# Patient Record
Sex: Female | Born: 1955 | Race: Black or African American | Hispanic: No | Marital: Single | Smoking: Current every day smoker
Health system: Southern US, Community
[De-identification: ages and names within clinical notes are randomized; demographics above are authoritative.]

## PROBLEM LIST (undated history)

## (undated) ENCOUNTER — Emergency Department (HOSPITAL_COMMUNITY): Admission: EM | Payer: Self-pay | Source: Home / Self Care

## (undated) DIAGNOSIS — M1990 Unspecified osteoarthritis, unspecified site: Secondary | ICD-10-CM

## (undated) DIAGNOSIS — J449 Chronic obstructive pulmonary disease, unspecified: Secondary | ICD-10-CM

## (undated) DIAGNOSIS — K219 Gastro-esophageal reflux disease without esophagitis: Secondary | ICD-10-CM

## (undated) DIAGNOSIS — F419 Anxiety disorder, unspecified: Secondary | ICD-10-CM

## (undated) DIAGNOSIS — K759 Inflammatory liver disease, unspecified: Secondary | ICD-10-CM

## (undated) DIAGNOSIS — I10 Essential (primary) hypertension: Secondary | ICD-10-CM

## (undated) DIAGNOSIS — F32A Depression, unspecified: Secondary | ICD-10-CM

## (undated) DIAGNOSIS — R0600 Dyspnea, unspecified: Secondary | ICD-10-CM

---

## 1997-12-06 ENCOUNTER — Emergency Department (HOSPITAL_COMMUNITY): Admission: EM | Admit: 1997-12-06 | Discharge: 1997-12-06 | Payer: Self-pay | Admitting: Emergency Medicine

## 1998-08-25 ENCOUNTER — Emergency Department (HOSPITAL_COMMUNITY): Admission: EM | Admit: 1998-08-25 | Discharge: 1998-08-25 | Payer: Self-pay | Admitting: Emergency Medicine

## 1998-11-17 ENCOUNTER — Encounter: Payer: Self-pay | Admitting: Emergency Medicine

## 1998-11-17 ENCOUNTER — Emergency Department (HOSPITAL_COMMUNITY): Admission: EM | Admit: 1998-11-17 | Discharge: 1998-11-17 | Payer: Self-pay | Admitting: Emergency Medicine

## 1999-02-14 ENCOUNTER — Other Ambulatory Visit: Admission: RE | Admit: 1999-02-14 | Discharge: 1999-02-14 | Payer: Self-pay | Admitting: Family Medicine

## 1999-07-02 ENCOUNTER — Encounter: Payer: Self-pay | Admitting: Emergency Medicine

## 1999-07-02 ENCOUNTER — Emergency Department (HOSPITAL_COMMUNITY): Admission: EM | Admit: 1999-07-02 | Discharge: 1999-07-02 | Payer: Self-pay | Admitting: Emergency Medicine

## 1999-07-29 ENCOUNTER — Emergency Department (HOSPITAL_COMMUNITY): Admission: EM | Admit: 1999-07-29 | Discharge: 1999-07-29 | Payer: Self-pay | Admitting: *Deleted

## 1999-12-12 ENCOUNTER — Encounter: Payer: Self-pay | Admitting: Emergency Medicine

## 1999-12-12 ENCOUNTER — Emergency Department (HOSPITAL_COMMUNITY): Admission: EM | Admit: 1999-12-12 | Discharge: 1999-12-12 | Payer: Self-pay | Admitting: Emergency Medicine

## 2000-06-21 ENCOUNTER — Emergency Department (HOSPITAL_COMMUNITY): Admission: EM | Admit: 2000-06-21 | Discharge: 2000-06-21 | Payer: Self-pay | Admitting: Emergency Medicine

## 2000-07-11 ENCOUNTER — Emergency Department (HOSPITAL_COMMUNITY): Admission: EM | Admit: 2000-07-11 | Discharge: 2000-07-11 | Payer: Self-pay | Admitting: Emergency Medicine

## 2000-12-20 ENCOUNTER — Encounter: Payer: Self-pay | Admitting: Emergency Medicine

## 2000-12-20 ENCOUNTER — Emergency Department (HOSPITAL_COMMUNITY): Admission: EM | Admit: 2000-12-20 | Discharge: 2000-12-20 | Payer: Self-pay | Admitting: Emergency Medicine

## 2002-04-13 ENCOUNTER — Emergency Department (HOSPITAL_COMMUNITY): Admission: EM | Admit: 2002-04-13 | Discharge: 2002-04-13 | Payer: Self-pay | Admitting: Emergency Medicine

## 2002-09-14 ENCOUNTER — Emergency Department (HOSPITAL_COMMUNITY): Admission: EM | Admit: 2002-09-14 | Discharge: 2002-09-14 | Payer: Self-pay | Admitting: Emergency Medicine

## 2002-09-14 ENCOUNTER — Encounter: Payer: Self-pay | Admitting: Emergency Medicine

## 2003-05-20 ENCOUNTER — Emergency Department (HOSPITAL_COMMUNITY): Admission: AD | Admit: 2003-05-20 | Discharge: 2003-05-20 | Payer: Self-pay | Admitting: Family Medicine

## 2003-06-08 ENCOUNTER — Emergency Department (HOSPITAL_COMMUNITY): Admission: AD | Admit: 2003-06-08 | Discharge: 2003-06-08 | Payer: Self-pay | Admitting: Family Medicine

## 2004-05-12 ENCOUNTER — Emergency Department (HOSPITAL_COMMUNITY): Admission: EM | Admit: 2004-05-12 | Discharge: 2004-05-12 | Payer: Self-pay | Admitting: Emergency Medicine

## 2004-05-14 ENCOUNTER — Emergency Department (HOSPITAL_COMMUNITY): Admission: EM | Admit: 2004-05-14 | Discharge: 2004-05-14 | Payer: Self-pay | Admitting: Family Medicine

## 2004-05-14 ENCOUNTER — Ambulatory Visit: Payer: Self-pay | Admitting: Nurse Practitioner

## 2004-08-20 ENCOUNTER — Ambulatory Visit: Payer: Self-pay | Admitting: Nurse Practitioner

## 2004-08-26 ENCOUNTER — Ambulatory Visit: Payer: Self-pay | Admitting: *Deleted

## 2004-08-26 ENCOUNTER — Ambulatory Visit: Payer: Self-pay | Admitting: Nurse Practitioner

## 2004-10-14 ENCOUNTER — Ambulatory Visit: Payer: Self-pay | Admitting: Nurse Practitioner

## 2004-11-21 ENCOUNTER — Ambulatory Visit: Payer: Self-pay | Admitting: Nurse Practitioner

## 2004-11-28 ENCOUNTER — Ambulatory Visit (HOSPITAL_COMMUNITY): Admission: RE | Admit: 2004-11-28 | Discharge: 2004-11-28 | Payer: Self-pay | Admitting: Family Medicine

## 2004-12-06 ENCOUNTER — Ambulatory Visit: Payer: Self-pay | Admitting: Nurse Practitioner

## 2005-05-15 ENCOUNTER — Ambulatory Visit: Payer: Self-pay | Admitting: Nurse Practitioner

## 2006-01-21 ENCOUNTER — Ambulatory Visit: Payer: Self-pay | Admitting: Nurse Practitioner

## 2006-01-30 ENCOUNTER — Ambulatory Visit (HOSPITAL_COMMUNITY): Admission: RE | Admit: 2006-01-30 | Discharge: 2006-01-30 | Payer: Self-pay | Admitting: Family Medicine

## 2006-03-03 ENCOUNTER — Ambulatory Visit: Payer: Self-pay | Admitting: Nurse Practitioner

## 2006-03-04 ENCOUNTER — Ambulatory Visit: Payer: Self-pay | Admitting: Nurse Practitioner

## 2006-10-27 ENCOUNTER — Ambulatory Visit: Payer: Self-pay | Admitting: Nurse Practitioner

## 2006-10-27 ENCOUNTER — Encounter (INDEPENDENT_AMBULATORY_CARE_PROVIDER_SITE_OTHER): Payer: Self-pay | Admitting: Nurse Practitioner

## 2006-12-04 ENCOUNTER — Ambulatory Visit: Payer: Self-pay | Admitting: Nurse Practitioner

## 2006-12-07 ENCOUNTER — Ambulatory Visit: Payer: Self-pay | Admitting: Internal Medicine

## 2006-12-25 ENCOUNTER — Ambulatory Visit: Payer: Self-pay | Admitting: Nurse Practitioner

## 2007-02-01 ENCOUNTER — Ambulatory Visit (HOSPITAL_COMMUNITY): Admission: RE | Admit: 2007-02-01 | Discharge: 2007-02-01 | Payer: Self-pay | Admitting: Family Medicine

## 2007-02-11 ENCOUNTER — Encounter: Admission: RE | Admit: 2007-02-11 | Discharge: 2007-02-11 | Payer: Self-pay | Admitting: Family Medicine

## 2007-03-24 ENCOUNTER — Encounter (INDEPENDENT_AMBULATORY_CARE_PROVIDER_SITE_OTHER): Payer: Self-pay | Admitting: *Deleted

## 2007-07-07 ENCOUNTER — Emergency Department (HOSPITAL_COMMUNITY): Admission: EM | Admit: 2007-07-07 | Discharge: 2007-07-07 | Payer: Self-pay | Admitting: *Deleted

## 2007-07-29 ENCOUNTER — Encounter (INDEPENDENT_AMBULATORY_CARE_PROVIDER_SITE_OTHER): Payer: Self-pay | Admitting: Nurse Practitioner

## 2007-07-29 ENCOUNTER — Ambulatory Visit: Payer: Self-pay | Admitting: Internal Medicine

## 2007-07-29 LAB — CONVERTED CEMR LAB
BUN: 17 mg/dL (ref 6–23)
CO2: 25 meq/L (ref 19–32)
Calcium: 9.5 mg/dL (ref 8.4–10.5)
Chloride: 107 meq/L (ref 96–112)
Creatinine, Ser: 0.88 mg/dL (ref 0.40–1.20)
HDL: 67 mg/dL (ref >39)
LDL Cholesterol: 99 mg/dL (ref 0–99)
Potassium: 4.6 meq/L (ref 3.5–5.3)
Total CHOL/HDL Ratio: 2.8
Triglycerides: 94 mg/dL (ref <150)

## 2007-10-06 ENCOUNTER — Ambulatory Visit: Payer: Self-pay | Admitting: Internal Medicine

## 2007-10-06 ENCOUNTER — Encounter (INDEPENDENT_AMBULATORY_CARE_PROVIDER_SITE_OTHER): Payer: Self-pay | Admitting: Nurse Practitioner

## 2007-10-06 LAB — CONVERTED CEMR LAB
ALT: 15 units/L (ref 0–35)
Albumin: 4.8 g/dL (ref 3.5–5.2)
Alkaline Phosphatase: 70 units/L (ref 39–117)
BUN: 19 mg/dL (ref 6–23)
Calcium: 9.6 mg/dL (ref 8.4–10.5)
Creatinine, Ser: 0.98 mg/dL (ref 0.40–1.20)
HDL: 75 mg/dL (ref >39)
LDL Cholesterol: 101 mg/dL — ABNORMAL HIGH (ref 0–99)
Potassium: 4.3 meq/L (ref 3.5–5.3)
Sodium: 139 meq/L (ref 135–145)
Total Bilirubin: 0.4 mg/dL (ref 0.3–1.2)
Triglycerides: 144 mg/dL (ref <150)
VLDL: 29 mg/dL (ref 0–40)

## 2008-07-06 ENCOUNTER — Ambulatory Visit: Payer: Self-pay | Admitting: Internal Medicine

## 2008-07-06 ENCOUNTER — Encounter (INDEPENDENT_AMBULATORY_CARE_PROVIDER_SITE_OTHER): Payer: Self-pay | Admitting: Internal Medicine

## 2008-07-06 LAB — CONVERTED CEMR LAB
ALT: 13 units/L (ref 0–35)
BUN: 16 mg/dL (ref 6–23)
Chloride: 106 meq/L (ref 96–112)
Cholesterol: 183 mg/dL (ref 0–200)
Potassium: 3.6 meq/L (ref 3.5–5.3)
Sodium: 142 meq/L (ref 135–145)
Total CHOL/HDL Ratio: 3
Total Protein: 7.2 g/dL (ref 6.0–8.3)
VLDL: 37 mg/dL (ref 0–40)

## 2008-07-14 ENCOUNTER — Ambulatory Visit (HOSPITAL_COMMUNITY): Admission: RE | Admit: 2008-07-14 | Discharge: 2008-07-14 | Payer: Self-pay | Admitting: Family Medicine

## 2009-03-08 ENCOUNTER — Telehealth (INDEPENDENT_AMBULATORY_CARE_PROVIDER_SITE_OTHER): Payer: Self-pay | Admitting: *Deleted

## 2009-03-09 ENCOUNTER — Ambulatory Visit: Payer: Self-pay | Admitting: Internal Medicine

## 2009-04-27 ENCOUNTER — Ambulatory Visit: Payer: Self-pay | Admitting: Internal Medicine

## 2009-05-09 ENCOUNTER — Ambulatory Visit: Payer: Self-pay | Admitting: Family Medicine

## 2009-05-23 ENCOUNTER — Ambulatory Visit: Payer: Self-pay | Admitting: Internal Medicine

## 2009-05-24 ENCOUNTER — Ambulatory Visit (HOSPITAL_COMMUNITY): Admission: RE | Admit: 2009-05-24 | Discharge: 2009-05-24 | Payer: Self-pay | Admitting: Family Medicine

## 2009-08-02 ENCOUNTER — Ambulatory Visit: Payer: Self-pay | Admitting: Internal Medicine

## 2009-11-13 ENCOUNTER — Ambulatory Visit: Payer: Self-pay | Admitting: Internal Medicine

## 2009-11-13 LAB — CONVERTED CEMR LAB
BUN: 24 mg/dL — ABNORMAL HIGH (ref 6–23)
Calcium: 9.9 mg/dL (ref 8.4–10.5)
Chloride: 103 meq/L (ref 96–112)
Creatinine, Ser: 1.08 mg/dL (ref 0.40–1.20)
Potassium: 4.2 meq/L (ref 3.5–5.3)
Sodium: 137 meq/L (ref 135–145)

## 2009-11-22 ENCOUNTER — Ambulatory Visit: Payer: Self-pay | Admitting: Internal Medicine

## 2010-01-04 ENCOUNTER — Ambulatory Visit: Payer: Self-pay | Admitting: Family Medicine

## 2010-01-21 ENCOUNTER — Ambulatory Visit: Payer: Self-pay | Admitting: Internal Medicine

## 2010-02-12 ENCOUNTER — Ambulatory Visit: Payer: Self-pay | Admitting: Internal Medicine

## 2010-07-28 ENCOUNTER — Encounter: Payer: Self-pay | Admitting: Internal Medicine
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
## 2012-01-03 ENCOUNTER — Encounter (HOSPITAL_COMMUNITY): Payer: Self-pay | Admitting: Family Medicine

## 2012-01-03 ENCOUNTER — Emergency Department (HOSPITAL_COMMUNITY)
Admission: EM | Admit: 2012-01-03 | Discharge: 2012-01-03 | Disposition: A | Discharge status: Home / Self Care | Payer: Self-pay | Attending: Emergency Medicine | Admitting: Emergency Medicine

## 2012-01-03 DIAGNOSIS — M549 Dorsalgia, unspecified: Secondary | ICD-10-CM

## 2012-01-03 DIAGNOSIS — M545 Low back pain, unspecified: Secondary | ICD-10-CM | POA: Insufficient documentation

## 2012-01-03 DIAGNOSIS — Z79899 Other long term (current) drug therapy: Secondary | ICD-10-CM | POA: Insufficient documentation

## 2012-01-03 DIAGNOSIS — X500XXA Overexertion from strenuous movement or load, initial encounter: Secondary | ICD-10-CM | POA: Insufficient documentation

## 2012-01-03 DIAGNOSIS — R350 Frequency of micturition: Secondary | ICD-10-CM | POA: Insufficient documentation

## 2012-01-03 DIAGNOSIS — F17200 Nicotine dependence, unspecified, uncomplicated: Secondary | ICD-10-CM | POA: Insufficient documentation

## 2012-01-03 DIAGNOSIS — K219 Gastro-esophageal reflux disease without esophagitis: Secondary | ICD-10-CM | POA: Insufficient documentation

## 2012-01-03 DIAGNOSIS — I10 Essential (primary) hypertension: Secondary | ICD-10-CM | POA: Insufficient documentation

## 2012-01-03 HISTORY — DX: Essential (primary) hypertension: I10

## 2012-01-03 HISTORY — DX: Gastro-esophageal reflux disease without esophagitis: K21.9

## 2012-01-03 LAB — URINALYSIS, ROUTINE W REFLEX MICROSCOPIC
Bilirubin Urine: NEGATIVE
Glucose, UA: NEGATIVE mg/dL
Hgb urine dipstick: NEGATIVE
Ketones, ur: NEGATIVE mg/dL
Nitrite: NEGATIVE
Protein, ur: NEGATIVE mg/dL
Specific Gravity, Urine: 1.016 (ref 1.005–1.030)
Urobilinogen, UA: 0.2 mg/dL (ref 0.0–1.0)
pH: 5 (ref 5.0–8.0)

## 2012-01-03 LAB — URINE MICROSCOPIC-ADD ON

## 2012-01-03 MED ORDER — TRAMADOL HCL 50 MG PO TABS: 50.0000 mg | ORAL_TABLET | Freq: Four times a day (QID) | ORAL | Status: DC | PRN

## 2012-01-03 MED ORDER — TRAMADOL HCL 50 MG PO TABS
50.0000 mg | ORAL_TABLET | Freq: Once | ORAL | Status: AC
  Administered 2012-01-03: 50 mg via ORAL
  Filled 2012-01-03: qty 1

## 2012-01-03 NOTE — Discharge Instructions (Signed)
Back Pain, Adult Low back pain is very common. About 1 in 5 people have back pain.The cause of low back pain is rarely dangerous. The pain often gets better over time.About half of people with a sudden onset of back pain feel better in just 2 weeks. About 8 in 10 people feel better by 6 weeks.  CAUSES Some common causes of back pain include:  Strain of the muscles or ligaments supporting the spine.   Wear and tear (degeneration) of the spinal discs.   Arthritis.   Direct injury to the back.  DIAGNOSIS Most of the time, the direct cause of low back pain is not known.However, back pain can be treated effectively even when the exact cause of the pain is unknown.Answering your caregiver's questions about your overall health and symptoms is one of the most accurate ways to make sure the cause of your pain is not dangerous. If your caregiver needs more information, he or she may order lab work or imaging tests (X-rays or MRIs).However, even if imaging tests show changes in your back, this usually does not require surgery. HOME CARE INSTRUCTIONS For many people, back pain returns.Since low back pain is rarely dangerous, it is often a condition that people can learn to manageon their own.   Remain active. It is stressful on the back to sit or stand in one place. Do not sit, drive, or stand in one place for more than 30 minutes at a time. Take short walks on level surfaces as soon as pain allows.Try to increase the length of time you walk each day.   Do not stay in bed.Resting more than 1 or 2 days can delay your recovery.   Do not avoid exercise or work.Your body is made to move.It is not dangerous to be active, even though your back may hurt.Your back will likely heal faster if you return to being active before your pain is gone.   Pay attention to your body when you bend and lift. Many people have less discomfortwhen lifting if they bend their knees, keep the load close to their  bodies,and avoid twisting. Often, the most comfortable positions are those that put less stress on your recovering back.   Find a comfortable position to sleep. Use a firm mattress and lie on your side with your knees slightly bent. If you lie on your back, put a pillow under your knees.   Only take over-the-counter or prescription medicines as directed by your caregiver. Over-the-counter medicines to reduce pain and inflammation are often the most helpful.Your caregiver may prescribe muscle relaxant drugs.These medicines help dull your pain so you can more quickly return to your normal activities and healthy exercise.   Put ice on the injured area.   Put ice in a plastic bag.   Place a towel between your skin and the bag.   Leave the ice on for 15 to 20 minutes, 3 to 4 times a day for the first 2 to 3 days. After that, ice and heat may be alternated to reduce pain and spasms.   Ask your caregiver about trying back exercises and gentle massage. This may be of some benefit.   Avoid feeling anxious or stressed.Stress increases muscle tension and can worsen back pain.It is important to recognize when you are anxious or stressed and learn ways to manage it.Exercise is a great option.  SEEK MEDICAL CARE IF:  You have pain that is not relieved with rest or medicine.   You have   pain that does not improve in 1 week.   You have new symptoms.   You are generally not feeling well.  SEEK IMMEDIATE MEDICAL CARE IF:   You have pain that radiates from your back into your legs.   You develop new bowel or bladder control problems.   You have unusual weakness or numbness in your arms or legs.   You develop nausea or vomiting.   You develop abdominal pain.   You feel faint.  Document Released: 06/23/2005 Document Revised: 06/12/2011 Document Reviewed: 11/11/2010 [employer] Patient Information 2012 [employer]. 

## 2012-01-03 NOTE — ED Notes (Signed)
Per pt left sided back pain and muscle spasm x1 week. sts after pushing grandchild in stroller.

## 2012-01-03 NOTE — ED Provider Notes (Addendum)
History    This chart was scribed for Anna Mcguire. Anna Lamas, MD, MD by Smitty Pluck. The patient was seen in room TR07C and the patient's care was started at 5:44PM.   CSN: [id number]  Arrival date & time 01/03/12  1723   None     Chief Complaint  Patient presents with  . Spasms    (Consider location/radiation/quality/duration/timing/severity/associated sxs/prior treatment) The history is provided by the patient.   Anna Mcguire is a 56 y.o. female who presents to the Emergency Department complaining of moderate left side back pain onset 8 days ago. Pt reports taking tramadol without relief. Pt reports that she takes 1/day for other pain. Pt reports that she has had increased urinary frequency. Pt reports that she was pushing her grandchildren in stroller before symptoms started about 1 week ago. Symptoms have been constant since onset without radiation. She endorses some urinary freq, but no dysuria.  No abd pain.  No weakness or dizziness.  Gradual onset of pain.    Past Medical History  Diagnosis Date  . Hypertension   . Acid reflux     History reviewed. No pertinent past surgical history.  History reviewed. No pertinent family history.  History  Substance Use Topics  . Smoking status: Current Everyday Smoker  . Smokeless tobacco: Not on file  . Alcohol Use: Yes    OB History    Grav Para Term Preterm Abortions TAB SAB Ect Mult Living                  Review of Systems  Constitutional: Negative for fever and chills.  Respiratory: Negative for cough and shortness of breath.   Gastrointestinal: Negative for nausea, vomiting and diarrhea.  Genitourinary: Positive for frequency. Negative for dysuria and urgency.  Musculoskeletal: Positive for back pain. Negative for gait problem.  Skin: Negative for rash.  Neurological: Negative for weakness, numbness and headaches.    Allergies  Review of patient's allergies indicates no known allergies.  Home Medications   Current  Outpatient Rx  Name Route Sig Dispense Refill  . HYDROCHLOROTHIAZIDE 25 MG PO TABS Oral Take 25 mg by mouth daily.    Marland Kitchen LORATADINE 10 MG PO TABS Oral Take 10 mg by mouth daily.    . MELOXICAM 15 MG PO TABS Oral Take 15 mg by mouth daily.    Marland Kitchen PANTOPRAZOLE SODIUM 40 MG PO TBEC Oral Take 40 mg by mouth daily.    . TRAMADOL HCL 50 MG PO TABS Oral Take 50 mg by mouth every 6 (six) hours as needed. For pain    . TRAMADOL HCL 50 MG PO TABS Oral Take 1 tablet (50 mg total) by mouth every 6 (six) hours as needed for pain. 15 tablet 0    BP 143/83  Pulse 89  Temp 98.5 F (36.9 C) (Oral)  Resp 17  SpO2 100%  Physical Exam  Nursing note and vitals reviewed. Constitutional: She is oriented to person, place, and time. She appears well-developed and well-nourished. No distress.  HENT:  Head: Normocephalic and atraumatic.  Eyes: EOM are normal. Pupils are equal, round, and reactive to light.  Neck: Normal range of motion. Neck supple.  Pulmonary/Chest: Effort normal. No respiratory distress.  Abdominal: Soft. She exhibits no distension. There is no tenderness.  Musculoskeletal: Normal range of motion. She exhibits tenderness ( right lower back).       Lumbar back: She exhibits pain. She exhibits normal range of motion, no bony tenderness, no deformity, no  laceration, no spasm and normal pulse.       No spasm +1 reflexes in knees bilaterally  Nl gait    Neurological: She is alert and oriented to person, place, and time. She has normal strength. Gait normal.  Reflex Scores:      Patellar reflexes are 1+ on the right side and 1+ on the left side. Skin: Skin is warm and dry. No rash noted.  Psychiatric: She has a normal mood and affect. Her behavior is normal.    ED Course  Procedures (including critical care time) DIAGNOSTIC STUDIES: Oxygen Saturation is 100% on room air, normal by my interpretation.    COORDINATION OF CARE: 5:49PM EDP discusses pt ED treatment with pt.  5:49PM EDP  orders medication: ultram 50 mg  Labs Reviewed  URINALYSIS, ROUTINE W REFLEX MICROSCOPIC - Abnormal; Notable for the following:    APPearance CLOUDY (*)     Leukocytes, UA TRACE (*)     All other components within normal limits  URINE MICROSCOPIC-ADD ON - Abnormal; Notable for the following:    Squamous Epithelial / LPF FEW (*)     All other components within normal limits   No results found.   1. Back pain     6:41 PM UA shows no evidence of UTI.  MDM  I personally performed the services described in this documentation, which was scribed in my presence. The recorded information has been reviewed and considered.        Anna Mcguire. Anna Lamas, MD 01/03/12 1823  Anna Mcguire. Anna Lamas, MD 01/03/12 1610

## 2012-01-07 ENCOUNTER — Emergency Department (HOSPITAL_COMMUNITY)
Admission: EM | Admit: 2012-01-07 | Discharge: 2012-01-07 | Disposition: A | Discharge status: Home / Self Care | Payer: Self-pay | Attending: Emergency Medicine | Admitting: Emergency Medicine

## 2012-01-07 ENCOUNTER — Encounter (HOSPITAL_COMMUNITY): Payer: Self-pay

## 2012-01-07 ENCOUNTER — Emergency Department (HOSPITAL_COMMUNITY): Payer: Self-pay

## 2012-01-07 DIAGNOSIS — F17200 Nicotine dependence, unspecified, uncomplicated: Secondary | ICD-10-CM | POA: Insufficient documentation

## 2012-01-07 DIAGNOSIS — K219 Gastro-esophageal reflux disease without esophagitis: Secondary | ICD-10-CM | POA: Insufficient documentation

## 2012-01-07 DIAGNOSIS — I10 Essential (primary) hypertension: Secondary | ICD-10-CM | POA: Insufficient documentation

## 2012-01-07 DIAGNOSIS — M62838 Other muscle spasm: Secondary | ICD-10-CM | POA: Insufficient documentation

## 2012-01-07 DIAGNOSIS — R109 Unspecified abdominal pain: Secondary | ICD-10-CM | POA: Insufficient documentation

## 2012-01-07 LAB — CBC WITH DIFFERENTIAL/PLATELET
Basophils Absolute: 0 10*3/uL (ref 0.0–0.1)
Basophils Relative: 1 % (ref 0–1)
Eosinophils Absolute: 0.2 10*3/uL (ref 0.0–0.7)
Eosinophils Relative: 3 % (ref 0–5)
HCT: 36.6 % (ref 36.0–46.0)
Hemoglobin: 12.8 g/dL (ref 12.0–15.0)
Lymphs Abs: 2.7 10*3/uL (ref 0.7–4.0)
MCH: 32.4 pg (ref 26.0–34.0)
MCV: 92.7 fL (ref 78.0–100.0)
Monocytes Relative: 7 % (ref 3–12)
Neutro Abs: 3 10*3/uL (ref 1.7–7.7)
RBC: 3.95 MIL/uL (ref 3.87–5.11)
WBC: 6.3 10*3/uL (ref 4.0–10.5)

## 2012-01-07 LAB — POCT I-STAT, CHEM 8
BUN: 22 mg/dL (ref 6–23)
Calcium, Ion: 1.39 mmol/L — ABNORMAL HIGH (ref 1.12–1.32)
Glucose, Bld: 83 mg/dL (ref 70–99)
HCT: 40 % (ref 36.0–46.0)
TCO2: 28 mmol/L (ref 0–100)

## 2012-01-07 LAB — URINALYSIS, ROUTINE W REFLEX MICROSCOPIC
Glucose, UA: NEGATIVE mg/dL
Leukocytes, UA: NEGATIVE
Nitrite: NEGATIVE
Specific Gravity, Urine: 1.011 (ref 1.005–1.030)
pH: 5 (ref 5.0–8.0)

## 2012-01-07 MED ORDER — HYDROCODONE-ACETAMINOPHEN 5-325 MG PO TABS: 1.0000 | ORAL_TABLET | Freq: Four times a day (QID) | ORAL | Status: AC | PRN

## 2012-01-07 MED ORDER — OXYCODONE-ACETAMINOPHEN 5-325 MG PO TABS: 1.0000 | ORAL_TABLET | Freq: Once | ORAL | Status: DC

## 2012-01-07 MED ORDER — CYCLOBENZAPRINE HCL 10 MG PO TABS: 10.0000 mg | ORAL_TABLET | Freq: Two times a day (BID) | ORAL | Status: AC | PRN

## 2012-01-07 MED ORDER — HYDROCODONE-ACETAMINOPHEN 5-325 MG PO TABS
1.0000 | ORAL_TABLET | Freq: Once | ORAL | Status: AC
  Administered 2012-01-07: 1 via ORAL
  Filled 2012-01-07: qty 1

## 2012-01-07 NOTE — ED Notes (Signed)
Pt continues to complain of pain to the right side. Pt A/A/Ox4, skin is warm and dry, respiration is even and unlabored.

## 2012-01-07 NOTE — ED Notes (Signed)
Pt presents with 2 week h/o R flank pain.  Pt reports pain has been constant and does not radiate.  Pt reports urinary frequency, denies any dysuria.  Pt seen here for same last week.

## 2012-01-07 NOTE — ED Provider Notes (Signed)
History     CSN: [id number]  Arrival date & time 01/07/12  1259   First MD Initiated Contact with Patient 01/07/12 1646      Chief Complaint  Patient presents with  . Flank Pain    (Consider location/radiation/quality/duration/timing/severity/associated sxs/prior treatment) HPI Comments: Patient is a current everyday smoker with a significant past medical history for hypertension that presents emergency department chief complaint of right-sided flank pain.  Onset of symptoms were 2 weeks ago and patient was evaluated last week for same.  Patient has been taking tramadol, however she states that this has not helped.  In addition she has used cold and heat therapy which hurts as well.  Pain is described as sharp and constant.  Patient is followed by Dr. Andrey Campanile.  Patient denies any cough, fever, night sweats, chills, shortness of breath, chest pain, abdominal pain, and but does report urinary frequency  Patient is a 56 y.o. female presenting with flank pain. The history is provided by the patient.  Flank Pain Pertinent negatives include no abdominal pain, chest pain, chills, congestion, fever, headaches, numbness or weakness.    Past Medical History  Diagnosis Date  . Hypertension   . Acid reflux     History reviewed. No pertinent past surgical history.  No family history on file.  History  Substance Use Topics  . Smoking status: Current Everyday Smoker  . Smokeless tobacco: Not on file  . Alcohol Use: Yes    OB History    Grav Para Term Preterm Abortions TAB SAB Ect Mult Living                  Review of Systems  Constitutional: Negative for fever, chills and appetite change.  HENT: Negative for congestion.   Eyes: Negative for visual disturbance.  Respiratory: Negative for shortness of breath.   Cardiovascular: Negative for chest pain and leg swelling.  Gastrointestinal: Negative for abdominal pain.  Genitourinary: Positive for flank pain. Negative for dysuria,  urgency and frequency.  Neurological: Negative for dizziness, syncope, weakness, light-headedness, numbness and headaches.  Psychiatric/Behavioral: Negative for confusion.  All other systems reviewed and are negative.    Allergies  Review of patient's allergies indicates no known allergies.  Home Medications   Current Outpatient Rx  Name Route Sig Dispense Refill  . HYDROCHLOROTHIAZIDE 25 MG PO TABS Oral Take 25 mg by mouth daily.    Marland Kitchen LORATADINE 10 MG PO TABS Oral Take 10 mg by mouth daily.    . MELOXICAM 15 MG PO TABS Oral Take 15 mg by mouth daily.    Marland Kitchen PANTOPRAZOLE SODIUM 40 MG PO TBEC Oral Take 40 mg by mouth daily.    . TRAMADOL HCL 50 MG PO TABS Oral Take 50 mg by mouth 3 (three) times daily as needed. For pain      BP 160/92  Pulse 78  Temp 98.6 F (37 C) (Oral)  Resp 16  SpO2 100%  Physical Exam  Constitutional: She is oriented to person, place, and time. She appears well-developed and well-nourished. No distress.  HENT:  Head: Normocephalic and atraumatic.  Mouth/Throat: Oropharynx is clear and moist. No oropharyngeal exudate.  Eyes: Conjunctivae and EOM are normal. Pupils are equal, round, and reactive to light. No scleral icterus.  Neck: Normal range of motion. Neck supple. No tracheal deviation present. No thyromegaly present.  Cardiovascular: Normal rate, regular rhythm, normal heart sounds and intact distal pulses.   Pulmonary/Chest: Effort normal and breath sounds normal. No stridor. No  respiratory distress. She has no wheezes.  Abdominal: Soft.  Musculoskeletal: Normal range of motion. She exhibits tenderness. She exhibits no edema.       Arms: Neurological: She is alert and oriented to person, place, and time. Coordination normal.  Skin: Skin is warm and dry. No rash noted. She is not diaphoretic. No erythema. No pallor.  Psychiatric: She has a normal mood and affect. Her behavior is normal.    ED Course  Procedures (including critical care  time)  Labs Reviewed  POCT I-STAT, CHEM 8 - Abnormal; Notable for the following:    Creatinine, Ser 1.30 (*)     Calcium, Ion 1.39 (*)     All other components within normal limits  URINALYSIS, ROUTINE W REFLEX MICROSCOPIC  CBC WITH DIFFERENTIAL   Ct Abdomen Pelvis Wo Contrast  01/07/2012  *RADIOLOGY REPORT*  Clinical Data: Right flank pain for past week becoming worse.  No hematuria.  CT ABDOMEN AND PELVIS WITHOUT CONTRAST  Technique:  Multidetector CT imaging of the abdomen and pelvis was performed following the standard protocol without intravenous contrast.  Comparison: None.  Findings: No renal or ureteral calculi or findings to suggest renal collecting system obstruction.  Unenhanced imaging without renal mass or bladder mass identified.  No extra luminal bowel inflammatory process, free fluid or free air.  The stomach is under distended and evaluation limited.  Evaluation of solid abdominal viscera is limited by lack of IV contrast.  Taking this limitation into account no focal hepatic, on the splenic, pancreatic or adrenal mass.  No calcified gallstones.  Atherosclerotic type changes of the aorta and branch vessels with aortic ectasia.  Uterus tilted to the left.  No adenopathy.  Degenerative changes lumbar spine most notable L4-5 level.  Lung bases clear.  No bony destructive lesion.  IMPRESSION:  No renal or ureteral calculi or findings to suggest renal collecting system obstruction.  Unenhanced imaging without renal mass or bladder mass identified.  No extra luminal bowel inflammatory process, free fluid or free air.  The stomach is under distended and evaluation limited.  Atherosclerotic type changes of the aorta and branch vessels.  Degenerative changes lumbar spine most notable L4-5 level.  Original Report Authenticated By: Fuller Canada, M.D.     No diagnosis found.    MDM  musc pain, spasm   Patient given Flexeril and Norco for pain management.  Patient does not drive and is  aware that these medications will increase her fall risk. Advised patient to followup with primary care physician.  Labs and imaging reviewed.  Mild bump in patient's creatinine discussed.  Patient is agreeable with plan to followup with her PCP.  Patient verbalizes understanding        Jaci Carrel, [place] 01/07/12 1846

## 2012-01-07 NOTE — ED Notes (Signed)
Pt. Placed in gown and resting on bed.

## 2012-01-07 NOTE — ED Notes (Signed)
Complains of back pain after pushing grandson in stroller.  States "has tramadol but it has not provided relief of the pain".  Denies urinary symptoms.

## 2012-01-12 NOTE — ED Provider Notes (Signed)
Medical screening examination/treatment/procedure(s) were performed by non-physician practitioner and as supervising physician I was immediately available for consultation/collaboration.  Yailyn Strack, MD 01/12/12 0726 

## 2012-05-01 ENCOUNTER — Encounter (HOSPITAL_COMMUNITY): Payer: Self-pay | Admitting: Emergency Medicine

## 2012-05-01 DIAGNOSIS — Z8719 Personal history of other diseases of the digestive system: Secondary | ICD-10-CM | POA: Insufficient documentation

## 2012-05-01 DIAGNOSIS — F17200 Nicotine dependence, unspecified, uncomplicated: Secondary | ICD-10-CM | POA: Insufficient documentation

## 2012-05-01 DIAGNOSIS — I10 Essential (primary) hypertension: Secondary | ICD-10-CM | POA: Insufficient documentation

## 2012-05-01 DIAGNOSIS — Z79899 Other long term (current) drug therapy: Secondary | ICD-10-CM | POA: Insufficient documentation

## 2012-05-01 DIAGNOSIS — S0990XA Unspecified injury of head, initial encounter: Secondary | ICD-10-CM | POA: Insufficient documentation

## 2012-05-01 DIAGNOSIS — S0180XA Unspecified open wound of other part of head, initial encounter: Secondary | ICD-10-CM | POA: Insufficient documentation

## 2012-05-01 DIAGNOSIS — Z8739 Personal history of other diseases of the musculoskeletal system and connective tissue: Secondary | ICD-10-CM | POA: Insufficient documentation
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
## 2012-05-02 ENCOUNTER — Emergency Department (HOSPITAL_COMMUNITY)
Admission: EM | Admit: 2012-05-02 | Discharge: 2012-05-02 | Disposition: A | Discharge status: Home / Self Care | Payer: Self-pay | Attending: Emergency Medicine | Admitting: Emergency Medicine

## 2012-05-02 DIAGNOSIS — S0990XA Unspecified injury of head, initial encounter: Secondary | ICD-10-CM

## 2012-05-02 DIAGNOSIS — S0181XA Laceration without foreign body of other part of head, initial encounter: Secondary | ICD-10-CM

## 2012-05-02 HISTORY — DX: Unspecified osteoarthritis, unspecified site: M19.90
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
## 2012-05-10 NOTE — ED Provider Notes (Signed)
Medical screening examination/treatment/procedure(s) were performed by non-physician practitioner and as supervising physician I was immediately available for consultation/collaboration.   Gavin Pound. Oletta Lamas, MD 05/10/12 2022

## 2013-04-18 ENCOUNTER — Ambulatory Visit (HOSPITAL_COMMUNITY)
Admission: RE | Admit: 2013-04-18 | Discharge: 2013-04-18 | Disposition: A | Discharge status: Home / Self Care | Payer: No Typology Code available for payment source | Source: Ambulatory Visit | Attending: Internal Medicine | Admitting: Internal Medicine

## 2013-04-18 ENCOUNTER — Other Ambulatory Visit (HOSPITAL_COMMUNITY): Payer: Self-pay | Admitting: Internal Medicine

## 2013-04-18 DIAGNOSIS — R52 Pain, unspecified: Secondary | ICD-10-CM

## 2013-04-18 DIAGNOSIS — M25569 Pain in unspecified knee: Secondary | ICD-10-CM | POA: Insufficient documentation

## 2014-01-31 ENCOUNTER — Encounter (HOSPITAL_COMMUNITY): Payer: Self-pay | Admitting: Emergency Medicine

## 2014-01-31 ENCOUNTER — Emergency Department (HOSPITAL_COMMUNITY)
Admission: EM | Admit: 2014-01-31 | Discharge: 2014-01-31 | Disposition: A | Discharge status: Home / Self Care | Payer: No Typology Code available for payment source | Attending: Emergency Medicine | Admitting: Emergency Medicine

## 2014-01-31 DIAGNOSIS — IMO0002 Reserved for concepts with insufficient information to code with codable children: Secondary | ICD-10-CM | POA: Insufficient documentation

## 2014-01-31 DIAGNOSIS — Z792 Long term (current) use of antibiotics: Secondary | ICD-10-CM | POA: Insufficient documentation

## 2014-01-31 DIAGNOSIS — K219 Gastro-esophageal reflux disease without esophagitis: Secondary | ICD-10-CM | POA: Insufficient documentation

## 2014-01-31 DIAGNOSIS — Z791 Long term (current) use of non-steroidal anti-inflammatories (NSAID): Secondary | ICD-10-CM | POA: Insufficient documentation

## 2014-01-31 DIAGNOSIS — F17200 Nicotine dependence, unspecified, uncomplicated: Secondary | ICD-10-CM | POA: Insufficient documentation

## 2014-01-31 DIAGNOSIS — Z79899 Other long term (current) drug therapy: Secondary | ICD-10-CM | POA: Insufficient documentation

## 2014-01-31 DIAGNOSIS — N898 Other specified noninflammatory disorders of vagina: Secondary | ICD-10-CM | POA: Insufficient documentation

## 2014-01-31 DIAGNOSIS — M129 Arthropathy, unspecified: Secondary | ICD-10-CM | POA: Insufficient documentation

## 2014-01-31 DIAGNOSIS — I10 Essential (primary) hypertension: Secondary | ICD-10-CM | POA: Insufficient documentation

## 2014-01-31 LAB — URINALYSIS, ROUTINE W REFLEX MICROSCOPIC
Bilirubin Urine: NEGATIVE
Glucose, UA: NEGATIVE mg/dL
Hgb urine dipstick: NEGATIVE
KETONES UR: NEGATIVE mg/dL
LEUKOCYTES UA: NEGATIVE
NITRITE: NEGATIVE
PROTEIN: NEGATIVE mg/dL
Specific Gravity, Urine: 1.023 (ref 1.005–1.030)
Urobilinogen, UA: 0.2 mg/dL (ref 0.0–1.0)
pH: 5 (ref 5.0–8.0)

## 2014-01-31 LAB — BASIC METABOLIC PANEL
ANION GAP: 12 (ref 5–15)
BUN: 21 mg/dL (ref 6–23)
CALCIUM: 9.9 mg/dL (ref 8.4–10.5)
CO2: 24 mEq/L (ref 19–32)
Chloride: 105 mEq/L (ref 96–112)
Creatinine, Ser: 1.06 mg/dL (ref 0.50–1.10)
GFR calc Af Amer: 65 mL/min — ABNORMAL LOW (ref >90)
GFR, EST NON AFRICAN AMERICAN: 56 mL/min — AB (ref >90)
Glucose, Bld: 158 mg/dL — ABNORMAL HIGH (ref 70–99)
Potassium: 4.1 mEq/L (ref 3.7–5.3)
SODIUM: 141 meq/L (ref 137–147)

## 2014-01-31 LAB — CBC
HCT: 34.1 % — ABNORMAL LOW (ref 36.0–46.0)
Hemoglobin: 11.2 g/dL — ABNORMAL LOW (ref 12.0–15.0)
MCH: 31.3 pg (ref 26.0–34.0)
MCHC: 32.8 g/dL (ref 30.0–36.0)
MCV: 95.3 fL (ref 78.0–100.0)
PLATELETS: 292 10*3/uL (ref 150–400)
RBC: 3.58 MIL/uL — AB (ref 3.87–5.11)
RDW: 12.3 % (ref 11.5–15.5)
WBC: 3.6 10*3/uL — ABNORMAL LOW (ref 4.0–10.5)

## 2014-01-31 LAB — WET PREP, GENITAL
Trich, Wet Prep: NONE SEEN
Yeast Wet Prep HPF POC: NONE SEEN

## 2014-01-31 MED ORDER — CEFTRIAXONE SODIUM 250 MG IJ SOLR
250.0000 mg | Freq: Once | INTRAMUSCULAR | Status: AC
  Administered 2014-01-31: 250 mg via INTRAMUSCULAR
  Filled 2014-01-31: qty 250

## 2014-01-31 MED ORDER — DOXYCYCLINE HYCLATE 100 MG PO TABS
100.0000 mg | ORAL_TABLET | Freq: Once | ORAL | Status: AC
  Administered 2014-01-31: 100 mg via ORAL
  Filled 2014-01-31: qty 1

## 2014-01-31 MED ORDER — DOXYCYCLINE HYCLATE 100 MG PO CAPS: 100.0000 mg | ORAL_CAPSULE | Freq: Two times a day (BID) | ORAL | Status: DC

## 2014-01-31 NOTE — ED Provider Notes (Signed)
CSN: [id number]     Arrival date & time 01/31/14  1255 History   First MD Initiated Contact with Patient 01/31/14 1700     Chief Complaint  Patient presents with  . Vaginal Bleeding     (Consider location/radiation/quality/duration/timing/severity/associated sxs/prior Treatment) Patient is a 58 y.o. female presenting with vaginal discharge.  Vaginal Discharge Quality:  Bloody Severity:  Mild Onset quality:  Sudden Duration:  1 week Timing:  Constant Associated symptoms: abdominal pain   Associated symptoms: no dysuria, no nausea and no vomiting     Past Medical History  Diagnosis Date  . Hypertension   . Acid reflux   . Arthritis    History reviewed. No pertinent past surgical history. History reviewed. No pertinent family history. History  Substance Use Topics  . Smoking status: Current Every Day Smoker  . Smokeless tobacco: Not on file  . Alcohol Use: Yes   OB History   Grav Para Term Preterm Abortions TAB SAB Ect Mult Living                 Review of Systems  Constitutional: Negative for activity change.  HENT: Negative for congestion.   Respiratory: Negative for cough and shortness of breath.   Cardiovascular: Negative for chest pain and leg swelling.  Gastrointestinal: Positive for abdominal pain. Negative for nausea, vomiting, diarrhea, constipation, blood in stool and abdominal distention.  Genitourinary: Positive for vaginal bleeding and vaginal discharge. Negative for dysuria and flank pain.  Musculoskeletal: Negative for back pain.  Skin: Negative for color change.  Neurological: Negative for syncope and headaches.  Psychiatric/Behavioral: Negative for agitation.      Allergies  Review of patient's allergies indicates no known allergies.  Home Medications   Prior to Admission medications   Medication Sig Start Date End Date Taking? Authorizing Provider  fluticasone (FLONASE) 50 MCG/ACT nasal spray Place 1 spray into both nostrils daily.   Yes  Historical Provider, MD  lisinopril-hydrochlorothiazide (PRINZIDE,ZESTORETIC) 20-12.5 MG per tablet Take 1 tablet by mouth daily.   Yes Historical Provider, MD  lovastatin (MEVACOR) 20 MG tablet Take 20 mg by mouth daily at 6 PM.   Yes Historical Provider, MD  meloxicam (MOBIC) 15 MG tablet Take 15 mg by mouth daily.   Yes Historical Provider, MD  pantoprazole (PROTONIX) 40 MG tablet Take 40 mg by mouth daily.   Yes Historical Provider, MD  azithromycin (ZITHROMAX Z-PAK) 250 MG tablet Take 250 mg by mouth daily. 5 day course (7/20-7/24) 01/23/14   Historical Provider, MD   BP 148/88  Pulse 69  Temp(Src) 98.6 F (37 C) (Oral)  Resp 16  SpO2 100% Physical Exam  Constitutional: She is oriented to person, place, and time. She appears well-developed.  HENT:  Head: Normocephalic.  Eyes: Pupils are equal, round, and reactive to light.  Neck: Neck supple.  Cardiovascular: Normal rate.  Exam reveals no gallop and no friction rub.   No murmur heard. Pulmonary/Chest: Effort normal and breath sounds normal. No respiratory distress.  Abdominal: Soft. She exhibits no distension. There is tenderness. There is no rebound.  Diffuse abdominal tenderness  Genitourinary:  Note and the vault, white vaginal discharge noted, no CVA tenderness, no adnexal tenderness  Musculoskeletal: She exhibits no edema.  Neurological: She is alert and oriented to person, place, and time.  Skin: Skin is warm.  Psychiatric: She has a normal mood and affect.   Cranial nerves III-XII grossly intact Strength 5+/5+ to upper and lower extremities bilaterally with resistance applied, equal  distribution noted Strength intact to MCP, PIP, DIP joints of  hand Negative arm drift Fine motor skills intact Heel to knee down shin normal bilaterally Gait proper, proper balance - negative sway, negative drift, negative step-offs     ED Course  Procedures (including critical care time) Labs Review Labs Reviewed  CBC - Abnormal;  Notable for the following:    WBC 3.6 (*)    RBC 3.58 (*)    Hemoglobin 11.2 (*)    HCT 34.1 (*)    All other components within normal limits  BASIC METABOLIC PANEL - Abnormal; Notable for the following:    Glucose, Bld 158 (*)    GFR calc non Af Amer 56 (*)    GFR calc Af Amer 65 (*)    All other components within normal limits  URINALYSIS, ROUTINE W REFLEX MICROSCOPIC    Imaging Review No results found.   EKG Interpretation None      MDM   Final diagnoses:  Vaginal discharge   58 year old female who presents with vaginal discharge. Patient states that she only has one sexual partner however she is unsure if he is faithful. She started having light vaginal bleeding one week prior although she has undergone menopause. Patient states she's also been having cramping abdominal pain that is diffuse in nature. No focality to the pain unlikely intra-abdominal process. Vaginal exam patient does not have blood in the vault is to have vaginal discharge. Wet prep is unremarkable. Patient prophylactically treated for PID with ceftriaxone and doxycycline. GC swabs obtained. Patient discharged home after the importance of family followup was emphasized.    Clement Sayres, MD 02/02/14 [phone]

## 2014-01-31 NOTE — ED Notes (Signed)
She states shes had light vaginal bleeding this week. She reports she is 10 years post menopause. She states her head and stomach have been hurting this week also

## 2014-01-31 NOTE — Discharge Instructions (Signed)
Follow-up with PCP for concern of vaginal bleeding after menopause

## 2014-02-01 LAB — HIV ANTIBODY (ROUTINE TESTING W REFLEX): HIV: NONREACTIVE

## 2014-02-01 LAB — GC/CHLAMYDIA PROBE AMP
CT Probe RNA: NEGATIVE
GC PROBE AMP APTIMA: NEGATIVE

## 2014-02-01 LAB — RPR

## 2014-02-02 NOTE — ED Provider Notes (Signed)
Medical screening examination/treatment/procedure(s) were conducted as a shared visit with resident-physician practitioner(s) and myself.  I personally evaluated the patient during the encounter.  Pt is a 58 y.o. female with pmhx as above presenting with vaginal d/c, unprotected sex.  She is non-toxic. Will treat presumptively for PID.    Shanna CiscoMegan E Docherty, MD 02/02/14 [phone]

## 2014-03-02 ENCOUNTER — Ambulatory Visit: Payer: No Typology Code available for payment source | Attending: Podiatry

## 2014-05-29 NOTE — Progress Notes (Signed)
Called patient 05-24-14, 05-25-14, 05-26-14 and 05-29-14 numbers will not accept incoming calls

## 2014-05-30 ENCOUNTER — Encounter (HOSPITAL_COMMUNITY): Payer: Self-pay | Admitting: *Deleted

## 2014-05-30 MED ORDER — PROPOFOL 10 MG/ML IV BOLUS
INTRAVENOUS | Status: AC
  Filled 2014-05-30: qty 20

## 2014-06-06 ENCOUNTER — Other Ambulatory Visit: Payer: Self-pay | Admitting: Gastroenterology

## 2014-06-06 NOTE — Progress Notes (Signed)
Left message 05-31-14 and 06-05-14 with cousin for pt to call

## 2014-06-06 NOTE — Addendum Note (Signed)
Addended by: Willis ModenaUTLAW, Thurma Priego on: 06/06/2014 12:49 PM   Modules accepted: Orders

## 2014-06-07 ENCOUNTER — Ambulatory Visit (HOSPITAL_COMMUNITY): Payer: Self-pay | Admitting: Anesthesiology

## 2014-06-07 ENCOUNTER — Encounter (HOSPITAL_COMMUNITY)
Admission: RE | Disposition: A | Discharge status: Home / Self Care | Payer: Self-pay | Source: Ambulatory Visit | Attending: Gastroenterology

## 2014-06-07 ENCOUNTER — Ambulatory Visit (HOSPITAL_COMMUNITY)
Admission: RE | Admit: 2014-06-07 | Discharge: 2014-06-07 | Disposition: A | Discharge status: Home / Self Care | Payer: No Typology Code available for payment source | Source: Ambulatory Visit | Attending: Gastroenterology | Admitting: Gastroenterology

## 2014-06-07 ENCOUNTER — Ambulatory Visit (HOSPITAL_COMMUNITY): Admit: 2014-06-07 | Payer: Self-pay | Admitting: Gastroenterology

## 2014-06-07 ENCOUNTER — Ambulatory Visit (HOSPITAL_COMMUNITY): Payer: No Typology Code available for payment source | Admitting: Anesthesiology

## 2014-06-07 ENCOUNTER — Encounter (HOSPITAL_COMMUNITY): Payer: Self-pay | Admitting: *Deleted

## 2014-06-07 DIAGNOSIS — R634 Abnormal weight loss: Secondary | ICD-10-CM | POA: Insufficient documentation

## 2014-06-07 DIAGNOSIS — K921 Melena: Secondary | ICD-10-CM | POA: Insufficient documentation

## 2014-06-07 DIAGNOSIS — K648 Other hemorrhoids: Secondary | ICD-10-CM | POA: Insufficient documentation

## 2014-06-07 DIAGNOSIS — K219 Gastro-esophageal reflux disease without esophagitis: Secondary | ICD-10-CM | POA: Insufficient documentation

## 2014-06-07 DIAGNOSIS — K5730 Diverticulosis of large intestine without perforation or abscess without bleeding: Secondary | ICD-10-CM | POA: Insufficient documentation

## 2014-06-07 HISTORY — PX: ESOPHAGOGASTRODUODENOSCOPY (EGD) WITH PROPOFOL: SHX5813

## 2014-06-07 HISTORY — PX: COLONOSCOPY WITH PROPOFOL: SHX5780

## 2014-06-07 SURGERY — Surgical Case
Anesthesia: *Unknown

## 2014-06-07 SURGERY — ESOPHAGOGASTRODUODENOSCOPY (EGD) WITH PROPOFOL
Anesthesia: Monitor Anesthesia Care

## 2014-06-07 MED ORDER — PROPOFOL 10 MG/ML IV BOLUS
INTRAVENOUS | Status: AC
  Filled 2014-06-07: qty 20

## 2014-06-07 MED ORDER — BUTAMBEN-TETRACAINE-BENZOCAINE 2-2-14 % EX AERO
INHALATION_SPRAY | CUTANEOUS | Status: DC | PRN
Start: 2014-06-07 — End: 2014-06-07
  Administered 2014-06-07: 2 via TOPICAL

## 2014-06-07 MED ORDER — SODIUM CHLORIDE 0.9 % IV SOLN: INTRAVENOUS | Status: DC

## 2014-06-07 MED ORDER — LACTATED RINGERS IV SOLN
INTRAVENOUS | Status: DC
  Administered 2014-06-07: 1000 mL via INTRAVENOUS

## 2014-06-07 MED ORDER — PROMETHAZINE HCL 25 MG/ML IJ SOLN: 6.2500 mg | INTRAMUSCULAR | Status: DC | PRN

## 2014-06-07 MED ORDER — PROPOFOL INFUSION 10 MG/ML OPTIME
INTRAVENOUS | Status: DC | PRN
  Administered 2014-06-07: 200 ug/kg/min via INTRAVENOUS

## 2014-06-07 MED ORDER — LIDOCAINE HCL (CARDIAC) 20 MG/ML IV SOLN
INTRAVENOUS | Status: DC | PRN
  Administered 2014-06-07: 50 mg via INTRAVENOUS

## 2014-06-07 MED ORDER — LIDOCAINE HCL (CARDIAC) 20 MG/ML IV SOLN
INTRAVENOUS | Status: AC
  Filled 2014-06-07: qty 5

## 2014-06-07 SURGICAL SUPPLY — 24 items

## 2014-06-07 NOTE — Anesthesia Preprocedure Evaluation (Addendum)
Anesthesia Evaluation  Patient identified by MRN, date of birth, ID band Patient awake    Reviewed: Allergy & Precautions, H&P , NPO status , Patient's Chart, lab work & pertinent test results  Airway Mallampati: II  TM Distance: >3 FB Neck ROM: Full    Dental no notable dental hx.    Pulmonary Current Smoker,  breath sounds clear to auscultation  Pulmonary exam normal       Cardiovascular hypertension, negative cardio ROS  Rhythm:Regular Rate:Normal     Neuro/Psych negative neurological ROS  negative psych ROS   GI/Hepatic GERD-  Medicated,(+) Hepatitis -, C  Endo/Other  negative endocrine ROS  Renal/GU negative Renal ROS  negative genitourinary   Musculoskeletal negative musculoskeletal ROS (+)   Abdominal   Peds negative pediatric ROS (+)  Hematology negative hematology ROS (+)   Anesthesia Other Findings   Reproductive/Obstetrics negative OB ROS                            Anesthesia Physical Anesthesia Plan  ASA: II  Anesthesia Plan: MAC   Post-op Pain Management:    Induction: Intravenous  Airway Management Planned: Nasal Cannula  Additional Equipment:   Intra-op Plan:   Post-operative Plan:   Informed Consent: I have reviewed the patients History and Physical, chart, labs and discussed the procedure including the risks, benefits and alternatives for the proposed anesthesia with the patient or authorized representative who has indicated his/her understanding and acceptance.   Dental advisory given  Plan Discussed with: CRNA and Surgeon  Anesthesia Plan Comments:         Anesthesia Quick Evaluation

## 2014-06-07 NOTE — Discharge Instructions (Signed)
Endoscopy Care After Please read the instructions outlined below and refer to this sheet in the next few weeks. These discharge instructions provide you with general information on caring for yourself after you leave the hospital. Your doctor may also give you specific instructions. While your treatment has been planned according to the most current medical practices available, unavoidable complications occasionally occur. If you have any problems or questions after discharge, please call Dr. Rosine Solecki ([facility]) at [phone].  HOME CARE INSTRUCTIONS Activity You may resume your regular activity but move at a slower pace for the next 24 hours.  Take frequent rest periods for the next 24 hours.  Walking will help expel (get rid of) the air and reduce the bloated feeling in your abdomen.  No driving for 24 hours (because of the anesthesia (medicine) used during the test).  You may shower.  Do not sign any important legal documents or operate any machinery for 24 hours (because of the anesthesia used during the test).  Nutrition Drink plenty of fluids.  You may resume your normal diet.  Begin with a light meal and progress to your normal diet.  Avoid alcoholic beverages for 24 hours or as instructed by your caregiver.  Medications You may resume your normal medications unless your caregiver tells you otherwise. What you can expect today You may experience abdominal discomfort such as a feeling of fullness or "gas" pains.  You may experience a sore throat for 2 to 3 days. This is normal. Gargling with salt water may help this.   SEEK IMMEDIATE MEDICAL CARE IF: You have excessive nausea (feeling sick to your stomach) and/or vomiting.  You have severe abdominal pain and distention (swelling).  You have trouble swallowing.  You have a temperature over 100 F (37.8 C).  You have rectal bleeding or vomiting of blood.  Document Released: 02/05/2004 Document Revised: 03/05/2011  Document Reviewed: 08/18/2007 ExitCare Patient Information 2012 [employer].   Colonoscopy  Post procedure instructions:  Read the instructions outlined below and refer to this sheet in the next few weeks. These discharge instructions provide you with general information on caring for yourself after you leave the hospital. Your doctor may also give you specific instructions. While your treatment has been planned according to the most current medical practices available, unavoidable complications occasionally occur. If you have any problems or questions after discharge, call Dr. Marda Breidenbach at [facility] ([phone]).  HOME CARE INSTRUCTIONS  ACTIVITY: You may resume your regular activity, but move at a slower pace for the next 24 hours.  Take frequent rest periods for the next 24 hours.  Walking will help get rid of the air and reduce the bloated feeling in your belly (abdomen).  No driving for 24 hours (because of the medicine (anesthesia) used during the test).  You may shower.  Do not sign any important legal documents or operate any machinery for 24 hours (because of the anesthesia used during the test).  NUTRITION: Drink plenty of fluids.  You may resume your normal diet as instructed by your doctor.  Begin with a light meal and progress to your normal diet. Heavy or fried foods are harder to digest and may make you feel sick to your stomach (nauseated).  Avoid alcoholic beverages for 24 hours or as instructed.  MEDICATIONS: You may resume your normal medications unless your doctor tells you otherwise.  WHAT TO EXPECT TODAY: Some feelings of bloating in the abdomen.  Passage of more gas than usual.  Spotting   of blood in your stool or on the toilet paper.  IF YOU HAD POLYPS REMOVED DURING THE COLONOSCOPY: No aspirin products for 7 days or as instructed.  No alcohol for 7 days or as instructed.  Eat a soft diet for the next 24 hours.   FINDING OUT THE RESULTS OF YOUR  TEST  Not all test results are available during your visit. If your test results are not back during the visit, make an appointment with your caregiver to find out the results. Do not assume everything is normal if you have not heard from your caregiver or the medical facility. It is important for you to follow up on all of your test results.     SEEK IMMEDIATE MEDICAL CARE IF:  You have more than a spotting of blood in your stool.  Your belly is swollen (abdominal distention).  You are nauseated or vomiting.  You have a fever.  You have abdominal pain or discomfort that is severe or gets worse throughout the day.    Document Released: 02/05/2004 Document Revised: 03/05/2011 Document Reviewed: 02/03/2008 ExitCare Patient Information 2012 [employer].  

## 2014-06-07 NOTE — Op Note (Signed)
[facility] [address] [place], [id number]   COLONOSCOPY PROCEDURE REPORT  PATIENT: Anna Mcguire, Anna Mcguire  MR#: [id number] BIRTHDATE: 1956/05/17 , 58  yrs. old GENDER: female ENDOSCOPIST: Willis ModenaWilliam Yeila Morro, MD REFERRED BY: Augustine RadarKristina Roberson, FNP-C PROCEDURE DATE:  06/07/2014 PROCEDURE:   Colonoscopy, diagnostic ASA CLASS:   Class II INDICATIONS:blood in stool, weight loss and first colonoscopy. MEDICATIONS: Monitored anesthesia care  DESCRIPTION OF PROCEDURE:   After the risks benefits and alternatives of the procedure were thoroughly explained, informed consent was obtained.  revealed external hemorrhoids.   The pediatric colonoscope was introduced through the anus and advanced to the cecum, which was identified by both the appendix and ileocecal valve. No adverse events experienced.   The quality of the prep was  good.       The instrument was then slowly withdrawn as the colon was fully examined.    Findings:  Moderate external hemorrhoids, otherwise normal digital rectal exam.  Prep quality was good.  Scattered small- to medium-sized diverticula seen throughout the colon.  No polyps, masses, vascular ectasias, or inflammatory changes were identified. Mild internal hemorrhoids, with some erythema, were seen, otherwise normal retroflexed view of rectum.         Withdrawal time was 11 minutes     .  The scope was withdrawn and the procedure completed.  COMPLICATIONS:  ENDOSCOPIC IMPRESSION:     Hemorrhoids, likely source of blood in stool.  Scattered diverticulosis.  No explanation for patient's weight loss identified.  RECOMMENDATIONS:     1.  Watch for potential complications or procedure. 2.  High fiber diet. 3.  Topical therapies (e.g., Preparation-H) for intermittent rectal bleeding. 4.  Repeat colonoscopy for screening purposes in 10 years. 5.  OV in 3 months; if weight loss persists, could consider CT scan chest/abdomen/pelvis with contrast for  further evaluation.   eSigned:  Willis ModenaWilliam Akera Snowberger, MD 06/07/2014 12:38 PM   cc:  CPT CODES: ICD CODES:  The ICD and CPT codes recommended by this software are interpretations from the data that the clinical staff has captured with the software.  The verification of the translation of this report to the ICD and CPT codes and modifiers is the sole responsibility of the health care institution and practicing physician where this report was generated.  [employer] will not be held responsible for the validity of the ICD and CPT codes included on this report.  AMA assumes no liability for data contained or not contained herein. CPT is a Publishing rights managerregistered trademark of the [employer].

## 2014-06-07 NOTE — Anesthesia Preprocedure Evaluation (Deleted)
Anesthesia Evaluation  Patient identified by MRN, date of birth, ID band Patient awake    Reviewed: Allergy & Precautions, H&P , NPO status , Patient's Chart, lab work & pertinent test results  History of Anesthesia Complications (+) history of anesthetic complications  Airway Mallampati: II  TM Distance: >3 FB Neck ROM: Full    Dental no notable dental hx.    Pulmonary Current Smoker,  breath sounds clear to auscultation  Pulmonary exam normal       Cardiovascular hypertension, Pt. on medications Rhythm:Regular Rate:Normal     Neuro/Psych negative neurological ROS  negative psych ROS   GI/Hepatic Neg liver ROS, GERD-  Medicated,  Endo/Other  negative endocrine ROS  Renal/GU negative Renal ROS  negative genitourinary   Musculoskeletal negative musculoskeletal ROS (+)   Abdominal   Peds negative pediatric ROS (+)  Hematology negative hematology ROS (+)   Anesthesia Other Findings   Reproductive/Obstetrics negative OB ROS                             Anesthesia Physical Anesthesia Plan  ASA: II  Anesthesia Plan: MAC   Post-op Pain Management:    Induction: Intravenous  Airway Management Planned: Nasal Cannula  Additional Equipment:   Intra-op Plan:   Post-operative Plan:   Informed Consent: I have reviewed the patients History and Physical, chart, labs and discussed the procedure including the risks, benefits and alternatives for the proposed anesthesia with the patient or authorized representative who has indicated his/her understanding and acceptance.   Dental advisory given  Plan Discussed with: CRNA and Surgeon  Anesthesia Plan Comments:        Anesthesia Quick Evaluation

## 2014-06-07 NOTE — Op Note (Signed)
[facility] [address] [place], [id number]   ENDOSCOPY PROCEDURE REPORT  PATIENT: Anna Mcguire, Anna Mcguire  MR#: [id number] BIRTHDATE: 09-19-1955 , 58  yrs. old GENDER: female ENDOSCOPIST: Willis ModenaWilliam Liliahna Cudd, MD REFERRED BY:  Augustine RadarKristina Roberson, FNP-C PROCEDURE DATE:  06/07/2014 PROCEDURE:  EGD, diagnostic ASA CLASS:     Class II INDICATIONS:  blood in stool, weight loss, GERD. MEDICATIONS: Monitored anesthesia care TOPICAL ANESTHETIC: Cetacaine Spray  DESCRIPTION OF PROCEDURE: After the risks benefits and alternatives of the procedure were thoroughly explained, informed consent was obtained.  The [employer] Gastroscope [id number] endoscope was introduced through the mouth and advanced to the second portion of the duodenum. The instrument was slowly withdrawn as the mucosa was fully examined.    Findings:  Normal esophagus; specifically, no evidence of inflammation, stricture, mass, varices, or Barrett's mucosa. Normal stomach, including retroflexion into the cardia.  Normal pylorus.  Normal duodenum to the second portion.              The scope was then withdrawn from the patient and the procedure completed.  COMPLICATIONS: There were no immediate complications.  ENDOSCOPIC IMPRESSION:     Normal endoscopy; no explanation for patient's blood in stool or weight loss identified.  RECOMMENDATIONS:     1.  Watch for potential complications of procedure. 2.  Proceed with colonoscopy today.  eSigned:  Willis ModenaWilliam Tziporah Knoke, MD 06/07/2014 12:31 PM   CC:  CPT CODES: ICD CODES:  The ICD and CPT codes recommended by this software are interpretations from the data that the clinical staff has captured with the software.  The verification of the translation of this report to the ICD and CPT codes and modifiers is the sole responsibility of the health care institution and practicing physician where this report was generated.  [employer] will not be held responsible  for the validity of the ICD and CPT codes included on this report.  AMA assumes no liability for data contained or not contained herein. CPT is a Publishing rights managerregistered trademark of the [employer].

## 2014-06-07 NOTE — H&P (Signed)
Patient interval history reviewed.  Patient examined again.  There has been no change from documented H/P dated 05/22/14 (scanned into chart from our office) except as documented above.  Assessment:  1.  Blood in stool. 2.  Unintentional weight loss.  Plan:  1.  Endoscopy. 2.  Risks (bleeding, infection, bowel perforation that could require surgery, sedation-related changes in cardiopulmonary systems), benefits (identification and possible treatment of source of symptoms, exclusion of certain causes of symptoms), and alternatives (watchful waiting, radiographic imaging studies, empiric medical treatment) of upper endoscopy (EGD) were explained to patient/family in detail and patient wishes to proceed. 3.  Colonoscopy. 4.  Risks (bleeding, infection, bowel perforation that could require surgery, sedation-related changes in cardiopulmonary systems), benefits (identification and possible treatment of source of symptoms, exclusion of certain causes of symptoms), and alternatives (watchful waiting, radiographic imaging studies, empiric medical treatment) of colonoscopy were explained to patient/family in detail and patient wishes to proceed.

## 2014-06-07 NOTE — Transfer of Care (Signed)
Immediate Anesthesia Transfer of Care Note  Patient: Anna Mcguire  Procedure(s) Performed: Procedure(s) (LRB): ESOPHAGOGASTRODUODENOSCOPY (EGD) WITH PROPOFOL (N/A) COLONOSCOPY WITH PROPOFOL (N/A)  Patient Location: endoscopy  Anesthesia Type: MAC  Level of Consciousness: sedated, patient cooperative and responds to stimulation  Airway & Oxygen Therapy: Patient Spontanous Breathing and Patient connected to face mask oxgen  Post-op Assessment: Report given to endoscopy RN and Post -op Vital signs reviewed and stable  Post vital signs: Reviewed and stable  Complications: No apparent anesthesia complications

## 2014-06-08 ENCOUNTER — Encounter (HOSPITAL_COMMUNITY): Payer: Self-pay | Admitting: Gastroenterology

## 2014-06-08 NOTE — Anesthesia Postprocedure Evaluation (Signed)
  Anesthesia Post-op Note  Patient: Anna Mcguire  Procedure(s) Performed: Procedure(s) (LRB): ESOPHAGOGASTRODUODENOSCOPY (EGD) WITH PROPOFOL (N/A) COLONOSCOPY WITH PROPOFOL (N/A)  Patient Location: PACU  Anesthesia Type: MAC  Level of Consciousness: awake and alert   Airway and Oxygen Therapy: Patient Spontanous Breathing  Post-op Pain: mild  Post-op Assessment: Post-op Vital signs reviewed, Patient's Cardiovascular Status Stable, Respiratory Function Stable, Patent Airway and No signs of Nausea or vomiting  Last Vitals:  Filed Vitals:   06/07/14 1255  BP: 170/94  Pulse: 75  Temp:   Resp: 16    Post-op Vital Signs: stable   Complications: No apparent anesthesia complications

## 2015-03-06 ENCOUNTER — Other Ambulatory Visit (HOSPITAL_COMMUNITY): Payer: Self-pay | Admitting: *Deleted

## 2015-03-06 DIAGNOSIS — Z1231 Encounter for screening mammogram for malignant neoplasm of breast: Secondary | ICD-10-CM

## 2015-03-20 ENCOUNTER — Ambulatory Visit (HOSPITAL_COMMUNITY): Payer: Self-pay

## 2015-04-03 ENCOUNTER — Ambulatory Visit (HOSPITAL_COMMUNITY)
Admission: RE | Admit: 2015-04-03 | Discharge: 2015-04-03 | Disposition: A | Discharge status: Home / Self Care | Payer: Self-pay | Source: Ambulatory Visit | Attending: *Deleted | Admitting: *Deleted

## 2015-04-03 DIAGNOSIS — Z1231 Encounter for screening mammogram for malignant neoplasm of breast: Secondary | ICD-10-CM

## 2016-01-07 ENCOUNTER — Encounter (HOSPITAL_COMMUNITY): Payer: Self-pay | Admitting: *Deleted

## 2016-01-07 ENCOUNTER — Ambulatory Visit (HOSPITAL_COMMUNITY)
Admission: EM | Admit: 2016-01-07 | Discharge: 2016-01-07 | Disposition: A | Discharge status: Home / Self Care | Payer: Self-pay | Attending: Physician Assistant | Admitting: Physician Assistant

## 2016-01-07 ENCOUNTER — Ambulatory Visit (HOSPITAL_COMMUNITY): Payer: Self-pay

## 2016-01-07 DIAGNOSIS — Z79899 Other long term (current) drug therapy: Secondary | ICD-10-CM | POA: Insufficient documentation

## 2016-01-07 DIAGNOSIS — Z833 Family history of diabetes mellitus: Secondary | ICD-10-CM | POA: Insufficient documentation

## 2016-01-07 DIAGNOSIS — M25562 Pain in left knee: Secondary | ICD-10-CM | POA: Insufficient documentation

## 2016-01-07 DIAGNOSIS — I10 Essential (primary) hypertension: Secondary | ICD-10-CM | POA: Insufficient documentation

## 2016-01-07 DIAGNOSIS — K219 Gastro-esophageal reflux disease without esophagitis: Secondary | ICD-10-CM | POA: Insufficient documentation

## 2016-01-07 DIAGNOSIS — W010XXA Fall on same level from slipping, tripping and stumbling without subsequent striking against object, initial encounter: Secondary | ICD-10-CM | POA: Insufficient documentation

## 2016-01-07 DIAGNOSIS — Z8249 Family history of ischemic heart disease and other diseases of the circulatory system: Secondary | ICD-10-CM | POA: Insufficient documentation

## 2016-01-07 DIAGNOSIS — F17200 Nicotine dependence, unspecified, uncomplicated: Secondary | ICD-10-CM | POA: Insufficient documentation

## 2016-01-07 DIAGNOSIS — M1712 Unilateral primary osteoarthritis, left knee: Secondary | ICD-10-CM | POA: Insufficient documentation

## 2016-01-07 MED ORDER — TRAMADOL HCL 50 MG PO TABS: 50.0000 mg | ORAL_TABLET | Freq: Four times a day (QID) | ORAL | Status: DC | PRN

## 2016-01-07 NOTE — ED Provider Notes (Signed)
CSN: [id number]     Arrival date & time 01/07/16  [id number] History   None    Chief Complaint  Patient presents with  . Fall  . Knee Pain   (Consider location/radiation/quality/duration/timing/severity/associated sxs/prior Treatment) HPI History obtained from patient: Location:  Left knee Context/Duration:  Tripped and fell Saturday night Severity: 4  Quality: Aching Timing:           Constant Home Treatment: Cold compresses, Tylenol Associated symptoms:  Hurts to walk Family History: Hypertension and diabetes    Past Medical History  Diagnosis Date  . Hypertension   . Acid reflux   . Arthritis    Past Surgical History  Procedure Laterality Date  . Esophagogastroduodenoscopy (egd) with propofol N/A 06/07/2014    Procedure: ESOPHAGOGASTRODUODENOSCOPY (EGD) WITH PROPOFOL;  Surgeon: Willis ModenaWilliam Outlaw, MD;  Location: [facility];  Service: Endoscopy;  Laterality: N/A;  . Colonoscopy with propofol N/A 06/07/2014    Procedure: COLONOSCOPY WITH PROPOFOL;  Surgeon: Willis ModenaWilliam Outlaw, MD;  Location: [facility];  Service: Endoscopy;  Laterality: N/A;   History reviewed. No pertinent family history. Social History  Substance Use Topics  . Smoking status: Current Every Day Smoker  . Smokeless tobacco: None  . Alcohol Use: Yes   OB History    No data available     Review of Systems  Denies: HEADACHE, NAUSEA, ABDOMINAL PAIN, CHEST PAIN, CONGESTION, DYSURIA, SHORTNESS OF BREATH  Allergies  Review of patient's allergies indicates no known allergies.  Home Medications   Prior to Admission medications   Medication Sig Start Date End Date Taking? Authorizing Provider  fluticasone (FLONASE) 50 MCG/ACT nasal spray Place 1 spray into both nostrils daily.   Yes Historical Provider, MD  lisinopril-hydrochlorothiazide (PRINZIDE,ZESTORETIC) 20-12.5 MG per tablet Take 1 tablet by mouth daily.   Yes Historical Provider, MD  lovastatin (MEVACOR) 20 MG tablet Take 20 mg by mouth daily at 6 PM.   Yes  Historical Provider, MD  meloxicam (MOBIC) 15 MG tablet Take 15 mg by mouth daily.   Yes Historical Provider, MD  pantoprazole (PROTONIX) 40 MG tablet Take 40 mg by mouth daily.   Yes Historical Provider, MD  thiamine (VITAMIN B-1) 100 MG tablet Take 100 mg by mouth daily.   Yes Historical Provider, MD  azithromycin (ZITHROMAX Z-PAK) 250 MG tablet Take 250 mg by mouth daily. 5 day course (7/20-7/24) 01/23/14   Historical Provider, MD  doxycycline (VIBRAMYCIN) 100 MG capsule Take 1 capsule (100 mg total) by mouth 2 (two) times daily. 01/31/14   Clement SayresStaci Shepard, MD   Meds Ordered and Administered this Visit  Medications - No data to display  BP 150/100 mmHg  Pulse 87  Temp(Src) 98.8 F (37.1 C) (Oral)  Resp 17  SpO2 98% No data found.   Physical Exam NURSES NOTES AND VITAL SIGNS REVIEWED. CONSTITUTIONAL: Well developed, well nourished, no acute distress HEENT: normocephalic, atraumatic EYES: Conjunctiva normal NECK:normal ROM, supple, no adenopathy PULMONARY:No respiratory distress, normal effort ABDOMINAL: Soft, ND, NT BS+, No CVAT MUSCULOSKELETAL: Normal ROM of all extremities, left knee mildly swollen tender to palpation over the proximal tibia crepitus is noted anterior drawer sign is absent. SKIN: warm and dry without rash PSYCHIATRIC: Mood and affect, behavior are normal  ED Course  Procedures (including critical care time)  Labs Review Labs Reviewed - No data to display  Imaging Review No results found.  Review of x-ray discussed with patient and uses part of the MDM Visual Acuity Review  Right Eye Distance:   Left  Eye Distance:   Bilateral Distance:    Right Eye Near:   Left Eye Near:    Bilateral Near:         MDM   1. Knee pain, acute, left   2. Primary osteoarthritis of left knee     Patient is reassured that there are no issues that require transfer to higher level of care at this time or additional tests. Patient is advised to continue home  symptomatic treatment. Patient is advised that if there are new or worsening symptoms to attend the emergency department, contact primary care provider, or return to UC. Instructions of care provided discharged home in stable condition.    THIS NOTE WAS GENERATED USING A VOICE RECOGNITION SOFTWARE PROGRAM. ALL REASONABLE EFFORTS  WERE MADE TO PROOFREAD THIS DOCUMENT FOR ACCURACY.  I have verbally reviewed the discharge instructions with the patient. A printed AVS was given to the patient.  All questions were answered prior to discharge.      Tharon AquasFrank C Chealsea Paske, PA 01/07/16 1306

## 2016-01-07 NOTE — Discharge Instructions (Signed)
Heat Therapy °Heat therapy can help ease sore, stiff, injured, and tight muscles and joints. Heat relaxes your muscles, which may help ease your pain. Heat therapy should only be used on old, pre-existing, or long-lasting (chronic) injuries. Do not use heat therapy unless told by your doctor. °HOW TO USE HEAT THERAPY °There are several different kinds of heat therapy, including: °· Moist heat pack. °· Warm water bath. °· Hot water bottle. °· Electric heating pad. °· Heated gel pack. °· Heated wrap. °· Electric heating pad. °GENERAL HEAT THERAPY RECOMMENDATIONS  °· Do not sleep while using heat therapy. Only use heat therapy while you are awake. °· Your skin may turn pink while using heat therapy. Do not use heat therapy if your skin turns red. °· Do not use heat therapy if you have new pain. °· High heat or long exposure to heat can cause burns. Be careful when using heat therapy to avoid burning your skin. °· Do not use heat therapy on areas of your skin that are already irritated, such as with a rash or sunburn. °GET HELP IF:  °· You have blisters, redness, swelling (puffiness), or numbness. °· You have new pain. °· Your pain is worse. °MAKE SURE YOU: °· Understand these instructions. °· Will watch your condition. °· Will get help right away if you are not doing well or get worse. °  °This information is not intended to replace advice given to you by your health care provider. Make sure you discuss any questions you have with your health care provider. °  °Document Released: 09/15/2011 Document Revised: 07/14/2014 Document Reviewed: 08/16/2013 °Elsevier Interactive Patient Education ©2016 [employer] ° °

## 2016-01-07 NOTE — ED Notes (Addendum)
Patient states that she tripped down the steps on Sunday night and is now having left knee pain. Left knee swelling noted on exam. Patient has been soaking and elevating at home. Reports numbness and tingling distally to swelling. Denies hitting head during fall, states hit left side of body.

## 2016-10-16 ENCOUNTER — Encounter (HOSPITAL_COMMUNITY): Payer: Self-pay | Admitting: *Deleted

## 2016-10-16 ENCOUNTER — Emergency Department (HOSPITAL_COMMUNITY)
Admission: EM | Admit: 2016-10-16 | Discharge: 2016-10-16 | Disposition: A | Discharge status: Home / Self Care | Payer: Self-pay | Attending: Emergency Medicine | Admitting: Emergency Medicine

## 2016-10-16 DIAGNOSIS — Z79899 Other long term (current) drug therapy: Secondary | ICD-10-CM | POA: Insufficient documentation

## 2016-10-16 DIAGNOSIS — F17200 Nicotine dependence, unspecified, uncomplicated: Secondary | ICD-10-CM | POA: Insufficient documentation

## 2016-10-16 DIAGNOSIS — I10 Essential (primary) hypertension: Secondary | ICD-10-CM | POA: Insufficient documentation

## 2016-10-16 DIAGNOSIS — M25561 Pain in right knee: Secondary | ICD-10-CM | POA: Insufficient documentation

## 2016-10-16 DIAGNOSIS — M25562 Pain in left knee: Secondary | ICD-10-CM | POA: Insufficient documentation

## 2016-10-16 MED ORDER — MELOXICAM 15 MG PO TABS: 15.0000 mg | ORAL_TABLET | Freq: Every day | ORAL | 0 refills | Status: DC

## 2016-10-16 MED ORDER — ACETAMINOPHEN 500 MG PO TABS: 500.0000 mg | ORAL_TABLET | Freq: Four times a day (QID) | ORAL | 0 refills | Status: DC | PRN

## 2016-10-16 NOTE — ED Provider Notes (Signed)
[facility] Provider Note   CSN: [id number] Arrival date & time: 10/16/16  1302   By signing my name below, I, Bobbie Stack, attest that this documentation has been prepared under the direction and in the presence of Fayrene Helper, PA-C. Electronically Signed: Bobbie Stack, Scribe. 10/16/16. 1:44 PM. History   Chief Complaint Chief Complaint  Patient presents with  . Knee Pain    The history is provided by the patient. No language interpreter was used.  HPI Comments: Anna Mcguire is a 61 y.o. female who presents to the Emergency Department complaining of worsening bilateral leg pain for the past couple of weeks. She states that the pain is worse in her left knee. She states that she was diagnosed with rheumatoid arthritis 2 weeks ago by her PCP. She was prescribed tylenol and meloxicam during her visit. She states that she recently lost her medication and has been unable to take it.  She has been having some difficulty walking recently due to the pain. She reports subjective fever and sore throat for the past couple of days as well, but that has since resolved. The leg and knee pain came before her sore throat. She denies any recent injuries or extraneous activities. She has no known medical problems. She is a current tobacco user of half a pack a day. She also drinks EtOH quite frequently. She denies numbness, fevers, or weakness.  Past Medical History:  Diagnosis Date  . Acid reflux   . Arthritis   . Hypertension     There are no active problems to display for this patient.   Past Surgical History:  Procedure Laterality Date  . COLONOSCOPY WITH PROPOFOL N/A 06/07/2014   Procedure: COLONOSCOPY WITH PROPOFOL;  Surgeon: Willis Modena, MD;  Location: [facility];  Service: Endoscopy;  Laterality: N/A;  . ESOPHAGOGASTRODUODENOSCOPY (EGD) WITH PROPOFOL N/A 06/07/2014   Procedure: ESOPHAGOGASTRODUODENOSCOPY (EGD) WITH PROPOFOL;  Surgeon: Willis Modena, MD;  Location: [facility];  Service: Endoscopy;  Laterality: N/A;    OB History    No data available       Home Medications    Prior to Admission medications   Medication Sig Start Date End Date Taking? Authorizing Provider  azithromycin (ZITHROMAX Z-PAK) 250 MG tablet Take 250 mg by mouth daily. 5 day course (7/20-7/24) 01/23/14   Historical Provider, MD  doxycycline (VIBRAMYCIN) 100 MG capsule Take 1 capsule (100 mg total) by mouth 2 (two) times daily. 01/31/14   Clement Sayres, MD  fluticasone (FLONASE) 50 MCG/ACT nasal spray Place 1 spray into both nostrils daily.    Historical Provider, MD  lisinopril-hydrochlorothiazide (PRINZIDE,ZESTORETIC) 20-12.5 MG per tablet Take 1 tablet by mouth daily.    Historical Provider, MD  lovastatin (MEVACOR) 20 MG tablet Take 20 mg by mouth daily at 6 PM.    Historical Provider, MD  meloxicam (MOBIC) 15 MG tablet Take 15 mg by mouth daily.    Historical Provider, MD  pantoprazole (PROTONIX) 40 MG tablet Take 40 mg by mouth daily.    Historical Provider, MD  thiamine (VITAMIN B-1) 100 MG tablet Take 100 mg by mouth daily.    Historical Provider, MD  traMADol (ULTRAM) 50 MG tablet Take 1 tablet (50 mg total) by mouth every 6 (six) hours as needed. 01/07/16   Tharon Aquas, PA    Family History No family history on file.  Social History Social History  Substance Use Topics  . Smoking status: Current Every Day Smoker  . Smokeless tobacco: Never Used  .  Alcohol use Yes     Comment: occ     Allergies   Penicillins   Review of Systems Review of Systems  Constitutional: Negative for fever.  Musculoskeletal: Positive for arthralgias and myalgias.       Left knee and bilateral leg pain.  Neurological: Negative for weakness and numbness.     Physical Exam Updated Vital Signs BP (!) 160/95   Pulse 85   Temp 98.3 F (36.8 C) (Oral)   Resp 18   SpO2 100%   Physical Exam  Constitutional: She is oriented to person, place, and time. She appears  well-developed and well-nourished.  HENT:  Head: Normocephalic.  Eyes: EOM are normal.  Neck: Normal range of motion.  Pulmonary/Chest: Effort normal.  Abdominal: She exhibits no distension.  Musculoskeletal: Normal range of motion.  Mild diffuse tenderness to BLE. Significant tenderness to left knee. Medial and later joint tenderness of left knee. Normal knee flexion and extension. No erythema, palpable cords, or edema.  Neurological: She is alert and oriented to person, place, and time.  Psychiatric: She has a normal mood and affect.  Nursing note and vitals reviewed.  ED Treatments / Results  DIAGNOSTIC STUDIES: Oxygen Saturation is 100% on RA, normal by my interpretation.    COORDINATION OF CARE: 1:34 PM Discussed treatment plan with pt at bedside and pt agreed to plan. I will check the patient's left knee x-ray.  Labs (all labs ordered are listed, but only abnormal results are displayed) Labs Reviewed - No data to display  EKG  EKG Interpretation None       Radiology No results found.  Procedures Procedures (including critical care time)  Medications Ordered in ED Medications - No data to display   Initial Impression / Assessment and Plan / ED Course  I have reviewed the triage vital signs and the nursing notes.  Pertinent labs & imaging results that were available during my care of the patient were reviewed by me and considered in my medical decision making (see chart for details).     BP (!) 160/95   Pulse 85   Temp 98.3 F (36.8 C) (Oral)   Resp 18   SpO2 100%  The patient was noted to be hypertensive today in the emergency department. I have spoken with the patient regarding hypertension and the need for improved management. I instructed the patient to followup with the Primary care doctor within 4 days to improve the management of the patient's hypertension. I also counseled the patient regarding the signs and symptoms which would require an emergent  visit to an emergency department for hypertensive urgency and/or hypertensive emergency. The patient understood the need for improved hypertensive management.   Final Clinical Impressions(s) / ED Diagnoses   Final diagnoses:  Arthralgia of both knees    New Prescriptions New Prescriptions   ACETAMINOPHEN (TYLENOL) 500 MG TABLET    Take 1 tablet (500 mg total) by mouth every 6 (six) hours as needed.   I personally performed the services described in this documentation, which was scribed in my presence. The recorded information has been reviewed and is accurate.   Pt here with persistent knee pain, chronic in nature.  Has had for 10 years, recently worse.  No evidence to suggest septic joint.  She is NVI.  Able to ambulate.  No recent injury.  Will perform sxs care and out pt f/u.  Return precaution given.  Throat exam unremarkable.    Fayrene Helper, PA-C 10/16/16 1419  Laurence Spates, MD 10/16/16 [phone]

## 2016-10-16 NOTE — ED Triage Notes (Signed)
Pt is here with arthritis in left knee and has been hurting all over her body and states she lost her medications when she was cleaning up.  Pt states hard to walk and get up and reports laryngitis.  Problems talking for a couple of days

## 2016-10-16 NOTE — Discharge Instructions (Signed)
Please take meloxicam and tylenol as needed for pain.  Follow up with your doctor for further management of your arthritis.

## 2016-10-16 NOTE — ED Notes (Signed)
Pt in requesting refills on her medication after loosing them. She has requested melixcam and tylenol 500 mg that she was previously taking.

## 2017-05-26 ENCOUNTER — Other Ambulatory Visit: Payer: Self-pay | Admitting: *Deleted

## 2017-05-26 DIAGNOSIS — Z1231 Encounter for screening mammogram for malignant neoplasm of breast: Secondary | ICD-10-CM

## 2018-10-19 ENCOUNTER — Telehealth (HOSPITAL_COMMUNITY): Payer: Self-pay

## 2019-01-27 ENCOUNTER — Other Ambulatory Visit: Payer: Self-pay | Admitting: *Deleted

## 2019-01-27 DIAGNOSIS — Z1231 Encounter for screening mammogram for malignant neoplasm of breast: Secondary | ICD-10-CM

## 2019-03-22 ENCOUNTER — Other Ambulatory Visit: Payer: Self-pay

## 2019-03-22 ENCOUNTER — Ambulatory Visit
Admission: RE | Admit: 2019-03-22 | Discharge: 2019-03-22 | Disposition: A | Discharge status: Home / Self Care | Payer: No Typology Code available for payment source | Source: Ambulatory Visit | Attending: *Deleted | Admitting: *Deleted

## 2019-03-22 DIAGNOSIS — Z1231 Encounter for screening mammogram for malignant neoplasm of breast: Secondary | ICD-10-CM

## 2019-05-12 ENCOUNTER — Other Ambulatory Visit: Payer: Self-pay

## 2019-05-12 ENCOUNTER — Ambulatory Visit (HOSPITAL_COMMUNITY)
Admission: EM | Admit: 2019-05-12 | Discharge: 2019-05-12 | Disposition: A | Discharge status: Home / Self Care | Payer: Medicare Other | Attending: Family Medicine | Admitting: Family Medicine

## 2019-05-12 ENCOUNTER — Encounter (HOSPITAL_COMMUNITY): Payer: Self-pay

## 2019-05-12 DIAGNOSIS — Z760 Encounter for issue of repeat prescription: Secondary | ICD-10-CM | POA: Diagnosis not present

## 2019-05-12 DIAGNOSIS — E785 Hyperlipidemia, unspecified: Secondary | ICD-10-CM | POA: Diagnosis not present

## 2019-05-12 DIAGNOSIS — I10 Essential (primary) hypertension: Secondary | ICD-10-CM | POA: Diagnosis not present

## 2019-05-12 DIAGNOSIS — Z720 Tobacco use: Secondary | ICD-10-CM

## 2019-05-12 DIAGNOSIS — K219 Gastro-esophageal reflux disease without esophagitis: Secondary | ICD-10-CM | POA: Diagnosis not present

## 2019-05-12 MED ORDER — MELOXICAM 15 MG PO TABS: 15.0000 mg | ORAL_TABLET | Freq: Every day | ORAL | 0 refills | Status: DC

## 2019-05-12 MED ORDER — LORATADINE 10 MG PO TABS: 10.0000 mg | ORAL_TABLET | Freq: Every day | ORAL | 0 refills | Status: DC

## 2019-05-12 MED ORDER — AMLODIPINE BESYLATE 10 MG PO TABS: 10.0000 mg | ORAL_TABLET | Freq: Every day | ORAL | 0 refills | Status: DC

## 2019-05-12 MED ORDER — MONTELUKAST SODIUM 10 MG PO TABS: 10.0000 mg | ORAL_TABLET | Freq: Every day | ORAL | 0 refills | Status: DC

## 2019-05-12 MED ORDER — CYCLOBENZAPRINE HCL 10 MG PO TABS: 10.0000 mg | ORAL_TABLET | Freq: Two times a day (BID) | ORAL | 0 refills | Status: DC | PRN

## 2019-05-12 MED ORDER — HYDROCHLOROTHIAZIDE 25 MG PO TABS: 25.0000 mg | ORAL_TABLET | Freq: Every day | ORAL | 0 refills | Status: DC

## 2019-05-12 MED ORDER — ATORVASTATIN CALCIUM 80 MG PO TABS: 80.0000 mg | ORAL_TABLET | Freq: Every day | ORAL | 0 refills | Status: DC

## 2019-05-12 MED ORDER — ASPIRIN EC 81 MG PO TBEC: 81.0000 mg | DELAYED_RELEASE_TABLET | Freq: Every day | ORAL | 0 refills | Status: DC

## 2019-05-12 MED ORDER — PANTOPRAZOLE SODIUM 40 MG PO TBEC: 40.0000 mg | DELAYED_RELEASE_TABLET | Freq: Every day | ORAL | 0 refills | Status: DC

## 2019-05-12 NOTE — Discharge Instructions (Signed)
I have refilled your medicines Follow up with primary care as planned for further refills Follow up here in the meantime if having any issues

## 2019-05-12 NOTE — ED Provider Notes (Addendum)
[facility]    CSN: [id number] Arrival date & time: 05/12/19  4098      History   Chief Complaint Chief Complaint  Patient presents with   Medication Refill    HPI Anna Mcguire is a 63 y.o. female history of hypertension, hyperlipidemia, arthritis, GERD, tobacco use, presenting today for evaluation of medication refill.  Patient would like a refill on all of her medicines.  She has a appointment on 10/10 with [facility] to establish care with primary care.  She has been out of her medicines for approximately 2 weeks.  She denies any problems with any of her medicines.  Denies any chest pain or shortness of breath.  Patient continues to smoke tobacco, she smokes approximately 1/3 pack/day.  She denies any changes in vision or headaches.  HPI  Past Medical History:  Diagnosis Date   Acid reflux    Arthritis    Hypertension     There are no active problems to display for this patient.   Past Surgical History:  Procedure Laterality Date   COLONOSCOPY WITH PROPOFOL N/A 06/07/2014   Procedure: COLONOSCOPY WITH PROPOFOL;  Surgeon: Willis Modena, MD;  Location: [facility];  Service: Endoscopy;  Laterality: N/A;   ESOPHAGOGASTRODUODENOSCOPY (EGD) WITH PROPOFOL N/A 06/07/2014   Procedure: ESOPHAGOGASTRODUODENOSCOPY (EGD) WITH PROPOFOL;  Surgeon: Willis Modena, MD;  Location: [facility];  Service: Endoscopy;  Laterality: N/A;    OB History   No obstetric history on file.      Home Medications    Prior to Admission medications   Medication Sig Start Date End Date Taking? Authorizing Provider  acetaminophen (TYLENOL) 500 MG tablet Take 1 tablet (500 mg total) by mouth every 6 (six) hours as needed. 10/16/16   Fayrene Helper, PA-C  amLODipine (NORVASC) 10 MG tablet Take 1 tablet (10 mg total) by mouth daily. 05/12/19   Charlea Nardo C, PA-C  aspirin EC 81 MG tablet Take 1 tablet (81 mg total) by mouth daily. 05/12/19   Staci Dack  C, PA-C  atorvastatin (LIPITOR) 80 MG tablet Take 1 tablet (80 mg total) by mouth daily. 05/12/19   Daniyal Tabor C, PA-C  cyclobenzaprine (FLEXERIL) 10 MG tablet Take 1 tablet (10 mg total) by mouth 2 (two) times daily as needed for muscle spasms. 05/12/19   Marico Buckle C, PA-C  hydrochlorothiazide (HYDRODIURIL) 25 MG tablet Take 1 tablet (25 mg total) by mouth daily. 05/12/19   Daymien Goth C, PA-C  loratadine (CLARITIN) 10 MG tablet Take 1 tablet (10 mg total) by mouth daily. 05/12/19   Dyquan Minks C, PA-C  meloxicam (MOBIC) 15 MG tablet Take 1 tablet (15 mg total) by mouth daily. 05/12/19   Kalani Sthilaire C, PA-C  montelukast (SINGULAIR) 10 MG tablet Take 1 tablet (10 mg total) by mouth at bedtime. 05/12/19   Daevon Holdren C, PA-C  pantoprazole (PROTONIX) 40 MG tablet Take 1 tablet (40 mg total) by mouth daily. 05/12/19   Montravious Weigelt C, PA-C  thiamine (VITAMIN B-1) 100 MG tablet Take 100 mg by mouth daily.    [provider]  traMADol (ULTRAM) 50 MG tablet Take 1 tablet (50 mg total) by mouth every 6 (six) hours as needed. 01/07/16   Tharon Aquas, PA  fluticasone (FLONASE) 50 MCG/ACT nasal spray Place 1 spray into both nostrils daily.  05/12/19  [provider]  lisinopril-hydrochlorothiazide (PRINZIDE,ZESTORETIC) 20-12.5 MG per tablet Take 1 tablet by mouth daily.  05/12/19  [provider]  lovastatin (MEVACOR) 20 MG tablet Take 20 mg by mouth daily at 6 PM.  05/12/19  [provider]    Family History Family History  Problem Relation Age of Onset   Kidney failure Mother    Aneurysm Father     Social History Social History   Tobacco Use   Smoking status: Current Every Day Smoker   Smokeless tobacco: Never Used  Substance Use Topics   Alcohol use: Yes    Comment: occ   Drug use: Yes    Types: Marijuana     Allergies   Penicillins   Review of Systems Review of Systems  Constitutional: Negative for fatigue and fever.    HENT: Negative for congestion, sinus pressure and sore throat.   Eyes: Negative for photophobia, pain and visual disturbance.  Respiratory: Negative for cough and shortness of breath.   Cardiovascular: Negative for chest pain.  Gastrointestinal: Negative for abdominal pain, nausea and vomiting.  Genitourinary: Negative for decreased urine volume and hematuria.  Musculoskeletal: Negative for myalgias, neck pain and neck stiffness.  Neurological: Negative for dizziness, syncope, facial asymmetry, speech difficulty, weakness, light-headedness, numbness and headaches.     Physical Exam Triage Vital Signs ED Triage Vitals  Enc Vitals Group     BP 05/12/19 1031 (!) 164/83     Pulse Rate 05/12/19 1031 74     Resp 05/12/19 1031 16     Temp 05/12/19 1031 98.5 F (36.9 C)     Temp src --      SpO2 05/12/19 1031 100 %     Weight 05/12/19 1032 150 lb (68 kg)     Height --      Head Circumference --      Peak Flow --      Pain Score 05/12/19 1032 0     Pain Loc --      Pain Edu? --      Excl. in GC? --    No data found.  Updated Vital Signs BP (!) 164/83 (BP Location: Right Arm)    Pulse 74    Temp 98.5 F (36.9 C)    Resp 16    Wt 150 lb (68 kg)    SpO2 100%    BMI 24.21 kg/m   Visual Acuity Right Eye Distance:   Left Eye Distance:   Bilateral Distance:    Right Eye Near:   Left Eye Near:    Bilateral Near:     Physical Exam Vitals signs and nursing note reviewed.  Constitutional:      General: She is not in acute distress.    Appearance: She is well-developed.  HENT:     Head: Normocephalic and atraumatic.     Mouth/Throat:     Comments: Oral mucosa pink and moist, no tonsillar enlargement or exudate. Posterior pharynx patent and nonerythematous, no uvula deviation or swelling. Normal phonation. Palate elevates symmetrically Eyes:     Extraocular Movements: Extraocular movements intact.     Conjunctiva/sclera: Conjunctivae normal.     Pupils: Pupils are equal,  round, and reactive to light.     Comments: wearing glasses  Neck:     Musculoskeletal: Neck supple.  Cardiovascular:     Rate and Rhythm: Normal rate and regular rhythm.     Heart sounds: No murmur.  Pulmonary:     Effort: Pulmonary effort is normal. No respiratory distress.     Comments: Breathing comfortably at rest, expiratory breath sounds coarse, no wheezing or rales auscultated Abdominal:  Palpations: Abdomen is soft.     Tenderness: There is no abdominal tenderness.  Musculoskeletal:     Comments: Ambulates easily from chair to exam table without assistance  Skin:    General: Skin is warm and dry.  Neurological:     General: No focal deficit present.     Mental Status: She is alert and oriented to person, place, and time. Mental status is at baseline.     Cranial Nerves: No cranial nerve deficit.      UC Treatments / Results  Labs (all labs ordered are listed, but only abnormal results are displayed) Labs Reviewed - No data to display  EKG   Radiology No results found.  Procedures Procedures (including critical care time)  Medications Ordered in UC Medications - No data to display  Initial Impression / Assessment and Plan / UC Course  I have reviewed the triage vital signs and the nursing notes.  Pertinent labs & imaging results that were available during my care of the patient were reviewed by me and considered in my medical decision making (see chart for details).    Patient had receipts from recent medicines from 03/22/2019 that she received from the health department.  Use this as reference for current medicines and dosing. Refilled patient's medicines for blood pressure, cholesterol, arthritis, allergies, GERD, provided 2 months worth in order to hold patient over until her visit to establish care with primary on 10/10.  Currently asymptomatic.  Blood pressure mildly elevated today.  Continue to monitor.Discussed strict return precautions. Patient  verbalized understanding and is agreeable with plan.  Final Clinical Impressions(s) / UC Diagnoses   Final diagnoses:  Medication refill  Essential hypertension  Gastroesophageal reflux disease, unspecified whether esophagitis present  Hyperlipidemia, unspecified hyperlipidemia type     Discharge Instructions     I have refilled your medicines Follow up with primary care as planned for further refills Follow up here in the meantime if having any issues   ED Prescriptions    Medication Sig Dispense Auth. Provider   pantoprazole (PROTONIX) 40 MG tablet Take 1 tablet (40 mg total) by mouth daily. 60 tablet Naudia Crosley C, PA-C   amLODipine (NORVASC) 10 MG tablet Take 1 tablet (10 mg total) by mouth daily. 60 tablet Britani Beattie C, PA-C   loratadine (CLARITIN) 10 MG tablet Take 1 tablet (10 mg total) by mouth daily. 60 tablet Maurie Olesen C, PA-C   aspirin EC 81 MG tablet Take 1 tablet (81 mg total) by mouth daily. 60 tablet Rekha Hobbins C, PA-C   meloxicam (MOBIC) 15 MG tablet  (Status: Discontinued) Take 1 tablet (15 mg total) by mouth daily. 60 tablet Delle Andrzejewski C, PA-C   atorvastatin (LIPITOR) 80 MG tablet Take 1 tablet (80 mg total) by mouth daily. 60 tablet Tiann Saha C, PA-C   cyclobenzaprine (FLEXERIL) 10 MG tablet Take 1 tablet (10 mg total) by mouth 2 (two) times daily as needed for muscle spasms. 30 tablet Aymee Fomby C, PA-C   montelukast (SINGULAIR) 10 MG tablet Take 1 tablet (10 mg total) by mouth at bedtime. 60 tablet Beola Vasallo C, PA-C   hydrochlorothiazide (HYDRODIURIL) 25 MG tablet Take 1 tablet (25 mg total) by mouth daily. 60 tablet Kindall Swaby C, PA-C   meloxicam (MOBIC) 15 MG tablet Take 1 tablet (15 mg total) by mouth daily. 60 tablet Zeplin Aleshire, [place] C, PA-C     PDMP not reviewed this encounter.   Janith Lima, PA-C 05/12/19 1139  Catlyn Shipton, [place] C, PA-C 05/12/19 1140

## 2019-05-12 NOTE — ED Triage Notes (Signed)
Pt states she needs her meds refilled B/P med and acid reflux meds and a few others. Pt states she dose not have an appt with her PCP til Dec. Pt would like all of her meds refilled.

## 2019-06-16 ENCOUNTER — Encounter: Payer: Self-pay | Admitting: Family Medicine

## 2019-06-16 ENCOUNTER — Ambulatory Visit: Payer: Medicare Other | Attending: Family Medicine | Admitting: Family Medicine

## 2019-06-16 ENCOUNTER — Other Ambulatory Visit: Payer: Self-pay

## 2019-06-16 DIAGNOSIS — J209 Acute bronchitis, unspecified: Secondary | ICD-10-CM

## 2019-06-16 DIAGNOSIS — J440 Chronic obstructive pulmonary disease with acute lower respiratory infection: Secondary | ICD-10-CM

## 2019-06-16 MED ORDER — ALBUTEROL SULFATE HFA 108 (90 BASE) MCG/ACT IN AERS: 2.0000 | INHALATION_SPRAY | Freq: Four times a day (QID) | RESPIRATORY_TRACT | 11 refills | Status: DC | PRN

## 2019-06-16 MED ORDER — DOXYCYCLINE HYCLATE 100 MG PO TABS: 100.0000 mg | ORAL_TABLET | Freq: Two times a day (BID) | ORAL | 0 refills | Status: DC

## 2019-06-16 MED ORDER — PREDNISONE 20 MG PO TABS: ORAL_TABLET | ORAL | 0 refills | Status: DC

## 2019-06-16 NOTE — Progress Notes (Signed)
Patient verified DOB Patient has not taken medication today. Patient has not eaten today. Patient complains of cold symptoms for the past 3 weeks. Patient denies fevers, N/V. Patient has had diarrhea. Patient states mucous is green intermittently.

## 2019-06-16 NOTE — Progress Notes (Signed)
Virtual Visit via Telephone Note  I connected with Anna Mcguire on 06/16/19 at  8:50 AM EST by telephone and verified that I am speaking with the correct person using two identifiers.   I discussed the limitations, risks, security and privacy concerns of performing an evaluation and management service by telephone and the availability of in person appointments. I also discussed with the patient that there may be a patient responsible charge related to this service. The patient expressed understanding and agreed to proceed.  Patient Location: Private vehicle (she was scheduled for office visit however due to her respiratory symptoms and current COVID-19 pandemic, visit was changed to telemedicine) Provider Location: [facility] Others participating in call: Call initiated by [place], [place] who inform patient that today's visit would be by telemedicine due to patient's respiratory complaints   History of Present Illness:      63 yo female with complaint of 3 weeks of recurrent cough which was initially nonproductive but now patient with white to beige sputum along with sensation of chest congestion and mild shortness of breath.  She denies any fever or chills.  No chest pain.  She reports that she has been diagnosed with COPD in the past.  She does have long standing history of smoking/cigarette use.  She also reports history of hypertension, recurrent lower back and knee pain/osteoarthritis and acid reflux.  She denies need for any current refills of medications.  She does have some current fatigue, she denies abdominal pain-no nausea or vomiting.  No headaches or dizziness related to blood pressure.  No sore throat but does have mild nasal congestion with sensation of postnasal drainage.  No ear pain.  No current acid reflux symptoms or difficulty swallowing.   Past Medical History:  Diagnosis Date  . Acid reflux   . Arthritis   . Hypertension     Past Surgical History:  Procedure  Laterality Date  . COLONOSCOPY WITH PROPOFOL N/A 06/07/2014   Procedure: COLONOSCOPY WITH PROPOFOL;  Surgeon: Arta Silence, MD;  Location: [facility];  Service: Endoscopy;  Laterality: N/A;  . ESOPHAGOGASTRODUODENOSCOPY (EGD) WITH PROPOFOL N/A 06/07/2014   Procedure: ESOPHAGOGASTRODUODENOSCOPY (EGD) WITH PROPOFOL;  Surgeon: Arta Silence, MD;  Location: [facility];  Service: Endoscopy;  Laterality: N/A;    Family History  Problem Relation Age of Onset  . Kidney failure Mother   . Aneurysm Father     Social History   Tobacco Use  . Smoking status: Current Every Day Smoker  . Smokeless tobacco: Never Used  Substance Use Topics  . Alcohol use: Yes    Comment: occ  . Drug use: Yes    Types: Marijuana     Allergies  Allergen Reactions  . Penicillins        Observations/Objective: No vital signs or physical exam conducted as visit was done via telephone  Assessment and Plan: 1. COPD with acute bronchitis ([place]) Patient with COPD exacerbation with acute bronchitis for which she will be prescribed prednisone 40 mg daily x5 days.  She is aware that she should eat prior to taking the medication to help avoid stomach upset.  She will also be prescribed doxycycline 100 mg twice daily x10 days.  She is encouraged to obtain an over-the-counter medication such as Robitussin-DM to help with cough and chest congestion.  If she has any acute worsening of symptoms she should go to urgent care or ED for further evaluation.  She is encouraged to rest and remain well-hydrated.  Importance of  complete smoking cessation discussed with patient. - predniSONE (DELTASONE) 20 MG tablet; 2 pills once per day x 5 days; take after eating  Dispense: 10 tablet; Refill: 0 - doxycycline (VIBRA-TABS) 100 MG tablet; Take 1 tablet (100 mg total) by mouth 2 (two) times daily.  Dispense: 20 tablet; Refill: 0  Follow Up Instructions:Return for copd exac- 1-2 weeks if not better.    I discussed the assessment and  treatment plan with the patient. The patient was provided an opportunity to ask questions and all were answered. The patient agreed with the plan and demonstrated an understanding of the instructions.   The patient was advised to call back or seek an in-person evaluation if the symptoms worsen or if the condition fails to improve as anticipated.  I provided 13 minutes of non-face-to-face time during this encounter.   [name], MD

## 2019-06-20 ENCOUNTER — Other Ambulatory Visit: Payer: Self-pay | Admitting: Family Medicine

## 2019-06-20 DIAGNOSIS — J440 Chronic obstructive pulmonary disease with acute lower respiratory infection: Secondary | ICD-10-CM

## 2019-06-21 ENCOUNTER — Telehealth: Payer: Self-pay | Admitting: Family Medicine

## 2019-06-21 NOTE — Telephone Encounter (Signed)
1) Medication(s) Requested (by name): refill on all current meds   2) [facility], [address] AT [address]   3) Special Requests:   Approved medications will be sent to the pharmacy, we will reach out if there is an issue.  Requests made after 3pm may not be addressed until the following business day!  If a patient is unsure of the name of the medication(s) please note and ask patient to call back when they are able to provide all info, do not send to responsible party until all information is available!

## 2019-06-22 ENCOUNTER — Other Ambulatory Visit: Payer: Self-pay | Admitting: Pharmacist

## 2019-06-22 DIAGNOSIS — J209 Acute bronchitis, unspecified: Secondary | ICD-10-CM

## 2019-06-22 DIAGNOSIS — J440 Chronic obstructive pulmonary disease with acute lower respiratory infection: Secondary | ICD-10-CM

## 2019-06-22 MED ORDER — HYDROCHLOROTHIAZIDE 25 MG PO TABS: 25.0000 mg | ORAL_TABLET | Freq: Every day | ORAL | 0 refills | Status: DC

## 2019-06-22 MED ORDER — ALBUTEROL SULFATE HFA 108 (90 BASE) MCG/ACT IN AERS: 2.0000 | INHALATION_SPRAY | Freq: Four times a day (QID) | RESPIRATORY_TRACT | 2 refills | Status: DC | PRN

## 2019-06-22 MED ORDER — MONTELUKAST SODIUM 10 MG PO TABS: 10.0000 mg | ORAL_TABLET | Freq: Every day | ORAL | 0 refills | Status: DC

## 2019-06-22 MED ORDER — LORATADINE 10 MG PO TABS: 10.0000 mg | ORAL_TABLET | Freq: Every day | ORAL | 0 refills | Status: DC

## 2019-06-22 MED ORDER — PANTOPRAZOLE SODIUM 40 MG PO TBEC: 40.0000 mg | DELAYED_RELEASE_TABLET | Freq: Every day | ORAL | 0 refills | Status: DC

## 2019-06-22 MED ORDER — ATORVASTATIN CALCIUM 80 MG PO TABS: 80.0000 mg | ORAL_TABLET | Freq: Every day | ORAL | 0 refills | Status: DC

## 2019-06-22 MED ORDER — AMLODIPINE BESYLATE 10 MG PO TABS: 10.0000 mg | ORAL_TABLET | Freq: Every day | ORAL | 0 refills | Status: DC

## 2019-06-24 ENCOUNTER — Other Ambulatory Visit: Payer: Self-pay | Admitting: Family Medicine

## 2019-06-24 DIAGNOSIS — J449 Chronic obstructive pulmonary disease, unspecified: Secondary | ICD-10-CM

## 2019-06-24 DIAGNOSIS — J209 Acute bronchitis, unspecified: Secondary | ICD-10-CM

## 2019-06-24 DIAGNOSIS — M159 Polyosteoarthritis, unspecified: Secondary | ICD-10-CM

## 2019-06-24 DIAGNOSIS — I10 Essential (primary) hypertension: Secondary | ICD-10-CM

## 2019-06-24 DIAGNOSIS — E785 Hyperlipidemia, unspecified: Secondary | ICD-10-CM

## 2019-06-24 DIAGNOSIS — J309 Allergic rhinitis, unspecified: Secondary | ICD-10-CM

## 2019-06-24 DIAGNOSIS — J440 Chronic obstructive pulmonary disease with acute lower respiratory infection: Secondary | ICD-10-CM

## 2019-06-24 DIAGNOSIS — K219 Gastro-esophageal reflux disease without esophagitis: Secondary | ICD-10-CM

## 2019-06-24 MED ORDER — MELOXICAM 15 MG PO TABS: 15.0000 mg | ORAL_TABLET | Freq: Every day | ORAL | 0 refills | Status: DC

## 2019-06-24 MED ORDER — ALBUTEROL SULFATE HFA 108 (90 BASE) MCG/ACT IN AERS: 2.0000 | INHALATION_SPRAY | Freq: Four times a day (QID) | RESPIRATORY_TRACT | 2 refills | Status: DC | PRN

## 2019-06-24 MED ORDER — MONTELUKAST SODIUM 10 MG PO TABS: 10.0000 mg | ORAL_TABLET | Freq: Every day | ORAL | 0 refills | Status: DC

## 2019-06-24 MED ORDER — AMLODIPINE BESYLATE 10 MG PO TABS: 10.0000 mg | ORAL_TABLET | Freq: Every day | ORAL | 1 refills | Status: DC

## 2019-06-24 MED ORDER — HYDROCHLOROTHIAZIDE 25 MG PO TABS: 25.0000 mg | ORAL_TABLET | Freq: Every day | ORAL | 1 refills | Status: DC

## 2019-06-24 MED ORDER — LORATADINE 10 MG PO TABS: 10.0000 mg | ORAL_TABLET | Freq: Every day | ORAL | 1 refills | Status: DC

## 2019-06-24 MED ORDER — PANTOPRAZOLE SODIUM 40 MG PO TBEC: 40.0000 mg | DELAYED_RELEASE_TABLET | Freq: Every day | ORAL | 1 refills | Status: DC

## 2019-06-24 MED ORDER — CYCLOBENZAPRINE HCL 10 MG PO TABS: 10.0000 mg | ORAL_TABLET | Freq: Every day | ORAL | 3 refills | Status: DC

## 2019-06-24 MED ORDER — ATORVASTATIN CALCIUM 80 MG PO TABS: 80.0000 mg | ORAL_TABLET | Freq: Every day | ORAL | 1 refills | Status: DC

## 2019-06-24 NOTE — Telephone Encounter (Signed)
Medication refills have been sent to [facility] on [address]

## 2019-06-24 NOTE — Progress Notes (Signed)
Patient ID: Anna Mcguire, female   DOB: Apr 05, 1956, 63 y.o.   MRN: [id number]   Phone message received from patient requesting refills of all current medications to [facility] on [address].

## 2019-06-24 NOTE — Telephone Encounter (Signed)
Called patient and daughter Anna Mcguire picked up stated she is not with patient right now. Informed daughter to please have patient call office and she agreed and verbalized understanding.

## 2019-07-12 ENCOUNTER — Telehealth: Payer: Self-pay

## 2019-07-12 NOTE — Telephone Encounter (Signed)
At request of Dr Jillyn Hidden, attempted to contact the patient # [phone] to schedule a follow up appointment, message left with call back requested to this CM.

## 2019-07-13 ENCOUNTER — Telehealth: Payer: Self-pay

## 2019-07-13 NOTE — Telephone Encounter (Signed)
Attempted again to contact patient # [phone] to discuss scheduling a follow up appt with Dr Jillyn Hidden.  Message left with call back requested to this CM

## 2019-07-20 ENCOUNTER — Telehealth: Payer: Self-pay

## 2019-07-20 NOTE — Telephone Encounter (Signed)
Reviewed

## 2019-07-20 NOTE — Telephone Encounter (Signed)
Call placed to patient, her daughter, Aggie Cosier answered and said to call her mother at # [phone].  Call placed to patient and scheduled her for an appt with Dr Jillyn Hidden   - 1/22/20221 @ 1510. She said that she uses the bus for transportation.  Also provided her with the number to call medicaid for an assessment to provide transportation to medical appointments.

## 2019-07-29 ENCOUNTER — Encounter: Payer: Self-pay | Admitting: Family Medicine

## 2019-07-29 ENCOUNTER — Ambulatory Visit: Payer: Medicare Other | Attending: Family Medicine | Admitting: Family Medicine

## 2019-07-29 ENCOUNTER — Other Ambulatory Visit: Payer: Self-pay

## 2019-07-29 VITALS — BP 136/80 | HR 112 | Ht 67.0 in | Wt 127.0 lb

## 2019-07-29 DIAGNOSIS — J441 Chronic obstructive pulmonary disease with (acute) exacerbation: Secondary | ICD-10-CM | POA: Diagnosis not present

## 2019-07-29 DIAGNOSIS — Z7982 Long term (current) use of aspirin: Secondary | ICD-10-CM | POA: Insufficient documentation

## 2019-07-29 DIAGNOSIS — E785 Hyperlipidemia, unspecified: Secondary | ICD-10-CM | POA: Diagnosis not present

## 2019-07-29 DIAGNOSIS — M2041 Other hammer toe(s) (acquired), right foot: Secondary | ICD-10-CM | POA: Insufficient documentation

## 2019-07-29 DIAGNOSIS — I10 Essential (primary) hypertension: Secondary | ICD-10-CM | POA: Diagnosis not present

## 2019-07-29 DIAGNOSIS — M25511 Pain in right shoulder: Secondary | ICD-10-CM

## 2019-07-29 DIAGNOSIS — Z7901 Long term (current) use of anticoagulants: Secondary | ICD-10-CM | POA: Diagnosis not present

## 2019-07-29 DIAGNOSIS — M79674 Pain in right toe(s): Secondary | ICD-10-CM

## 2019-07-29 DIAGNOSIS — F17210 Nicotine dependence, cigarettes, uncomplicated: Secondary | ICD-10-CM | POA: Insufficient documentation

## 2019-07-29 DIAGNOSIS — Z79899 Other long term (current) drug therapy: Secondary | ICD-10-CM

## 2019-07-29 DIAGNOSIS — M1990 Unspecified osteoarthritis, unspecified site: Secondary | ICD-10-CM | POA: Diagnosis not present

## 2019-07-29 DIAGNOSIS — Z791 Long term (current) use of non-steroidal anti-inflammatories (NSAID): Secondary | ICD-10-CM | POA: Diagnosis not present

## 2019-07-29 DIAGNOSIS — R05 Cough: Secondary | ICD-10-CM | POA: Insufficient documentation

## 2019-07-29 DIAGNOSIS — F17200 Nicotine dependence, unspecified, uncomplicated: Secondary | ICD-10-CM

## 2019-07-29 DIAGNOSIS — R7989 Other specified abnormal findings of blood chemistry: Secondary | ICD-10-CM

## 2019-07-29 DIAGNOSIS — R739 Hyperglycemia, unspecified: Secondary | ICD-10-CM

## 2019-07-29 DIAGNOSIS — K219 Gastro-esophageal reflux disease without esophagitis: Secondary | ICD-10-CM | POA: Diagnosis not present

## 2019-07-29 DIAGNOSIS — G8929 Other chronic pain: Secondary | ICD-10-CM

## 2019-07-29 MED ORDER — DOXYCYCLINE HYCLATE 100 MG PO TABS: 100.0000 mg | ORAL_TABLET | Freq: Two times a day (BID) | ORAL | 0 refills | Status: DC

## 2019-07-29 MED ORDER — ALBUTEROL SULFATE HFA 108 (90 BASE) MCG/ACT IN AERS: 2.0000 | INHALATION_SPRAY | Freq: Four times a day (QID) | RESPIRATORY_TRACT | 2 refills | Status: DC | PRN

## 2019-07-29 MED ORDER — PREDNISONE 20 MG PO TABS: ORAL_TABLET | ORAL | 0 refills | Status: DC

## 2019-07-29 NOTE — Progress Notes (Signed)
Established Patient Office Visit  Subjective:  Patient ID: Anna Mcguire, female    DOB: 1955/11/25  Age: 64 y.o. MRN: [id number]  CC:  Chief Complaint  Patient presents with  . Hypertension    HPI Anna Mcguire, 64 year old female who is seen in follow-up of hypertension and  COPD and is status post acute exacerbation at her 06/16/2019 visit telemedicine visit to establish care. She reports that her breathing improved after treatment of her COPD at her last visit but she has had recent onset of cough and chest congestion. She denies no fever or chills. She also had occasional sputum that is beige to yellow. She continues to smoke and is not interested in quitting at this time.  She has chronic issues with pain in her right second toe do to a hammertoe that presses against her shoe and hurts whenever she walks. She has also had increased pain in her right shoulder which has hurt in the past but then recently started hurting again about 4 days ago and she has difficulty reaching overhead.         She continues to take her blood pressure and cholesterol medications daily. She feels that BP has been controlled and she is having no issues with headaches or dizziness. No increased muscle aches with use of cholesterol medication. She denies any stomach upset, nausea and no unusual bruising or bleeding with the use of daily baby aspirin.            Past Medical History:  Diagnosis Date  . Acid reflux   . Arthritis   . Hypertension     Past Surgical History:  Procedure Laterality Date  . COLONOSCOPY WITH PROPOFOL N/A 06/07/2014   Procedure: COLONOSCOPY WITH PROPOFOL;  Surgeon: Willis Modena, MD;  Location: [facility];  Service: Endoscopy;  Laterality: N/A;  . ESOPHAGOGASTRODUODENOSCOPY (EGD) WITH PROPOFOL N/A 06/07/2014   Procedure: ESOPHAGOGASTRODUODENOSCOPY (EGD) WITH PROPOFOL;  Surgeon: Willis Modena, MD;  Location: [facility];  Service: Endoscopy;  Laterality: N/A;     Family History  Problem Relation Age of Onset  . Kidney failure Mother   . Aneurysm Father     Social History   Socioeconomic History  . Marital status: Single    Spouse name: Not on file  . Number of children: Not on file  . Years of education: Not on file  . Highest education level: Not on file  Occupational History  . Not on file  Tobacco Use  . Smoking status: Current Every Day Smoker  . Smokeless tobacco: Never Used  Substance and Sexual Activity  . Alcohol use: Yes    Comment: occ  . Drug use: Yes    Types: Marijuana  . Sexual activity: Not Currently  Other Topics Concern  . Not on file  Social History Narrative  . Not on file   Social Determinants of Health   Financial Resource Strain:   . Difficulty of Paying Living Expenses: Not on file  Food Insecurity:   . Worried About Programme researcher, broadcasting/film/video in the Last Year: Not on file  . Ran Out of Food in the Last Year: Not on file  Transportation Needs:   . Lack of Transportation (Medical): Not on file  . Lack of Transportation (Non-Medical): Not on file  Physical Activity:   . Days of Exercise per Week: Not on file  . Minutes of Exercise per Session: Not on file  Stress:   . Feeling of Stress : Not on  file  Social Connections:   . Frequency of Communication with Friends and Family: Not on file  . Frequency of Social Gatherings with Friends and Family: Not on file  . Attends Religious Services: Not on file  . Active Member of Clubs or Organizations: Not on file  . Attends Banker Meetings: Not on file  . Marital Status: Not on file  Intimate Partner Violence:   . Fear of Current or Ex-Partner: Not on file  . Emotionally Abused: Not on file  . Physically Abused: Not on file  . Sexually Abused: Not on file    Outpatient Medications Prior to Visit  Medication Sig Dispense Refill  . acetaminophen (TYLENOL) 500 MG tablet Take 1 tablet (500 mg total) by mouth every 6 (six) hours as needed. 30  tablet 0  . albuterol (VENTOLIN HFA) 108 (90 Base) MCG/ACT inhaler Inhale 2 puffs into the lungs every 6 (six) hours as needed for wheezing or shortness of breath. 18 g 2  . amLODipine (NORVASC) 10 MG tablet Take 1 tablet (10 mg total) by mouth daily. To lower blood pressure 90 tablet 1  . aspirin EC 81 MG tablet Take 1 tablet (81 mg total) by mouth daily. 60 tablet 0  . atorvastatin (LIPITOR) 80 MG tablet Take 1 tablet (80 mg total) by mouth daily. 90 tablet 1  . cyclobenzaprine (FLEXERIL) 10 MG tablet Take 1 tablet (10 mg total) by mouth at bedtime. As needed for muscle spasm 30 tablet 3  . doxycycline (VIBRA-TABS) 100 MG tablet Take 1 tablet (100 mg total) by mouth 2 (two) times daily. 20 tablet 0  . hydrochlorothiazide (HYDRODIURIL) 25 MG tablet Take 1 tablet (25 mg total) by mouth daily. 90 tablet 1  . loratadine (CLARITIN) 10 MG tablet Take 1 tablet (10 mg total) by mouth daily. 90 tablet 1  . meloxicam (MOBIC) 15 MG tablet Take 1 tablet (15 mg total) by mouth daily. As needed for joint pain.  Take after eating 90 tablet 0  . montelukast (SINGULAIR) 10 MG tablet Take 1 tablet (10 mg total) by mouth at bedtime. 90 tablet 0  . pantoprazole (PROTONIX) 40 MG tablet Take 1 tablet (40 mg total) by mouth daily. 90 tablet 1  . thiamine (VITAMIN B-1) 100 MG tablet Take 100 mg by mouth daily.    . predniSONE (DELTASONE) 20 MG tablet 2 pills once per day x 5 days; take after eating (Patient not taking: Reported on 07/29/2019) 10 tablet 0  . traMADol (ULTRAM) 50 MG tablet Take 1 tablet (50 mg total) by mouth every 6 (six) hours as needed. (Patient not taking: Reported on 07/29/2019) 15 tablet 0   No facility-administered medications prior to visit.    Allergies  Allergen Reactions  . Penicillins     ROS Review of Systems  Constitutional: Positive for fatigue. Negative for chills and fever.  HENT: Positive for congestion. Negative for sore throat and trouble swallowing.   Respiratory: Positive for  cough and shortness of breath.   Cardiovascular: Negative for chest pain and palpitations.  Gastrointestinal: Negative for abdominal pain, blood in stool, constipation, diarrhea and nausea.  Endocrine: Negative for polydipsia, polyphagia and polyuria.  Genitourinary: Negative for dysuria and frequency.  Musculoskeletal: Positive for arthralgias and gait problem.  Neurological: Negative for dizziness and headaches.  Hematological: Negative for adenopathy. Does not bruise/bleed easily.      Objective:    Physical Exam  Constitutional: She is oriented to person, place, and time.  Neck:  No JVD present.  Cardiovascular: Normal rate and regular rhythm.  No carotid bruit appreciated on exam  Pulmonary/Chest: Effort normal.  Scattered rhonci in all lung fields, no increased work of breathing or accessory muscle use  Abdominal: Soft. There is no abdominal tenderness. There is no rebound and no guarding.  Musculoskeletal:        General: Tenderness present. No edema.     Cervical back: Normal range of motion and neck supple.     Comments: Right shoulder pain with overhead ROM of the right arm; discomfort with palp of right anterior/posterior lateral shoulder  Lymphadenopathy:    She has no cervical adenopathy.  Neurological: She is alert and oriented to person, place, and time.  Skin: Skin is warm and dry.  Psychiatric: She has a normal mood and affect. Her behavior is normal.  Nursing note and vitals reviewed.   BP 136/80   Pulse (!) 112   Ht 5\' 7"  (1.702 m)   Wt 127 lb (57.6 kg)   SpO2 100%   BMI 19.89 kg/m  Wt Readings from Last 3 Encounters:  07/29/19 127 lb (57.6 kg)     Health Maintenance Due  Topic Date Due  . Hepatitis C Screening  12-Aug-1955  . PAP SMEAR-Modifier  01/05/1975  . DEXA SCAN  01/05/2019    No results found for: TSH Lab Results  Component Value Date   WBC 3.6 (L) 01/31/2014   HGB 11.2 (L) 01/31/2014   HCT 34.1 (L) 01/31/2014   MCV 95.3 01/31/2014    PLT 292 01/31/2014   Lab Results  Component Value Date   NA 141 01/31/2014   K 4.1 01/31/2014   CO2 24 01/31/2014   GLUCOSE 158 (H) 01/31/2014   BUN 21 01/31/2014   CREATININE 1.06 01/31/2014   BILITOT 0.4 07/06/2008   ALKPHOS 49 07/06/2008   AST 19 07/06/2008   ALT 13 07/06/2008   PROT 7.2 07/06/2008   ALBUMIN 4.5 07/06/2008   CALCIUM 9.9 01/31/2014   ANIONGAP 12 01/31/2014   Lab Results  Component Value Date   CHOL 183 07/06/2008   Lab Results  Component Value Date   HDL 62 07/06/2008   Lab Results  Component Value Date   LDLCALC 84 07/06/2008   Lab Results  Component Value Date   TRIG 186 (H) 07/06/2008   Lab Results  Component Value Date   CHOLHDL 3.0 Ratio 07/06/2008   No results found for: HGBA1C    Assessment & Plan:  1. COPD with acute exacerbation [facility]) Patient with complaint of recent increase in cough and nasal congestion and abnormal lung noises on exam. She will be treated with doxycycline and prednisone taper and albuterol HFA refilled. She continues to smoke and has not had a recent CXR therefore order placed, CBC to look for elevated wbc.  - DG Chest 2 View; Future - CBC  2. Essential hypertension Blood pressure is stable and controlled on her current medications. She does not need refills at today's visit,  3. Acute pain of right shoulder She is encouraged to take otc Tylenol as needed for pain and avoid NSAID's due to her long term aspirin use. She will be referred to Orthopedics for further evaluation and treatment.  - AMB referral to orthopedics  4. Toe pain, chronic, right Patient with a right second painful hammertoe.  She was  encouraged to try using unmedicated toe doughnut/callus cushion  to avoid pressure from shoes against her toe and referral placed for her to  follow-up with podiatry.  - Ambulatory referral to Podiatry  5. Elevated blood sugar level She has had elevated blood sugar on past labs and she will have glucose  rechecked as part of CMET and will check Hgb A1c to see if she may be pre-diabetic or diabetic.  - Comprehensive metabolic panel - Hemoglobin A1C  6. Hyperlipidemia, unspecified hyperlipidemia type Continue atorvastatin and a low fat diet. - Comprehensive metabolic panel  7. Encounter for long-term current use of medication Will check electrolytes and liver enzymes in follow-up of her use of statin medications as well as blood pressure medications. Hgb A1c as some of her medications can also increase the risk of diabetes.  - Comprehensive metabolic panel - Hemoglobin A1C  8. Chronic obstructive pulmonary disease, unspecified COPD type (HCC); 9. Tobacco dependence Complete smoking cessation encouraged and handout provided on "Quitting Smoking" as part of her After Visit Summary. She is also to have upcoming chest x-ray. - albuterol (VENTOLIN HFA) 108 (90 Base) MCG/ACT inhaler; Inhale 2 puffs into the lungs every 6 (six) hours as needed for wheezing or shortness of breath.  Dispense: 18 g; Refill: 2  An After Visit Summary was printed and given to the patient.  Follow-up:  Return in about 4 months (around 11/26/2019) for chronic issues; 1-2 weeks if breathing is not better.   [name], MD

## 2019-07-29 NOTE — Patient Instructions (Signed)
Steps to Quit Smoking Smoking tobacco is the leading cause of preventable death. It can affect almost every organ in the body. Smoking puts you and people around you at risk for many serious, long-lasting (chronic) diseases. Quitting smoking can be hard, but it is one of the best things that you can do for your health. It is never too late to quit. How do I get ready to quit? When you decide to quit smoking, make a plan to help you succeed. Before you quit:  Pick a date to quit. Set a date within the next 2 weeks to give you time to prepare.  Write down the reasons why you are quitting. Keep this list in places where you will see it often.  Tell your family, friends, and co-workers that you are quitting. Their support is important.  Talk with your doctor about the choices that may help you quit.  Find out if your health insurance will pay for these treatments.  Know the people, places, things, and activities that make you want to smoke (triggers). Avoid them. What first steps can I take to quit smoking?  Throw away all cigarettes at home, at work, and in your car.  Throw away the things that you use when you smoke, such as ashtrays and lighters.  Clean your car. Make sure to empty the ashtray.  Clean your home, including curtains and carpets. What can I do to help me quit smoking? Talk with your doctor about taking medicines and seeing a counselor at the same time. You are more likely to succeed when you do both.  If you are pregnant or breastfeeding, talk with your doctor about counseling or other ways to quit smoking. Do not take medicine to help you quit smoking unless your doctor tells you to do so. To quit smoking: Quit right away  Quit smoking totally, instead of slowly cutting back on how much you smoke over a period of time.  Go to counseling. You are more likely to quit if you go to counseling sessions regularly. Take medicine You may take medicines to help you quit. Some  medicines need a prescription, and some you can buy over-the-counter. Some medicines may contain a drug called nicotine to replace the nicotine in cigarettes. Medicines may:  Help you to stop having the desire to smoke (cravings).  Help to stop the problems that come when you stop smoking (withdrawal symptoms). Your doctor may ask you to use:  Nicotine patches, gum, or lozenges.  Nicotine inhalers or sprays.  Non-nicotine medicine that is taken by mouth. Find resources Find resources and other ways to help you quit smoking and remain smoke-free after you quit. These resources are most helpful when you use them often. They include:  Online chats with a counselor.  Phone quitlines.  Printed self-help materials.  Support groups or group counseling.  Text messaging programs.  Mobile phone apps. Use apps on your mobile phone or tablet that can help you stick to your quit plan. There are many free apps for mobile phones and tablets as well as websites. Examples include Quit Guide from the CDC and smokefree.gov  What things can I do to make it easier to quit?   Talk to your family and friends. Ask them to support and encourage you.  Call a phone quitline (1-800-QUIT-NOW), reach out to support groups, or work with a counselor.  Ask people who smoke to not smoke around you.  Avoid places that make you want to smoke,   such as: ? Bars. ? Parties. ? Smoke-break areas at work.  Spend time with people who do not smoke.  Lower the stress in your life. Stress can make you want to smoke. Try these things to help your stress: ? Getting regular exercise. ? Doing deep-breathing exercises. ? Doing yoga. ? Meditating. ? Doing a body scan. To do this, close your eyes, focus on one area of your body at a time from head to toe. Notice which parts of your body are tense. Try to relax the muscles in those areas. How will I feel when I quit smoking? Day 1 to 3 weeks Within the first 24 hours,  you may start to have some problems that come from quitting tobacco. These problems are very bad 2-3 days after you quit, but they do not often last for more than 2-3 weeks. You may get these symptoms:  Mood swings.  Feeling restless, nervous, angry, or annoyed.  Trouble concentrating.  Dizziness.  Strong desire for high-sugar foods and nicotine.  Weight gain.  Trouble pooping (constipation).  Feeling like you may vomit (nausea).  Coughing or a sore throat.  Changes in how the medicines that you take for other issues work in your body.  Depression.  Trouble sleeping (insomnia). Week 3 and afterward After the first 2-3 weeks of quitting, you may start to notice more positive results, such as:  Better sense of smell and taste.  Less coughing and sore throat.  Slower heart rate.  Lower blood pressure.  Clearer skin.  Better breathing.  Fewer sick days. Quitting smoking can be hard. Do not give up if you fail the first time. Some people need to try a few times before they succeed. Do your best to stick to your quit plan, and talk with your doctor if you have any questions or concerns. Summary  Smoking tobacco is the leading cause of preventable death. Quitting smoking can be hard, but it is one of the best things that you can do for your health.  When you decide to quit smoking, make a plan to help you succeed.  Quit smoking right away, not slowly over a period of time.  When you start quitting, seek help from your doctor, family, or friends. This information is not intended to replace advice given to you by your health care provider. Make sure you discuss any questions you have with your health care provider. Document Revised: 03/18/2019 Document Reviewed: 09/11/2018 Elsevier Patient Education  2020 [employer] Inc.  

## 2019-07-31 LAB — COMPREHENSIVE METABOLIC PANEL WITH GFR
ALT: 19 IU/L (ref 0–32)
AST: 25 IU/L (ref 0–40)
Albumin/Globulin Ratio: 2.2 (ref 1.2–2.2)
Albumin: 4.9 g/dL — ABNORMAL HIGH (ref 3.8–4.8)
Alkaline Phosphatase: 129 IU/L — ABNORMAL HIGH (ref 39–117)
BUN/Creatinine Ratio: 9 — ABNORMAL LOW (ref 12–28)
BUN: 18 mg/dL (ref 8–27)
Bilirubin Total: 0.3 mg/dL (ref 0.0–1.2)
CO2: 20 mmol/L (ref 20–29)
Calcium: 11 mg/dL — ABNORMAL HIGH (ref 8.7–10.3)
Chloride: 91 mmol/L — ABNORMAL LOW (ref 96–106)
Creatinine, Ser: 1.94 mg/dL — ABNORMAL HIGH (ref 0.57–1.00)
GFR calc Af Amer: 31 mL/min/1.73 — ABNORMAL LOW
GFR calc non Af Amer: 27 mL/min/1.73 — ABNORMAL LOW
Globulin, Total: 2.2 g/dL (ref 1.5–4.5)
Glucose: 125 mg/dL — ABNORMAL HIGH (ref 65–99)
Potassium: 3.5 mmol/L (ref 3.5–5.2)
Sodium: 132 mmol/L — ABNORMAL LOW (ref 134–144)
Total Protein: 7.1 g/dL (ref 6.0–8.5)

## 2019-07-31 LAB — CBC
Hematocrit: 33.8 % — ABNORMAL LOW (ref 34.0–46.6)
Hemoglobin: 11.6 g/dL (ref 11.1–15.9)
MCH: 32.3 pg (ref 26.6–33.0)
MCHC: 34.3 g/dL (ref 31.5–35.7)
MCV: 94 fL (ref 79–97)
Platelets: 305 x10E3/uL (ref 150–450)
RBC: 3.59 x10E6/uL — ABNORMAL LOW (ref 3.77–5.28)
RDW: 12.2 % (ref 11.7–15.4)
WBC: 7.7 x10E3/uL (ref 3.4–10.8)

## 2019-07-31 LAB — HEMOGLOBIN A1C
Est. average glucose Bld gHb Est-mCnc: 103 mg/dL
Hgb A1c MFr Bld: 5.2 % (ref 4.8–5.6)

## 2019-07-31 NOTE — Addendum Note (Signed)
Addended by: Candi Leash on: 07/31/2019 03:06 PM   Modules accepted: Orders

## 2019-08-03 ENCOUNTER — Telehealth: Payer: Self-pay | Admitting: *Deleted

## 2019-08-03 NOTE — Telephone Encounter (Signed)
Patient verified DOB Patient is aware of creatine level/glucose/calcium being elevated. Patient advised to increase water intake and patient was scheduled for Friday 1/29 at 10:00 am for repeat BMP.

## 2019-08-03 NOTE — Telephone Encounter (Signed)
-----   Message from [name], MD sent at 07/31/2019  3:06 PM EST ----- Creatinine elevated at 1.94- she needs to increase daily water intake and repeat BMP on Wed or Thursday of this week if possible. Glucose of 125.  Calcium increased at 11.  CBC within normal.  Hgb A1c indicates that blood sugars have been normal over the last 90 days.

## 2019-08-04 ENCOUNTER — Ambulatory Visit: Payer: Medicare Other | Admitting: Podiatry

## 2019-08-05 ENCOUNTER — Other Ambulatory Visit: Payer: Medicare Other

## 2019-08-09 ENCOUNTER — Other Ambulatory Visit: Payer: Medicare Other

## 2019-08-10 ENCOUNTER — Ambulatory Visit (INDEPENDENT_AMBULATORY_CARE_PROVIDER_SITE_OTHER): Payer: Medicare Other | Admitting: Podiatry

## 2019-08-10 ENCOUNTER — Ambulatory Visit (INDEPENDENT_AMBULATORY_CARE_PROVIDER_SITE_OTHER): Payer: Medicare Other

## 2019-08-10 ENCOUNTER — Encounter: Payer: Self-pay | Admitting: Podiatry

## 2019-08-10 ENCOUNTER — Other Ambulatory Visit: Payer: Self-pay

## 2019-08-10 VITALS — BP 145/82 | HR 92 | Temp 97.5°F | Resp 16

## 2019-08-10 DIAGNOSIS — M79675 Pain in left toe(s): Secondary | ICD-10-CM | POA: Diagnosis not present

## 2019-08-10 DIAGNOSIS — M2041 Other hammer toe(s) (acquired), right foot: Secondary | ICD-10-CM

## 2019-08-10 DIAGNOSIS — M79674 Pain in right toe(s): Secondary | ICD-10-CM | POA: Diagnosis not present

## 2019-08-10 DIAGNOSIS — B351 Tinea unguium: Secondary | ICD-10-CM | POA: Diagnosis not present

## 2019-08-10 DIAGNOSIS — M2042 Other hammer toe(s) (acquired), left foot: Secondary | ICD-10-CM

## 2019-08-10 NOTE — Patient Instructions (Signed)
Hammer Toe  Hammer toe is a change in the shape (a deformity) of your toe. The deformity causes the middle joint of your toe to stay bent. This causes pain, especially when you are wearing shoes. Hammer toe starts gradually. At first, the toe can be straightened. Gradually over time, the deformity becomes stiff and permanent. Early treatments to keep the toe straight may relieve pain. As the deformity becomes stiff and permanent, surgery may be needed to straighten the toe. What are the causes? Hammer toe is caused by abnormal bending of the toe joint that is closest to your foot. It happens gradually over time. This pulls on the muscles and connections (tendons) of the toe joint, making them weak and stiff. It is often related to wearing shoes that are too short or narrow and do not let your toes straighten. What increases the risk? You may be at greater risk for hammer toe if you:  Are female.  Are older.  Wear shoes that are too small.  Wear high-heeled shoes that pinch your toes.  Are a ballet dancer.  Have a second toe that is longer than your big toe (first toe).  Injure your foot or toe.  Have arthritis.  Have a family history of hammer toe.  Have a nerve or muscle disorder. What are the signs or symptoms? The main symptoms of this condition are pain and deformity of the toe. The pain is worse when wearing shoes, walking, or running. Other symptoms may include:  Corns or calluses over the bent part of the toe or between the toes.  Redness and a burning feeling on the toe.  An open sore that forms on the top of the toe.  Not being able to straighten the toe. How is this diagnosed? This condition is diagnosed based on your symptoms and a physical exam. During the exam, your health care provider will try to straighten your toe to see how stiff the deformity is. You may also have tests, such as:  A blood test to check for rheumatoid arthritis.  An X-ray to show how  severe the deformity is. How is this treated? Treatment for this condition will depend on how stiff the deformity is. Surgery is often needed. However, sometimes a hammer toe can be straightened without surgery. Treatments that do not involve surgery include:  Taping the toe into a straightened position.  Using pads and cushions to protect the toe (orthotics).  Wearing shoes that provide enough room for the toes.  Doing toe-stretching exercises at home.  Taking an NSAID to reduce pain and swelling. If these treatments do not help or the toe cannot be straightened, surgery is the next option. The most common surgeries used to straighten a hammer toe include:  Arthroplasty. In this procedure, part of the joint is removed, and that allows the toe to straighten.  Fusion. In this procedure, cartilage between the two bones of the joint is taken out and the bones are fused together into one longer bone.  Implantation. In this procedure, part of the bone is removed and replaced with an implant to let the toe move again.  Flexor tendon transfer. In this procedure, the tendons that curl the toes down (flexor tendons) are repositioned. Follow these instructions at home:  Take over-the-counter and prescription medicines only as told by your health care provider.  Do toe straightening and stretching exercises as told by your health care provider.  Keep all follow-up visits as told by your health care   provider. This is important. How is this prevented?  Wear shoes that give your toes enough room and do not cause pain.  Do not wear high-heeled shoes. Contact a health care provider if:  Your pain gets worse.  Your toe becomes red or swollen.  You develop an open sore on your toe. This information is not intended to replace advice given to you by your health care provider. Make sure you discuss any questions you have with your health care provider. Document Revised: 06/05/2017 Document  Reviewed: 10/17/2015 Elsevier Patient Education  2020 [employer]  

## 2019-08-10 NOTE — Progress Notes (Signed)
   Subjective:    Patient ID: Anna Mcguire, female    DOB: 1955-07-25, 64 y.o.   MRN: [id number]  HPI    Review of Systems  All other systems reviewed and are negative.      Objective:   Physical Exam        Assessment & Plan:

## 2019-08-10 NOTE — Progress Notes (Signed)
Subjective:   Patient ID: Anna Mcguire, female   DOB: 64 y.o.   MRN: [id number]   HPI Patient presents stating she is got significant digital deformities right over left and has pain in her toes and has nails that she cannot cut herself they are very thick and they are painful when palpated.  Patient states this is been ongoing and becoming more of an issue for her and she smokes half pack per day and is not active   Review of Systems  All other systems reviewed and are negative.       Objective:  Physical Exam Vitals and nursing note reviewed.  Constitutional:      Appearance: She is well-developed.  Pulmonary:     Effort: Pulmonary effort is normal.  Musculoskeletal:        General: Normal range of motion.  Skin:    General: Skin is warm.  Neurological:     Mental Status: She is alert.     Neurovascular status was found to be intact muscle strength was found to be adequate range of motion within normal limits.  Patient is found to have thick dystrophic nailbeds 1 through 5 of both feet that are painful when pressed and has digital deformities right over left plantar flexion of the third digit with distal keratotic lesion which is painful when pressed.  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  Patient is found to have severely thickened brittle painful mycotic nail infections 1-5 both the     Plan:  H&P reviewed conditions and I went ahead and I educated on hammertoes and discussed possible digital fusions and today I debrided nailbeds debrided lesions and applied buttress pad right.  Reappoint as symptoms indicate  X-rays indicate that there is significant digital deformities of the lesser digits right over left

## 2019-08-11 ENCOUNTER — Encounter: Payer: Self-pay | Admitting: Orthopaedic Surgery

## 2019-08-11 ENCOUNTER — Ambulatory Visit (INDEPENDENT_AMBULATORY_CARE_PROVIDER_SITE_OTHER): Payer: Medicare Other | Admitting: Orthopaedic Surgery

## 2019-08-11 ENCOUNTER — Ambulatory Visit (INDEPENDENT_AMBULATORY_CARE_PROVIDER_SITE_OTHER): Payer: Medicare Other

## 2019-08-11 ENCOUNTER — Other Ambulatory Visit: Payer: Self-pay

## 2019-08-11 VITALS — Ht 67.0 in | Wt 145.0 lb

## 2019-08-11 DIAGNOSIS — G8929 Other chronic pain: Secondary | ICD-10-CM | POA: Diagnosis not present

## 2019-08-11 DIAGNOSIS — M25511 Pain in right shoulder: Secondary | ICD-10-CM | POA: Insufficient documentation

## 2019-08-11 MED ORDER — METHYLPREDNISOLONE ACETATE 40 MG/ML IJ SUSP
80.0000 mg | INTRAMUSCULAR | Status: AC | PRN
  Administered 2019-08-11: 12:00:00 80 mg via INTRA_ARTICULAR

## 2019-08-11 MED ORDER — LIDOCAINE HCL 2 % IJ SOLN
2.0000 mL | INTRAMUSCULAR | Status: AC | PRN
  Administered 2019-08-11: 12:00:00 2 mL

## 2019-08-11 MED ORDER — BUPIVACAINE HCL 0.5 % IJ SOLN
2.0000 mL | INTRAMUSCULAR | Status: AC | PRN
  Administered 2019-08-11: 12:00:00 2 mL via INTRA_ARTICULAR

## 2019-08-11 NOTE — Progress Notes (Signed)
Office Visit Note   Patient: Anna Mcguire           Date of Birth: 1955/12/20           MRN: [id number] Visit Date: 08/11/2019              Requested by: Antony Blackbird, MD [place],  [place] [address] PCP: Antony Blackbird, MD   Assessment & Plan: Visit Diagnoses:  1. Chronic right shoulder pain     Plan: Impingement syndrome right shoulder with the possibility of a chronic rotator cuff tear.  Will inject the subacromial space with cortisone and monitor response over the next 4 weeks or so.  Consider MRI scan if no improvement  Follow-Up Instructions: Return if symptoms worsen or fail to improve.   Orders:  Orders Placed This Encounter  Procedures  . Large Joint Inj: R subacromial bursa  . XR Shoulder Right   No orders of the defined types were placed in this encounter.     Procedures: Large Joint Inj: R subacromial bursa on 08/11/2019 12:02 PM Indications: pain and diagnostic evaluation Details: 25 G 1.5 in needle, anterolateral approach  Arthrogram: No  Medications: 2 mL lidocaine 2 %; 2 mL bupivacaine 0.5 %; 80 mg methylPREDNISolone acetate 40 MG/ML Consent was given by the patient. Immediately prior to procedure a time out was called to verify the correct patient, procedure, equipment, support staff and site/side marked as required. Patient was prepped and draped in the usual sterile fashion.       Clinical Data: No additional findings.   Subjective: Chief Complaint  Patient presents with  . Right Shoulder - Pain  Patient presents today for right shoulder pain. She said that it has been hurting for at least 5 years.. The pain has progressively worsened. She did remember falling a month ago and landing on her right side, but states that it was hurting before that as well. She does have numbness and tingling in her arm occasionally. She does state that her right arm feels weaker. She is right hand dominant. She takes Meloxicam for pain. She  has limited range of motion. No previous shoulder surgery on the right side.   HPI  Review of Systems   Objective: Vital Signs: Ht 5\' 7"  (1.702 m)   Wt 145 lb (65.8 kg)   BMI 22.71 kg/m   Physical Exam Constitutional:      Appearance: She is well-developed.  Eyes:     Pupils: Pupils are equal, round, and reactive to light.  Pulmonary:     Effort: Pulmonary effort is normal.  Skin:    General: Skin is warm and dry.  Neurological:     Mental Status: She is alert and oriented to person, place, and time.  Psychiatric:        Behavior: Behavior normal.     Ortho Exam awake alert and oriented x3.  Comfortable sitting.  Is able to place her right arm fully overhead but with a slightly circuitous motion.  Positive empty can and impingement testing.  Biceps intact.  Good strength.  Good grip and release.  No pain with range of motion of the cervical spine.  No loss of internal/external rotation  Specialty Comments:  No specialty comments available.  Imaging: DG Foot 2 Views Left  Result Date: 08/10/2019 Please see detailed radiograph report in office note.  DG Foot 2 Views Right  Result Date: 08/10/2019 Please see detailed radiograph report in office note.  XR  Shoulder Right  Result Date: 08/11/2019 Films of the right shoulder obtained in several projections.  There is some elevation of the humeral head in relationship to the glenoid but a normal space between the humeral head and the acromion that I measured about 8 mm.  No ectopic calcification.  No acute change.  Degenerative changes at the acromioclavicular joint without much hypertrophy    PMFS History: Patient Active Problem List   Diagnosis Date Noted  . Pain in right shoulder 08/11/2019   Past Medical History:  Diagnosis Date  . Acid reflux   . Arthritis   . Hypertension     Family History  Problem Relation Age of Onset  . Kidney failure Mother   . Aneurysm Father     Past Surgical History:  Procedure  Laterality Date  . COLONOSCOPY WITH PROPOFOL N/A 06/07/2014   Procedure: COLONOSCOPY WITH PROPOFOL;  Surgeon: Willis Modena, MD;  Location: [facility];  Service: Endoscopy;  Laterality: N/A;  . ESOPHAGOGASTRODUODENOSCOPY (EGD) WITH PROPOFOL N/A 06/07/2014   Procedure: ESOPHAGOGASTRODUODENOSCOPY (EGD) WITH PROPOFOL;  Surgeon: Willis Modena, MD;  Location: [facility];  Service: Endoscopy;  Laterality: N/A;   Social History   Occupational History  . Not on file  Tobacco Use  . Smoking status: Current Every Day Smoker  . Smokeless tobacco: Never Used  Substance and Sexual Activity  . Alcohol use: Yes    Comment: occ  . Drug use: Yes    Types: Marijuana  . Sexual activity: Not Currently

## 2019-08-12 ENCOUNTER — Ambulatory Visit: Payer: Medicare Other | Attending: Family Medicine

## 2019-08-12 DIAGNOSIS — R7989 Other specified abnormal findings of blood chemistry: Secondary | ICD-10-CM

## 2019-08-13 LAB — BASIC METABOLIC PANEL WITH GFR
BUN/Creatinine Ratio: 13 (ref 12–28)
BUN: 15 mg/dL (ref 8–27)
CO2: 19 mmol/L — ABNORMAL LOW (ref 20–29)
Calcium: 10.8 mg/dL — ABNORMAL HIGH (ref 8.7–10.3)
Chloride: 94 mmol/L — ABNORMAL LOW (ref 96–106)
Creatinine, Ser: 1.2 mg/dL — ABNORMAL HIGH (ref 0.57–1.00)
GFR calc Af Amer: 55 mL/min/1.73 — ABNORMAL LOW
GFR calc non Af Amer: 48 mL/min/1.73 — ABNORMAL LOW
Glucose: 169 mg/dL — ABNORMAL HIGH (ref 65–99)
Potassium: 3.7 mmol/L (ref 3.5–5.2)
Sodium: 135 mmol/L (ref 134–144)

## 2019-08-24 ENCOUNTER — Ambulatory Visit: Payer: Medicare Other | Admitting: Orthopaedic Surgery

## 2019-09-08 ENCOUNTER — Other Ambulatory Visit: Payer: Self-pay

## 2019-09-08 ENCOUNTER — Encounter: Payer: Self-pay | Admitting: Orthopaedic Surgery

## 2019-09-08 ENCOUNTER — Ambulatory Visit (INDEPENDENT_AMBULATORY_CARE_PROVIDER_SITE_OTHER): Payer: Medicare Other | Admitting: Orthopaedic Surgery

## 2019-09-08 ENCOUNTER — Ambulatory Visit (INDEPENDENT_AMBULATORY_CARE_PROVIDER_SITE_OTHER): Payer: Medicare Other

## 2019-09-08 VITALS — Ht 66.5 in | Wt 130.0 lb

## 2019-09-08 DIAGNOSIS — M1712 Unilateral primary osteoarthritis, left knee: Secondary | ICD-10-CM | POA: Diagnosis not present

## 2019-09-08 DIAGNOSIS — M25562 Pain in left knee: Secondary | ICD-10-CM

## 2019-09-08 DIAGNOSIS — G8929 Other chronic pain: Secondary | ICD-10-CM

## 2019-09-08 MED ORDER — LIDOCAINE HCL 1 % IJ SOLN
2.0000 mL | INTRAMUSCULAR | Status: AC | PRN
  Administered 2019-09-08: 2 mL

## 2019-09-08 MED ORDER — BUPIVACAINE HCL 0.25 % IJ SOLN
2.0000 mL | INTRAMUSCULAR | Status: AC | PRN
  Administered 2019-09-08: 2 mL via INTRA_ARTICULAR

## 2019-09-08 MED ORDER — METHYLPREDNISOLONE ACETATE 40 MG/ML IJ SUSP
80.0000 mg | INTRAMUSCULAR | Status: AC | PRN
  Administered 2019-09-08: 80 mg via INTRA_ARTICULAR

## 2019-09-08 NOTE — Progress Notes (Signed)
Office Visit Note   Patient: Anna Mcguire           Date of Birth: 1956-01-20           MRN: [id number] Visit Date: 09/08/2019              Requested by: [name], MD [address] [place],  [place] [address] PCP: [name], MD   Assessment & Plan: Visit Diagnoses:  1. Chronic pain of left knee   2. Unilateral primary osteoarthritis, left knee     Plan:  #1: At this time we will going do a corticosteroid injection to the left knee. #2: At this time she is not interested in any further treatment in regards to her left knee. However we will see how this injection helps and if not then she may return and we can consider that of a total knee replacement. We attempted to try to brace her to decrease the amount of valgus deformity but the brace was not acceptable and was putting pressure and adequately. #3: In regards to her foot our plan would be to her have her return to her podiatrist who has evaluated her in the past 1-2 months for surgical intervention.  Follow-Up Instructions: Return if symptoms worsen or fail to improve.   Orders:  Orders Placed This Encounter  Procedures  . Large Joint Inj  . XR KNEE 3 VIEW LEFT   No orders of the defined types were placed in this encounter.     Procedures: Large Joint Inj: L knee on 09/08/2019 2:23 PM Indications: pain and diagnostic evaluation Details: 25 G 1.5 in needle, anteromedial approach  Arthrogram: No  Medications: 2 mL lidocaine 1 %; 80 mg methylPREDNISolone acetate 40 MG/ML; 2 mL bupivacaine 0.25 % Outcome: tolerated well, no immediate complications Procedure, treatment alternatives, risks and benefits explained, specific risks discussed. Consent was given by the patient. Patient was prepped and draped in the usual sterile fashion.       Clinical Data: No additional findings.   Subjective: Chief Complaint  Patient presents with  . Left Knee - Pain  . Right Foot - Pain  Patient presents today  for left knee pain. She said that it has been hurting for 10years. The pain is located anteriorly and hurts constantly. She said that it swells, pops, gives way, and grinds. She said that she falls due to her knee giving way. She takes Meloxicam daily and states that it helps. She also takes a muscle relaxer and gets some relief.  She also has complaints of pain in her right foot. She said that her third toe crosses over the second toe on her right foot and causes her pain. No previous surgery for her foot.   HPI  Review of Systems  Constitutional: Negative for fatigue.  HENT: Negative for ear pain.   Eyes: Negative for pain.  Respiratory: Positive for shortness of breath.   Cardiovascular: Negative for leg swelling.  Gastrointestinal: Positive for constipation. Negative for diarrhea.  Endocrine: Negative for cold intolerance and heat intolerance.  Genitourinary: Positive for difficulty urinating.  Musculoskeletal: Positive for joint swelling.  Skin: Negative for rash.  Allergic/Immunologic: Negative for food allergies.  Neurological: Positive for weakness.  Hematological: Does not bruise/bleed easily.  Psychiatric/Behavioral: Negative for sleep disturbance.     Objective: Vital Signs: Ht 5' 6.5" (1.689 m)   Wt 130 lb (59 kg)   BMI 20.67 kg/m   Physical Exam Constitutional:  Appearance: She is well-developed.  Eyes:     Pupils: Pupils are equal, round, and reactive to light.  Pulmonary:     Effort: Pulmonary effort is normal.  Skin:    General: Skin is warm and dry.  Neurological:     Mental Status: She is alert and oriented to person, place, and time.  Psychiatric:        Behavior: Behavior normal.     Ortho Exam  Specialty Comments:  No specialty comments available.  Imaging: XR KNEE 3 VIEW LEFT  Result Date: 09/08/2019 X-ray of the left knee reveals a valgus deformity of about 20 to 24 degrees. She has obliteration of the lateral compartment on standing  film. She does have degenerative changes in all 3 compartments.    PMFS History: Patient Active Problem List   Diagnosis Date Noted  . Primary osteoarthritis of left knee 09/08/2019  . Pain in right shoulder 08/11/2019   Past Medical History:  Diagnosis Date  . Acid reflux   . Arthritis   . Hypertension     Family History  Problem Relation Age of Onset  . Kidney failure Mother   . Aneurysm Father     Past Surgical History:  Procedure Laterality Date  . COLONOSCOPY WITH PROPOFOL N/A 06/07/2014   Procedure: COLONOSCOPY WITH PROPOFOL;  Surgeon: Arta Silence, MD;  Location: [facility];  Service: Endoscopy;  Laterality: N/A;  . ESOPHAGOGASTRODUODENOSCOPY (EGD) WITH PROPOFOL N/A 06/07/2014   Procedure: ESOPHAGOGASTRODUODENOSCOPY (EGD) WITH PROPOFOL;  Surgeon: Arta Silence, MD;  Location: [facility];  Service: Endoscopy;  Laterality: N/A;   Social History   Occupational History  . Not on file  Tobacco Use  . Smoking status: Current Every Day Smoker  . Smokeless tobacco: Never Used  Substance and Sexual Activity  . Alcohol use: Yes    Comment: occ  . Drug use: Yes    Types: Marijuana  . Sexual activity: Not Currently

## 2019-10-06 ENCOUNTER — Ambulatory Visit (INDEPENDENT_AMBULATORY_CARE_PROVIDER_SITE_OTHER): Payer: Medicare Other | Admitting: Orthopaedic Surgery

## 2019-10-06 ENCOUNTER — Encounter: Payer: Self-pay | Admitting: Orthopaedic Surgery

## 2019-10-06 ENCOUNTER — Other Ambulatory Visit: Payer: Self-pay

## 2019-10-06 VITALS — Ht 66.5 in | Wt 130.0 lb

## 2019-10-06 DIAGNOSIS — F17200 Nicotine dependence, unspecified, uncomplicated: Secondary | ICD-10-CM | POA: Diagnosis not present

## 2019-10-06 DIAGNOSIS — Z720 Tobacco use: Secondary | ICD-10-CM | POA: Insufficient documentation

## 2019-10-06 DIAGNOSIS — M1712 Unilateral primary osteoarthritis, left knee: Secondary | ICD-10-CM | POA: Diagnosis not present

## 2019-10-06 NOTE — Progress Notes (Signed)
Office Visit Note   Patient: Anna Mcguire           Date of Birth: October 15, 1955           MRN: [id number] Visit Date: 10/06/2019              Requested by: [name], MD [address] [place],  [place] [address] PCP: [name], MD   Assessment & Plan: Visit Diagnoses:  1. Primary osteoarthritis of left knee   2. Smoking addiction     Plan: Anna Mcguire seen last month for evaluation of end-stage osteoarthritis of the left knee.  She has about a 30 degree valgus deformity with collapse of the lateral compartment.  We inject her knee with cortisone which helped for about a week or 2.  We tried bracing in the office but her knee was too thin.  I like to try a brace and will give her a prescription for biotech.  My concern with her knee replacement is that she is a chronic smoker with potential for complication.  She is being followed up with her Medicaid through community health services.  They have given her a number to call for smoking cessation.  I would like her to follow-up with that and then return in a month.  Too early for another injection  Follow-Up Instructions: Return in about 1 month (around 11/05/2019).   Orders:  No orders of the defined types were placed in this encounter.  No orders of the defined types were placed in this encounter.     Procedures: No procedures performed   Clinical Data: No additional findings.   Subjective: Chief Complaint  Patient presents with  . Left Knee - Pain  Patient presents today for a one month follow up on her left knee. She was here one month ago and received a cortisone injection in her left knee. Patient states that the injection made her knee feel a lot better. Unfortunately the injection has worn off. She is taking meloxicam for pain.  History of chronic smoking since age 26.  Presently smokes more than 10 cigarettes a day.  Has being followed by the [facility] with her Medicaid status and has been  given a number to call for smoking cessation clinic  HPI  Review of Systems  Constitutional: Negative for fatigue.  HENT: Positive for ear pain.   Eyes: Negative for pain.  Respiratory: Negative for shortness of breath.   Cardiovascular: Negative for leg swelling.  Gastrointestinal: Positive for constipation. Negative for diarrhea.  Endocrine: Negative for cold intolerance and heat intolerance.  Genitourinary: Negative for difficulty urinating.  Musculoskeletal: Positive for joint swelling.  Skin: Negative for rash.  Allergic/Immunologic: Negative for food allergies.  Neurological: Positive for weakness.  Hematological: Does not bruise/bleed easily.  Psychiatric/Behavioral: Negative for sleep disturbance.     Objective: Vital Signs: Ht 5' 6.5" (1.689 m)   Wt 130 lb (59 kg)   BMI 20.67 kg/m   Physical Exam Constitutional:      Appearance: She is well-developed.  Eyes:     Pupils: Pupils are equal, round, and reactive to light.  Pulmonary:     Effort: Pulmonary effort is normal.  Skin:    General: Skin is warm and dry.  Neurological:     Mental Status: She is alert and oriented to person, place, and time.  Psychiatric:        Behavior: Behavior normal.     Ortho Exam awake alert and  oriented x3.  With weightbearing has significant valgus of her left knee.  Very thin legs.  No effusion.  Predominately lateral joint pain left knee.  Some patellar crepitation and mild pain with patella compression.  No distal edema.  Motor exam intact.  No pain with range of motion of either hip  Specialty Comments:  No specialty comments available.  Imaging: No results found.   PMFS History: Patient Active Problem List   Diagnosis Date Noted  . Smoking addiction 10/06/2019  . Primary osteoarthritis of left knee 09/08/2019  . Pain in right shoulder 08/11/2019   Past Medical History:  Diagnosis Date  . Acid reflux   . Arthritis   . Hypertension     Family History  Problem  Relation Age of Onset  . Kidney failure Mother   . Aneurysm Father     Past Surgical History:  Procedure Laterality Date  . COLONOSCOPY WITH PROPOFOL N/A 06/07/2014   Procedure: COLONOSCOPY WITH PROPOFOL;  Surgeon: Arta Silence, MD;  Location: [facility];  Service: Endoscopy;  Laterality: N/A;  . ESOPHAGOGASTRODUODENOSCOPY (EGD) WITH PROPOFOL N/A 06/07/2014   Procedure: ESOPHAGOGASTRODUODENOSCOPY (EGD) WITH PROPOFOL;  Surgeon: Arta Silence, MD;  Location: [facility];  Service: Endoscopy;  Laterality: N/A;   Social History   Occupational History  . Not on file  Tobacco Use  . Smoking status: Current Every Day Smoker  . Smokeless tobacco: Never Used  Substance and Sexual Activity  . Alcohol use: Yes    Comment: occ  . Drug use: Yes    Types: Marijuana  . Sexual activity: Not Currently

## 2019-10-19 ENCOUNTER — Other Ambulatory Visit: Payer: Self-pay

## 2019-10-19 ENCOUNTER — Ambulatory Visit: Payer: Medicare Other | Attending: Family Medicine | Admitting: Physician Assistant

## 2019-10-19 VITALS — BP 147/79 | HR 98 | Temp 98.7°F | Ht 66.5 in | Wt 125.0 lb

## 2019-10-19 DIAGNOSIS — Z79899 Other long term (current) drug therapy: Secondary | ICD-10-CM | POA: Diagnosis not present

## 2019-10-19 DIAGNOSIS — M5442 Lumbago with sciatica, left side: Secondary | ICD-10-CM

## 2019-10-19 DIAGNOSIS — K219 Gastro-esophageal reflux disease without esophagitis: Secondary | ICD-10-CM | POA: Diagnosis not present

## 2019-10-19 DIAGNOSIS — Z7982 Long term (current) use of aspirin: Secondary | ICD-10-CM | POA: Diagnosis not present

## 2019-10-19 DIAGNOSIS — I10 Essential (primary) hypertension: Secondary | ICD-10-CM | POA: Insufficient documentation

## 2019-10-19 DIAGNOSIS — M5441 Lumbago with sciatica, right side: Secondary | ICD-10-CM | POA: Insufficient documentation

## 2019-10-19 DIAGNOSIS — M159 Polyosteoarthritis, unspecified: Secondary | ICD-10-CM | POA: Diagnosis not present

## 2019-10-19 DIAGNOSIS — Z880 Allergy status to penicillin: Secondary | ICD-10-CM | POA: Insufficient documentation

## 2019-10-19 DIAGNOSIS — M1990 Unspecified osteoarthritis, unspecified site: Secondary | ICD-10-CM | POA: Diagnosis not present

## 2019-10-19 DIAGNOSIS — M545 Low back pain: Secondary | ICD-10-CM | POA: Diagnosis present

## 2019-10-19 MED ORDER — METHOCARBAMOL 500 MG PO TABS: 500.0000 mg | ORAL_TABLET | Freq: Three times a day (TID) | ORAL | 0 refills | Status: DC | PRN

## 2019-10-19 MED ORDER — MELOXICAM 15 MG PO TABS: 15.0000 mg | ORAL_TABLET | Freq: Every day | ORAL | 0 refills | Status: DC

## 2019-10-19 NOTE — Progress Notes (Signed)
Patient ID: Anna Mcguire, female   DOB: October 01, 1955, 64 y.o.   MRN: [id number]   Anna Mcguire, is a 64 y.o. female  JJK:[id number]  [id number]  DOB - [birth year]  Subjective:  Chief Complaint and HPI: Anna Mcguire is a 64 y.o. female here today for lower back pain that is radiating into both posterior thighs.  Her legs got tangled up in a chair about 1 month ago.  Since then she has been feeling stiff and achey esp when she goes to stand up or walk from sitting.  No weakness.  Moving bowels and bladder normally.  usu takes meloxicam but is out of it.     ROS:   Constitutional:  No f/c, No night sweats, No unexplained weight loss. EENT:  No vision changes, No blurry vision, No hearing changes. No mouth, throat, or ear problems.  Respiratory: No cough, No SOB Cardiac: No CP, no palpitations GI:  No abd pain, No N/V/D. GU: No Urinary s/sx Musculoskeletal: see above Neuro: No headache, no dizziness, no motor weakness.  Skin: No rash Endocrine:  No polydipsia. No polyuria.  Psych: Denies SI/HI  No problems updated.  ALLERGIES: Allergies  Allergen Reactions  . Penicillins     PAST MEDICAL HISTORY: Past Medical History:  Diagnosis Date  . Acid reflux   . Arthritis   . Hypertension     MEDICATIONS AT HOME: Prior to Admission medications   Medication Sig Start Date End Date Taking? Authorizing Provider  albuterol (VENTOLIN HFA) 108 (90 Base) MCG/ACT inhaler Inhale 2 puffs into the lungs every 6 (six) hours as needed for wheezing or shortness of breath. 07/29/19  Yes Fulp, Cammie, MD  amLODipine (NORVASC) 10 MG tablet Take 1 tablet (10 mg total) by mouth daily. To lower blood pressure 06/24/19  Yes Fulp, Cammie, MD  aspirin EC 81 MG tablet Take 1 tablet (81 mg total) by mouth daily. 05/12/19  Yes Wieters, Hallie C, Anna Mcguire  atorvastatin (LIPITOR) 80 MG tablet Take 1 tablet (80 mg total) by mouth daily. 06/24/19  Yes Fulp, Cammie, MD  hydrochlorothiazide (HYDRODIURIL) 25 MG  tablet Take 1 tablet (25 mg total) by mouth daily. 06/24/19  Yes Fulp, Cammie, MD  loratadine (CLARITIN) 10 MG tablet Take 1 tablet (10 mg total) by mouth daily. 06/24/19  Yes Fulp, Cammie, MD  meloxicam (MOBIC) 15 MG tablet Take 1 tablet (15 mg total) by mouth daily. As needed for joint pain.  Take after eating 10/19/19  Yes Argentina Donovan, Anna Mcguire  pantoprazole (PROTONIX) 40 MG tablet Take 1 tablet (40 mg total) by mouth daily. 06/24/19  Yes Fulp, Cammie, MD  thiamine (VITAMIN B-1) 100 MG tablet Take 100 mg by mouth daily.   Yes [provider]  doxycycline (VIBRA-TABS) 100 MG tablet Take 1 tablet (100 mg total) by mouth 2 (two) times daily. Patient not taking: Reported on 10/19/2019 07/29/19   Fulp, Ander Gaster, MD  methocarbamol (ROBAXIN) 500 MG tablet Take 1 tablet (500 mg total) by mouth every 8 (eight) hours as needed for muscle spasms. 10/19/19   Argentina Donovan, Anna Mcguire  fluticasone (FLONASE) 50 MCG/ACT nasal spray Place 1 spray into both nostrils daily.  05/12/19  [provider]  lisinopril-hydrochlorothiazide (PRINZIDE,ZESTORETIC) 20-12.5 MG per tablet Take 1 tablet by mouth daily.  05/12/19  [provider]  lovastatin (MEVACOR) 20 MG tablet Take 20 mg by mouth daily at 6 PM.  05/12/19  [provider]     Objective:  EXAM:   Vitals:  10/19/19 1525 10/19/19 1526  BP:  (!) 147/79  Pulse:  98  Temp:  98.7 F (37.1 C)  TempSrc:  Oral  SpO2:  100%  Weight: 125 lb (56.7 kg) 125 lb (56.7 kg)  Height:  5' 6.5" (1.689 m)    General appearance : A&OX3. NAD. Non-toxic-appearing HEENT: Atraumatic and Normocephalic.  PERRLA. EOM intact.   Chest/Lungs:  Breathing-non-labored, fair air entry bilaterally, breath sounds normal without rales, +rhonchi, no wheezing  CVS: S1 S2 regular, no murmurs, gallops, rubs  Back full S&ROM for age.  +paraspinus spasm B lower back.  No spiny TTP.  DTR=intact B.  Neg SLR B.   Extremities: Bilateral Lower Ext shows no edema,  both legs are warm to touch with = pulse throughout Neurology:  CN II-XII grossly intact, Non focal.   Psych:  TP linear. J/I WNL. Normal speech. Appropriate eye contact and affect.  Skin:  No Rash  Data Review Lab Results  Component Value Date   HGBA1C 5.2 07/29/2019     Assessment & Plan   1. Osteoarthritis of multiple joints, unspecified osteoarthritis type - meloxicam (MOBIC) 15 MG tablet; Take 1 tablet (15 mg total) by mouth daily. As needed for joint pain.  Take after eating  Dispense: 90 tablet; Refill: 0  2. Acute bilateral low back pain with bilateral sciatica No red flags - DG Lumbar Spine Complete; Future - methocarbamol (ROBAXIN) 500 MG tablet; Take 1 tablet (500 mg total) by mouth every 8 (eight) hours as needed for muscle spasms.  Dispense: 60 tablet; Refill: 0     Patient have been counseled extensively about nutrition and exercise  Return in about 3 months (around 01/18/2020) for PCP;  chronic conditions.  The patient was given clear instructions to go to ER or return to medical center if symptoms don't improve, worsen or new problems develop. The patient verbalized understanding. The patient was told to call to get lab results if they haven't heard anything in the next week.     Georgian Mcguire, Anna Mcguire [facility] [place], [place] [phone]   10/19/2019, 3:54 PM

## 2019-10-19 NOTE — Progress Notes (Signed)
Both leg pain when she gets up in the morning and at night

## 2019-10-24 ENCOUNTER — Emergency Department (HOSPITAL_COMMUNITY)
Admission: EM | Admit: 2019-10-24 | Discharge: 2019-10-24 | Disposition: A | Discharge status: Home / Self Care | Payer: Medicare Other | Attending: Emergency Medicine | Admitting: Emergency Medicine

## 2019-10-24 ENCOUNTER — Other Ambulatory Visit: Payer: Self-pay

## 2019-10-24 ENCOUNTER — Encounter (HOSPITAL_COMMUNITY): Payer: Self-pay | Admitting: Emergency Medicine

## 2019-10-24 DIAGNOSIS — Z5321 Procedure and treatment not carried out due to patient leaving prior to being seen by health care provider: Secondary | ICD-10-CM | POA: Diagnosis not present

## 2019-10-24 DIAGNOSIS — M545 Low back pain: Secondary | ICD-10-CM | POA: Diagnosis not present

## 2019-10-24 NOTE — ED Triage Notes (Signed)
Pt states one month she had a fall at home and since has been having lower back pain worse with ambulation. Pt denies any numbness or tingling in legs. Pt was seen at PCP today and sent here to ER for xrays.

## 2019-10-24 NOTE — ED Notes (Signed)
Pt left waiting room

## 2019-10-26 ENCOUNTER — Emergency Department (HOSPITAL_COMMUNITY): Payer: Medicare Other

## 2019-10-26 ENCOUNTER — Other Ambulatory Visit: Payer: Self-pay

## 2019-10-26 ENCOUNTER — Encounter (HOSPITAL_COMMUNITY): Payer: Self-pay | Admitting: Emergency Medicine

## 2019-10-26 ENCOUNTER — Emergency Department (HOSPITAL_COMMUNITY)
Admission: EM | Admit: 2019-10-26 | Discharge: 2019-10-26 | Disposition: A | Discharge status: Home / Self Care | Payer: Medicare Other | Attending: Emergency Medicine | Admitting: Emergency Medicine

## 2019-10-26 DIAGNOSIS — I10 Essential (primary) hypertension: Secondary | ICD-10-CM | POA: Insufficient documentation

## 2019-10-26 DIAGNOSIS — M545 Low back pain, unspecified: Secondary | ICD-10-CM

## 2019-10-26 DIAGNOSIS — Z7982 Long term (current) use of aspirin: Secondary | ICD-10-CM | POA: Diagnosis not present

## 2019-10-26 DIAGNOSIS — F17200 Nicotine dependence, unspecified, uncomplicated: Secondary | ICD-10-CM | POA: Diagnosis not present

## 2019-10-26 DIAGNOSIS — F1210 Cannabis abuse, uncomplicated: Secondary | ICD-10-CM | POA: Diagnosis not present

## 2019-10-26 DIAGNOSIS — Z79899 Other long term (current) drug therapy: Secondary | ICD-10-CM | POA: Insufficient documentation

## 2019-10-26 MED ORDER — DICLOFENAC SODIUM 1 % EX GEL: 2.0000 g | Freq: Four times a day (QID) | CUTANEOUS | 0 refills | Status: AC

## 2019-10-26 NOTE — ED Provider Notes (Signed)
[facility] EMERGENCY DEPARTMENT Provider Note   CSN: [id number] Arrival date & time: 10/26/19  4166     History Chief Complaint  Patient presents with  . Back Pain    Anna Mcguire is a 64 y.o. female with past medical history sniffing for acid reflux, arthritis, hypertension presents to emergency department today with chief complaint of progressively worsening back pain x1 month.  Patient states the pain is in her low back and radiates down posterior thighs.  She states this pain started after she fell out of a chair.  She states she got her feet tangled up in the legs of the chair causing her to fall on her back.  She denies hitting her head or passing out.  She describes the pain as a dull aching sensation.  She rates the pain 6 out of 10 in severity.  She saw PCP on 10/19/2019 and it was recommended she have x-rays of her lumbar spine and she was prescribed Robaxin.  She states she has been taking the Robaxin and it is helping the pain.  She also takes meloxicam for her osteoarthritis.  No additional medications for symptoms prior to arrival. Denies fevers, weight loss, numbness/weakness of upper and lower extremities, bowel/bladder incontinence, urinary retention, history of cancer, saddle anesthesia, history of back surgery, history of IVDA.  History provided by patient with additional history obtained from chart review.      Past Medical History:  Diagnosis Date  . Acid reflux   . Arthritis   . Hypertension     Patient Active Problem List   Diagnosis Date Noted  . Smoking addiction 10/06/2019  . Primary osteoarthritis of left knee 09/08/2019  . Pain in right shoulder 08/11/2019    Past Surgical History:  Procedure Laterality Date  . COLONOSCOPY WITH PROPOFOL N/A 06/07/2014   Procedure: COLONOSCOPY WITH PROPOFOL;  Surgeon: Willis Modena, MD;  Location: [facility] ENDOSCOPY;  Service: Endoscopy;  Laterality: N/A;  . ESOPHAGOGASTRODUODENOSCOPY (EGD) WITH  PROPOFOL N/A 06/07/2014   Procedure: ESOPHAGOGASTRODUODENOSCOPY (EGD) WITH PROPOFOL;  Surgeon: Willis Modena, MD;  Location: [facility];  Service: Endoscopy;  Laterality: N/A;     OB History   No obstetric history on file.     Family History  Problem Relation Age of Onset  . Kidney failure Mother   . Aneurysm Father     Social History   Tobacco Use  . Smoking status: Current Every Day Smoker  . Smokeless tobacco: Never Used  Substance Use Topics  . Alcohol use: Yes    Comment: occ  . Drug use: Yes    Types: Marijuana    Home Medications Prior to Admission medications   Medication Sig Start Date End Date Taking? Authorizing Provider  albuterol (VENTOLIN HFA) 108 (90 Base) MCG/ACT inhaler Inhale 2 puffs into the lungs every 6 (six) hours as needed for wheezing or shortness of breath. 07/29/19   Fulp, Cammie, MD  amLODipine (NORVASC) 10 MG tablet Take 1 tablet (10 mg total) by mouth daily. To lower blood pressure 06/24/19   Fulp, Cammie, MD  aspirin EC 81 MG tablet Take 1 tablet (81 mg total) by mouth daily. 05/12/19   Wieters, Hallie C, PA-C  atorvastatin (LIPITOR) 80 MG tablet Take 1 tablet (80 mg total) by mouth daily. 06/24/19   Fulp, Cammie, MD  diclofenac Sodium (VOLTAREN) 1 % GEL Apply 2 g topically 4 (four) times daily for 6 days. 10/26/19 11/01/19  Tamila Gaulin E, PA-C  doxycycline (VIBRA-TABS) 100  MG tablet Take 1 tablet (100 mg total) by mouth 2 (two) times daily. Patient not taking: Reported on 10/19/2019 07/29/19   Fulp, Ander Gaster, MD  hydrochlorothiazide (HYDRODIURIL) 25 MG tablet Take 1 tablet (25 mg total) by mouth daily. 06/24/19   Fulp, Cammie, MD  loratadine (CLARITIN) 10 MG tablet Take 1 tablet (10 mg total) by mouth daily. 06/24/19   Fulp, Cammie, MD  meloxicam (MOBIC) 15 MG tablet Take 1 tablet (15 mg total) by mouth daily. As needed for joint pain.  Take after eating 10/19/19   Argentina Donovan, PA-C  methocarbamol (ROBAXIN) 500 MG tablet Take 1 tablet (500  mg total) by mouth every 8 (eight) hours as needed for muscle spasms. 10/19/19   Argentina Donovan, PA-C  pantoprazole (PROTONIX) 40 MG tablet Take 1 tablet (40 mg total) by mouth daily. 06/24/19   Fulp, Cammie, MD  thiamine (VITAMIN B-1) 100 MG tablet Take 100 mg by mouth daily.    [provider]  fluticasone (FLONASE) 50 MCG/ACT nasal spray Place 1 spray into both nostrils daily.  05/12/19  [provider]  lisinopril-hydrochlorothiazide (PRINZIDE,ZESTORETIC) 20-12.5 MG per tablet Take 1 tablet by mouth daily.  05/12/19  [provider]  lovastatin (MEVACOR) 20 MG tablet Take 20 mg by mouth daily at 6 PM.  05/12/19  [provider]    Allergies    Penicillins  Review of Systems   Review of Systems  All other systems are reviewed and are negative for acute change except as noted in the HPI.   Physical Exam Updated Vital Signs BP 126/81 (BP Location: Left Arm)   Pulse 96   Temp 98.9 F (37.2 C) (Oral)   Resp 17   Ht 5\' 6"  (1.676 m)   Wt 56.7 kg   SpO2 100%   BMI 20.18 kg/m   Physical Exam Vitals and nursing note reviewed.  Constitutional:      Appearance: She is well-developed. She is not ill-appearing or toxic-appearing.  HENT:     Head: Normocephalic and atraumatic.     Nose: Nose normal.  Eyes:     General: No scleral icterus.       Right eye: No discharge.        Left eye: No discharge.     Conjunctiva/sclera: Conjunctivae normal.  Neck:     Vascular: No JVD.  Cardiovascular:     Rate and Rhythm: Normal rate and regular rhythm.     Pulses: Normal pulses.          Dorsalis pedis pulses are 2+ on the right side and 2+ on the left side.     Heart sounds: Normal heart sounds.  Pulmonary:     Effort: Pulmonary effort is normal.     Breath sounds: Normal breath sounds.  Abdominal:     General: There is no distension.     Palpations: There is no mass.     Hernia: No hernia is present.  Musculoskeletal:        General: Normal range  of motion.     Cervical back: Normal range of motion.       Back:     Comments: Tenderness to palpation as depicted in image above.  Full range of motion of the T-spine and L-spine No tenderness to palpation of the spinous processes of the T-spine or L-spine No crepitus, deformity or step-offs Tenderness to palpation of bilateral paraspinous muscles of the L-spine. Negative straight leg raise test bilaterally.  Skin:  General: Skin is warm and dry.  Neurological:     Mental Status: She is oriented to person, place, and time.     GCS: GCS eye subscore is 4. GCS verbal subscore is 5. GCS motor subscore is 6.     Comments: Fluent speech, no facial droop.  Sensation grossly intact to light touch in the lower extremities bilaterally. No saddle anesthesias. Strength 5/5 with flexion and extension at the bilateral hips, knees, and ankles. No noted gait deficit. Ambulates with cane. Coordination intact with heel to shin testing.   Psychiatric:        Behavior: Behavior normal.       ED Results / Procedures / Treatments   Labs (all labs ordered are listed, but only abnormal results are displayed) Labs Reviewed - No data to display  EKG None  Radiology DG Lumbar Spine Complete  Result Date: 10/26/2019 CLINICAL DATA:  Low back pain after fall 2 months ago. EXAM: LUMBAR SPINE - COMPLETE 4+ VIEW COMPARISON:  None. FINDINGS: No fracture is noted. Minimal grade 1 retrolisthesis is noted at L2-3 secondary to severe degenerative disc disease at this level. Moderate degenerative disc disease is noted at L3-4 and L4-5 with anterior osteophyte formation. IMPRESSION: Multilevel degenerative disc disease. No acute abnormality seen in the lumbar spine. Electronically Signed   By: Lupita Raider M.D.   On: 10/26/2019 08:56    Procedures Procedures (including critical care time)  Medications Ordered in ED Medications - No data to display  ED Course  I have reviewed the triage vital signs  and the nursing notes.  Pertinent labs & imaging results that were available during my care of the patient were reviewed by me and considered in my medical decision making (see chart for details).    MDM Rules/Calculators/A&P                      Patient with back pain.  No neurological deficits and normal neuro exam.  Patient can walk but states is painful.  No loss of bowel or bladder control.  No concern for cauda equina.  No fever, night sweats, weight loss, h/o cancer, IVDU. Xray of lumbar spine viewed by me without fracture or dislocation. Degenerative changes seen. Pain has improved with Robaxin prescribed by PCP.  Will send prescription to pharmacy for Voltaren gel. The patient appears reasonably screened and/or stabilized for discharge and I doubt any other medical condition or other [facility] requiring further screening, evaluation, or treatment in the ED at this time prior to discharge. The patient is safe for discharge with strict return precautions discussed. Recommend pcp follow up. Findings and plan of care discussed with supervising physician Dr. Jacqulyn Bath.    Portions of this note were generated with Scientist, clinical (histocompatibility and immunogenetics). Dictation errors may occur despite best attempts at proofreading.  Final Clinical Impression(s) / ED Diagnoses Final diagnoses:  Acute bilateral low back pain, unspecified whether sciatica present    Rx / DC Orders ED Discharge Orders         Ordered    diclofenac Sodium (VOLTAREN) 1 % GEL  4 times daily     10/26/19 0919           Sherene Sires, PA-C 10/26/19 1006    Long, Arlyss Repress, MD 10/26/19 1958

## 2019-10-26 NOTE — ED Notes (Signed)
Patient went to restroom immediately after checking in and prior to being called back for triage.

## 2019-10-26 NOTE — Discharge Instructions (Addendum)
You have been seen today for back pain. Please read and follow all provided instructions. Return to the emergency room for worsening condition or new concerning symptoms.    Your xray does not show any broken bones or fractures. It shows some degenerative changes which is common changes as we get older.  1. Medications:  Prescription sent to your pharmacy for voltaren gel. This is used to help with musculoskeletal pain. You can use it for the next 6 days.  Continue usual home medications Take medications as prescribed. Please review all of the medicines and only take them if you do not have an allergy to them.   2. Treatment: rest, drink plenty of fluids  3. Follow Up:  Please follow up with primary care provider by scheduling an appointment as soon as possible for a visit     It is also a possibility that you have an allergic reaction to any of the medicines that you have been prescribed - Everybody reacts differently to medications and while MOST people have no trouble with most medicines, you may have a reaction such as nausea, vomiting, rash, swelling, shortness of breath. If this is the case, please stop taking the medicine immediately and contact your physician.  ?

## 2019-10-26 NOTE — ED Triage Notes (Signed)
Pt reports she had a fall a few days ago and is now  Having what she describes as "muscle spasms" in her R back that radiate down her leg. Was here a few days ago for the same but LWBS due to wait time. She states her pcp wanted her to come in for xrays. Ambulatory with cane.

## 2019-11-08 ENCOUNTER — Other Ambulatory Visit: Payer: Self-pay

## 2019-11-08 ENCOUNTER — Ambulatory Visit (INDEPENDENT_AMBULATORY_CARE_PROVIDER_SITE_OTHER): Payer: Medicare Other | Admitting: Podiatry

## 2019-11-08 ENCOUNTER — Ambulatory Visit (INDEPENDENT_AMBULATORY_CARE_PROVIDER_SITE_OTHER): Payer: Medicare Other | Admitting: Orthopaedic Surgery

## 2019-11-08 ENCOUNTER — Encounter: Payer: Self-pay | Admitting: Orthopaedic Surgery

## 2019-11-08 ENCOUNTER — Encounter: Payer: Self-pay | Admitting: Podiatry

## 2019-11-08 VITALS — Temp 97.0°F

## 2019-11-08 DIAGNOSIS — B351 Tinea unguium: Secondary | ICD-10-CM

## 2019-11-08 DIAGNOSIS — M79674 Pain in right toe(s): Secondary | ICD-10-CM

## 2019-11-08 DIAGNOSIS — M1712 Unilateral primary osteoarthritis, left knee: Secondary | ICD-10-CM | POA: Diagnosis not present

## 2019-11-08 DIAGNOSIS — M79675 Pain in left toe(s): Secondary | ICD-10-CM

## 2019-11-08 DIAGNOSIS — F17200 Nicotine dependence, unspecified, uncomplicated: Secondary | ICD-10-CM

## 2019-11-08 MED ORDER — METHYLPREDNISOLONE ACETATE 40 MG/ML IJ SUSP
80.0000 mg | INTRAMUSCULAR | Status: AC | PRN
  Administered 2019-11-08: 13:00:00 80 mg via INTRA_ARTICULAR

## 2019-11-08 MED ORDER — BUPIVACAINE HCL 0.5 % IJ SOLN
2.0000 mL | INTRAMUSCULAR | Status: AC | PRN
  Administered 2019-11-08: 2 mL via INTRA_ARTICULAR

## 2019-11-08 MED ORDER — LIDOCAINE HCL 1 % IJ SOLN
2.0000 mL | INTRAMUSCULAR | Status: AC | PRN
Start: 2019-11-08 — End: 2019-11-08
  Administered 2019-11-08: 2 mL

## 2019-11-08 NOTE — Progress Notes (Signed)
Office Visit Note   Patient: Anna Mcguire           Date of Birth: September 28, 1955           MRN: [id number] Visit Date: 11/08/2019              Requested by: [name], MD [address] [place],  [place] [address] PCP: [name], MD   Assessment & Plan: Visit Diagnoses:  1. Primary osteoarthritis of left knee     Plan: Mrs. Glassburn has end-stage osteoarthritis of her left knee.  She has significant valgus with weightbearing.  I have ordered a brace from [employer] but it is not available as yet.  We have had a long discussion regarding definitive treatment of her knee i.e. total knee replacement.  I would not consider that until she stop smoking.  She already has started a program through her primary care physician's office.  Today will reinject her left knee with cortisone and monitor her response.  I would like her to wait at least 20months from the time she stop smoking before we consider surgery.  All questions were answered  Follow-Up Instructions: Return if symptoms worsen or fail to improve.   Orders:  No orders of the defined types were placed in this encounter.  No orders of the defined types were placed in this encounter.     Procedures: Large Joint Inj: L knee on 11/08/2019 1:15 PM Indications: pain and diagnostic evaluation Details: 25 G 1.5 in needle, anteromedial approach  Arthrogram: No  Medications: 2 mL lidocaine 1 %; 2 mL bupivacaine 0.5 %; 80 mg methylPREDNISolone acetate 40 MG/ML Procedure, treatment alternatives, risks and benefits explained, specific risks discussed. Consent was given by the patient. Patient was prepped and draped in the usual sterile fashion.       Clinical Data: No additional findings.   Subjective: Chief Complaint  Patient presents with  . Left Knee - Follow-up  Patient presents today for follow up on her left knee. She had a cortisone injection on 09/08/2019. She states that the injection helped for one month. She  takes Meloxicam for pain. She states that her knee is always painful, and swells.  Presently involved in a smoking cessation program.  Has a brace ordered from [employer] for her left knee valgus  HPI  Review of Systems   Objective: Vital Signs: There were no vitals taken for this visit.  Physical Exam Constitutional:      Appearance: She is well-developed.  Eyes:     Pupils: Pupils are equal, round, and reactive to light.  Pulmonary:     Effort: Pulmonary effort is normal.  Skin:    General: Skin is warm and dry.  Neurological:     Mental Status: She is alert and oriented to person, place, and time.  Psychiatric:        Behavior: Behavior normal.     Ortho Exam awake alert and oriented x3.  Comfortable sitting.  Significant valgus with weightbearing left knee of at least 20 degrees.  Does have some opening medially with a valgus stress.  Very small effusion.  Mostly lateral joint pain.  Full extension and flexion over 105 degrees.  No popliteal pain.  No distal edema.  No calf pain.  Specialty Comments:  No specialty comments available.  Imaging: No results found.   PMFS History: Patient Active Problem List   Diagnosis Date Noted  . Smoking addiction 10/06/2019  . Primary osteoarthritis of left knee  09/08/2019  . Pain in right shoulder 08/11/2019   Past Medical History:  Diagnosis Date  . Acid reflux   . Arthritis   . Hypertension     Family History  Problem Relation Age of Onset  . Kidney failure Mother   . Aneurysm Father     Past Surgical History:  Procedure Laterality Date  . COLONOSCOPY WITH PROPOFOL N/A 06/07/2014   Procedure: COLONOSCOPY WITH PROPOFOL;  Surgeon: Arta Silence, MD;  Location: [facility];  Service: Endoscopy;  Laterality: N/A;  . ESOPHAGOGASTRODUODENOSCOPY (EGD) WITH PROPOFOL N/A 06/07/2014   Procedure: ESOPHAGOGASTRODUODENOSCOPY (EGD) WITH PROPOFOL;  Surgeon: Arta Silence, MD;  Location: [facility];  Service: Endoscopy;  Laterality:  N/A;   Social History   Occupational History  . Not on file  Tobacco Use  . Smoking status: Current Every Day Smoker  . Smokeless tobacco: Never Used  Substance and Sexual Activity  . Alcohol use: Yes    Comment: occ  . Drug use: Yes    Types: Marijuana  . Sexual activity: Not Currently

## 2019-11-08 NOTE — Patient Instructions (Signed)
Onychomycosis/Fungal Toenails  WHAT IS IT? An infection that lies within the keratin of your nail plate that is caused by a fungus.  WHY ME? Fungal infections affect all ages, sexes, races, and creeds.  There may be many factors that predispose you to a fungal infection such as age, coexisting medical conditions such as diabetes, or an autoimmune disease; stress, medications, fatigue, genetics, etc.  Bottom line: fungus thrives in a warm, moist environment and your shoes offer such a location.  IS IT CONTAGIOUS? Theoretically, yes.  You do not want to share shoes, nail clippers or files with someone who has fungal toenails.  Walking around barefoot in the same room or sleeping in the same bed is unlikely to transfer the organism.  It is important to realize, however, that fungus can spread easily from one nail to the next on the same foot.  HOW DO WE TREAT THIS?  There are several ways to treat this condition.  Treatment may depend on many factors such as age, medications, pregnancy, liver and kidney conditions, etc.  It is best to ask your doctor which options are available to you.  1. No treatment.   Unlike many other medical concerns, you can live with this condition.  However for many people this can be a painful condition and may lead to ingrown toenails or a bacterial infection.  It is recommended that you keep the nails cut short to help reduce the amount of fungal nail. 2. Topical treatment.  These range from herbal remedies to prescription strength nail lacquers.  About 40-50% effective, topicals require twice daily application for approximately 9 to 12 months or until an entirely new nail has grown out.  The most effective topicals are medical grade medications available through physicians offices. 3. Oral antifungal medications.  With an 80-90% cure rate, the most common oral medication requires 3 to 4 months of therapy and stays in your system for a year as the new nail grows out.  Oral  antifungal medications do require blood work to make sure it is a safe drug for you.  A liver function panel will be performed prior to starting the medication and after the first month of treatment.  It is important to have the blood work performed to avoid any harmful side effects.  In general, this medication safe but blood work is required. 4. Laser Therapy.  This treatment is performed by applying a specialized laser to the affected nail plate.  This therapy is noninvasive, fast, and non-painful.  It is not covered by insurance and is therefore, out of pocket.  The results have been very good with a 80-95% cure rate.  [facility] is the only practice in the area to offer this therapy. 5. Permanent Nail Avulsion.  Removing the entire nail so that a new nail will not grow back. Hammer Toe  Hammer toe is a change in the shape (a deformity) of your toe. The deformity causes the middle joint of your toe to stay bent. This causes pain, especially when you are wearing shoes. Hammer toe starts gradually. At first, the toe can be straightened. Gradually over time, the deformity becomes stiff and permanent. Early treatments to keep the toe straight may relieve pain. As the deformity becomes stiff and permanent, surgery may be needed to straighten the toe. What are the causes? Hammer toe is caused by abnormal bending of the toe joint that is closest to your foot. It happens gradually over time. This pulls  on the muscles and connections (tendons) of the toe joint, making them weak and stiff. It is often related to wearing shoes that are too short or narrow and do not let your toes straighten. What increases the risk? You may be at greater risk for hammer toe if you:  Are female.  Are older.  Wear shoes that are too small.  Wear high-heeled shoes that pinch your toes.  Are a Advertising account planner.  Have a second toe that is longer than your big toe (first toe).  Injure your foot or toe.  Have  arthritis.  Have a family history of hammer toe.  Have a nerve or muscle disorder. What are the signs or symptoms? The main symptoms of this condition are pain and deformity of the toe. The pain is worse when wearing shoes, walking, or running. Other symptoms may include:  Corns or calluses over the bent part of the toe or between the toes.  Redness and a burning feeling on the toe.  An open sore that forms on the top of the toe.  Not being able to straighten the toe. How is this diagnosed? This condition is diagnosed based on your symptoms and a physical exam. During the exam, your health care provider will try to straighten your toe to see how stiff the deformity is. You may also have tests, such as:  A blood test to check for rheumatoid arthritis.  An X-ray to show how severe the deformity is. How is this treated? Treatment for this condition will depend on how stiff the deformity is. Surgery is often needed. However, sometimes a hammer toe can be straightened without surgery. Treatments that do not involve surgery include:  Taping the toe into a straightened position.  Using pads and cushions to protect the toe (orthotics).  Wearing shoes that provide enough room for the toes.  Doing toe-stretching exercises at home.  Taking an NSAID to reduce pain and swelling. If these treatments do not help or the toe cannot be straightened, surgery is the next option. The most common surgeries used to straighten a hammer toe include:  Arthroplasty. In this procedure, part of the joint is removed, and that allows the toe to straighten.  Fusion. In this procedure, cartilage between the two bones of the joint is taken out and the bones are fused together into one longer bone.  Implantation. In this procedure, part of the bone is removed and replaced with an implant to let the toe move again.  Flexor tendon transfer. In this procedure, the tendons that curl the toes down (flexor tendons)  are repositioned. Follow these instructions at home:  Take over-the-counter and prescription medicines only as told by your health care provider.  Do toe straightening and stretching exercises as told by your health care provider.  Keep all follow-up visits as told by your health care provider. This is important. How is this prevented?  Wear shoes that give your toes enough room and do not cause pain.  Do not wear high-heeled shoes. Contact a health care provider if:  Your pain gets worse.  Your toe becomes red or swollen.  You develop an open sore on your toe. This information is not intended to replace advice given to you by your health care provider. Make sure you discuss any questions you have with your health care provider. Document Revised: 06/05/2017 Document Reviewed: 10/17/2015 Elsevier Patient Education  2020 [employer].

## 2019-11-10 NOTE — Progress Notes (Signed)
Subjective: Anna Mcguire presents today for follow up of painful mycotic nails b/l that are difficult to trim. Pain interferes with ambulation. Aggravating factors include wearing enclosed shoe gear. Pain is relieved with periodic professional debridement.   She voices no new pedal concerns on today's visit.  Allergies  Allergen Reactions  . Penicillins      Objective: Vitals:   11/08/19 0848  Temp: (!) 97 F (36.1 C)    Pt 64 y.o. year old AA female  in NAD. AAO x 3.   Vascular Examination:  Capillary refill time to digits immediate b/l. Palpable DP pulses b/l. Palpable PT pulses b/l. Pedal hair sparse b/l. Skin temperature gradient within normal limits b/l. No edema noted b/l.  Dermatological Examination: Pedal skin with normal turgor, texture and tone bilaterally. No open wounds bilaterally. No interdigital macerations bilaterally. Toenails 1-5 b/l elongated, dystrophic, thickened, crumbly with subungual debris and tenderness to dorsal palpation.  Musculoskeletal: Normal muscle strength 5/5 to all lower extremity muscle groups bilaterally. No pain crepitus or joint limitation noted with ROM b/l. Hallux valgus with bunion deformity noted b/l. Hammertoes noted to the 2-5 bilaterally.  Neurological: Protective sensation intact 5/5 intact bilaterally with 10g monofilament b/l. Vibratory sensation intact b/l. Proprioception intact bilaterally.  Assessment: 1. Pain due to onychomycosis of toenails of both feet    Plan: -No new findings. No new orders. -Toenails 1-5 b/l were debrided in length and girth with sterile nail nippers and dremel without iatrogenic bleeding.  -Patient to continue soft, supportive shoe gear daily. -Patient to report any pedal injuries to medical professional immediately. -Patient/POA to call should there be question/concern in the interim.  Return in about 3 months (around 02/08/2020) for nail and callus trim.  Freddie Breech, DPM

## 2019-11-16 ENCOUNTER — Encounter: Payer: Self-pay | Admitting: Family Medicine

## 2019-11-16 ENCOUNTER — Other Ambulatory Visit: Payer: Self-pay

## 2019-11-16 ENCOUNTER — Ambulatory Visit: Payer: Medicare Other | Attending: Family Medicine | Admitting: Family Medicine

## 2019-11-16 VITALS — BP 153/81 | HR 108 | Temp 98.1°F | Ht 66.0 in | Wt 122.8 lb

## 2019-11-16 DIAGNOSIS — Z791 Long term (current) use of non-steroidal anti-inflammatories (NSAID): Secondary | ICD-10-CM | POA: Insufficient documentation

## 2019-11-16 DIAGNOSIS — F17210 Nicotine dependence, cigarettes, uncomplicated: Secondary | ICD-10-CM | POA: Insufficient documentation

## 2019-11-16 DIAGNOSIS — F17200 Nicotine dependence, unspecified, uncomplicated: Secondary | ICD-10-CM | POA: Diagnosis not present

## 2019-11-16 DIAGNOSIS — Z7901 Long term (current) use of anticoagulants: Secondary | ICD-10-CM | POA: Insufficient documentation

## 2019-11-16 DIAGNOSIS — R0602 Shortness of breath: Secondary | ICD-10-CM | POA: Insufficient documentation

## 2019-11-16 DIAGNOSIS — J029 Acute pharyngitis, unspecified: Secondary | ICD-10-CM

## 2019-11-16 DIAGNOSIS — Z79899 Other long term (current) drug therapy: Secondary | ICD-10-CM | POA: Insufficient documentation

## 2019-11-16 DIAGNOSIS — I10 Essential (primary) hypertension: Secondary | ICD-10-CM | POA: Diagnosis not present

## 2019-11-16 DIAGNOSIS — R05 Cough: Secondary | ICD-10-CM | POA: Insufficient documentation

## 2019-11-16 DIAGNOSIS — K219 Gastro-esophageal reflux disease without esophagitis: Secondary | ICD-10-CM | POA: Insufficient documentation

## 2019-11-16 DIAGNOSIS — Z7982 Long term (current) use of aspirin: Secondary | ICD-10-CM | POA: Diagnosis not present

## 2019-11-16 DIAGNOSIS — J441 Chronic obstructive pulmonary disease with (acute) exacerbation: Secondary | ICD-10-CM | POA: Diagnosis not present

## 2019-11-16 MED ORDER — PREDNISONE 20 MG PO TABS: ORAL_TABLET | ORAL | 0 refills | Status: DC

## 2019-11-16 MED ORDER — AZITHROMYCIN 250 MG PO TABS: ORAL_TABLET | ORAL | 0 refills | Status: DC

## 2019-11-16 MED ORDER — ALBUTEROL SULFATE HFA 108 (90 BASE) MCG/ACT IN AERS: 2.0000 | INHALATION_SPRAY | Freq: Four times a day (QID) | RESPIRATORY_TRACT | 2 refills | Status: DC | PRN

## 2019-11-16 NOTE — Progress Notes (Signed)
Established Patient Office Visit  Subjective:  Patient ID: Anna Mcguire, female    DOB: 1955-07-26  Age: 64 y.o. MRN: [id number]  CC: SOB, cough and sore throat-Naiah Donahoe, MD  HPI [facility] presents due to worsening shortness of breath with cough that is productive of phlegm that was initially clear but has changed to white, biege or yellow. She also has had some increased nasal congestion, drainage down her throat and sore throat which is worse with cough. She has some sensation of chest tightness/wheezing. She continues to smoke. She denies any fever or chills. She does feel fatigued. Symptoms have worsened over the past week and 1/2.  Past Medical History:  Diagnosis Date  . Acid reflux   . Arthritis   . Hypertension     Past Surgical History:  Procedure Laterality Date  . COLONOSCOPY WITH PROPOFOL N/A 06/07/2014   Procedure: COLONOSCOPY WITH PROPOFOL;  Surgeon: Willis Modena, MD;  Location: [facility] ENDOSCOPY;  Service: Endoscopy;  Laterality: N/A;  . ESOPHAGOGASTRODUODENOSCOPY (EGD) WITH PROPOFOL N/A 06/07/2014   Procedure: ESOPHAGOGASTRODUODENOSCOPY (EGD) WITH PROPOFOL;  Surgeon: Willis Modena, MD;  Location: [facility] ENDOSCOPY;  Service: Endoscopy;  Laterality: N/A;    Family History  Problem Relation Age of Onset  . Kidney failure Mother   . Aneurysm Father     Social History   Socioeconomic History  . Marital status: Single    Spouse name: Not on file  . Number of children: Not on file  . Years of education: Not on file  . Highest education level: Not on file  Occupational History  . Not on file  Tobacco Use  . Smoking status: Current Every Day Smoker  . Smokeless tobacco: Never Used  Substance and Sexual Activity  . Alcohol use: Yes    Comment: occ  . Drug use: Yes    Types: Marijuana  . Sexual activity: Not Currently  Other Topics Concern  . Not on file  Social History Narrative  . Not on file   Social Determinants of Health    Financial Resource Strain:   . Difficulty of Paying Living Expenses:   Food Insecurity:   . Worried About Programme researcher, broadcasting/film/video in the Last Year:   . Barista in the Last Year:   Transportation Needs:   . Freight forwarder (Medical):   Marland Kitchen Lack of Transportation (Non-Medical):   Physical Activity:   . Days of Exercise per Week:   . Minutes of Exercise per Session:   Stress:   . Feeling of Stress :   Social Connections:   . Frequency of Communication with Friends and Family:   . Frequency of Social Gatherings with Friends and Family:   . Attends Religious Services:   . Active Member of Clubs or Organizations:   . Attends Banker Meetings:   Marland Kitchen Marital Status:   Intimate Partner Violence:   . Fear of Current or Ex-Partner:   . Emotionally Abused:   Marland Kitchen Physically Abused:   . Sexually Abused:     Outpatient Medications Prior to Visit  Medication Sig Dispense Refill  . amLODipine (NORVASC) 10 MG tablet Take 1 tablet (10 mg total) by mouth daily. To lower blood pressure 90 tablet 1  . aspirin EC 81 MG tablet Take 1 tablet (81 mg total) by mouth daily. 60 tablet 0  . atorvastatin (LIPITOR) 80 MG tablet Take 1 tablet (80 mg total) by mouth daily. 90 tablet 1  . cyclobenzaprine (  FLEXERIL) 10 MG tablet Take 10 mg by mouth at bedtime.    . hydrochlorothiazide (HYDRODIURIL) 25 MG tablet Take 1 tablet (25 mg total) by mouth daily. 90 tablet 1  . loratadine (CLARITIN) 10 MG tablet Take 1 tablet (10 mg total) by mouth daily. 90 tablet 1  . pantoprazole (PROTONIX) 40 MG tablet Take 1 tablet (40 mg total) by mouth daily. 90 tablet 1  . thiamine (VITAMIN B-1) 100 MG tablet Take 100 mg by mouth daily.    Marland Kitchen albuterol (VENTOLIN HFA) 108 (90 Base) MCG/ACT inhaler Inhale 2 puffs into the lungs every 6 (six) hours as needed for wheezing or shortness of breath. 18 g 2  . meloxicam (MOBIC) 15 MG tablet Take 1 tablet (15 mg total) by mouth daily. As needed for joint pain.  Take  after eating 90 tablet 0  . methocarbamol (ROBAXIN) 500 MG tablet Take 1 tablet (500 mg total) by mouth every 8 (eight) hours as needed for muscle spasms. 60 tablet 0  . doxycycline (VIBRA-TABS) 100 MG tablet Take 1 tablet (100 mg total) by mouth 2 (two) times daily. (Patient not taking: Reported on 11/16/2019) 20 tablet 0   No facility-administered medications prior to visit.    Allergies  Allergen Reactions  . Penicillins     ROS Review of Systems  Constitutional: Positive for fatigue. Negative for chills and fever.  HENT: Positive for congestion, postnasal drip and sore throat. Negative for ear pain, rhinorrhea and trouble swallowing.   Respiratory: Positive for cough, chest tightness, shortness of breath and wheezing.   Gastrointestinal: Negative for abdominal pain, constipation, diarrhea and nausea.  Endocrine: Negative for polydipsia, polyphagia and polyuria.  Genitourinary: Negative for dysuria and frequency.  Skin: Negative for rash and wound.  Neurological: Positive for light-headedness (with recurrent cough). Negative for dizziness.  Hematological: Negative for adenopathy. Does not bruise/bleed easily.      Objective:    Physical Exam  Constitutional: She appears well-developed and well-nourished.  HENT:  Nose: Mucosal edema and rhinorrhea present. No sinus tenderness. Right sinus exhibits no maxillary sinus tenderness and no frontal sinus tenderness. Left sinus exhibits no maxillary sinus tenderness and no frontal sinus tenderness.  Mouth/Throat: Posterior oropharyngeal edema and posterior oropharyngeal erythema present. No oropharyngeal exudate.  Cardiovascular: Normal rate and regular rhythm.  Pulmonary/Chest: Effort normal. She has wheezes.  Mild decrease in breath sounds and mild expiratory wheeze; no increase in work of breathing  Musculoskeletal:     Cervical back: Normal range of motion and neck supple.  Lymphadenopathy:    She has no cervical adenopathy.   Nursing note and vitals reviewed.   BP (!) 153/81   Pulse (!) 108   Temp 98.1 F (36.7 C) (Temporal)   Ht 5\' 6"  (1.676 m)   Wt 122 lb 12.8 oz (55.7 kg)   SpO2 100%   BMI 19.82 kg/m  Wt Readings from Last 3 Encounters:  11/16/19 122 lb 12.8 oz (55.7 kg)  10/26/19 125 lb (56.7 kg)  10/24/19 126 lb (57.2 kg)     Health Maintenance Due  Topic Date Due  . Hepatitis C Screening  Never done  . COVID-19 Vaccine (1) Never done  . PAP SMEAR-Modifier  Never done  . DEXA SCAN  Never done     No results found for: TSH Lab Results  Component Value Date   WBC 7.7 07/29/2019   HGB 11.6 07/29/2019   HCT 33.8 (L) 07/29/2019   MCV 94 07/29/2019   PLT 305  07/29/2019   Lab Results  Component Value Date   NA 135 08/12/2019   K 3.7 08/12/2019   CO2 19 (L) 08/12/2019   GLUCOSE 169 (H) 08/12/2019   BUN 15 08/12/2019   CREATININE 1.20 (H) 08/12/2019   BILITOT 0.3 07/29/2019   ALKPHOS 129 (H) 07/29/2019   AST 25 07/29/2019   ALT 19 07/29/2019   PROT 7.1 07/29/2019   ALBUMIN 4.9 (H) 07/29/2019   CALCIUM 10.8 (H) 08/12/2019   ANIONGAP 12 01/31/2014   Lab Results  Component Value Date   CHOL 183 07/06/2008   Lab Results  Component Value Date   HDL 62 07/06/2008   Lab Results  Component Value Date   LDLCALC 84 07/06/2008   Lab Results  Component Value Date   TRIG 186 (H) 07/06/2008   Lab Results  Component Value Date   CHOLHDL 3.0 Ratio 07/06/2008   Lab Results  Component Value Date   HGBA1C 5.2 07/29/2019      Assessment & Plan:  1. COPD with acute exacerbation ([place]) Patient with COPD exacerbation for which she will be treated with prednisone, azithromycin and refill of albuterol inhaler. Smoking cessation discussed and encouraged. Educational information provided regarding COPD. Urgent care or ED follow-up if symptoms worsen. Patient should avoid decongestants which can increase her blood pressure and monitor her blood pressure. - Ambulatory referral to  Pulmonology - albuterol (VENTOLIN HFA) 108 (90 Base) MCG/ACT inhaler; Inhale 2 puffs into the lungs every 6 (six) hours as needed for wheezing or shortness of breath.  Dispense: 18 g; Refill: 2  2. Sore throat May be related to postnasal drainage, viral illness or recurrent cough. No signs of exudate. OTC pain medication, antihistamine use to help with postnasal drainage and she can gargle with warm salt water. Azithromycin given for COPD exacerbation which would cover bacterial infection if present.   3. Tobacco dependence She is not yet ready to stop smoking but  the importance of smoking cessation discussed with patient for more than 5 minutes and she is aware that when she is ready to stop smoking that help is available and she can call to make appointment with the clinical pharmacist to help with smoking cessation. Educational information provided on Steps to Quit Smoking.   An After Visit Summary was printed and given to the patient.   Follow-up: Return for next week if not better; ED if symptoms worsen; scheduled appt or 6 weeks .    Antony Blackbird, MD

## 2019-11-16 NOTE — Patient Instructions (Signed)
Chronic Obstructive Pulmonary Disease Chronic obstructive pulmonary disease (COPD) is a long-term (chronic) lung problem. When you have COPD, it is hard for air to get in and out of your lungs. Usually the condition gets worse over time, and your lungs will never return to normal. There are things you can do to keep yourself as healthy as possible.  Your doctor may treat your condition with: ? Medicines. ? Oxygen. ? Lung surgery.  Your doctor may also recommend: ? Rehabilitation. This includes steps to make your body work better. It may involve a team of specialists. ? Quitting smoking, if you smoke. ? Exercise and changes to your diet. ? Comfort measures (palliative care). Follow these instructions at home: Medicines  Take over-the-counter and prescription medicines only as told by your doctor.  Talk to your doctor before taking any cough or allergy medicines. You may need to avoid medicines that cause your lungs to be dry. Lifestyle  If you smoke, stop. Smoking makes the problem worse. If you need help quitting, ask your doctor.  Avoid being around things that make your breathing worse. This may include smoke, chemicals, and fumes.  Stay active, but remember to rest as well.  Learn and use tips on how to relax.  Make sure you get enough sleep. Most adults need at least 7 hours of sleep every night.  Eat healthy foods. Eat smaller meals more often. Rest before meals. Controlled breathing Learn and use tips on how to control your breathing as told by your doctor. Try:  Breathing in (inhaling) through your nose for 1 second. Then, pucker your lips and breath out (exhale) through your lips for 2 seconds.  Putting one hand on your belly (abdomen). Breathe in slowly through your nose for 1 second. Your hand on your belly should move out. Pucker your lips and breathe out slowly through your lips. Your hand on your belly should move in as you breathe out.  Controlled coughing Learn  and use controlled coughing to clear mucus from your lungs. Follow these steps: 1. Lean your head a little forward. 2. Breathe in deeply. 3. Try to hold your breath for 3 seconds. 4. Keep your mouth slightly open while coughing 2 times. 5. Spit any mucus out into a tissue. 6. Rest and do the steps again 1 or 2 times as needed. General instructions  Make sure you get all the shots (vaccines) that your doctor recommends. Ask your doctor about a flu shot and a pneumonia shot.  Use oxygen therapy and pulmonary rehabilitation if told by your doctor. If you need home oxygen therapy, ask your doctor if you should buy a tool to measure your oxygen level (oximeter).  Make a COPD action plan with your doctor. This helps you to know what to do if you feel worse than usual.  Manage any other conditions you have as told by your doctor.  Avoid going outside when it is very hot, cold, or humid.  Avoid people who have a sickness you can catch (contagious).  Keep all follow-up visits as told by your doctor. This is important. Contact a doctor if:  You cough up more mucus than usual.  There is a change in the color or thickness of the mucus.  It is harder to breathe than usual.  Your breathing is faster than usual.  You have trouble sleeping.  You need to use your medicines more often than usual.  You have trouble doing your normal activities such as getting dressed   or walking around the house. Get help right away if:  You have shortness of breath while resting.  You have shortness of breath that stops you from: ? Being able to talk. ? Doing normal activities.  Your chest hurts for longer than 5 minutes.  Your skin color is more blue than usual.  Your pulse oximeter shows that you have low oxygen for longer than 5 minutes.  You have a fever.  You feel too tired to breathe normally. Summary  Chronic obstructive pulmonary disease (COPD) is a long-term lung problem.  The way your  lungs work will never return to normal. Usually the condition gets worse over time. There are things you can do to keep yourself as healthy as possible.  Take over-the-counter and prescription medicines only as told by your doctor.  If you smoke, stop. Smoking makes the problem worse. This information is not intended to replace advice given to you by your health care provider. Make sure you discuss any questions you have with your health care provider. Document Revised: 06/05/2017 Document Reviewed: 07/28/2016 Elsevier Patient Education  2020 [employer]  Steps to Quit Smoking Smoking tobacco is the leading cause of preventable death. It can affect almost every organ in the body. Smoking puts you and people around you at risk for many serious, long-lasting (chronic) diseases. Quitting smoking can be hard, but it is one of the best things that you can do for your health. It is never too late to quit. How do I get ready to quit? When you decide to quit smoking, make a plan to help you succeed. Before you quit:  Pick a date to quit. Set a date within the next 2 weeks to give you time to prepare.  Write down the reasons why you are quitting. Keep this list in places where you will see it often.  Tell your family, friends, and co-workers that you are quitting. Their support is important.  Talk with your doctor about the choices that may help you quit.  Find out if your health insurance will pay for these treatments.  Know the people, places, things, and activities that make you want to smoke (triggers). Avoid them. What first steps can I take to quit smoking?  Throw away all cigarettes at home, at work, and in your car.  Throw away the things that you use when you smoke, such as ashtrays and lighters.  Clean your car. Make sure to empty the ashtray.  Clean your home, including curtains and carpets. What can I do to help me quit smoking? Talk with your doctor about taking medicines  and seeing a counselor at the same time. You are more likely to succeed when you do both.  If you are pregnant or breastfeeding, talk with your doctor about counseling or other ways to quit smoking. Do not take medicine to help you quit smoking unless your doctor tells you to do so. To quit smoking: Quit right away  Quit smoking totally, instead of slowly cutting back on how much you smoke over a period of time.  Go to counseling. You are more likely to quit if you go to counseling sessions regularly. Take medicine You may take medicines to help you quit. Some medicines need a prescription, and some you can buy over-the-counter. Some medicines may contain a drug called nicotine to replace the nicotine in cigarettes. Medicines may:  Help you to stop having the desire to smoke (cravings).  Help to stop the problems that   come when you stop smoking (withdrawal symptoms). Your doctor may ask you to use:  Nicotine patches, gum, or lozenges.  Nicotine inhalers or sprays.  Non-nicotine medicine that is taken by mouth. Find resources Find resources and other ways to help you quit smoking and remain smoke-free after you quit. These resources are most helpful when you use them often. They include:  Online chats with a counselor.  Phone quitlines.  Printed self-help materials.  Support groups or group counseling.  Text messaging programs.  Mobile phone apps. Use apps on your mobile phone or tablet that can help you stick to your quit plan. There are many free apps for mobile phones and tablets as well as websites. Examples include Quit Guide from the CDC and smokefree.gov  What things can I do to make it easier to quit?   Talk to your family and friends. Ask them to support and encourage you.  Call a phone quitline (1-800-QUIT-NOW), reach out to support groups, or work with a counselor.  Ask people who smoke to not smoke around you.  Avoid places that make you want to smoke, such  as: ? Bars. ? Parties. ? Smoke-break areas at work.  Spend time with people who do not smoke.  Lower the stress in your life. Stress can make you want to smoke. Try these things to help your stress: ? Getting regular exercise. ? Doing deep-breathing exercises. ? Doing yoga. ? Meditating. ? Doing a body scan. To do this, close your eyes, focus on one area of your body at a time from head to toe. Notice which parts of your body are tense. Try to relax the muscles in those areas. How will I feel when I quit smoking? Day 1 to 3 weeks Within the first 24 hours, you may start to have some problems that come from quitting tobacco. These problems are very bad 2-3 days after you quit, but they do not often last for more than 2-3 weeks. You may get these symptoms:  Mood swings.  Feeling restless, nervous, angry, or annoyed.  Trouble concentrating.  Dizziness.  Strong desire for high-sugar foods and nicotine.  Weight gain.  Trouble pooping (constipation).  Feeling like you may vomit (nausea).  Coughing or a sore throat.  Changes in how the medicines that you take for other issues work in your body.  Depression.  Trouble sleeping (insomnia). Week 3 and afterward After the first 2-3 weeks of quitting, you may start to notice more positive results, such as:  Better sense of smell and taste.  Less coughing and sore throat.  Slower heart rate.  Lower blood pressure.  Clearer skin.  Better breathing.  Fewer sick days. Quitting smoking can be hard. Do not give up if you fail the first time. Some people need to try a few times before they succeed. Do your best to stick to your quit plan, and talk with your doctor if you have any questions or concerns. Summary  Smoking tobacco is the leading cause of preventable death. Quitting smoking can be hard, but it is one of the best things that you can do for your health.  When you decide to quit smoking, make a plan to help you  succeed.  Quit smoking right away, not slowly over a period of time.  When you start quitting, seek help from your doctor, family, or friends. This information is not intended to replace advice given to you by your health care provider. Make sure you discuss any   questions you have with your health care provider. Document Revised: 03/18/2019 Document Reviewed: 09/11/2018 Elsevier Patient Education  2020 Elsevier Inc.  

## 2019-11-16 NOTE — Progress Notes (Signed)
122.8LB  Trouble breathing and she use an inhaler  Need refill for her inhalers and protonix

## 2019-11-23 ENCOUNTER — Ambulatory Visit (HOSPITAL_COMMUNITY)
Admission: RE | Admit: 2019-11-23 | Discharge: 2019-11-23 | Disposition: A | Discharge status: Home / Self Care | Payer: Medicare Other | Source: Ambulatory Visit | Attending: Family Medicine | Admitting: Family Medicine

## 2019-11-23 ENCOUNTER — Other Ambulatory Visit: Payer: Self-pay

## 2019-11-23 DIAGNOSIS — J441 Chronic obstructive pulmonary disease with (acute) exacerbation: Secondary | ICD-10-CM | POA: Insufficient documentation

## 2019-11-25 ENCOUNTER — Telehealth: Payer: Self-pay | Admitting: Family Medicine

## 2019-11-25 NOTE — Telephone Encounter (Signed)
Patient returned call regarding x-ray results. Patient also states that the following medications that were prescribed to her are not helping   azithromycin (ZITHROMAX) 250 MG tablet predniSONE (DELTASONE) 20 MG tablet  Please f/u

## 2019-11-27 NOTE — Telephone Encounter (Signed)
Patient may need to go to urgent care/emergency department for further evaluation or schedule office visit

## 2019-11-28 ENCOUNTER — Telehealth: Payer: Self-pay | Admitting: Family Medicine

## 2019-11-28 NOTE — Telephone Encounter (Signed)
Pt called to inform nurse she has successfully completed her imaging orders. Please follow up with results when available

## 2019-11-29 ENCOUNTER — Ambulatory Visit (HOSPITAL_COMMUNITY)
Admission: EM | Admit: 2019-11-29 | Discharge: 2019-11-29 | Disposition: A | Discharge status: Home / Self Care | Payer: Medicare Other | Attending: Family Medicine | Admitting: Family Medicine

## 2019-11-29 ENCOUNTER — Encounter (HOSPITAL_COMMUNITY): Payer: Self-pay

## 2019-11-29 ENCOUNTER — Other Ambulatory Visit: Payer: Self-pay

## 2019-11-29 DIAGNOSIS — K029 Dental caries, unspecified: Secondary | ICD-10-CM | POA: Diagnosis not present

## 2019-11-29 DIAGNOSIS — K047 Periapical abscess without sinus: Secondary | ICD-10-CM

## 2019-11-29 MED ORDER — CLINDAMYCIN HCL 150 MG PO CAPS
150.0000 mg | ORAL_CAPSULE | Freq: Three times a day (TID) | ORAL | 0 refills | Status: DC
Start: 2019-11-29 — End: 2020-01-13

## 2019-11-29 MED ORDER — METRONIDAZOLE 500 MG PO TABS
500.0000 mg | ORAL_TABLET | Freq: Two times a day (BID) | ORAL | 0 refills | Status: DC
Start: 2019-11-29 — End: 2020-01-13

## 2019-11-29 MED ORDER — TRAMADOL-ACETAMINOPHEN 37.5-325 MG PO TABS: 1.0000 | ORAL_TABLET | Freq: Four times a day (QID) | ORAL | 0 refills | Status: DC | PRN

## 2019-11-29 NOTE — ED Provider Notes (Signed)
[facility]    CSN: [id number] Arrival date & time: 11/29/19  1135      History   Chief Complaint Chief Complaint  Patient presents with  . Dental Pain  . Facial Swelling    HPI Anna Mcguire is a 63 y.o. female.   HPI \ Patient has known poor dentition.  No caries.  She is here for dental infection for 2 days.  She states that the left side of her face is swollen.  She states is very painful.  She does not have a dentist.  I will give her a Writer handout. Patient states she is allergic to penicillins.  She states they make her itch all over.  She has never had an anaphylactic reaction or difficulty breathing with any antibiotics  Past Medical History:  Diagnosis Date  . Acid reflux   . Arthritis   . Hypertension     Patient Active Problem List   Diagnosis Date Noted  . Smoking addiction 10/06/2019  . Primary osteoarthritis of left knee 09/08/2019  . Pain in right shoulder 08/11/2019    Past Surgical History:  Procedure Laterality Date  . COLONOSCOPY WITH PROPOFOL N/A 06/07/2014   Procedure: COLONOSCOPY WITH PROPOFOL;  Surgeon: Willis Modena, MD;  Location: [facility];  Service: Endoscopy;  Laterality: N/A;  . ESOPHAGOGASTRODUODENOSCOPY (EGD) WITH PROPOFOL N/A 06/07/2014   Procedure: ESOPHAGOGASTRODUODENOSCOPY (EGD) WITH PROPOFOL;  Surgeon: Willis Modena, MD;  Location: [facility];  Service: Endoscopy;  Laterality: N/A;    OB History   No obstetric history on file.      Home Medications    Prior to Admission medications   Medication Sig Start Date End Date Taking? Authorizing Provider  albuterol (VENTOLIN HFA) 108 (90 Base) MCG/ACT inhaler Inhale 2 puffs into the lungs every 6 (six) hours as needed for wheezing or shortness of breath. 11/16/19   Fulp, Cammie, MD  amLODipine (NORVASC) 10 MG tablet Take 1 tablet (10 mg total) by mouth daily. To lower blood pressure 06/24/19   Fulp, Cammie, MD  aspirin EC 81 MG tablet Take 1  tablet (81 mg total) by mouth daily. 05/12/19   Wieters, Hallie C, PA-C  atorvastatin (LIPITOR) 80 MG tablet Take 1 tablet (80 mg total) by mouth daily. 06/24/19   Fulp, Cammie, MD  clindamycin (CLEOCIN) 150 MG capsule Take 1 capsule (150 mg total) by mouth 3 (three) times daily. 11/29/19   Eustace Moore, MD  cyclobenzaprine (FLEXERIL) 10 MG tablet Take 10 mg by mouth at bedtime. 10/19/19   [provider]  hydrochlorothiazide (HYDRODIURIL) 25 MG tablet Take 1 tablet (25 mg total) by mouth daily. 06/24/19   Fulp, Cammie, MD  loratadine (CLARITIN) 10 MG tablet Take 1 tablet (10 mg total) by mouth daily. 06/24/19   Fulp, Cammie, MD  metroNIDAZOLE (FLAGYL) 500 MG tablet Take 1 tablet (500 mg total) by mouth 2 (two) times daily. 11/29/19   Eustace Moore, MD  pantoprazole (PROTONIX) 40 MG tablet Take 1 tablet (40 mg total) by mouth daily. 06/24/19   Fulp, Cammie, MD  thiamine (VITAMIN B-1) 100 MG tablet Take 100 mg by mouth daily.    [provider]  traMADol-acetaminophen (ULTRACET) 37.5-325 MG tablet Take 1-2 tablets by mouth every 6 (six) hours as needed. 11/29/19   Eustace Moore, MD  fluticasone [facility]) 50 MCG/ACT nasal spray Place 1 spray into both nostrils daily.  05/12/19  [provider]  lisinopril-hydrochlorothiazide (PRINZIDE,ZESTORETIC) 20-12.5 MG per tablet Take 1  tablet by mouth daily.  05/12/19  [provider]  lovastatin (MEVACOR) 20 MG tablet Take 20 mg by mouth daily at 6 PM.  05/12/19  [provider]    Family History Family History  Problem Relation Age of Onset  . Kidney failure Mother   . Aneurysm Father     Social History Social History   Tobacco Use  . Smoking status: Current Every Day Smoker  . Smokeless tobacco: Never Used  Substance Use Topics  . Alcohol use: Yes    Comment: occ  . Drug use: Yes    Types: Marijuana     Allergies   Penicillins   Review of Systems Review of Systems  HENT: Positive  for dental problem.      Physical Exam Triage Vital Signs ED Triage Vitals  Enc Vitals Group     BP 11/29/19 1227 (!) 157/88     Pulse Rate 11/29/19 1227 97     Resp 11/29/19 1227 17     Temp 11/29/19 1227 99.2 F (37.3 C)     Temp Source 11/29/19 1227 Oral     SpO2 11/29/19 1227 98 %     Weight --      Height --      Head Circumference --      Peak Flow --      Pain Score 11/29/19 1225 10     Pain Loc --      Pain Edu? --      Excl. in GC? --    No data found.  Updated Vital Signs BP (!) 157/88 (BP Location: Right Arm)   Pulse 97   Temp 99.2 F (37.3 C) (Oral)   Resp 17   SpO2 98%      Physical Exam Constitutional:      General: She is not in acute distress.    Appearance: She is well-developed.  HENT:     Head: Normocephalic and atraumatic.      Mouth/Throat:      Comments: Left cheek is swollen just below the zygomatic arch.  Mild erythema the skin.  The molar upper left is fractured as indicated Eyes:     Conjunctiva/sclera: Conjunctivae normal.     Pupils: Pupils are equal, round, and reactive to light.  Cardiovascular:     Rate and Rhythm: Normal rate.  Pulmonary:     Effort: Pulmonary effort is normal. No respiratory distress.  Abdominal:     General: There is no distension.     Palpations: Abdomen is soft.  Musculoskeletal:        General: Normal range of motion.     Cervical back: Normal range of motion.  Skin:    General: Skin is warm and dry.  Neurological:     Mental Status: She is alert.      UC Treatments / Results  Labs (all labs ordered are listed, but only abnormal results are displayed) Labs Reviewed - No data to display  EKG   Radiology No results found.  Procedures Procedures (including critical care time)  Medications Ordered in UC Medications - No data to display  Initial Impression / Assessment and Plan / UC Course  I have reviewed the triage vital signs and the nursing notes.  Pertinent labs & imaging  results that were available during my care of the patient were reviewed by me and considered in my medical decision making (see chart for details).      Final Clinical Impressions(s) / UC  Diagnoses   Final diagnoses:  Dental caries  Dental infection     Discharge Instructions     Each take two antibiotics for the infection in your face Take clindamycin 1 pill three times a day with food Take metronidazole 1 pill two times a day with food Avoid alcohol while on the antibiotics Drink plenty of water and liquids Take pain medication as needed Follow-up with a dentist   ED Prescriptions    Medication Sig Dispense Auth. Provider   metroNIDAZOLE (FLAGYL) 500 MG tablet Take 1 tablet (500 mg total) by mouth 2 (two) times daily. 14 tablet Raylene Everts, MD   clindamycin (CLEOCIN) 150 MG capsule Take 1 capsule (150 mg total) by mouth 3 (three) times daily. 21 capsule Raylene Everts, MD   traMADol-acetaminophen (ULTRACET) 37.5-325 MG tablet Take 1-2 tablets by mouth every 6 (six) hours as needed. 20 tablet Raylene Everts, MD     I have reviewed the PDMP during this encounter.   Raylene Everts, MD 11/29/19 [phone]

## 2019-11-29 NOTE — Discharge Instructions (Addendum)
Each take two antibiotics for the infection in your face Take clindamycin 1 pill three times a day with food Take metronidazole 1 pill two times a day with food Avoid alcohol while on the antibiotics Drink plenty of water and liquids Take pain medication as needed Follow-up with a dentist

## 2019-11-29 NOTE — ED Triage Notes (Signed)
Pt presents with left side dental pain and facial swelling X 3 days.

## 2019-11-30 NOTE — Telephone Encounter (Signed)
Orders still show test is  pending

## 2019-12-06 NOTE — Telephone Encounter (Signed)
Late entry- patient went to UC on 11/29/2019

## 2019-12-27 ENCOUNTER — Other Ambulatory Visit: Payer: Self-pay

## 2019-12-27 ENCOUNTER — Ambulatory Visit: Payer: Medicare Other | Attending: Nurse Practitioner | Admitting: Nurse Practitioner

## 2019-12-28 ENCOUNTER — Ambulatory Visit: Payer: Medicare Other | Admitting: Family Medicine

## 2019-12-29 ENCOUNTER — Other Ambulatory Visit: Payer: Self-pay | Admitting: Physician Assistant

## 2019-12-29 DIAGNOSIS — M159 Polyosteoarthritis, unspecified: Secondary | ICD-10-CM

## 2020-01-12 ENCOUNTER — Other Ambulatory Visit: Payer: Self-pay

## 2020-01-12 ENCOUNTER — Emergency Department (HOSPITAL_COMMUNITY): Payer: Medicare Other

## 2020-01-12 ENCOUNTER — Encounter (HOSPITAL_COMMUNITY): Payer: Self-pay | Admitting: Emergency Medicine

## 2020-01-12 ENCOUNTER — Inpatient Hospital Stay (HOSPITAL_COMMUNITY)
Admission: EM | Admit: 2020-01-12 | Discharge: 2020-01-17 | DRG: 641 | Disposition: A | Discharge status: Home / Self Care | Payer: Medicare Other | Attending: Internal Medicine | Admitting: Internal Medicine

## 2020-01-12 DIAGNOSIS — J449 Chronic obstructive pulmonary disease, unspecified: Secondary | ICD-10-CM | POA: Diagnosis present

## 2020-01-12 DIAGNOSIS — E871 Hypo-osmolality and hyponatremia: Secondary | ICD-10-CM | POA: Diagnosis not present

## 2020-01-12 DIAGNOSIS — K529 Noninfective gastroenteritis and colitis, unspecified: Secondary | ICD-10-CM | POA: Diagnosis present

## 2020-01-12 DIAGNOSIS — E785 Hyperlipidemia, unspecified: Secondary | ICD-10-CM | POA: Diagnosis present

## 2020-01-12 DIAGNOSIS — F1090 Alcohol use, unspecified, uncomplicated: Secondary | ICD-10-CM

## 2020-01-12 DIAGNOSIS — Z841 Family history of disorders of kidney and ureter: Secondary | ICD-10-CM

## 2020-01-12 DIAGNOSIS — J441 Chronic obstructive pulmonary disease with (acute) exacerbation: Secondary | ICD-10-CM | POA: Diagnosis present

## 2020-01-12 DIAGNOSIS — Z720 Tobacco use: Secondary | ICD-10-CM | POA: Diagnosis present

## 2020-01-12 DIAGNOSIS — Z20822 Contact with and (suspected) exposure to covid-19: Secondary | ICD-10-CM | POA: Diagnosis present

## 2020-01-12 DIAGNOSIS — B9620 Unspecified Escherichia coli [E. coli] as the cause of diseases classified elsewhere: Secondary | ICD-10-CM | POA: Diagnosis present

## 2020-01-12 DIAGNOSIS — J029 Acute pharyngitis, unspecified: Secondary | ICD-10-CM

## 2020-01-12 DIAGNOSIS — E876 Hypokalemia: Secondary | ICD-10-CM | POA: Diagnosis present

## 2020-01-12 DIAGNOSIS — F17210 Nicotine dependence, cigarettes, uncomplicated: Secondary | ICD-10-CM | POA: Diagnosis present

## 2020-01-12 DIAGNOSIS — I10 Essential (primary) hypertension: Secondary | ICD-10-CM | POA: Diagnosis present

## 2020-01-12 DIAGNOSIS — Z789 Other specified health status: Secondary | ICD-10-CM

## 2020-01-12 DIAGNOSIS — Z79899 Other long term (current) drug therapy: Secondary | ICD-10-CM

## 2020-01-12 DIAGNOSIS — F1010 Alcohol abuse, uncomplicated: Secondary | ICD-10-CM | POA: Diagnosis present

## 2020-01-12 DIAGNOSIS — Z7982 Long term (current) use of aspirin: Secondary | ICD-10-CM

## 2020-01-12 DIAGNOSIS — K219 Gastro-esophageal reflux disease without esophagitis: Secondary | ICD-10-CM | POA: Diagnosis present

## 2020-01-12 LAB — CBC WITH DIFFERENTIAL/PLATELET
Abs Immature Granulocytes: 0.01 10*3/uL (ref 0.00–0.07)
Basophils Absolute: 0.1 10*3/uL (ref 0.0–0.1)
Basophils Relative: 2 %
Eosinophils Absolute: 0.1 10*3/uL (ref 0.0–0.5)
Eosinophils Relative: 3 %
HCT: 28 % — ABNORMAL LOW (ref 36.0–46.0)
Hemoglobin: 10.3 g/dL — ABNORMAL LOW (ref 12.0–15.0)
Immature Granulocytes: 0 %
Lymphocytes Relative: 37 %
Lymphs Abs: 1 10*3/uL (ref 0.7–4.0)
MCH: 32.8 pg (ref 26.0–34.0)
MCHC: 36.8 g/dL — ABNORMAL HIGH (ref 30.0–36.0)
MCV: 89.2 fL (ref 80.0–100.0)
Monocytes Absolute: 0.4 10*3/uL (ref 0.1–1.0)
Monocytes Relative: 14 %
Neutro Abs: 1.2 10*3/uL — ABNORMAL LOW (ref 1.7–7.7)
Neutrophils Relative %: 44 %
Platelets: 274 10*3/uL (ref 150–400)
RBC: 3.14 MIL/uL — ABNORMAL LOW (ref 3.87–5.11)
RDW: 11.9 % (ref 11.5–15.5)
WBC: 2.7 10*3/uL — ABNORMAL LOW (ref 4.0–10.5)
nRBC: 0 % (ref 0.0–0.2)

## 2020-01-12 LAB — GROUP A STREP BY PCR: Group A Strep by PCR: NOT DETECTED

## 2020-01-12 LAB — URINALYSIS, ROUTINE W REFLEX MICROSCOPIC
Bilirubin Urine: NEGATIVE
Glucose, UA: NEGATIVE mg/dL
Hgb urine dipstick: NEGATIVE
Ketones, ur: NEGATIVE mg/dL
Leukocytes,Ua: NEGATIVE
Nitrite: NEGATIVE
Protein, ur: NEGATIVE mg/dL
Specific Gravity, Urine: 1.005 (ref 1.005–1.030)
pH: 6 (ref 5.0–8.0)

## 2020-01-12 LAB — BASIC METABOLIC PANEL
Anion gap: 14 (ref 5–15)
BUN: 9 mg/dL (ref 8–23)
CO2: 24 mmol/L (ref 22–32)
Calcium: 9.6 mg/dL (ref 8.9–10.3)
Chloride: 86 mmol/L — ABNORMAL LOW (ref 98–111)
Creatinine, Ser: 0.96 mg/dL (ref 0.44–1.00)
GFR calc Af Amer: >60 mL/min (ref >60)
GFR calc non Af Amer: >60 mL/min (ref >60)
Glucose, Bld: 79 mg/dL (ref 70–99)
Potassium: 2.7 mmol/L — CL (ref 3.5–5.1)
Sodium: 124 mmol/L — ABNORMAL LOW (ref 135–145)

## 2020-01-12 LAB — POC SARS CORONAVIRUS 2 AG -  ED: SARS Coronavirus 2 Ag: NEGATIVE

## 2020-01-12 LAB — MAGNESIUM: Magnesium: 1.8 mg/dL (ref 1.7–2.4)

## 2020-01-12 LAB — TROPONIN I (HIGH SENSITIVITY): Troponin I (High Sensitivity): 7 ng/L (ref <18)

## 2020-01-12 MED ORDER — POTASSIUM CHLORIDE 10 MEQ/100ML IV SOLN
10.0000 meq | INTRAVENOUS | Status: AC
  Administered 2020-01-12 (×2): 10 meq via INTRAVENOUS
  Filled 2020-01-12 (×2): qty 100

## 2020-01-12 MED ORDER — IPRATROPIUM-ALBUTEROL 0.5-2.5 (3) MG/3ML IN SOLN
3.0000 mL | Freq: Once | RESPIRATORY_TRACT | Status: AC
  Administered 2020-01-12: 3 mL via RESPIRATORY_TRACT
  Filled 2020-01-12: qty 3

## 2020-01-12 MED ORDER — SODIUM CHLORIDE 0.9 % IV BOLUS
1000.0000 mL | Freq: Once | INTRAVENOUS | Status: AC
  Administered 2020-01-12: 1000 mL via INTRAVENOUS

## 2020-01-12 MED ORDER — ALBUTEROL SULFATE HFA 108 (90 BASE) MCG/ACT IN AERS: 2.0000 | INHALATION_SPRAY | Freq: Once | RESPIRATORY_TRACT | Status: DC

## 2020-01-12 MED ORDER — PREDNISONE 50 MG PO TABS
50.0000 mg | ORAL_TABLET | Freq: Every day | ORAL | 0 refills | Status: DC
Start: 2020-01-12 — End: 2020-01-17

## 2020-01-12 MED ORDER — METHYLPREDNISOLONE SODIUM SUCC 125 MG IJ SOLR
125.0000 mg | Freq: Once | INTRAMUSCULAR | Status: AC
  Administered 2020-01-12: 125 mg via INTRAVENOUS
  Filled 2020-01-12: qty 2

## 2020-01-12 MED ORDER — IPRATROPIUM BROMIDE HFA 17 MCG/ACT IN AERS: 2.0000 | INHALATION_SPRAY | RESPIRATORY_TRACT | 12 refills | Status: DC | PRN

## 2020-01-12 MED ORDER — MAGNESIUM SULFATE 2 GM/50ML IV SOLN
2.0000 g | Freq: Once | INTRAVENOUS | Status: AC
  Administered 2020-01-12: 2 g via INTRAVENOUS
  Filled 2020-01-12: qty 50

## 2020-01-12 MED ORDER — POTASSIUM CHLORIDE CRYS ER 20 MEQ PO TBCR
40.0000 meq | EXTENDED_RELEASE_TABLET | Freq: Once | ORAL | Status: AC
  Administered 2020-01-12: 40 meq via ORAL
  Filled 2020-01-12: qty 2

## 2020-01-12 NOTE — ED Notes (Signed)
When resting PT pulse was at 101 but when ambulating pt pulse elevated to 141 then stabilized at about 125

## 2020-01-12 NOTE — ED Triage Notes (Signed)
Pt c/o generalized body aches from a fall 2 weeks ago and sore throat.

## 2020-01-12 NOTE — ED Provider Notes (Signed)
  Face-to-face evaluation   History: She presents for evaluation of sore throat with rhinorrhea and postnasal drip.  She continues to smoke.  She is coughing and producing only white sputum.  She denies chest pain, weakness or dizziness.  Physical exam: Elderly appearing patient, who is uncomfortable.  No respiratory distress.  Lungs with decreased airflow bilaterally with few scattered rhonchi.  No increased work of breathing.  Clinical Course as of Jan 11 2305  Thu Jan 12, 2020  2250 Ambulatory trial, heart rate increased to 130-140, while walking.  After lying back down to return to the high 90s where she had been earlier.   [EW]  2253 Normal  Magnesium [EW]  2253 Normal except white count low, hemoglobin low  CBC with Differential(!) [EW]  2254 Normal  POC SARS Coronavirus 2 Ag-ED - Nasal Swab (BD Veritor Kit) [EW]  2254 Normal except sodium low, potassium low, chloride low  Basic metabolic panel(!!) [EW]  0349 Normal  Urinalysis, Routine w reflex microscopic [EW]  2254 No infiltrate or edema, interpreted by me  DG Chest Portable 1 View [EW]    Clinical Course User Index [EW] Daleen Bo, MD    10:51 PM Reevaluation with update and discussion. After initial assessment and treatment, an updated evaluation reveals patient states that she also feels better with improved breathing, and heart rate in the high 90s.  Findings discussed with her.  She is agreeable to hospitalization.Daleen Bo   MDM-patient presenting with multiple complaints including respiratory postnasal drip.  Chest x-ray does not show heart failure or pneumonia.  Clinical examination is consistent with COPD exacerbation, and she was treated with nebulizer and Solu-Medrol with improvement.  She developed tachycardia with ambulation to 140.  This improved with rest.  Incidental hypokalemia with borderline low magnesium.  These were supplemented.  Patient requires hospitalization for stabilization.  .Critical  Care Performed by: Daleen Bo, MD Authorized by: Daleen Bo, MD   Critical care provider statement:    Critical care time (minutes):  35   Critical care start time:  01/12/2020 6:42 PM   Critical care end time:  01/12/2020 11:12 PM   Critical care time was exclusive of:  Separately billable procedures and treating other patients   Critical care was necessary to treat or prevent imminent or life-threatening deterioration of the following conditions:  Respiratory failure   Critical care was time spent personally by me on the following activities:  Blood draw for specimens, development of treatment plan with patient or surrogate, discussions with consultants, evaluation of patient's response to treatment, examination of patient, obtaining history from patient or surrogate, ordering and performing treatments and interventions, ordering and review of laboratory studies, pulse oximetry, re-evaluation of patient's condition, review of old charts and ordering and review of radiographic studies  11:36 PM-case discussed with hospitalist to admit patient overnight for monitoring and treatment of COPD and hypokalemia.  Medical screening examination/treatment/procedure(s) were conducted as a shared visit with non-physician practitioner(s) and myself.  I personally evaluated the patient during the encounter    Daleen Bo, MD 01/12/20 2336

## 2020-01-12 NOTE — H&P (Signed)
History and Physical    Anna Mcguire MGN:[id number] DOB: May 08, 1956 DOA: 01/12/2020  PCP: [name], MD  Patient coming from: Home  I have personally briefly reviewed patient's old medical records in [facility]  Chief Complaint: Shortness of breath  HPI: Anna Mcguire is a 64 y.o. female with medical history significant for hypertension, hyperlipidemia, suspected COPD, alcohol use, and tobacco use who presents to the ED for evaluation of shortness of breath.  Patient states she has been having 1 to 2 weeks of sore throat, dry cough, and occasional shortness of breath.  She has been treated by her primary care for suspected COPD with antibiotics and bronchodilators.  She also reports 1 month of frequent loose stools with last bowel movement morning of 01/12/2020.  She has not seen any obvious bleeding.  She denies any subjective fevers or diaphoresis but has had some chills.  She denies any chest pain, abdominal pain, dysuria, or peripheral edema.  She is a current smoker, reports smoking 1 pack/day for 20 years.  She reports chronic daily alcohol use consisting of two-three 40 ounce beers per day.  She denies any cocaine or IV drug use.  ED Course:  Initial vitals showed BP 120/72, pulse 84, RR 17, temp 98.3 Fahrenheit, SPO2 98% on room air.  Labs are notable for potassium 2.7, magnesium 1.8, sodium 124, bicarb 24, BUN 9, creatinine 0.96, serum glucose 79, WBC 2.7, hemoglobin 10.3, platelets 274,000, high-sensitivity troponin I 7.  Urinalysis is negative for UTI.  POC SARS-CoV-2 antigen test is negative.  Portable chest x-ray is negative for focal consolidation, edema, or effusion.  Patient was given 2 L normal saline, DuoNeb treatment, IV magnesium 2 g, IV Solu-Medrol 125 mg, IV K 10 mEq x 2 and oral K 40 mEq x 1.  The hospitalist service was consulted to admit for further evaluation and management.  Review of Systems: All systems reviewed and are negative except  as documented in history of present illness above.   Past Medical History:  Diagnosis Date  . Acid reflux   . Arthritis   . Hypertension     Past Surgical History:  Procedure Laterality Date  . COLONOSCOPY WITH PROPOFOL N/A 06/07/2014   Procedure: COLONOSCOPY WITH PROPOFOL;  Surgeon: Willis Modena, MD;  Location: [facility];  Service: Endoscopy;  Laterality: N/A;  . ESOPHAGOGASTRODUODENOSCOPY (EGD) WITH PROPOFOL N/A 06/07/2014   Procedure: ESOPHAGOGASTRODUODENOSCOPY (EGD) WITH PROPOFOL;  Surgeon: Willis Modena, MD;  Location: [facility];  Service: Endoscopy;  Laterality: N/A;    Social History:  reports that she has been smoking. She has never used smokeless tobacco. She reports current alcohol use. She reports current drug use. Drug: Marijuana.  Allergies  Allergen Reactions  . Penicillins Diarrhea    Family History  Problem Relation Age of Onset  . Kidney failure Mother   . Aneurysm Father      Prior to Admission medications   Medication Sig Start Date End Date Taking? Authorizing Provider  albuterol (VENTOLIN HFA) 108 (90 Base) MCG/ACT inhaler Inhale 2 puffs into the lungs every 6 (six) hours as needed for wheezing or shortness of breath. 11/16/19  Yes Fulp, Cammie, MD  amLODipine (NORVASC) 10 MG tablet Take 1 tablet (10 mg total) by mouth daily. To lower blood pressure 06/24/19  Yes Fulp, Cammie, MD  aspirin EC 81 MG tablet Take 1 tablet (81 mg total) by mouth daily. 05/12/19  Yes Wieters, Hallie C, PA-C  atorvastatin (LIPITOR) 80 MG tablet Take 1  tablet (80 mg total) by mouth daily. 06/24/19  Yes Fulp, Cammie, MD  hydrochlorothiazide (HYDRODIURIL) 25 MG tablet Take 1 tablet (25 mg total) by mouth daily. 06/24/19  Yes Fulp, Cammie, MD  loratadine (CLARITIN) 10 MG tablet Take 1 tablet (10 mg total) by mouth daily. 06/24/19  Yes Fulp, Cammie, MD  meloxicam (MOBIC) 15 MG tablet TAKE 1 TABLET(15 MG) BY MOUTH DAILY AFTER A MEAL AS NEEDED FOR JOINT PAIN Patient taking  differently: Take 15 mg by mouth daily.  12/30/19  Yes Fulp, Cammie, MD  pantoprazole (PROTONIX) 40 MG tablet Take 1 tablet (40 mg total) by mouth daily. 06/24/19  Yes Fulp, Cammie, MD  thiamine (VITAMIN B-1) 100 MG tablet Take 100 mg by mouth daily.   Yes [provider]  clindamycin (CLEOCIN) 150 MG capsule Take 1 capsule (150 mg total) by mouth 3 (three) times daily. Patient not taking: Reported on 01/12/2020 11/29/19   Eustace MooreNelson, Yvonne Sue, MD  ipratropium (ATROVENT HFA) 17 MCG/ACT inhaler Inhale 2 puffs into the lungs every 4 (four) hours as needed for wheezing. 01/12/20   Henderly, Britni A, PA-C  metroNIDAZOLE (FLAGYL) 500 MG tablet Take 1 tablet (500 mg total) by mouth 2 (two) times daily. Patient not taking: Reported on 01/12/2020 11/29/19   Eustace MooreNelson, Yvonne Sue, MD  predniSONE (DELTASONE) 50 MG tablet Take 1 tablet (50 mg total) by mouth daily for 4 days. 01/12/20 01/16/20  Henderly, Britni A, PA-C  traMADol-acetaminophen (ULTRACET) 37.5-325 MG tablet Take 1-2 tablets by mouth every 6 (six) hours as needed. Patient not taking: Reported on 01/12/2020 11/29/19   Eustace MooreNelson, Yvonne Sue, MD  fluticasone [facility](FLONASE) 50 MCG/ACT nasal spray Place 1 spray into both nostrils daily.  05/12/19  [provider]  lisinopril-hydrochlorothiazide (PRINZIDE,ZESTORETIC) 20-12.5 MG per tablet Take 1 tablet by mouth daily.  05/12/19  [provider]  lovastatin (MEVACOR) 20 MG tablet Take 20 mg by mouth daily at 6 PM.  05/12/19  [provider]    Physical Exam: Vitals:   01/12/20 1444 01/12/20 1930 01/12/20 2100 01/12/20 2251  BP: 127/71 (!) 148/78 (!) 150/85 133/86  Pulse: 76 98 96 (!) 106  Resp: 16 12 15  (!) 26  Temp:      SpO2: 100% 99% 100% 100%  Weight:      Height:       Constitutional: Resting supine in bed, NAD, calm, comfortable Eyes: PERRL, lids and conjunctivae normal ENMT: Mucous membranes are moist. Posterior pharynx clear of any exudate or lesions.poor dentition.  Neck:  normal, supple, no masses. Respiratory: clear to auscultation bilaterally, no wheezing, no crackles. Normal respiratory effort. No accessory muscle use.  Cardiovascular: Regular rate and rhythm, no murmurs / rubs / gallops. No extremity edema. 2+ pedal pulses. Abdomen: no tenderness, no masses palpated. No hepatosplenomegaly. Bowel sounds positive.  Musculoskeletal: no clubbing / cyanosis. No joint deformity upper and lower extremities. Good ROM, no contractures. Normal muscle tone.  Skin: no rashes, lesions, ulcers. No induration Neurologic: CN 2-12 grossly intact. Sensation intact, Strength 5/5 in all 4.  Psychiatric: Normal judgment and insight. Alert and oriented x 3. Normal mood.     Labs on Admission: I have personally reviewed following labs and imaging studies  CBC: Recent Labs  Lab 01/12/20 1700  WBC 2.7*  NEUTROABS 1.2*  HGB 10.3*  HCT 28.0*  MCV 89.2  PLT 274   Basic Metabolic Panel: Recent Labs  Lab 01/12/20 1700 01/12/20 1844  NA 124*  --   K 2.7*  --  CL 86*  --   CO2 24  --   GLUCOSE 79  --   BUN 9  --   CREATININE 0.96  --   CALCIUM 9.6  --   MG  --  1.8   GFR: Estimated Creatinine Clearance: 53.7 mL/min (by C-G formula based on SCr of 0.96 mg/dL). Liver Function Tests: No results for input(s): AST, ALT, ALKPHOS, BILITOT, PROT, ALBUMIN in the last 168 hours. No results for input(s): LIPASE, AMYLASE in the last 168 hours. No results for input(s): AMMONIA in the last 168 hours. Coagulation Profile: No results for input(s): INR, PROTIME in the last 168 hours. Cardiac Enzymes: No results for input(s): CKTOTAL, CKMB, CKMBINDEX, TROPONINI in the last 168 hours. BNP (last 3 results) No results for input(s): PROBNP in the last 8760 hours. HbA1C: No results for input(s): HGBA1C in the last 72 hours. CBG: No results for input(s): GLUCAP in the last 168 hours. Lipid Profile: No results for input(s): CHOL, HDL, LDLCALC, TRIG, CHOLHDL, LDLDIRECT in the last  72 hours. Thyroid Function Tests: No results for input(s): TSH, T4TOTAL, FREET4, T3FREE, THYROIDAB in the last 72 hours. Anemia Panel: No results for input(s): VITAMINB12, FOLATE, FERRITIN, TIBC, IRON, RETICCTPCT in the last 72 hours. Urine analysis:    Component Value Date/Time   COLORURINE YELLOW 01/12/2020 1650   APPEARANCEUR CLEAR 01/12/2020 1650   LABSPEC 1.005 01/12/2020 1650   PHURINE 6.0 01/12/2020 1650   GLUCOSEU NEGATIVE 01/12/2020 1650   HGBUR NEGATIVE 01/12/2020 1650   BILIRUBINUR NEGATIVE 01/12/2020 1650   KETONESUR NEGATIVE 01/12/2020 1650   PROTEINUR NEGATIVE 01/12/2020 1650   UROBILINOGEN 0.2 01/31/2014 1628   NITRITE NEGATIVE 01/12/2020 1650   LEUKOCYTESUR NEGATIVE 01/12/2020 1650    Radiological Exams on Admission: DG Chest Portable 1 View  Result Date: 01/12/2020 CLINICAL DATA:  64 year old female with shortness of breath. EXAM: PORTABLE CHEST 1 VIEW COMPARISON:  Chest radiograph dated 11/23/2019. FINDINGS: No focal consolidation, pleural effusion, pneumothorax. The cardiac silhouette is within limits. No acute osseous pathology. IMPRESSION: No active disease. Electronically Signed   By: Elgie Collard M.D.   On: 01/12/2020 17:28    EKG: Independently reviewed. Sinus rhythm, T wave inversions in inferior leads.  No prior for comparison.  Assessment/Plan Principal Problem:   Hypokalemia Active Problems:   Tobacco use   Hyponatremia   Hypertension   COPD with acute exacerbation (HCC)   Alcohol use  Faithann Keaundra Stehle is a 64 y.o. female with medical history significant for hypertension, hyperlipidemia, suspected COPD, alcohol use, and tobacco use who is admitted with hypokalemia/hyponatremia.  Hypokalemia: K 2.7 on admission with magnesium 1.8.  Likely multifactorial from GI losses and HCTZ use.  IV repletion initiated in the ED, will give additional IV K supplement and repeat labs in the morning.  Hold HCTZ.  Hyponatremia: Sodium 124 on admission.   Also likely multifactorial from GI losses, HCTZ, and alcohol use. -S/p 2 L normal saline bolus in the ED -Check serum osmolality, urine sodium and osmolality -Repeat labs in the morning -Hold HCTZ  Presumed COPD with acute exacerbation: Treated for presumed COPD exacerbation on ED arrival with significant improvement after receiving IV Solu-Medrol and DuoNeb treatment.  No active wheezing at time of admission and saturating well on room air. -Continue as needed duo nebs and albuterol  Hypertension: Continue home amlodipine.  Hold HCTZ as above.  Hyperlipidemia: Continue atorvastatin.  Tobacco use: Smoking 1 pack/day for last 20 years.  She is advised on smoking cessation.  Nicotine  patch ordered.  Alcohol use: Reports drinking 2-3 forty ounce beers daily.  She is advised on alcohol cessation.  Continue to monitor with CIWA checks.  DVT prophylaxis: Lovenox Code Status: Full code, confirmed with patient Family Communication: Discussed with patient, she has discussed with family Disposition Plan: From home and likely discharge to home pending adequate electrolyte correction Consults called: None Admission status:  Status is: Observation  The patient remains OBS appropriate and will d/c before 2 midnights.  Dispo: The patient is from: Home              Anticipated d/c is to: Home              Anticipated d/c date is: 1 day pending adequate electrolyte correction.              Patient currently is not medically stable to d/c.  Darreld Mclean MD [facility]  If 7PM-7AM, please contact night-coverage [email or web address]  01/13/2020, 12:33 AM

## 2020-01-12 NOTE — Discharge Instructions (Signed)
For you for a new inhaler as well as some steroids for your COPD.  You had a low potassium and sodium level here in the emergency department.  We did give you some IV replacement as well as some fluids.  You need to have close follow-up outpatient with your primary care provider as you need close recheck of your labs.

## 2020-01-12 NOTE — ED Provider Notes (Signed)
Physical Exam  BP 127/71 (BP Location: Right Arm)   Pulse 76   Temp 98.2 F (36.8 C)   Resp 16   Ht 5\' 6"  (1.676 m) Comment: Simultaneous filing. User may not have seen previous data.  Wt 59 kg Comment: Simultaneous filing. User may not have seen previous data.  SpO2 100%   BMI 20.98 kg/m   Physical Exam Vitals and nursing note reviewed.  Constitutional:      General: She is not in acute distress.    Appearance: She is well-developed. She is not diaphoretic.  HENT:     Head: Normocephalic and atraumatic.  Eyes:     General: No scleral icterus.    Conjunctiva/sclera: Conjunctivae normal.  Pulmonary:     Effort: Pulmonary effort is normal. No respiratory distress.  Musculoskeletal:     Cervical back: Normal range of motion.  Skin:    Findings: No rash.  Neurological:     Mental Status: She is alert.     ED Course/Procedures     Procedures  MDM   Care of patient assumed from PA Henderly at [facility].  Agree with history, physical exam and plan.  See their note for further details.  Briefly, 63 y.o. female with PMH/PSH as below who presents with multiple complaints.  Complains of sore throat for several months, nonproductive cough and concern for COPD exacerbation, dysuria, postnasal drainage.  Work-up here so far significant for hypokalemia of 2.7, hyponatremia of 124.  Normal magnesium level.  Urinalysis without signs of infection.  Strep test is negative.  Given IV potassium, magnesium, Solu-Medrol, duo nebs.    Past Medical History:  Diagnosis Date  . Acid reflux   . Arthritis   . Hypertension    Past Surgical History:  Procedure Laterality Date  . COLONOSCOPY WITH PROPOFOL N/A 06/07/2014   Procedure: COLONOSCOPY WITH PROPOFOL;  Surgeon: 14/08/2013, MD;  Location: [facility];  Service: Endoscopy;  Laterality: N/A;  . ESOPHAGOGASTRODUODENOSCOPY (EGD) WITH PROPOFOL N/A 06/07/2014   Procedure: ESOPHAGOGASTRODUODENOSCOPY (EGD) WITH PROPOFOL;  Surgeon: 14/08/2013,  MD;  Location: [facility];  Service: Endoscopy;  Laterality: N/A;      Current Plan: Plan is to recheck after her potassium has finished infusing.   MDM/ED Course: Patient with significant improvement in her symptoms with the above-mentioned medications.  Per prior provider, patient will be discharged home with symptomatic treatment for her COPD including steroids.  I have encouraged her to follow-up for a recheck of her potassium level with her PCP in 1 week.  Of note, while patient was waiting she reports significant improvement in her symptoms but her heart rate did go up to the 160s. Will ambulate her and recheck this with tele and pulse oximetry.  Care handed off to Dr. Willis Modena who will dispo patient.  Consults: None   Significant labs/images: Labs Reviewed  CBC WITH DIFFERENTIAL/PLATELET - Abnormal; Notable for the following components:      Result Value   WBC 2.7 (*)    RBC 3.14 (*)    Hemoglobin 10.3 (*)    HCT 28.0 (*)    MCHC 36.8 (*)    Neutro Abs 1.2 (*)    All other components within normal limits  BASIC METABOLIC PANEL - Abnormal; Notable for the following components:   Sodium 124 (*)    Potassium 2.7 (*)    Chloride 86 (*)    All other components within normal limits  GROUP A STREP BY PCR  URINALYSIS, ROUTINE W REFLEX  MICROSCOPIC  MAGNESIUM  POC SARS CORONAVIRUS 2 AG -  ED  TROPONIN I (HIGH SENSITIVITY)     I personally reviewed and interpreted all labs.  The plan for this patient was discussed with Dr. Effie Shy, who voiced agreement and who oversaw evaluation and treatment of this patient.      Dietrich Pates, PA-C 01/12/20 2211    Mancel Bale, MD 01/16/20 1319

## 2020-01-12 NOTE — ED Provider Notes (Addendum)
[facility] EMERGENCY DEPARTMENT Provider Note   CSN: [id number] Arrival date & time: 01/12/20  1242    History Chief Complaint  Patient presents with  . Fall  . Sore Throat   Anna Mcguire is a 64 y.o. female with past medical history significant for hypertension, acid reflux, COPD who presents for evaluation of multiple complaints.  Patient states she has had sore throat times months.  States this gets worse when the seasons get worse.  She was started on antibiotics months ago which states helped the symptoms however they have returned.  Occasionally she will have clear rhinorrhea.  She denies any lateral neck pain.  No neck stiffness or neck rigidity.  Denies fever chills.  No headache or lightheadedness.  Patient states she has had generalized myalgias.  She states these of been present x1 week after she had a mechanical fall.  She has had a cough which is nonproductive.  She feels short of breath.  States she does have history of COPD has been using her albuterol inhaler at home which has not been helping.  States her symptoms feel similar.  She has not tried any additional medications.  She denies any Covid exposures.  Has not been on any recent steroids however per chart review patient was seen middle of May with COPD exacerbation given azithromycin and prednisone.  Also been seen multiple times for sore throat which her PCP likely related to postnasal drainage.  Does continue to use tobacco.  PCP did recently refer outpatient to pulmonology which patient has not seen yet.  Denies fever, chills, nausea, vomiting, chest pain, hemoptysis, abdominal pain, weakness.  States she did have 1 episode of dysuria earlier today.  No flank pain or hematuria.  Denies additional aggravating or relieving factors.  History obtained from patient and past medical records.  No interpreter is used.  HPI    Past Medical History:  Diagnosis Date  . Acid reflux   . Arthritis   .  Hypertension     Patient Active Problem List   Diagnosis Date Noted  . Smoking addiction 10/06/2019  . Primary osteoarthritis of left knee 09/08/2019  . Pain in right shoulder 08/11/2019    Past Surgical History:  Procedure Laterality Date  . COLONOSCOPY WITH PROPOFOL N/A 06/07/2014   Procedure: COLONOSCOPY WITH PROPOFOL;  Surgeon: Arta Silence, MD;  Location: [facility];  Service: Endoscopy;  Laterality: N/A;  . ESOPHAGOGASTRODUODENOSCOPY (EGD) WITH PROPOFOL N/A 06/07/2014   Procedure: ESOPHAGOGASTRODUODENOSCOPY (EGD) WITH PROPOFOL;  Surgeon: Arta Silence, MD;  Location: [facility];  Service: Endoscopy;  Laterality: N/A;     OB History   No obstetric history on file.     Family History  Problem Relation Age of Onset  . Kidney failure Mother   . Aneurysm Father     Social History   Tobacco Use  . Smoking status: Current Every Day Smoker  . Smokeless tobacco: Never Used  Substance Use Topics  . Alcohol use: Yes    Comment: occ  . Drug use: Yes    Types: Marijuana    Home Medications Prior to Admission medications   Medication Sig Start Date End Date Taking? Authorizing Provider  albuterol (VENTOLIN HFA) 108 (90 Base) MCG/ACT inhaler Inhale 2 puffs into the lungs every 6 (six) hours as needed for wheezing or shortness of breath. 11/16/19  Yes Fulp, Cammie, MD  amLODipine (NORVASC) 10 MG tablet Take 1 tablet (10 mg total) by mouth daily. To lower blood  pressure 06/24/19  Yes Fulp, Cammie, MD  aspirin EC 81 MG tablet Take 1 tablet (81 mg total) by mouth daily. 05/12/19  Yes Wieters, Hallie C, PA-C  atorvastatin (LIPITOR) 80 MG tablet Take 1 tablet (80 mg total) by mouth daily. 06/24/19  Yes Fulp, Cammie, MD  hydrochlorothiazide (HYDRODIURIL) 25 MG tablet Take 1 tablet (25 mg total) by mouth daily. 06/24/19  Yes Fulp, Cammie, MD  loratadine (CLARITIN) 10 MG tablet Take 1 tablet (10 mg total) by mouth daily. 06/24/19  Yes Fulp, Cammie, MD  meloxicam (MOBIC) 15 MG  tablet TAKE 1 TABLET(15 MG) BY MOUTH DAILY AFTER A MEAL AS NEEDED FOR JOINT PAIN Patient taking differently: Take 15 mg by mouth daily.  12/30/19  Yes Fulp, Cammie, MD  pantoprazole (PROTONIX) 40 MG tablet Take 1 tablet (40 mg total) by mouth daily. 06/24/19  Yes Fulp, Cammie, MD  thiamine (VITAMIN B-1) 100 MG tablet Take 100 mg by mouth daily.   Yes [provider]  clindamycin (CLEOCIN) 150 MG capsule Take 1 capsule (150 mg total) by mouth 3 (three) times daily. Patient not taking: Reported on 01/12/2020 11/29/19   Eustace Moore, MD  ipratropium (ATROVENT HFA) 17 MCG/ACT inhaler Inhale 2 puffs into the lungs every 4 (four) hours as needed for wheezing. 01/12/20   Jorie Zee A, PA-C  metroNIDAZOLE (FLAGYL) 500 MG tablet Take 1 tablet (500 mg total) by mouth 2 (two) times daily. Patient not taking: Reported on 01/12/2020 11/29/19   Eustace Moore, MD  predniSONE (DELTASONE) 50 MG tablet Take 1 tablet (50 mg total) by mouth daily for 4 days. 01/12/20 01/16/20  Cai Anfinson A, PA-C  traMADol-acetaminophen (ULTRACET) 37.5-325 MG tablet Take 1-2 tablets by mouth every 6 (six) hours as needed. Patient not taking: Reported on 01/12/2020 11/29/19   Eustace Moore, MD  fluticasone [facility]) 50 MCG/ACT nasal spray Place 1 spray into both nostrils daily.  05/12/19  [provider]  lisinopril-hydrochlorothiazide (PRINZIDE,ZESTORETIC) 20-12.5 MG per tablet Take 1 tablet by mouth daily.  05/12/19  [provider]  lovastatin (MEVACOR) 20 MG tablet Take 20 mg by mouth daily at 6 PM.  05/12/19  [provider]    Allergies    Penicillins  Review of Systems   Review of Systems  Constitutional: Positive for fatigue.  HENT: Positive for postnasal drip, rhinorrhea and sore throat. Negative for congestion, drooling, sinus pain, sneezing, tinnitus and voice change.   Respiratory: Positive for cough and shortness of breath. Negative for apnea, choking, chest tightness,  wheezing and stridor.   Cardiovascular: Negative.   Gastrointestinal: Negative.   Genitourinary: Positive for dysuria. Negative for decreased urine volume, difficulty urinating, dyspareunia, frequency, hematuria, urgency, vaginal bleeding, vaginal discharge and vaginal pain.  Musculoskeletal: Positive for myalgias. Negative for arthralgias, back pain, gait problem, joint swelling, neck pain and neck stiffness.  Skin: Negative.   Neurological: Negative.   All other systems reviewed and are negative.   Physical Exam Updated Vital Signs BP 127/71 (BP Location: Right Arm)   Pulse 76   Temp 98.2 F (36.8 C)   Resp 16   Ht 5\' 6"  (1.676 m) Comment: Simultaneous filing. User may not have seen previous data.  Wt 59 kg Comment: Simultaneous filing. User may not have seen previous data.  SpO2 100%   BMI 20.98 kg/m   Physical Exam Vitals and nursing note reviewed.  Constitutional:      General: She is not in acute distress.    Appearance: She  is not ill-appearing, toxic-appearing or diaphoretic.  HENT:     Head: Normocephalic and atraumatic.     Jaw: There is normal jaw occlusion.     Right Ear: Tympanic membrane, ear canal and external ear normal. There is no impacted cerumen. No hemotympanum. Tympanic membrane is not injected, scarred, perforated, erythematous, retracted or bulging.     Left Ear: Tympanic membrane, ear canal and external ear normal. There is no impacted cerumen. No hemotympanum. Tympanic membrane is not injected, scarred, perforated, erythematous, retracted or bulging.     Ears:     Comments: No Mastoid tenderness.    Nose:     Comments: Clear rhinorrhea and congestion to bilateral nares.  No sinus tenderness.    Mouth/Throat:     Comments: Posterior oropharynx clear.  Mucous membranes moist.  Tonsils without erythema or exudate.  Uvula midline without deviation.  No evidence of PTA or RPA.  No drooling, dysphasia or trismus.  Phonation normal. Neck:     Trachea:  Trachea and phonation normal.     Meningeal: Brudzinski's sign and Kernig's sign absent.     Comments: No Neck stiffness or neck rigidity.  No meningismus.  No cervical lymphadenopathy. Cardiovascular:     Comments: No murmurs rubs or gallops. Pulmonary:     Effort: Pulmonary effort is normal.     Breath sounds: Wheezing present.     Comments: Mild diffuse expiratory wheeze no accessory muscle usage.  Able speak in full sentences. Abdominal:     Comments: Soft, nontender without rebound or guarding.  No CVA tenderness.  Musculoskeletal:     Comments: Moves all 4 extremities without difficulty.  Lower extremities without edema, erythema or warmth.  Skin:    Comments: Brisk capillary refill.  No rashes or lesions.  Neurological:     Mental Status: She is alert.     Comments: Ambulatory in department without difficulty.  Cranial nerves II through XII grossly intact.  No facial droop.  No aphasia.    ED Results / Procedures / Treatments   Labs (all labs ordered are listed, but only abnormal results are displayed) Labs Reviewed  CBC WITH DIFFERENTIAL/PLATELET - Abnormal; Notable for the following components:      Result Value   WBC 2.7 (*)    RBC 3.14 (*)    Hemoglobin 10.3 (*)    HCT 28.0 (*)    MCHC 36.8 (*)    Neutro Abs 1.2 (*)    All other components within normal limits  BASIC METABOLIC PANEL - Abnormal; Notable for the following components:   Sodium 124 (*)    Potassium 2.7 (*)    Chloride 86 (*)    All other components within normal limits  GROUP A STREP BY PCR  URINALYSIS, ROUTINE W REFLEX MICROSCOPIC  MAGNESIUM  POC SARS CORONAVIRUS 2 AG -  ED  TROPONIN I (HIGH SENSITIVITY)    EKG EKG Interpretation  Date/Time:  Thursday January 12 2020 16:54:24 EDT Ventricular Rate:  84 PR Interval:    QRS Duration: 95 QT Interval:  385 QTC Calculation: 456 R Axis:     Text Interpretation: Normal sinus rhythm Non-specific ST-t changes No old tracing to compare Confirmed by  Daleen Bo [id number]) on 01/12/2020 6:27:34 PM   Radiology DG Chest Portable 1 View  Result Date: 01/12/2020 CLINICAL DATA:  64 year old female with shortness of breath. EXAM: PORTABLE CHEST 1 VIEW COMPARISON:  Chest radiograph dated 11/23/2019. FINDINGS: No focal consolidation, pleural effusion, pneumothorax. The cardiac silhouette is  within limits. No acute osseous pathology. IMPRESSION: No active disease. Electronically Signed   By: Anner Crete M.D.   On: 01/12/2020 17:28    Procedures Procedures (including critical care time)  Medications Ordered in ED Medications  albuterol (VENTOLIN HFA) 108 (90 Base) MCG/ACT inhaler 2 puff (2 puffs Inhalation Not Given 01/12/20 1853)  potassium chloride 10 mEq in 100 mL IVPB (10 mEq Intravenous New Bag/Given 01/12/20 1841)  magnesium sulfate IVPB 2 g 50 mL (2 g Intravenous New Bag/Given 01/12/20 1845)  methylPREDNISolone sodium succinate (SOLU-MEDROL) 125 mg/2 mL injection 125 mg (125 mg Intravenous Given 01/12/20 1740)  ipratropium-albuterol (DUONEB) 0.5-2.5 (3) MG/3ML nebulizer solution 3 mL (3 mLs Nebulization Given 01/12/20 1810)  potassium chloride SA (KLOR-CON) CR tablet 40 mEq (40 mEq Oral Given 01/12/20 1837)  sodium chloride 0.9 % bolus 1,000 mL (1,000 mLs Intravenous New Bag/Given 01/12/20 1844)   ED Course  I have reviewed the triage vital signs and the nursing notes.  Pertinent labs & imaging results that were available during my care of the patient were reviewed by me and considered in my medical decision making (see chart for details).  64 year old female presents for evaluation multiple complaints.  She is afebrile, nonseptic, not ill-appearing.  Patient with sore throat however no evidence of PTA or RPA on exam.  She has no drooling, dysphagia or trismus.  Has been seen by her PCP for this as well.  Thought to be postnasal drip and allergy related.  She has no neck stiffness or neck rigidity.  She has no meningismus.  Low suspicion for acute  bacterial infectious process, airway obstruction.  She has no stridor.  She also has some shortness of breath which has been going on over the last 2 days.  States this feels similar to her COPD exacerbation.  She does continue to use tobacco.  Unknown Covid exposures.  She has mild diffuse expiratory wheeze however does not appear in acute respiratory distress.  She has no tachycardia, tachypnea or hypoxia.  Has also had 1 episode of dysuria earlier today as well as some myalgias.  Plan on labs, imaging and reassess  Labs and imaging personally reviewed and interpreted: Strep negative UA negative for infection CBC with WBC at 2.7, prior 7.7, 3.6 Chest x-ray negative for acute infiltrates, cardiomegaly, pulmonary edema, pneumothorax EKG without STEMI, possible T wave however no prior to compare Trop 7, symptoms approximately 24 hours ago.  If likely ACS would feel troponin to be elevated at this time.  Do not feel she needs delta as ACS unlikely. Symptoms likely related to COPD exacerbation. BMP hypokalemia, hyponatremia. Will replace.  Patient reassessed. Ambulatory without difficulty. No hypoxia, mild tachycardia to 100-105 with ambulation however back down to 90's with short rest..  Given metabolic panel findings will check magnesium level however will replete in the meantime.   Patient seen and evaluated by attending, Dr. Eulis Foster.  Recommends replacing potassium, magnesium, 1 L of normal saline. Patient likely dc home with close outpatient follow up if remains hemodynamically stable after electrolyte supplementation.   Discussed close outpatient follow-up with patient.  She is agreeable to this would prefer outpatient follow-up.  She is completing her potassium, magnesium and IV fluids.  She is tolerating p.o. intake without difficulty.    Shelly Coss, PA-C will follow up on reevaluation and determine ultimate disposition.      Clinical Course as of Jan 12 817  Thu Jan 12, 2020  2250 Ambulatory  trial, heart rate increased to  130-140, while walking.  After lying back down to return to the high 90s where she had been earlier.   [EW]  2253 Normal  Magnesium [EW]  2253 Normal except white count low, hemoglobin low  CBC with Differential(!) [EW]  2254 Normal  POC SARS Coronavirus 2 Ag-ED - Nasal Swab (BD Veritor Kit) [EW]  2254 Normal except sodium low, potassium low, chloride low  Basic metabolic panel(!!) [EW]  2637 Normal  Urinalysis, Routine w reflex microscopic [EW]  2254 No infiltrate or edema, interpreted by me  DG Chest Portable 1 View [EW]    Clinical Course User Index [EW] Daleen Bo, MD   MDM Rules/Calculators/A&P                           Final Clinical Impression(s) / ED Diagnoses Final diagnoses:  Sore throat  COPD exacerbation ([place])  Hypokalemia  Hyponatremia    Rx / DC Orders ED Discharge Orders         Ordered    predniSONE (DELTASONE) 50 MG tablet  Daily     Discontinue  Reprint     01/12/20 1833    ipratropium (ATROVENT HFA) 17 MCG/ACT inhaler  Every 4 hours PRN     Discontinue  Reprint     01/12/20 1833           Danilo Cappiello A, PA-C 01/12/20 1903    Orman Matsumura A, PA-C 01/13/20 8588    Milton Ferguson, MD 01/16/20 1007

## 2020-01-13 DIAGNOSIS — J449 Chronic obstructive pulmonary disease, unspecified: Secondary | ICD-10-CM | POA: Diagnosis present

## 2020-01-13 DIAGNOSIS — F1010 Alcohol abuse, uncomplicated: Secondary | ICD-10-CM

## 2020-01-13 DIAGNOSIS — Z79899 Other long term (current) drug therapy: Secondary | ICD-10-CM | POA: Diagnosis not present

## 2020-01-13 DIAGNOSIS — Z841 Family history of disorders of kidney and ureter: Secondary | ICD-10-CM | POA: Diagnosis not present

## 2020-01-13 DIAGNOSIS — I10 Essential (primary) hypertension: Secondary | ICD-10-CM | POA: Diagnosis present

## 2020-01-13 DIAGNOSIS — E871 Hypo-osmolality and hyponatremia: Secondary | ICD-10-CM | POA: Diagnosis present

## 2020-01-13 DIAGNOSIS — K529 Noninfective gastroenteritis and colitis, unspecified: Secondary | ICD-10-CM | POA: Diagnosis not present

## 2020-01-13 DIAGNOSIS — F17210 Nicotine dependence, cigarettes, uncomplicated: Secondary | ICD-10-CM | POA: Diagnosis present

## 2020-01-13 DIAGNOSIS — Z7289 Other problems related to lifestyle: Secondary | ICD-10-CM | POA: Diagnosis not present

## 2020-01-13 DIAGNOSIS — B9620 Unspecified Escherichia coli [E. coli] as the cause of diseases classified elsewhere: Secondary | ICD-10-CM | POA: Diagnosis present

## 2020-01-13 DIAGNOSIS — J441 Chronic obstructive pulmonary disease with (acute) exacerbation: Secondary | ICD-10-CM

## 2020-01-13 DIAGNOSIS — Z789 Other specified health status: Secondary | ICD-10-CM

## 2020-01-13 DIAGNOSIS — Z20822 Contact with and (suspected) exposure to covid-19: Secondary | ICD-10-CM | POA: Diagnosis present

## 2020-01-13 DIAGNOSIS — Z720 Tobacco use: Secondary | ICD-10-CM | POA: Diagnosis present

## 2020-01-13 DIAGNOSIS — Z7982 Long term (current) use of aspirin: Secondary | ICD-10-CM | POA: Diagnosis not present

## 2020-01-13 DIAGNOSIS — E785 Hyperlipidemia, unspecified: Secondary | ICD-10-CM | POA: Diagnosis present

## 2020-01-13 DIAGNOSIS — K219 Gastro-esophageal reflux disease without esophagitis: Secondary | ICD-10-CM | POA: Diagnosis present

## 2020-01-13 DIAGNOSIS — E876 Hypokalemia: Secondary | ICD-10-CM | POA: Diagnosis not present

## 2020-01-13 DIAGNOSIS — F1090 Alcohol use, unspecified, uncomplicated: Secondary | ICD-10-CM

## 2020-01-13 LAB — BASIC METABOLIC PANEL
Anion gap: 11 (ref 5–15)
BUN: 7 mg/dL — ABNORMAL LOW (ref 8–23)
CO2: 22 mmol/L (ref 22–32)
Calcium: 9.3 mg/dL (ref 8.9–10.3)
Chloride: 96 mmol/L — ABNORMAL LOW (ref 98–111)
Creatinine, Ser: 0.93 mg/dL (ref 0.44–1.00)
GFR calc Af Amer: >60 mL/min (ref >60)
GFR calc non Af Amer: >60 mL/min (ref >60)
Glucose, Bld: 190 mg/dL — ABNORMAL HIGH (ref 70–99)
Potassium: 3.3 mmol/L — ABNORMAL LOW (ref 3.5–5.1)
Sodium: 129 mmol/L — ABNORMAL LOW (ref 135–145)

## 2020-01-13 LAB — CBC
HCT: 28.5 % — ABNORMAL LOW (ref 36.0–46.0)
HCT: 27.9 % — ABNORMAL LOW (ref 36.0–46.0)
Hemoglobin: 10.1 g/dL — ABNORMAL LOW (ref 12.0–15.0)
Hemoglobin: 9.6 g/dL — ABNORMAL LOW (ref 12.0–15.0)
MCH: 32 pg (ref 26.0–34.0)
MCH: 31.5 pg (ref 26.0–34.0)
MCHC: 35.4 g/dL (ref 30.0–36.0)
MCHC: 34.4 g/dL (ref 30.0–36.0)
MCV: 90.2 fL (ref 80.0–100.0)
MCV: 91.5 fL (ref 80.0–100.0)
Platelets: 267 10*3/uL (ref 150–400)
Platelets: 274 10*3/uL (ref 150–400)
RBC: 3.16 MIL/uL — ABNORMAL LOW (ref 3.87–5.11)
RBC: 3.05 MIL/uL — ABNORMAL LOW (ref 3.87–5.11)
RDW: 11.9 % (ref 11.5–15.5)
RDW: 12.5 % (ref 11.5–15.5)
WBC: 2.4 10*3/uL — ABNORMAL LOW (ref 4.0–10.5)
WBC: 7.2 10*3/uL (ref 4.0–10.5)
nRBC: 0 % (ref 0.0–0.2)
nRBC: 0 % (ref 0.0–0.2)

## 2020-01-13 LAB — COMPREHENSIVE METABOLIC PANEL
ALT: 24 U/L (ref 0–44)
AST: 39 U/L (ref 15–41)
Albumin: 3.6 g/dL (ref 3.5–5.0)
Alkaline Phosphatase: 85 U/L (ref 38–126)
Anion gap: 11 (ref 5–15)
BUN: 9 mg/dL (ref 8–23)
CO2: 22 mmol/L (ref 22–32)
Calcium: 9.7 mg/dL (ref 8.9–10.3)
Chloride: 95 mmol/L — ABNORMAL LOW (ref 98–111)
Creatinine, Ser: 1.12 mg/dL — ABNORMAL HIGH (ref 0.44–1.00)
GFR calc Af Amer: 59 mL/min — ABNORMAL LOW (ref >60)
GFR calc non Af Amer: 51 mL/min — ABNORMAL LOW (ref >60)
Glucose, Bld: 115 mg/dL — ABNORMAL HIGH (ref 70–99)
Potassium: 3.8 mmol/L (ref 3.5–5.1)
Sodium: 128 mmol/L — ABNORMAL LOW (ref 135–145)
Total Bilirubin: 0.8 mg/dL (ref 0.3–1.2)
Total Protein: 5.6 g/dL — ABNORMAL LOW (ref 6.5–8.1)

## 2020-01-13 LAB — MAGNESIUM
Magnesium: 2.1 mg/dL (ref 1.7–2.4)
Magnesium: 2.1 mg/dL (ref 1.7–2.4)

## 2020-01-13 LAB — PHOSPHORUS: Phosphorus: <1 mg/dL — CL (ref 2.5–4.6)

## 2020-01-13 LAB — SODIUM, URINE, RANDOM: Sodium, Ur: 20 mmol/L

## 2020-01-13 LAB — OSMOLALITY
Osmolality: 272 mOsm/kg — ABNORMAL LOW (ref 275–295)
Osmolality: 268 mOsm/kg — ABNORMAL LOW (ref 275–295)

## 2020-01-13 LAB — OSMOLALITY, URINE: Osmolality, Ur: 187 mOsm/kg — ABNORMAL LOW (ref 300–900)

## 2020-01-13 LAB — HIV ANTIBODY (ROUTINE TESTING W REFLEX): HIV Screen 4th Generation wRfx: NONREACTIVE

## 2020-01-13 MED ORDER — NICOTINE 21 MG/24HR TD PT24
21.0000 mg | MEDICATED_PATCH | Freq: Every day | TRANSDERMAL | Status: DC
  Administered 2020-01-13 – 2020-01-17 (×5): 21 mg via TRANSDERMAL
  Filled 2020-01-13 (×5): qty 1

## 2020-01-13 MED ORDER — ACETAMINOPHEN 650 MG RE SUPP: 650.0000 mg | Freq: Four times a day (QID) | RECTAL | Status: DC | PRN

## 2020-01-13 MED ORDER — ALBUTEROL SULFATE (2.5 MG/3ML) 0.083% IN NEBU: 2.5000 mg | INHALATION_SOLUTION | RESPIRATORY_TRACT | Status: DC | PRN

## 2020-01-13 MED ORDER — LORAZEPAM 2 MG/ML IJ SOLN: 1.0000 mg | INTRAMUSCULAR | Status: AC | PRN

## 2020-01-13 MED ORDER — ONDANSETRON HCL 4 MG/2ML IJ SOLN: 4.0000 mg | Freq: Four times a day (QID) | INTRAMUSCULAR | Status: DC | PRN

## 2020-01-13 MED ORDER — THIAMINE HCL 100 MG PO TABS
100.0000 mg | ORAL_TABLET | Freq: Every day | ORAL | Status: DC
  Administered 2020-01-13 – 2020-01-17 (×3): 100 mg via ORAL
  Filled 2020-01-13 (×4): qty 1

## 2020-01-13 MED ORDER — ATORVASTATIN CALCIUM 80 MG PO TABS
80.0000 mg | ORAL_TABLET | Freq: Every day | ORAL | Status: DC
  Administered 2020-01-13 – 2020-01-16 (×4): 80 mg via ORAL
  Filled 2020-01-13 (×4): qty 1

## 2020-01-13 MED ORDER — SODIUM CHLORIDE 0.9% FLUSH
3.0000 mL | Freq: Two times a day (BID) | INTRAVENOUS | Status: DC
  Administered 2020-01-13 – 2020-01-17 (×8): 3 mL via INTRAVENOUS

## 2020-01-13 MED ORDER — FOLIC ACID 1 MG PO TABS
1.0000 mg | ORAL_TABLET | Freq: Every day | ORAL | Status: DC
  Administered 2020-01-13 – 2020-01-17 (×5): 1 mg via ORAL
  Filled 2020-01-13 (×5): qty 1

## 2020-01-13 MED ORDER — AMLODIPINE BESYLATE 10 MG PO TABS
10.0000 mg | ORAL_TABLET | Freq: Every day | ORAL | Status: DC
  Administered 2020-01-13 – 2020-01-17 (×5): 10 mg via ORAL
  Filled 2020-01-13 (×5): qty 1

## 2020-01-13 MED ORDER — ONDANSETRON HCL 4 MG PO TABS: 4.0000 mg | ORAL_TABLET | Freq: Four times a day (QID) | ORAL | Status: DC | PRN

## 2020-01-13 MED ORDER — SODIUM PHOSPHATES 45 MMOLE/15ML IV SOLN
30.0000 mmol | Freq: Once | INTRAVENOUS | Status: AC
  Administered 2020-01-13: 30 mmol via INTRAVENOUS
  Filled 2020-01-13: qty 10

## 2020-01-13 MED ORDER — IPRATROPIUM-ALBUTEROL 0.5-2.5 (3) MG/3ML IN SOLN
3.0000 mL | Freq: Four times a day (QID) | RESPIRATORY_TRACT | Status: DC | PRN
  Administered 2020-01-14: 3 mL via RESPIRATORY_TRACT
  Filled 2020-01-13: qty 3

## 2020-01-13 MED ORDER — LORAZEPAM 2 MG/ML IJ SOLN: 0.0000 mg | INTRAMUSCULAR | Status: AC

## 2020-01-13 MED ORDER — ACETAMINOPHEN 325 MG PO TABS
650.0000 mg | ORAL_TABLET | Freq: Four times a day (QID) | ORAL | Status: DC | PRN
  Administered 2020-01-14 – 2020-01-16 (×2): 650 mg via ORAL
  Filled 2020-01-13 (×4): qty 2

## 2020-01-13 MED ORDER — ADULT MULTIVITAMIN W/MINERALS CH
1.0000 | ORAL_TABLET | Freq: Every day | ORAL | Status: DC
  Administered 2020-01-13 – 2020-01-17 (×5): 1 via ORAL
  Filled 2020-01-13 (×5): qty 1

## 2020-01-13 MED ORDER — SODIUM CHLORIDE 0.9 % IV SOLN: INTRAVENOUS | Status: DC

## 2020-01-13 MED ORDER — LORAZEPAM 1 MG PO TABS: 1.0000 mg | ORAL_TABLET | ORAL | Status: AC | PRN

## 2020-01-13 MED ORDER — POTASSIUM CHLORIDE 10 MEQ/100ML IV SOLN
10.0000 meq | INTRAVENOUS | Status: AC
  Administered 2020-01-13 (×4): 10 meq via INTRAVENOUS
  Filled 2020-01-13 (×4): qty 100

## 2020-01-13 MED ORDER — THIAMINE HCL 100 MG PO TABS
100.0000 mg | ORAL_TABLET | Freq: Every day | ORAL | Status: DC
  Administered 2020-01-15 – 2020-01-17 (×3): 100 mg via ORAL
  Filled 2020-01-13 (×4): qty 1

## 2020-01-13 MED ORDER — THIAMINE HCL 100 MG/ML IJ SOLN: 100.0000 mg | Freq: Every day | INTRAMUSCULAR | Status: DC

## 2020-01-13 MED ORDER — ENOXAPARIN SODIUM 40 MG/0.4ML ~~LOC~~ SOLN
40.0000 mg | Freq: Every day | SUBCUTANEOUS | Status: DC
  Administered 2020-01-13 – 2020-01-16 (×4): 40 mg via SUBCUTANEOUS
  Filled 2020-01-13 (×4): qty 0.4

## 2020-01-13 MED ORDER — POTASSIUM CHLORIDE 10 MEQ/100ML IV SOLN
10.0000 meq | INTRAVENOUS | Status: AC
  Administered 2020-01-13 (×3): 10 meq via INTRAVENOUS
  Filled 2020-01-13 (×3): qty 100

## 2020-01-13 MED ORDER — LORAZEPAM 2 MG/ML IJ SOLN: 0.0000 mg | Freq: Three times a day (TID) | INTRAMUSCULAR | Status: DC

## 2020-01-13 NOTE — TOC Initial Note (Signed)
Transition of Care [facility] - Initial/Assessment Note    Patient Details  Name: Anna Mcguire MRN: [id number] Date of Birth: 01-18-1956  Transition of [facility] CM/SW Contact:    Curlene Labrum, RN Phone Number: 01/13/2020, 2:48 PM  Clinical Narrative:                 Case Management met with the patient at the bedside concerning patient's discharge plans and initial presentation for SOB.  Patient lives alone but has a daughter that lives here in [place].  She patient was given the [facility] document and is waiting on medical workup.  Will continue to follow for discharge needs.  Patient does not have home health services at this time and has a cane for ambulation.  Expected Discharge Plan: Home/Self Care Barriers to Discharge: No Barriers Identified   Patient Goals and CMS Choice Patient states their goals for this hospitalization and ongoing recovery are:: Patient states that she wants to feel better.      Expected Discharge Plan and Services Expected Discharge Plan: Home/Self Care   Discharge Planning Services: CM Consult   Living arrangements for the past 2 months: Single Family Home                                      Prior Living Arrangements/Services Living arrangements for the past 2 months: Single Family Home Lives with:: Self Patient language and need for interpreter reviewed:: Yes Do you feel safe going back to the place where you live?: Yes      Need for Family Participation in Patient Care: Yes (Comment) Care giver support system in place?: Yes (comment) Current home services: DME (Patient hasf a cane) Criminal Activity/Legal Involvement Pertinent to Current Situation/Hospitalization: No - Comment as needed  Activities of Daily Living Home Assistive Devices/Equipment: Walker (specify type) (cane) ADL Screening (condition at time of admission) Patient's cognitive ability adequate to safely complete daily activities?: Yes Is the patient  deaf or have difficulty hearing?: No Does the patient have difficulty seeing, even when wearing glasses/contacts?: No Does the patient have difficulty concentrating, remembering, or making decisions?: No Patient able to express need for assistance with ADLs?: No Does the patient have difficulty dressing or bathing?: No Independently performs ADLs?: Yes (appropriate for developmental age) Does the patient have difficulty walking or climbing stairs?: No Weakness of Legs: None Weakness of Arms/Hands: None  Permission Sought/Granted Permission sought to share information with : Case Manager Permission granted to share information with : Yes, Verbal Permission Granted        Permission granted to share info w Relationship: family - daughter lives in [place], [place]     Emotional Assessment Appearance:: Appears stated age Attitude/Demeanor/Rapport: Gracious Affect (typically observed): Accepting Orientation: : Oriented to Self, Oriented to Place, Oriented to  Time, Oriented to Situation Alcohol / Substance Use: Tobacco Use, Alcohol Use Psych Involvement: No (comment)  Admission diagnosis:  Hypokalemia [E87.6] Hyponatremia [E87.1] Sore throat [J02.9] COPD exacerbation (HCC) [J44.1] Patient Active Problem List   Diagnosis Date Noted   Hyponatremia 01/13/2020   Alcohol abuse 01/13/2020   Tobacco abuse 01/13/2020   Hypertension    COPD with acute exacerbation (HCC)    Alcohol use    Hypokalemia 01/12/2020   Tobacco use 10/06/2019   Primary osteoarthritis of left knee 09/08/2019   Pain in right shoulder 08/11/2019   PCP:  Antony Blackbird, MD Pharmacy:  [id number] - [place], [place] - [address] °[address] °[place] [address] °Phone: [phone] Fax: [phone] ° ° ° ° °Social Determinants of Health (SDOH) Interventions °  ° °Readmission Risk Interventions °No flowsheet data found. ° °

## 2020-01-13 NOTE — Plan of Care (Signed)

## 2020-01-13 NOTE — Care Management Obs Status (Signed)
MEDICARE OBSERVATION STATUS NOTIFICATION   Patient Details  Name: Anna Mcguire MRN: [id number] Date of Birth: September 15, 1955   Medicare Observation Status Notification Given:  Yes    Janae Bridgeman, RN 01/13/2020, 2:39 PM

## 2020-01-13 NOTE — Progress Notes (Signed)
Dr Joseph Art notified of critical lab: phosphorus less than one- notified via secure chat

## 2020-01-13 NOTE — Progress Notes (Signed)
PROGRESS NOTE    Anna Mcguire  WVP:[id number] DOB: 08/08/1955 DOA: 01/12/2020 PCP: [name], MD     Brief Narrative:  64 y.o. BF PMHx HTN, HLD, COPD, EtOH abuse, tobacco abuse,   Presents to the ED for evaluation of shortness of breath.  Patient states she has been having 1 to 2 weeks of sore throat, dry cough, and occasional shortness of breath.  She has been treated by her primary care for suspected COPD with antibiotics and bronchodilators.  She also reports 1 month of frequent loose stools with last bowel movement morning of 01/12/2020.  She has not seen any obvious bleeding.  She denies any subjective fevers or diaphoresis but has had some chills.  She denies any chest pain, abdominal pain, dysuria, or peripheral edema.  She is a current smoker, reports smoking 1 pack/day for 20 years.  She reports chronic daily alcohol use consisting of two-three 40 ounce beers per day.  She denies any cocaine or IV drug use.  ED Course:  Initial vitals showed BP 120/72, pulse 84, RR 17, temp 98.3 Fahrenheit, SPO2 98% on room air.  Labs are notable for potassium 2.7, magnesium 1.8, sodium 124, bicarb 24, BUN 9, creatinine 0.96, serum glucose 79, WBC 2.7, hemoglobin 10.3, platelets 274,000, high-sensitivity troponin I 7.  Urinalysis is negative for UTI.  POC SARS-CoV-2 antigen test is negative.  Portable chest x-ray is negative for focal consolidation, edema, or effusion.  Patient was given 2 L normal saline, DuoNeb treatment, IV magnesium 2 g, IV Solu-Medrol 125 mg, IV K 10 mEq x 2 and oral K 40 mEq x 1.  The hospitalist service was consulted to admit for further evaluation and management.   Subjective: A/O x4, negative abdominal pain, negative CP, negative S OB. States has had diarrhea x2 months   Assessment & Plan: Covid vaccination; positive vaccination   Principal Problem:   Hypokalemia Active Problems:   Tobacco use   Hyponatremia   Hypertension   COPD with acute  exacerbation (HCC)   Alcohol use   Alcohol abuse   Tobacco abuse   Chronic diarrhea   Hypophosphatemia    Hyponatremia: -Sodium 124 on admission.  Also likely multifactorial from GI losses, HCTZ, and alcohol use. -S/p 2 L normal saline bolus in the ED -Serum osmolality, urine osmolality, urine sodium pending  -Normal saline 40ml/hr -Hold HCTZ  COPD with acute exacerbation: Treated for presumed COPD exacerbation on ED arrival with significant improvement after receiving IV Solu-Medrol and DuoNeb treatment.  No active wheezing at time of admission and saturating well on room air. -Continue as needed duo nebs and albuterol  Hypertension: Continue home amlodipine.  Hold HCTZ as above.  Hyperlipidemia: Continue atorvastatin.  Tobacco use: Smoking 1 pack/day for last 20 years.  She is advised on smoking cessation.  Nicotine patch ordered.  EtOH abuse -Reports drinking 2-3 forty ounce beers daily.  She is advised on alcohol cessation.   -CIWA protocol -7/9 urine rapid drug screen pending  Hypokalemia -K 2.7 on admission with magnesium 1.8.  Likely multifactorial from GI losses and HCTZ use.  IV repletion initiated in the ED, Hold HCTZ. -Potassium IV 60 mEq -1700 repeat CMP,Mg, CBC  Hypophosphatemia -On repeat lab draw patient severely hypophosphatemic -Sodium phosphate 30 mmol  Diarrhea x2 months (chronic) -Most likely secondary to her alcoholism however for completeness will obtain stool sample for PCR -Full liquid diet -GI panel pending -Lactoferrin pending   DVT prophylaxis: Lovenox Code Status: Full Family Communication:  Status is: Inpatient    Dispo: The patient is from: Home              Anticipated d/c is to: Home              Anticipated d/c date is: 7/14              Patient currently unstable      Consultants:    Procedures/Significant Events:    I have personally reviewed and interpreted all radiology studies and my findings are as  above.  VENTILATOR SETTINGS:    Cultures 7/8 SARS coronavirus negative 7/8 group A strep by PCR negative 7/9 GI panel by PCR pending  Antimicrobials:    Devices    LINES / TUBES:      Continuous Infusions: . sodium chloride 75 mL/hr at 01/13/20 1721  . sodium phosphate  Dextrose 5% IVPB       Objective: Vitals:   01/13/20 0156 01/13/20 0806 01/13/20 1437 01/13/20 1923  BP: (!) 160/50 (!) 151/85 (!) 141/79 (!) 148/81  Pulse: 99 99 96 96  Resp: 17 17 16 17   Temp: 98.3 F (36.8 C) 98.7 F (37.1 C) 99.1 F (37.3 C) 98.4 F (36.9 C)  TempSrc: Oral Oral Oral Oral  SpO2: 100% 99% 100% 100%  Weight:      Height:        Intake/Output Summary (Last 24 hours) at 01/13/2020 1955 Last data filed at 01/13/2020 1700 Gross per 24 hour  Intake 841.85 ml  Output --  Net 841.85 ml   Filed Weights   01/12/20 1307  Weight: 59 kg    Examination:  General: A/O x4 no acute respiratory distress Eyes: negative scleral hemorrhage, negative anisocoria, negative icterus ENT: Negative Runny nose, negative gingival bleeding, Neck:  Negative scars, masses, torticollis, lymphadenopathy, JVD Lungs: Clear to auscultation bilaterally without wheezes or crackles Cardiovascular: Regular rate and rhythm without murmur gallop or rub normal S1 and S2 Abdomen: Positive mild abdominal pain to palpation, nondistended, positive soft, bowel sounds, no rebound, no ascites, no appreciable mass Extremities: No significant cyanosis, clubbing, or edema bilateral lower extremities Skin: Negative rashes, lesions, ulcers Psychiatric:  Negative depression, negative anxiety, negative fatigue, negative mania  Central nervous system:  Cranial nerves II through XII intact, tongue/uvula midline, all extremities muscle strength 5/5, sensation intact throughout, negative dysarthria, negative expressive aphasia, negative receptive aphasia.  .     Data Reviewed: Care during the described time interval was  provided by me .  I have reviewed this patient's available data, including medical history, events of note, physical examination, and all test results as part of my evaluation.  CBC: Recent Labs  Lab 01/12/20 1700 01/13/20 0210 01/13/20 1747  WBC 2.7* 2.4* 7.2  NEUTROABS 1.2*  --   --   HGB 10.3* 10.1* 9.6*  HCT 28.0* 28.5* 27.9*  MCV 89.2 90.2 91.5  PLT 274 267 274   Basic Metabolic Panel: Recent Labs  Lab 01/12/20 1700 01/12/20 1844 01/13/20 0210 01/13/20 1747  NA 124*  --  129* 128*  K 2.7*  --  3.3* 3.8  CL 86*  --  96* 95*  CO2 24  --  22 22  GLUCOSE 79  --  190* 115*  BUN 9  --  7* 9  CREATININE 0.96  --  0.93 1.12*  CALCIUM 9.6  --  9.3 9.7  MG  --  1.8 2.1 2.1  PHOS  --   --   --  <  1.0*   GFR: Estimated Creatinine Clearance: 46 mL/min (A) (by C-G formula based on SCr of 1.12 mg/dL (H)). Liver Function Tests: Recent Labs  Lab 01/13/20 1747  AST 39  ALT 24  ALKPHOS 85  BILITOT 0.8  PROT 5.6*  ALBUMIN 3.6   No results for input(s): LIPASE, AMYLASE in the last 168 hours. No results for input(s): AMMONIA in the last 168 hours. Coagulation Profile: No results for input(s): INR, PROTIME in the last 168 hours. Cardiac Enzymes: No results for input(s): CKTOTAL, CKMB, CKMBINDEX, TROPONINI in the last 168 hours. BNP (last 3 results) No results for input(s): PROBNP in the last 8760 hours. HbA1C: No results for input(s): HGBA1C in the last 72 hours. CBG: No results for input(s): GLUCAP in the last 168 hours. Lipid Profile: No results for input(s): CHOL, HDL, LDLCALC, TRIG, CHOLHDL, LDLDIRECT in the last 72 hours. Thyroid Function Tests: No results for input(s): TSH, T4TOTAL, FREET4, T3FREE, THYROIDAB in the last 72 hours. Anemia Panel: No results for input(s): VITAMINB12, FOLATE, FERRITIN, TIBC, IRON, RETICCTPCT in the last 72 hours. Sepsis Labs: No results for input(s): PROCALCITON, LATICACIDVEN in the last 168 hours.  Recent Results (from the past 240  hour(s))  Group A Strep by PCR     Status: None   Collection Time: 01/12/20  1:08 PM   Specimen: Throat; Sterile Swab  Result Value Ref Range Status   Group A Strep by PCR NOT DETECTED NOT DETECTED Final    Comment: Performed at [facility], [address], [place], [place] [address]         Radiology Studies: DG Chest Portable 1 View  Result Date: 01/12/2020 CLINICAL DATA:  64 year old female with shortness of breath. EXAM: PORTABLE CHEST 1 VIEW COMPARISON:  Chest radiograph dated 11/23/2019. FINDINGS: No focal consolidation, pleural effusion, pneumothorax. The cardiac silhouette is within limits. No acute osseous pathology. IMPRESSION: No active disease. Electronically Signed   By: Elgie Collard M.D.   On: 01/12/2020 17:28        Scheduled Meds: . amLODipine  10 mg Oral Daily  . atorvastatin  80 mg Oral q1800  . enoxaparin (LOVENOX) injection  40 mg Subcutaneous QHS  . folic acid  1 mg Oral Daily  . LORazepam  0-4 mg Intravenous Q4H   Followed by  . [START ON 01/15/2020] LORazepam  0-4 mg Intravenous Q8H  . multivitamin with minerals  1 tablet Oral Daily  . nicotine  21 mg Transdermal Daily  . sodium chloride flush  3 mL Intravenous Q12H  . thiamine  100 mg Oral Daily   Or  . thiamine  100 mg Intravenous Daily  . thiamine  100 mg Oral Daily   Continuous Infusions: . sodium chloride 75 mL/hr at 01/13/20 1721  . sodium phosphate  Dextrose 5% IVPB       LOS: 0 days    Time spent:40 min    Dennie Vecchio, Roselind Messier, MD [facility] Pager [phone]  If 7PM-7AM, please contact night-coverage [email or web address] Password [email or web address] 01/13/2020, 7:55 PM

## 2020-01-13 NOTE — ED Notes (Signed)
Dr. Allena Katz stated she could have something to eat. [place] sandwich provided to pt.

## 2020-01-13 NOTE — Plan of Care (Signed)

## 2020-01-14 LAB — CBC WITH DIFFERENTIAL/PLATELET
Abs Immature Granulocytes: 0.02 10*3/uL (ref 0.00–0.07)
Basophils Absolute: 0.1 10*3/uL (ref 0.0–0.1)
Basophils Relative: 1 %
Eosinophils Absolute: 0 10*3/uL (ref 0.0–0.5)
Eosinophils Relative: 0 %
HCT: 26 % — ABNORMAL LOW (ref 36.0–46.0)
Hemoglobin: 8.7 g/dL — ABNORMAL LOW (ref 12.0–15.0)
Immature Granulocytes: 0 %
Lymphocytes Relative: 24 %
Lymphs Abs: 1.3 10*3/uL (ref 0.7–4.0)
MCH: 31.4 pg (ref 26.0–34.0)
MCHC: 33.5 g/dL (ref 30.0–36.0)
MCV: 93.9 fL (ref 80.0–100.0)
Monocytes Absolute: 0.4 10*3/uL (ref 0.1–1.0)
Monocytes Relative: 8 %
Neutro Abs: 3.5 10*3/uL (ref 1.7–7.7)
Neutrophils Relative %: 67 %
Platelets: 247 10*3/uL (ref 150–400)
RBC: 2.77 MIL/uL — ABNORMAL LOW (ref 3.87–5.11)
RDW: 12.7 % (ref 11.5–15.5)
WBC: 5.3 10*3/uL (ref 4.0–10.5)
nRBC: 0 % (ref 0.0–0.2)

## 2020-01-14 LAB — RAPID URINE DRUG SCREEN, HOSP PERFORMED
Amphetamines: NOT DETECTED
Barbiturates: NOT DETECTED
Benzodiazepines: NOT DETECTED
Cocaine: NOT DETECTED
Opiates: NOT DETECTED
Tetrahydrocannabinol: POSITIVE — AB

## 2020-01-14 LAB — GASTROINTESTINAL PANEL BY PCR, STOOL (REPLACES STOOL CULTURE)

## 2020-01-14 LAB — COMPREHENSIVE METABOLIC PANEL
ALT: 20 U/L (ref 0–44)
AST: 38 U/L (ref 15–41)
Albumin: 3.2 g/dL — ABNORMAL LOW (ref 3.5–5.0)
Alkaline Phosphatase: 75 U/L (ref 38–126)
Anion gap: 9 (ref 5–15)
BUN: 7 mg/dL — ABNORMAL LOW (ref 8–23)
CO2: 25 mmol/L (ref 22–32)
Calcium: 9.4 mg/dL (ref 8.9–10.3)
Chloride: 98 mmol/L (ref 98–111)
Creatinine, Ser: 0.97 mg/dL (ref 0.44–1.00)
GFR calc Af Amer: >60 mL/min (ref >60)
GFR calc non Af Amer: >60 mL/min (ref >60)
Glucose, Bld: 97 mg/dL (ref 70–99)
Potassium: 3 mmol/L — ABNORMAL LOW (ref 3.5–5.1)
Sodium: 132 mmol/L — ABNORMAL LOW (ref 135–145)
Total Bilirubin: 0.9 mg/dL (ref 0.3–1.2)
Total Protein: 5 g/dL — ABNORMAL LOW (ref 6.5–8.1)

## 2020-01-14 LAB — PHOSPHORUS: Phosphorus: 3.9 mg/dL (ref 2.5–4.6)

## 2020-01-14 LAB — MAGNESIUM: Magnesium: 1.7 mg/dL (ref 1.7–2.4)

## 2020-01-14 LAB — LACTOFERRIN, FECAL, QUALITATIVE: Lactoferrin, Fecal, Qual: NEGATIVE

## 2020-01-14 MED ORDER — MAGNESIUM SULFATE 2 GM/50ML IV SOLN
2.0000 g | Freq: Once | INTRAVENOUS | Status: AC
  Administered 2020-01-14: 2 g via INTRAVENOUS
  Filled 2020-01-14: qty 50

## 2020-01-14 MED ORDER — LISINOPRIL 5 MG PO TABS
2.5000 mg | ORAL_TABLET | Freq: Every day | ORAL | Status: DC
  Administered 2020-01-14 – 2020-01-15 (×2): 2.5 mg via ORAL
  Filled 2020-01-14 (×2): qty 1

## 2020-01-14 MED ORDER — POTASSIUM CHLORIDE CRYS ER 20 MEQ PO TBCR
40.0000 meq | EXTENDED_RELEASE_TABLET | Freq: Two times a day (BID) | ORAL | Status: AC
  Administered 2020-01-14 (×2): 40 meq via ORAL
  Filled 2020-01-14 (×2): qty 2

## 2020-01-14 NOTE — Progress Notes (Signed)
Pt complained of SOB. Pt instructed to do Tripod position. Pt had audible wheezing. Pt o2 100%. PRN duoneb given. PRN effective as per pt

## 2020-01-14 NOTE — Plan of Care (Signed)
  Problem: Education: Goal: Knowledge of General Education information will improve Description: Including pain rating scale, medication(s)/side effects and non-pharmacologic comfort measures Outcome: Progressing   Problem: Clinical Measurements: Goal: Will remain free from infection Outcome: Progressing Goal: Diagnostic test results will improve Outcome: Progressing   Problem: Activity: Goal: Risk for activity intolerance will decrease Outcome: Progressing   Problem: Clinical Measurements: Goal: Respiratory complications will improve Outcome: Not Progressing

## 2020-01-14 NOTE — Progress Notes (Signed)
Physical Therapy Evaluation Patient Details Name: Anna Mcguire MRN: [id number] DOB: 1955-12-02 Today's Date: 01/14/2020   History of Present Illness  Pateint was admitted to the hosptial with an exacerbation of her COPD and a sore throat. She has soreness in bilateral hips following a fall. PMH: arthris HTN, Acid reflux   Clinical Impression  Patient appears to be near her baseline with mild deficits in endurance with gait. Skilled therapy will continue to work with her for endurance while she is admitted. She lives with her daughter who will be able to help her at home.     Follow Up Recommendations Supervision/Assistance - 24 hour    Equipment Recommendations  Hospital bed    Recommendations for Other Services       Precautions / Restrictions Precautions Precautions: None Restrictions Weight Bearing Restrictions: No      Mobility  Bed Mobility               General bed mobility comments: Patient found in a chair. She reported getting out of bed without difficulty   Transfers Overall transfer level: Modified independent Equipment used: Straight cane             General transfer comment: stood without a device   Ambulation/Gait Ambulation/Gait assistance: Supervision Gait Distance (Feet): 120 Feet Assistive device: Rolling walker (2 wheeled)   Gait velocity: decreased  Gait velocity interpretation: <1.31 ft/sec, indicative of household ambulator General Gait Details: patients gait sspeed decreased as she became fatigued. Mild SOB noted towards the end of ambualtion. She had good stability with sigle point cane   Stairs            Wheelchair Mobility    Modified Rankin (Stroke Patients Only)       Balance Overall balance assessment: Needs assistance Sitting-balance support: No upper extremity supported;Feet unsupported Sitting balance-Leahy Scale: Good     Standing balance support: Single extremity supported Standing balance-Leahy  Scale: Good Standing balance comment: no syncope or loss of balance with gait                              Pertinent Vitals/Pain Pain Assessment: Faces Faces Pain Scale: No hurt Pain Location: Patient reported pain in her hips but shwed no signs with gait  Pain Descriptors / Indicators: Aching Pain Intervention(s): Monitored during session;Limited activity within patient's tolerance    Home Living Family/patient expects to be discharged to:: Private residence Living Arrangements: Children Available Help at Discharge: Family Type of Home: Apartment Home Access: Level entry         Additional Comments: Patients daughter is currently living with her but dosent always     Prior Function Level of Independence: Independent with assistive device(s)         Comments: used a cane but patient reports she was active      Hand Dominance   Dominant Hand: Right    Extremity/Trunk Assessment   Upper Extremity Assessment Upper Extremity Assessment: Overall WFL for tasks assessed;Defer to OT evaluation    Lower Extremity Assessment Lower Extremity Assessment: Overall WFL for tasks assessed    Cervical / Trunk Assessment Cervical / Trunk Assessment: Normal  Communication   Communication: No difficulties  Cognition Arousal/Alertness: Awake/alert Behavior During Therapy: WFL for tasks assessed/performed Overall Cognitive Status: Within Functional Limits for tasks assessed  General Comments: Patient very pleasent       General Comments      Exercises     Assessment/Plan    PT Assessment Patient needs continued PT services  PT Problem List Decreased strength;Decreased mobility;Decreased activity tolerance       PT Treatment Interventions Gait training;Functional mobility training;Therapeutic exercise;Therapeutic activities;Patient/family education    PT Goals (Current goals can be found in the Care Plan  section)  Acute Rehab PT Goals Patient Stated Goal: to go home  PT Goal Formulation: With patient Time For Goal Achievement: 01/21/20 Potential to Achieve Goals: Good    Frequency Min 2X/week   Barriers to discharge        Co-evaluation               AM-PAC PT "6 Clicks" Mobility  Outcome Measure Help needed turning from your back to your side while in a flat bed without using bedrails?: None Help needed moving from lying on your back to sitting on the side of a flat bed without using bedrails?: None Help needed moving to and from a bed to a chair (including a wheelchair)?: None Help needed standing up from a chair using your arms (e.g., wheelchair or bedside chair)?: None Help needed to walk in hospital room?: A Little Help needed climbing 3-5 steps with a railing? : A Little 6 Click Score: 22    End of Session Equipment Utilized During Treatment: Gait belt Activity Tolerance: Patient tolerated treatment well Patient left: in chair;with call bell/phone within reach Nurse Communication: Mobility status PT Visit Diagnosis: Other abnormalities of gait and mobility (R26.89)    Time: 0215-0236 PT Time Calculation (min) (ACUTE ONLY): 21 min   Charges:   PT Evaluation $PT Eval Moderate Complexity: 1 Mod          Dessie Coma PT DPT  01/14/2020, 2:53 PM

## 2020-01-14 NOTE — Progress Notes (Signed)
Pt daughter requested to be called by Clinical research associate. Attempted multiple times to call Daughter [name] [phone]. Unable to do so. Will pass on to day shift nurse

## 2020-01-14 NOTE — Progress Notes (Signed)
PROGRESS NOTE    Anna Mcguire  STM:[id number] DOB: 1956/06/15 DOA: 01/12/2020 PCP: [name], MD     Brief Narrative:  64 y.o. BF PMHx HTN, HLD, COPD, EtOH abuse, tobacco abuse,   Presents to the ED for evaluation of shortness of breath.  Patient states she has been having 1 to 2 weeks of sore throat, dry cough, and occasional shortness of breath.  She has been treated by her primary care for suspected COPD with antibiotics and bronchodilators.  She also reports 1 month of frequent loose stools with last bowel movement morning of 01/12/2020.  She has not seen any obvious bleeding.  She denies any subjective fevers or diaphoresis but has had some chills.  She denies any chest pain, abdominal pain, dysuria, or peripheral edema.  She is a current smoker, reports smoking 1 pack/day for 20 years.  She reports chronic daily alcohol use consisting of two-three 40 ounce beers per day.  She denies any cocaine or IV drug use.  ED Course:  Initial vitals showed BP 120/72, pulse 84, RR 17, temp 98.3 Fahrenheit, SPO2 98% on room air.  Labs are notable for potassium 2.7, magnesium 1.8, sodium 124, bicarb 24, BUN 9, creatinine 0.96, serum glucose 79, WBC 2.7, hemoglobin 10.3, platelets 274,000, high-sensitivity troponin I 7.  Urinalysis is negative for UTI.  POC SARS-CoV-2 antigen test is negative.  Portable chest x-ray is negative for focal consolidation, edema, or effusion.  Patient was given 2 L normal saline, DuoNeb treatment, IV magnesium 2 g, IV Solu-Medrol 125 mg, IV K 10 mEq x 2 and oral K 40 mEq x 1.  The hospitalist service was consulted to admit for further evaluation and management.   Subjective: 7/10 afebrile overnight A/O x4, negative abdominal pain, negative CP, negative S OB.  Negative N/V, still having a little bit of diarrhea but significantly improved.   Assessment & Plan: Covid vaccination; positive vaccination   Principal Problem:   Hypokalemia Active  Problems:   Tobacco use   Hyponatremia   Hypertension   COPD with acute exacerbation (HCC)   Alcohol use   Alcohol abuse   Tobacco abuse   Chronic diarrhea   Hypophosphatemia    Hyponatremia: -Sodium 124 on admission.  Also likely multifactorial from GI losses, HCTZ, and alcohol use. -S/p 2 L normal saline bolus in the ED -Serum osmolality, urine osmolality, urine sodium pending  -Normal saline 2ml/hr -Hold HCTZ Recent Labs  Lab 01/12/20 1700 01/13/20 0210 01/13/20 1747 01/14/20 0458  NA 124* 129* 128* 132*  -Improved with hydration  COPD with acute exacerbation: -Treated for presumed COPD exacerbation on ED arrival with significant improvement after receiving IV Solu-Medrol and DuoNeb treatment.   -Titrate O2 to maintain SPO2 > 88% -Continue as needed duo nebs and albuterol  Hypertension: -Amlodipine 10 mg daily -.Lisinopril 2.5 mg daily  Hyperlipidemia: -Lipid panel pending -Lipitor 80 mg daily.  Tobacco use: -Smoking 1 pack/day for last 20 years.  She is advised on smoking cessation.   -Nicotine patch ordered.  EtOH abuse -Reports drinking 2-3 forty ounce beers daily.  She is advised on alcohol cessation.   -CIWA protocol -7/9 urine rapid drug screen pending  Hypokalemia -K 2.7 on admission with magnesium 1.8.  Likely multifactorial from GI losses and HCTZ use.  IV repletion initiated in the ED, Hold HCTZ. -7/10 K. Dur 40 mEq x 2  Hypomagnesmia -Magnesium IV 2 g  Hypophosphatemia -Resolved, continue to monitor  Diarrhea x2 months (chronic) -Most likely secondary  to her alcoholism however for completeness will obtain stool sample for PCR -Full liquid diet -GI panel pending -Lactoferrin pending  Goals of care -7/10 PT/OT consult; evaluate patient for SNF vs CIR vs home health -7/10 LCSW consult; alcohol abuse, tobacco abuse   DVT prophylaxis: Lovenox Code Status: Full Family Communication:  Status is: Inpatient    Dispo: The  patient is from: Home              Anticipated d/c is to: Home              Anticipated d/c date is: 7/14              Patient currently unstable      Consultants:    Procedures/Significant Events:    I have personally reviewed and interpreted all radiology studies and my findings are as above.  VENTILATOR SETTINGS:    Cultures 7/8 SARS coronavirus negative 7/8 group A strep by PCR negative 7/9 GI panel by PCR pending  Antimicrobials:    Devices    LINES / TUBES:      Continuous Infusions: . sodium chloride 75 mL/hr at 01/14/20 0415     Objective: Vitals:   01/13/20 1437 01/13/20 1923 01/14/20 0344 01/14/20 0755  BP: (!) 141/79 (!) 148/81 (!) 146/79 (!) 158/86  Pulse: 96 96 84 83  Resp: 16 17 17 20   Temp: 99.1 F (37.3 C) 98.4 F (36.9 C) 98.4 F (36.9 C) 98.2 F (36.8 C)  TempSrc: Oral Oral Oral Oral  SpO2: 100% 100% 100% 100%  Weight:      Height:        Intake/Output Summary (Last 24 hours) at 01/14/2020 1000 Last data filed at 01/14/2020 0830 Gross per 24 hour  Intake 600 ml  Output --  Net 600 ml   Filed Weights   01/12/20 1307  Weight: 59 kg   Physical Exam:  General: A/O x4, No acute respiratory distress Eyes: negative scleral hemorrhage, negative anisocoria, negative icterus ENT: Negative Runny nose, negative gingival bleeding, Neck:  Negative scars, masses, torticollis, lymphadenopathy, JVD Lungs: Clear to auscultation bilaterally without wheezes or crackles Cardiovascular: Regular rate and rhythm without murmur gallop or rub normal S1 and S2 Abdomen: negative abdominal pain, nondistended, positive soft, bowel sounds, no rebound, no ascites, no appreciable mass Extremities: No significant cyanosis, clubbing, or edema bilateral lower extremities Skin: Negative rashes, lesions, ulcers Psychiatric:  Negative depression, negative anxiety, negative fatigue, negative mania  Central nervous system:  Cranial nerves II through XII  intact, tongue/uvula midline, all extremities muscle strength 5/5, sensation intact throughout, negative dysarthria, negative expressive aphasia, negative receptive aphasia.  .     Data Reviewed: Care during the described time interval was provided by me .  I have reviewed this patient's available data, including medical history, events of note, physical examination, and all test results as part of my evaluation.  CBC: Recent Labs  Lab 01/12/20 1700 01/13/20 0210 01/13/20 1747 01/14/20 0458  WBC 2.7* 2.4* 7.2 5.3  NEUTROABS 1.2*  --   --  3.5  HGB 10.3* 10.1* 9.6* 8.7*  HCT 28.0* 28.5* 27.9* 26.0*  MCV 89.2 90.2 91.5 93.9  PLT 274 267 274 247   Basic Metabolic Panel: Recent Labs  Lab 01/12/20 1700 01/12/20 1844 01/13/20 0210 01/13/20 1747 01/14/20 0458  NA 124*  --  129* 128* 132*  K 2.7*  --  3.3* 3.8 3.0*  CL 86*  --  96* 95* 98  CO2 24  --  22 22 25   GLUCOSE 79  --  190* 115* 97  BUN 9  --  7* 9 7*  CREATININE 0.96  --  0.93 1.12* 0.97  CALCIUM 9.6  --  9.3 9.7 9.4  MG  --  1.8 2.1 2.1 1.7  PHOS  --   --   --  <1.0* 3.9   GFR: Estimated Creatinine Clearance: 53.1 mL/min (by C-G formula based on SCr of 0.97 mg/dL). Liver Function Tests: Recent Labs  Lab 01/13/20 1747 01/14/20 0458  AST 39 38  ALT 24 20  ALKPHOS 85 75  BILITOT 0.8 0.9  PROT 5.6* 5.0*  ALBUMIN 3.6 3.2*   No results for input(s): LIPASE, AMYLASE in the last 168 hours. No results for input(s): AMMONIA in the last 168 hours. Coagulation Profile: No results for input(s): INR, PROTIME in the last 168 hours. Cardiac Enzymes: No results for input(s): CKTOTAL, CKMB, CKMBINDEX, TROPONINI in the last 168 hours. BNP (last 3 results) No results for input(s): PROBNP in the last 8760 hours. HbA1C: No results for input(s): HGBA1C in the last 72 hours. CBG: No results for input(s): GLUCAP in the last 168 hours. Lipid Profile: No results for input(s): CHOL, HDL, LDLCALC, TRIG, CHOLHDL, LDLDIRECT in  the last 72 hours. Thyroid Function Tests: No results for input(s): TSH, T4TOTAL, FREET4, T3FREE, THYROIDAB in the last 72 hours. Anemia Panel: No results for input(s): VITAMINB12, FOLATE, FERRITIN, TIBC, IRON, RETICCTPCT in the last 72 hours. Sepsis Labs: No results for input(s): PROCALCITON, LATICACIDVEN in the last 168 hours.  Recent Results (from the past 240 hour(s))  Group A Strep by PCR     Status: None   Collection Time: 01/12/20  1:08 PM   Specimen: Throat; Sterile Swab  Result Value Ref Range Status   Group A Strep by PCR NOT DETECTED NOT DETECTED Final    Comment: Performed at [facility], [address] [place], [place]         Radiology Studies: DG Chest Portable 1 View  Result Date: 01/12/2020 CLINICAL DATA:  64 year old female with shortness of breath. EXAM: PORTABLE CHEST 1 VIEW COMPARISON:  Chest radiograph dated 11/23/2019. FINDINGS: No focal consolidation, pleural effusion, pneumothorax. The cardiac silhouette is within limits. No acute osseous pathology. IMPRESSION: No active disease. Electronically Signed   By: 11/25/2019 M.D.   On: 01/12/2020 17:28        Scheduled Meds: . amLODipine  10 mg Oral Daily  . atorvastatin  80 mg Oral q1800  . enoxaparin (LOVENOX) injection  40 mg Subcutaneous QHS  . folic acid  1 mg Oral Daily  . LORazepam  0-4 mg Intravenous Q4H   Followed by  . [START ON 01/15/2020] LORazepam  0-4 mg Intravenous Q8H  . multivitamin with minerals  1 tablet Oral Daily  . nicotine  21 mg Transdermal Daily  . sodium chloride flush  3 mL Intravenous Q12H  . thiamine  100 mg Oral Daily   Or  . thiamine  100 mg Intravenous Daily  . thiamine  100 mg Oral Daily   Continuous Infusions: . sodium chloride 75 mL/hr at 01/14/20 0415     LOS: 1 day    Time spent:40 min    Leatta Alewine, 03/16/20, MD [facility] Pager [phone]  If 7PM-7AM, please contact night-coverage [email or web address] Password [email or web address] 01/14/2020,  10:00 AM

## 2020-01-15 LAB — LIPID PANEL
Cholesterol: 159 mg/dL (ref 0–200)
HDL: 103 mg/dL (ref >40)
LDL Cholesterol: 33 mg/dL (ref 0–99)
Total CHOL/HDL Ratio: 1.5 RATIO
Triglycerides: 117 mg/dL (ref <150)
VLDL: 23 mg/dL (ref 0–40)

## 2020-01-15 LAB — COMPREHENSIVE METABOLIC PANEL
ALT: 19 U/L (ref 0–44)
AST: 30 U/L (ref 15–41)
Albumin: 3 g/dL — ABNORMAL LOW (ref 3.5–5.0)
Alkaline Phosphatase: 64 U/L (ref 38–126)
Anion gap: 6 (ref 5–15)
BUN: <5 mg/dL — ABNORMAL LOW (ref 8–23)
CO2: 24 mmol/L (ref 22–32)
Calcium: 9.6 mg/dL (ref 8.9–10.3)
Chloride: 100 mmol/L (ref 98–111)
Creatinine, Ser: 0.83 mg/dL (ref 0.44–1.00)
GFR calc Af Amer: >60 mL/min (ref >60)
GFR calc non Af Amer: >60 mL/min (ref >60)
Glucose, Bld: 87 mg/dL (ref 70–99)
Potassium: 3.8 mmol/L (ref 3.5–5.1)
Sodium: 130 mmol/L — ABNORMAL LOW (ref 135–145)
Total Bilirubin: 0.3 mg/dL (ref 0.3–1.2)
Total Protein: 4.7 g/dL — ABNORMAL LOW (ref 6.5–8.1)

## 2020-01-15 LAB — CBC WITH DIFFERENTIAL/PLATELET
Abs Immature Granulocytes: 0.04 10*3/uL (ref 0.00–0.07)
Basophils Absolute: 0.1 10*3/uL (ref 0.0–0.1)
Basophils Relative: 1 %
Eosinophils Absolute: 0.1 10*3/uL (ref 0.0–0.5)
Eosinophils Relative: 1 %
HCT: 26.3 % — ABNORMAL LOW (ref 36.0–46.0)
Hemoglobin: 8.9 g/dL — ABNORMAL LOW (ref 12.0–15.0)
Immature Granulocytes: 1 %
Lymphocytes Relative: 18 %
Lymphs Abs: 1.2 10*3/uL (ref 0.7–4.0)
MCH: 32.1 pg (ref 26.0–34.0)
MCHC: 33.8 g/dL (ref 30.0–36.0)
MCV: 94.9 fL (ref 80.0–100.0)
Monocytes Absolute: 0.5 10*3/uL (ref 0.1–1.0)
Monocytes Relative: 7 %
Neutro Abs: 4.6 10*3/uL (ref 1.7–7.7)
Neutrophils Relative %: 72 %
Platelets: 264 10*3/uL (ref 150–400)
RBC: 2.77 MIL/uL — ABNORMAL LOW (ref 3.87–5.11)
RDW: 12.8 % (ref 11.5–15.5)
WBC: 6.4 10*3/uL (ref 4.0–10.5)
nRBC: 0 % (ref 0.0–0.2)

## 2020-01-15 LAB — MAGNESIUM: Magnesium: 2.1 mg/dL (ref 1.7–2.4)

## 2020-01-15 LAB — PHOSPHORUS: Phosphorus: 2.2 mg/dL — ABNORMAL LOW (ref 2.5–4.6)

## 2020-01-15 MED ORDER — SODIUM CHLORIDE 1 G PO TABS
1.0000 g | ORAL_TABLET | Freq: Three times a day (TID) | ORAL | Status: DC
  Administered 2020-01-15 – 2020-01-16 (×3): 1 g via ORAL
  Filled 2020-01-15 (×4): qty 1

## 2020-01-15 MED ORDER — LISINOPRIL 5 MG PO TABS
2.5000 mg | ORAL_TABLET | Freq: Once | ORAL | Status: AC
  Administered 2020-01-15: 2.5 mg via ORAL
  Filled 2020-01-15: qty 1

## 2020-01-15 MED ORDER — PANTOPRAZOLE SODIUM 20 MG PO TBEC
20.0000 mg | DELAYED_RELEASE_TABLET | Freq: Every day | ORAL | Status: DC
  Administered 2020-01-15 – 2020-01-17 (×3): 20 mg via ORAL
  Filled 2020-01-15 (×3): qty 1

## 2020-01-15 MED ORDER — SODIUM CHLORIDE 0.9 % IV SOLN
2.0000 g | Freq: Once | INTRAVENOUS | Status: AC
  Administered 2020-01-15: 2 g via INTRAVENOUS
  Filled 2020-01-15: qty 20

## 2020-01-15 MED ORDER — LISINOPRIL 5 MG PO TABS
5.0000 mg | ORAL_TABLET | Freq: Every day | ORAL | Status: DC
  Administered 2020-01-16 – 2020-01-17 (×2): 5 mg via ORAL
  Filled 2020-01-15 (×2): qty 1

## 2020-01-15 NOTE — Progress Notes (Signed)
Occupational Therapy Evaluation and Discharge Patient Details Name: Anna Mcguire MRN: [id number] DOB: 1955-07-22 Today's Date: 01/15/2020    History of Present Illness Patient was admitted to the hospital with an exacerbation of COPD and presented with a sore throat. She has soreness in bilateral hips following a fall. PMH: arthritis, HTN, Acid reflux    Clinical Impression   Pt admitted for exacerbation of COPD and s/p mechanical fall. Pt is limited by decreased activity tolerance and endurance, requiring supervision for more complex ADLs. Pt reports that before hospitalization, pt was Mod I with ADLs and functional mobility/transfers primarily using [facility] for support. Pt reports that daughter currently lives with her and is available 24/7 to supervise/assist as needed. Pt reports functioning at baseline, requiring extended time secondary to arthritis and COPD.   Pt received sitting EOB, agreeable to OT eval. Pt c/o 8/10 pain "everywhere" secondary to arthritis. Pt is Mod I for functional transfers and supervision for ambulating to/from bathroom using SPC. Pt able to perform self-care tasks standing at sink and sitting in chair with Supervision. Educated pt on energy conservation techniques and fall prevention techniques in response to fall hx, pt receptive. Since pt is performing at baseline with ADL routine and functional mobility, pt does not require additional skilled acute OT services at this time. If pt's functional status changes, please re-consult. OT signing off. Thank you.     Follow Up Recommendations  No OT follow up;Supervision - Intermittent (for safety purposes)    Equipment Recommendations  None recommended by OT    Recommendations for Other Services  None.     Precautions / Restrictions Precautions Precautions: None Restrictions Weight Bearing Restrictions: No      Mobility Bed Mobility Overal bed mobility: Modified Independent             General bed  mobility comments: Mod I using bed railings for support  Transfers Overall transfer level: Modified independent Equipment used: Straight cane             General transfer comment: stood without a device     Balance Overall balance assessment: Needs assistance Sitting-balance support: No upper extremity supported;Feet unsupported Sitting balance-Leahy Scale: Good     Standing balance support: Single extremity supported;During functional activity Standing balance-Leahy Scale: Good       ADL either performed or assessed with clinical judgement   ADL Overall ADL's : At baseline            General ADL Comments: pt Mod I-Supervision for self-care tasks using cane for support     Vision Baseline Vision/History: Wears glasses Wears Glasses: At all times              Pertinent Vitals/Pain Pain Assessment: 0-10 Pain Score: 8  Faces Pain Scale: Hurts a little bit Pain Location: pt reports pain "everywhere" Pain Descriptors / Indicators: Aching;Discomfort;Guarding;Grimacing Pain Intervention(s): Limited activity within patient's tolerance;Monitored during session;Repositioned     Hand Dominance Right   Extremity/Trunk Assessment Upper Extremity Assessment Upper Extremity Assessment: Overall WFL for tasks assessed   Lower Extremity Assessment Lower Extremity Assessment: Defer to PT evaluation   Cervical / Trunk Assessment Cervical / Trunk Assessment: Normal   Communication Communication Communication: No difficulties   Cognition Arousal/Alertness: Awake/alert Behavior During Therapy: WFL for tasks assessed/performed Overall Cognitive Status: Within Functional Limits for tasks assessed           General Comments: Patient very pleasent    General Comments  pt reports being back to  baseline and confirmed having daughter home with her 24/7 for the time being      Home Living Family/patient expects to be discharged to:: Private residence Living  Arrangements: Children Available Help at Discharge: Family (daughter) Type of Home: Apartment Home Access: Level entry     Home Layout: One level     Bathroom Shower/Tub: Producer, television/film/video: Standard Bathroom Accessibility: Yes How Accessible: Accessible via walker Home Equipment: Walker - 2 wheels;Cane - single point;Shower seat - built in;Grab bars - tub/shower;Hand held shower head   Additional Comments: Daughter currently living with pt; available to assist 24/7      Prior Functioning/Environment Level of Independence: Independent with assistive device(s)        Comments: used a cane but patient reports she was active; pt was Mod I for self-care and required PRN assistance from daughter with cooking/cleaning; daughter grocery shops         OT Problem List: Pain;Decreased activity tolerance      OT Treatment/Interventions:  Self-care retraining and education: Pt donned/doffed bilateral socks independently sitting EOB. Pt donned/doffed hospital gown sitting in chair with Supervision. Pt transferred on/off toilet with Supervision using SPC. Pt stood at sink to wash hands with Supervision. Pt ambulated to/from bathroom with Supervision using SPC. Educated pt on energy conservation techniques- resting, prioritizing, planning. Educated pt on fall prevention techniques- reducing clutter, removing throw rugs, turning on lights, resting on safe surfaces.   OT Goals(Current goals can be found in the care plan section) Acute Rehab OT Goals Patient Stated Goal: to go home  OT Goal Formulation: With patient Time For Goal Achievement:  (NA) Potential to Achieve Goals:  (NA)   OT Frequency:    OT eval/tx only   AM-PAC OT "6 Clicks" Daily Activity     Outcome Measure Help from another person eating meals?: None Help from another person taking care of personal grooming?: None Help from another person toileting, which includes using toliet, bedpan, or urinal?:  None Help from another person bathing (including washing, rinsing, drying)?: A Little Help from another person to put on and taking off regular upper body clothing?: None Help from another person to put on and taking off regular lower body clothing?: A Little 6 Click Score: 22   End of Session Equipment Utilized During Treatment: Gait belt;Other (comment) [facility]) Nurse Communication: Mobility status  Activity Tolerance: Patient tolerated treatment well Patient left: in chair;with call bell/phone within reach  OT Visit Diagnosis: Repeated falls (R29.6);Pain Pain - Right/Left:  (both) Pain - part of body:  ("everywhere")                Time: 2563-8937 OT Time Calculation (min): 39 min Charges:  OT General Charges $OT Visit: 1 Visit OT Evaluation $OT Eval Low Complexity: 1 Low OT Treatments $Self Care/Home Management : 8-22 mins $Therapeutic Activity: 8-22 mins  Norris Cross, OTR/L [facility] [phone]   Mechele Claude 01/15/2020, 1:07 PM

## 2020-01-15 NOTE — Progress Notes (Signed)
PROGRESS NOTE    Anna Mcguire  PPJ:[id number] DOB: [birth year] DOA: 01/12/2020 PCP: [name], MD     Brief Narrative:  64 y.o. BF PMHx HTN, HLD, COPD, EtOH abuse, tobacco abuse,   Presents to the ED for evaluation of shortness of breath.  Patient states she has been having 1 to 2 weeks of sore throat, dry cough, and occasional shortness of breath.  She has been treated by her primary care for suspected COPD with antibiotics and bronchodilators.  She also reports 1 month of frequent loose stools with last bowel movement morning of 01/12/2020.  She has not seen any obvious bleeding.  She denies any subjective fevers or diaphoresis but has had some chills.  She denies any chest pain, abdominal pain, dysuria, or peripheral edema.  She is a current smoker, reports smoking 1 pack/day for 20 years.  She reports chronic daily alcohol use consisting of two-three 40 ounce beers per day.  She denies any cocaine or IV drug use.  ED Course:  Initial vitals showed BP 120/72, pulse 84, RR 17, temp 98.3 Fahrenheit, SPO2 98% on room air.  Labs are notable for potassium 2.7, magnesium 1.8, sodium 124, bicarb 24, BUN 9, creatinine 0.96, serum glucose 79, WBC 2.7, hemoglobin 10.3, platelets 274,000, high-sensitivity troponin I 7.  Urinalysis is negative for UTI.  POC SARS-CoV-2 antigen test is negative.  Portable chest x-ray is negative for focal consolidation, edema, or effusion.  Patient was given 2 L normal saline, DuoNeb treatment, IV magnesium 2 g, IV Solu-Medrol 125 mg, IV K 10 mEq x 2 and oral K 40 mEq x 1.  The hospitalist service was consulted to admit for further evaluation and management.   Subjective: 7/11 A/O x4, positive mild abdominal pain, negative CP, negative S OB.  Positive continued diarrhea but states a little bit more solid.  Positive nausea without vomiting.   Assessment & Plan: Covid vaccination; positive vaccination   Principal Problem:   Hypokalemia Active  Problems:   Tobacco use   Hyponatremia   Hypertension   COPD with acute exacerbation (HCC)   Alcohol use   Alcohol abuse   Tobacco abuse   Chronic diarrhea   Hypophosphatemia    Hyponatremia: -Sodium 124 on admission.  Also likely multifactorial from GI losses, HCTZ, and alcohol use. -S/p 2 L normal saline bolus in the ED -Serum osmolality, urine osmolality, urine sodium pending  -Normal saline 15ml/hr -Hold HCTZ Recent Labs  Lab 01/12/20 1700 01/13/20 0210 01/13/20 1747 01/14/20 0458 01/15/20 0258  NA 124* 129* 128* 132* 130*  -Improved with hydration -7/11 sodium chloride tablet 1 g TID  COPD with acute exacerbation: -Treated for presumed COPD exacerbation on ED arrival with significant improvement after receiving IV Solu-Medrol and DuoNeb treatment.   -Titrate O2 to maintain SPO2 > 88% -Continue as needed duo nebs and albuterol  Hypertension: -Amlodipine 10 mg daily -.7/11 increase Lisinopril 5 mg daily  Hyperlipidemia: -7/11 LDL= 33 -Lipitor 80 mg daily.  Tobacco use: -Smoking 1 pack/day for last 20 years.  She is advised on smoking cessation.   -Nicotine patch ordered.  EtOH abuse -Reports drinking 2-3 forty ounce beers daily.  She is advised on alcohol cessation.   -CIWA protocol  Drug abuse -7/10 urine rapid drug screen positive THC  Hypokalemia -K 2.7 on admission with magnesium 1.8.  Likely multifactorial from GI losses and HCTZ use.  IV repletion initiated in the ED, Hold HCTZ. -7/10 K. Dur 40 mEq x 2 -Resolved  Hypomagnesmia -Magnesium IV 2 g  Hypophosphatemia -Resolved, continue to monitor  Diarrhea x2 months (chronic) -Most likely secondary to her alcoholism however for completeness will obtain stool sample for PCR -Full liquid diet -GI panel  positive Enteroaggregative E. coli -Discussed case with Dr. Staci Righter, ID recommended ceftriaxone IV 2 g x 1 -Pantoprazole 20 mg daily   Goals of care -7/10 PT/OT consult; evaluate  patient for SNF vs CIR vs home health -7/10 LCSW consult; alcohol abuse, tobacco abuse   DVT prophylaxis: Lovenox Code Status: Full Family Communication:  Status is: Inpatient    Dispo: The patient is from: Home              Anticipated d/c is to: Home              Anticipated d/c date is: 7/14              Patient currently unstable      Consultants:    Procedures/Significant Events:    I have personally reviewed and interpreted all radiology studies and my findings are as above.  VENTILATOR SETTINGS:    Cultures 7/8 SARS coronavirus negative 7/8 group A strep by PCR negative 7/9 GI panel by PCR positive Enteroaggregative E. coli  Antimicrobials: Anti-infectives (From admission, onward)   Start     Ordered Stop   01/15/20 1515  cefTRIAXone (ROCEPHIN) 2 g in sodium chloride 0.9 % 100 mL IVPB     Discontinue     01/15/20 1507         Devices    LINES / TUBES:      Continuous Infusions: . sodium chloride Stopped (01/14/20 2237)     Objective: Vitals:   01/14/20 1607 01/14/20 1920 01/15/20 0348 01/15/20 0734  BP: (!) 141/81 (!) 147/91 130/78 (!) 156/85  Pulse: 84 76 77 82  Resp: 18 18 17 17   Temp: 98.5 F (36.9 C) 98.1 F (36.7 C) 98.3 F (36.8 C) 99 F (37.2 C)  TempSrc: Oral Oral Oral Oral  SpO2: 100% 100% 100% 100%  Weight:      Height:        Intake/Output Summary (Last 24 hours) at 01/15/2020 1259 Last data filed at 01/15/2020 0400 Gross per 24 hour  Intake 2063.98 ml  Output --  Net 2063.98 ml   Filed Weights   01/12/20 1307  Weight: 59 kg   Physical Exam:  General: A/O x4, No acute respiratory distress, cachectic Eyes: negative scleral hemorrhage, negative anisocoria, negative icterus ENT: Negative Runny nose, negative gingival bleeding, Neck:  Negative scars, masses, torticollis, lymphadenopathy, JVD Lungs: Clear to auscultation bilaterally without wheezes or crackles Cardiovascular: Regular rate and rhythm without  murmur gallop or rub normal S1 and S2 Abdomen: Positive abdominal pain, nondistended, positive soft, bowel sounds, no rebound, no ascites, no appreciable mass Extremities: No significant cyanosis, clubbing, or edema bilateral lower extremities Skin: Negative rashes, lesions, ulcers Psychiatric:  Negative depression, negative anxiety, negative fatigue, negative mania  Central nervous system:  Cranial nerves II through XII intact, tongue/uvula midline, all extremities muscle strength 5/5, sensation intact throughout, negative dysarthria, negative expressive aphasia, negative receptive aphasia.   .     Data Reviewed: Care during the described time interval was provided by me .  I have reviewed this patient's available data, including medical history, events of note, physical examination, and all test results as part of my evaluation.  CBC: Recent Labs  Lab 01/12/20 1700 01/13/20 0210 01/13/20 1747 01/14/20 0458 01/15/20 0258  WBC 2.7* 2.4* 7.2 5.3 6.4  NEUTROABS 1.2*  --   --  3.5 4.6  HGB 10.3* 10.1* 9.6* 8.7* 8.9*  HCT 28.0* 28.5* 27.9* 26.0* 26.3*  MCV 89.2 90.2 91.5 93.9 94.9  PLT 274 267 274 247 264   Basic Metabolic Panel: Recent Labs  Lab 01/12/20 1700 01/12/20 1844 01/13/20 0210 01/13/20 1747 01/14/20 0458 01/15/20 0258  NA 124*  --  129* 128* 132* 130*  K 2.7*  --  3.3* 3.8 3.0* 3.8  CL 86*  --  96* 95* 98 100  CO2 24  --  22 22 25 24   GLUCOSE 79  --  190* 115* 97 87  BUN 9  --  7* 9 7* <5*  CREATININE 0.96  --  0.93 1.12* 0.97 0.83  CALCIUM 9.6  --  9.3 9.7 9.4 9.6  MG  --  1.8 2.1 2.1 1.7 2.1  PHOS  --   --   --  <1.0* 3.9 2.2*   GFR: Estimated Creatinine Clearance: 62.1 mL/min (by C-G formula based on SCr of 0.83 mg/dL). Liver Function Tests: Recent Labs  Lab 01/13/20 1747 01/14/20 0458 01/15/20 0258  AST 39 38 30  ALT 24 20 19   ALKPHOS 85 75 64  BILITOT 0.8 0.9 0.3  PROT 5.6* 5.0* 4.7*  ALBUMIN 3.6 3.2* 3.0*   No results for input(s): LIPASE,  AMYLASE in the last 168 hours. No results for input(s): AMMONIA in the last 168 hours. Coagulation Profile: No results for input(s): INR, PROTIME in the last 168 hours. Cardiac Enzymes: No results for input(s): CKTOTAL, CKMB, CKMBINDEX, TROPONINI in the last 168 hours. BNP (last 3 results) No results for input(s): PROBNP in the last 8760 hours. HbA1C: No results for input(s): HGBA1C in the last 72 hours. CBG: No results for input(s): GLUCAP in the last 168 hours. Lipid Profile: Recent Labs    01/15/20 0258  CHOL 159  HDL 103  LDLCALC 33  TRIG [id number]  CHOLHDL 1.5   Thyroid Function Tests: No results for input(s): TSH, T4TOTAL, FREET4, T3FREE, THYROIDAB in the last 72 hours. Anemia Panel: No results for input(s): VITAMINB12, FOLATE, FERRITIN, TIBC, IRON, RETICCTPCT in the last 72 hours. Sepsis Labs: No results for input(s): PROCALCITON, LATICACIDVEN in the last 168 hours.  Recent Results (from the past 240 hour(s))  Group A Strep by PCR     Status: None   Collection Time: 01/12/20  1:08 PM   Specimen: Throat; Sterile Swab  Result Value Ref Range Status   Group A Strep by PCR NOT DETECTED NOT DETECTED Final    Comment: Performed at [facility], [address] [place], [place] [address]  Gastrointestinal Panel by PCR , Stool     Status: Abnormal   Collection Time: 01/14/20  1:40 PM   Specimen: Urine, Random; Stool  Result Value Ref Range Status   Campylobacter species NOT DETECTED NOT DETECTED Final   Plesimonas shigelloides NOT DETECTED NOT DETECTED Final   Salmonella species NOT DETECTED NOT DETECTED Final   Yersinia enterocolitica NOT DETECTED NOT DETECTED Final   Vibrio species NOT DETECTED NOT DETECTED Final   Vibrio cholerae NOT DETECTED NOT DETECTED Final   Enteroaggregative E coli (EAEC) DETECTED (A) NOT DETECTED Final    Comment: RESULT CALLED TO, READ BACK BY AND VERIFIED WITH: DAVID RHINEHART @2008  01/14/20 MJU    Enteropathogenic E coli (EPEC) NOT  DETECTED NOT DETECTED Final   Enterotoxigenic E coli (ETEC) NOT DETECTED NOT DETECTED Final  Shiga like toxin producing E coli (STEC) NOT DETECTED NOT DETECTED Final   Shigella/Enteroinvasive E coli (EIEC) NOT DETECTED NOT DETECTED Final   Cryptosporidium NOT DETECTED NOT DETECTED Final   Cyclospora cayetanensis NOT DETECTED NOT DETECTED Final   Entamoeba histolytica NOT DETECTED NOT DETECTED Final   Giardia lamblia NOT DETECTED NOT DETECTED Final   Adenovirus F40/41 NOT DETECTED NOT DETECTED Final   Astrovirus NOT DETECTED NOT DETECTED Final   Norovirus GI/GII NOT DETECTED NOT DETECTED Final   Rotavirus A NOT DETECTED NOT DETECTED Final   Sapovirus (I, II, IV, and V) NOT DETECTED NOT DETECTED Final    Comment: Performed at [facility], [address], [place], [place] [address]         Radiology Studies: No results found.      Scheduled Meds: . amLODipine  10 mg Oral Daily  . atorvastatin  80 mg Oral q1800  . enoxaparin (LOVENOX) injection  40 mg Subcutaneous QHS  . folic acid  1 mg Oral Daily  . lisinopril  2.5 mg Oral Daily  . LORazepam  0-4 mg Intravenous Q4H   Followed by  . LORazepam  0-4 mg Intravenous Q8H  . multivitamin with minerals  1 tablet Oral Daily  . nicotine  21 mg Transdermal Daily  . sodium chloride flush  3 mL Intravenous Q12H  . thiamine  100 mg Oral Daily   Or  . thiamine  100 mg Intravenous Daily  . thiamine  100 mg Oral Daily   Continuous Infusions: . sodium chloride Stopped (01/14/20 2237)     LOS: 2 days    Time spent:40 min    Jamill Wetmore, Roselind Messier, MD [facility] Pager [phone]  If 7PM-7AM, please contact night-coverage [email or web address] Password [facility] 01/15/2020, 12:59 PM

## 2020-01-16 DIAGNOSIS — W19XXXA Unspecified fall, initial encounter: Secondary | ICD-10-CM

## 2020-01-16 LAB — CBC WITH DIFFERENTIAL/PLATELET
Abs Immature Granulocytes: 0.03 10*3/uL (ref 0.00–0.07)
Basophils Absolute: 0.1 10*3/uL (ref 0.0–0.1)
Basophils Relative: 1 %
Eosinophils Absolute: 0.1 10*3/uL (ref 0.0–0.5)
Eosinophils Relative: 2 %
HCT: 25.7 % — ABNORMAL LOW (ref 36.0–46.0)
Hemoglobin: 8.5 g/dL — ABNORMAL LOW (ref 12.0–15.0)
Immature Granulocytes: 1 %
Lymphocytes Relative: 21 %
Lymphs Abs: 1.3 10*3/uL (ref 0.7–4.0)
MCH: 31.4 pg (ref 26.0–34.0)
MCHC: 33.1 g/dL (ref 30.0–36.0)
MCV: 94.8 fL (ref 80.0–100.0)
Monocytes Absolute: 0.5 10*3/uL (ref 0.1–1.0)
Monocytes Relative: 9 %
Neutro Abs: 4 10*3/uL (ref 1.7–7.7)
Neutrophils Relative %: 66 %
Platelets: 241 10*3/uL (ref 150–400)
RBC: 2.71 MIL/uL — ABNORMAL LOW (ref 3.87–5.11)
RDW: 12.8 % (ref 11.5–15.5)
WBC: 6 10*3/uL (ref 4.0–10.5)
nRBC: 0 % (ref 0.0–0.2)

## 2020-01-16 LAB — COMPREHENSIVE METABOLIC PANEL
ALT: 19 U/L (ref 0–44)
AST: 26 U/L (ref 15–41)
Albumin: 3.4 g/dL — ABNORMAL LOW (ref 3.5–5.0)
Alkaline Phosphatase: 67 U/L (ref 38–126)
Anion gap: 7 (ref 5–15)
BUN: <5 mg/dL — ABNORMAL LOW (ref 8–23)
CO2: 25 mmol/L (ref 22–32)
Calcium: 10.2 mg/dL (ref 8.9–10.3)
Chloride: 98 mmol/L (ref 98–111)
Creatinine, Ser: 0.79 mg/dL (ref 0.44–1.00)
GFR calc Af Amer: >60 mL/min (ref >60)
GFR calc non Af Amer: >60 mL/min (ref >60)
Glucose, Bld: 91 mg/dL (ref 70–99)
Potassium: 3.7 mmol/L (ref 3.5–5.1)
Sodium: 130 mmol/L — ABNORMAL LOW (ref 135–145)
Total Bilirubin: 0.7 mg/dL (ref 0.3–1.2)
Total Protein: 5.1 g/dL — ABNORMAL LOW (ref 6.5–8.1)

## 2020-01-16 LAB — OSMOLALITY, URINE: Osmolality, Ur: 251 mOsm/kg — ABNORMAL LOW (ref 300–900)

## 2020-01-16 LAB — MAGNESIUM: Magnesium: 1.4 mg/dL — ABNORMAL LOW (ref 1.7–2.4)

## 2020-01-16 LAB — PHOSPHORUS: Phosphorus: 2.8 mg/dL (ref 2.5–4.6)

## 2020-01-16 LAB — NA AND K (SODIUM & POTASSIUM), RAND UR
Potassium Urine: 18 mmol/L
Sodium, Ur: 73 mmol/L

## 2020-01-16 LAB — URIC ACID: Uric Acid, Serum: 4.2 mg/dL (ref 2.5–7.1)

## 2020-01-16 MED ORDER — MAGNESIUM SULFATE 50 % IJ SOLN
3.0000 g | Freq: Once | INTRAVENOUS | Status: AC
  Administered 2020-01-16: 3 g via INTRAVENOUS
  Filled 2020-01-16: qty 6

## 2020-01-16 MED ORDER — SODIUM CHLORIDE 1 G PO TABS
2.0000 g | ORAL_TABLET | Freq: Three times a day (TID) | ORAL | Status: DC
  Administered 2020-01-16 – 2020-01-17 (×3): 2 g via ORAL
  Filled 2020-01-16 (×5): qty 2

## 2020-01-16 NOTE — Progress Notes (Signed)
PROGRESS NOTE    Anna SalvoGertrue Savannah Mcguire  ZOX:[id number] DOB: 02/11/1956 DOA: 01/12/2020 PCP: Cain SaupeFulp, Cammie, MD     Brief Narrative:  64 y.o. BF PMHx HTN, HLD, COPD, EtOH abuse, tobacco abuse,   Presents to the ED for evaluation of shortness of breath.  Patient states she has been having 1 to 2 weeks of sore throat, dry cough, and occasional shortness of breath.  She has been treated by her primary care for suspected COPD with antibiotics and bronchodilators.  She also reports 1 month of frequent loose stools with last bowel movement morning of 01/12/2020.  She has not seen any obvious bleeding.  She denies any subjective fevers or diaphoresis but has had some chills.  She denies any chest pain, abdominal pain, dysuria, or peripheral edema.  She is a current smoker, reports smoking 1 pack/day for 20 years.  She reports chronic daily alcohol use consisting of two-three 40 ounce beers per day.  She denies any cocaine or IV drug use.  ED Course:  Initial vitals showed BP 120/72, pulse 84, RR 17, temp 98.3 Fahrenheit, SPO2 98% on room air.  Labs are notable for potassium 2.7, magnesium 1.8, sodium 124, bicarb 24, BUN 9, creatinine 0.96, serum glucose 79, WBC 2.7, hemoglobin 10.3, platelets 274,000, high-sensitivity troponin I 7.  Urinalysis is negative for UTI.  POC SARS-CoV-2 antigen test is negative.  Portable chest x-ray is negative for focal consolidation, edema, or effusion.  Patient was given 2 L normal saline, DuoNeb treatment, IV magnesium 2 g, IV Solu-Medrol 125 mg, IV K 10 mEq x 2 and oral K 40 mEq x 1.  The hospitalist service was consulted to admit for further evaluation and management.   Subjective: 7/12 A/O x4, positive continued mild abdominal pain.  Positive continue mild diarrhea but improving.  Minimal nausea.  Would like to try advancing her diet.    Assessment & Plan: Covid vaccination; positive vaccination   Principal Problem:   Hypokalemia Active Problems:    Tobacco use   Hyponatremia   Hypertension   COPD with acute exacerbation (HCC)   Alcohol use   Alcohol abuse   Tobacco abuse   Chronic diarrhea   Hypophosphatemia    Hyponatremia: -Sodium 124 on admission.  Also likely multifactorial from GI losses, HCTZ, and alcohol use. -S/p 2 L normal saline bolus in the ED -Serum osmolality, urine osmolality, urine sodium pending  -Normal saline 4375ml/hr -Hold HCTZ Recent Labs  Lab 01/13/20 0210 01/13/20 1747 01/14/20 0458 01/15/20 0258 01/16/20 0255  NA 129* 128* 132* 130* 130*  -Improved with hydration -7/12 increase sodium chloride tablet 2g TID  COPD with acute exacerbation: -Treated for presumed COPD exacerbation on ED arrival with significant improvement after receiving IV Solu-Medrol and DuoNeb treatment.   -Titrate O2 to maintain SPO2 > 88% -Continue as needed duo nebs and albuterol SATURATION QUALIFICATIONS: (This note is used to comply with regulatory documentation for home oxygen) Patient Saturations on Room Air at Rest = 100% Patient Saturations on Room Air while Ambulating = 98% -Patient does not meet criteria for home O2  Hypertension: -Amlodipine 10 mg daily -.7/11 increase Lisinopril 5 mg daily  Hyperlipidemia: -7/11 LDL= 33 -Lipitor 80 mg daily.  Tobacco use: -Smoking 1 pack/day for last 20 years.  She is advised on smoking cessation.   -Nicotine patch ordered.  EtOH abuse -Reports drinking 2-3 forty ounce beers daily.  She is advised on alcohol cessation.   -CIWA protocol  Drug abuse -7/10 urine rapid drug  screen positive THC  Hypokalemia -K 2.7 on admission with magnesium 1.8.  Likely multifactorial from GI losses and HCTZ use.  IV repletion initiated in the ED, Hold HCTZ. -7/10 K. Dur 40 mEq x 2 -Resolved  Hypomagnesmia -Magnesium goal> 2 -Magnesium IV 3 g  Hypophosphatemia -Resolved, continue to monitor  Diarrhea x2 months (chronic)  -Most likely secondary to her alcoholism however  for completeness will obtain stool sample for PCR -GI panel  positive Enteroaggregative E. coli -Discussed case with Dr. Staci Righter, ID recommended ceftriaxone IV 2 g x 1 -Pantoprazole 20 mg daily -7/12 advance diet BRAT  ADDENDUM; Fall -Patient had witnessed fall where she fell on her buttocks did not strike any furniture, did not strike her head.   Goals of care -7/10 PT/OT consult; evaluate patient for SNF vs CIR vs home health -7/10 LCSW consult; alcohol abuse, tobacco abuse   DVT prophylaxis: Lovenox Code Status: Full Family Communication:  Status is: Inpatient    Dispo: The patient is from: Home              Anticipated d/c is to: Home              Anticipated d/c date is: 7/14              Patient currently unstable      Consultants:    Procedures/Significant Events:    I have personally reviewed and interpreted all radiology studies and my findings are as above.  VENTILATOR SETTINGS:    Cultures 7/8 SARS coronavirus negative 7/8 group A strep by PCR negative 7/9 GI panel by PCR positive Enteroaggregative E. coli   Antimicrobials: Anti-infectives (From admission, onward)   Start     Ordered Stop   01/15/20 1515  cefTRIAXone (ROCEPHIN) 2 g in sodium chloride 0.9 % 100 mL IVPB     Discontinue     01/15/20 1507         Devices    LINES / TUBES:      Continuous Infusions:  sodium chloride Stopped (01/14/20 2237)     Objective: Vitals:   01/15/20 0348 01/15/20 0734 01/15/20 1936 01/16/20 0327  BP: 130/78 (!) 156/85 (!) 116/58 109/61  Pulse: 77 82 61 96  Resp: 17 17 19 20   Temp: 98.3 F (36.8 C) 99 F (37.2 C) 98.3 F (36.8 C) 99 F (37.2 C)  TempSrc: Oral Oral Oral Oral  SpO2: 100% 100% 96% 100%  Weight:      Height:        Intake/Output Summary (Last 24 hours) at 01/16/2020 03/18/2020 Last data filed at 01/15/2020 1700 Gross per 24 hour  Intake 100 ml  Output 2 ml  Net 98 ml   Filed Weights   01/12/20 1307  Weight: 59 kg    Physical Exam:  General: A/O x4, No acute respiratory distress, cachectic Eyes: negative scleral hemorrhage, negative anisocoria, negative icterus ENT: Negative Runny nose, negative gingival bleeding, Neck:  Negative scars, masses, torticollis, lymphadenopathy, JVD Lungs: Clear to auscultation bilaterally without wheezes or crackles Cardiovascular: Regular rate and rhythm without murmur gallop or rub normal S1 and S2 Abdomen: negative abdominal pain, nondistended, positive soft, bowel sounds, no rebound, no ascites, no appreciable mass Extremities: No significant cyanosis, clubbing, or edema bilateral lower extremities Skin: Negative rashes, lesions, ulcers Psychiatric:  Negative depression, negative anxiety, negative fatigue, negative mania  Central nervous system:  Cranial nerves II through XII intact, tongue/uvula midline, all extremities muscle strength 5/5, sensation intact throughout, negative  dysarthria, negative expressive aphasia, negative receptive aphasia.   .     Data Reviewed: Care during the described time interval was provided by me .  I have reviewed this patient's available data, including medical history, events of note, physical examination, and all test results as part of my evaluation.  CBC: Recent Labs  Lab 01/12/20 1700 01/12/20 1700 01/13/20 0210 01/13/20 1747 01/14/20 0458 01/15/20 0258 01/16/20 0255  WBC 2.7*   < > 2.4* 7.2 5.3 6.4 6.0  NEUTROABS 1.2*  --   --   --  3.5 4.6 4.0  HGB 10.3*   < > 10.1* 9.6* 8.7* 8.9* 8.5*  HCT 28.0*   < > 28.5* 27.9* 26.0* 26.3* 25.7*  MCV 89.2   < > 90.2 91.5 93.9 94.9 94.8  PLT 274   < > 267 274 247 264 241   < > = values in this interval not displayed.   Basic Metabolic Panel: Recent Labs  Lab 01/13/20 0210 01/13/20 1747 01/14/20 0458 01/15/20 0258 01/16/20 0255  NA 129* 128* 132* 130* 130*  K 3.3* 3.8 3.0* 3.8 3.7  CL 96* 95* 98 100 98  CO2 22 22 25 24 25   GLUCOSE 190* 115* 97 87 91  BUN 7* 9 7* <5*  <5*  CREATININE 0.93 1.12* 0.97 0.83 0.79  CALCIUM 9.3 9.7 9.4 9.6 10.2  MG 2.1 2.1 1.7 2.1 1.4*  PHOS  --  <1.0* 3.9 2.2* 2.8   GFR: Estimated Creatinine Clearance: 64.4 mL/min (by C-G formula based on SCr of 0.79 mg/dL). Liver Function Tests: Recent Labs  Lab 01/13/20 1747 01/14/20 0458 01/15/20 0258 01/16/20 0255  AST 39 38 30 26  ALT 24 20 19 19   ALKPHOS 85 75 64 67  BILITOT 0.8 0.9 0.3 0.7  PROT 5.6* 5.0* 4.7* 5.1*  ALBUMIN 3.6 3.2* 3.0* 3.4*   No results for input(s): LIPASE, AMYLASE in the last 168 hours. No results for input(s): AMMONIA in the last 168 hours. Coagulation Profile: No results for input(s): INR, PROTIME in the last 168 hours. Cardiac Enzymes: No results for input(s): CKTOTAL, CKMB, CKMBINDEX, TROPONINI in the last 168 hours. BNP (last 3 results) No results for input(s): PROBNP in the last 8760 hours. HbA1C: No results for input(s): HGBA1C in the last 72 hours. CBG: No results for input(s): GLUCAP in the last 168 hours. Lipid Profile: Recent Labs    01/15/20 0258  CHOL 159  HDL 103  LDLCALC 33  TRIG  CHOLHDL 1.5   Thyroid Function Tests: No results for input(s): TSH, T4TOTAL, FREET4, T3FREE, THYROIDAB in the last 72 hours. Anemia Panel: No results for input(s): VITAMINB12, FOLATE, FERRITIN, TIBC, IRON, RETICCTPCT in the last 72 hours. Sepsis Labs: No results for input(s): PROCALCITON, LATICACIDVEN in the last 168 hours.  Recent Results (from the past 240 hour(s))  Group A Strep by PCR     Status: None   Collection Time: 01/12/20  1:08 PM   Specimen: Throat; Sterile Swab  Result Value Ref Range Status   Group A Strep by PCR NOT DETECTED NOT DETECTED Final    Comment: Performed at [facility], [address], [place], [address] [place]  Gastrointestinal Panel by PCR , Stool     Status: Abnormal   Collection Time: 01/14/20  1:40 PM   Specimen: Urine, Random; Stool  Result Value Ref Range Status   Campylobacter species NOT  DETECTED NOT DETECTED Final   Plesimonas shigelloides NOT DETECTED NOT DETECTED Final  Salmonella species NOT DETECTED NOT DETECTED Final   Yersinia enterocolitica NOT DETECTED NOT DETECTED Final   Vibrio species NOT DETECTED NOT DETECTED Final   Vibrio cholerae NOT DETECTED NOT DETECTED Final   Enteroaggregative E coli (EAEC) DETECTED (A) NOT DETECTED Final    Comment: RESULT CALLED TO, READ BACK BY AND VERIFIED WITH: DAVID RHINEHART @2008  01/14/20 MJU    Enteropathogenic E coli (EPEC) NOT DETECTED NOT DETECTED Final   Enterotoxigenic E coli (ETEC) NOT DETECTED NOT DETECTED Final   Shiga like toxin producing E coli (STEC) NOT DETECTED NOT DETECTED Final   Shigella/Enteroinvasive E coli (EIEC) NOT DETECTED NOT DETECTED Final   Cryptosporidium NOT DETECTED NOT DETECTED Final   Cyclospora cayetanensis NOT DETECTED NOT DETECTED Final   Entamoeba histolytica NOT DETECTED NOT DETECTED Final   Giardia lamblia NOT DETECTED NOT DETECTED Final   Adenovirus F40/41 NOT DETECTED NOT DETECTED Final   Astrovirus NOT DETECTED NOT DETECTED Final   Norovirus GI/GII NOT DETECTED NOT DETECTED Final   Rotavirus A NOT DETECTED NOT DETECTED Final   Sapovirus (I, II, IV, and V) NOT DETECTED NOT DETECTED Final    Comment: Performed at [facility], [address] [place], [place]         Radiology Studies: No results found.      Scheduled Meds:  amLODipine  10 mg Oral Daily   atorvastatin  80 mg Oral q1800   enoxaparin (LOVENOX) injection  40 mg Subcutaneous QHS   folic acid  1 mg Oral Daily   lisinopril  5 mg Oral Daily   LORazepam  0-4 mg Intravenous Q8H   multivitamin with minerals  1 tablet Oral Daily   nicotine  21 mg Transdermal Daily   pantoprazole  20 mg Oral Daily   sodium chloride flush  3 mL Intravenous Q12H   sodium chloride  1 g Oral TID WC   thiamine  100 mg Oral Daily   Or   thiamine  100 mg Intravenous Daily   thiamine  100 mg Oral Daily     Continuous Infusions:  sodium chloride Stopped (01/14/20 2237)     LOS: 3 days    Time spent:40 min    Herbie Lehrmann, 03/16/20, MD [facility] Pager [phone]  If 7PM-7AM, please contact night-coverage [email or web address] Password [facility] 01/16/2020, 8:35 AM

## 2020-01-16 NOTE — Progress Notes (Signed)
   01/16/20 1428  What Happened  Was fall witnessed? No  Was patient injured? No  Patient found on floor  Found by Staff-comment  Stated prior activity other (comment) (patient sitting on edge of bed pushing bedside table)  Follow Up  MD notified Dr. Joseph Art  Time MD notified 1430  Family notified No - patient refusal  Additional tests No  Simple treatment Other (comment)  Progress note created (see row info) Yes  Adult Fall Risk Assessment  Risk Factor Category (scoring not indicated) High fall risk per protocol (document High fall risk)  Age 64  Fall History: Fall within 6 months prior to admission 5  Elimination; Bowel and/or Urine Incontinence 0  Elimination; Bowel and/or Urine Urgency/Frequency 0  Medications: includes PCA/Opiates, Anti-convulsants, Anti-hypertensives, Diuretics, Hypnotics, Laxatives, Sedatives, and Psychotropics 3  Patient Care Equipment 2  Mobility-Assistance 2  Mobility-Gait 0  Mobility-Sensory Deficit 0  Altered awareness of immediate physical environment 0  Impulsiveness 0  Lack of understanding of one's physical/cognitive limitations 0  Total Score 13  Patient Fall Risk Level High fall risk  Adult Fall Risk Interventions  Required Bundle Interventions *See Row Information* High fall risk - low, moderate, and high requirements implemented  Additional Interventions Use of appropriate toileting equipment (bedpan, BSC, etc.)  Screening for Fall Injury Risk (To be completed on HIGH fall risk patients) - Assessing Need for Low Bed  Risk For Fall Injury- Low Bed Criteria Previous fall this admission  Will Implement Low Bed and Floor Mats Yes  Vitals  Temp 98.3 F (36.8 C)  Temp Source Oral  BP 139/79  MAP (mmHg) 95  BP Location Right Arm  BP Method Automatic  Patient Position (if appropriate) Lying  Pulse Rate 93  Pulse Rate Source Dinamap  Cardiac Rhythm NSR  Resp 18  Oxygen Therapy  SpO2 100 %  O2 Device Room Air  Pain Assessment  Pain Scale  0-10  Pain Score 0  PCA/Epidural/Spinal Assessment  Respiratory Pattern Regular;Unlabored  Neurological  Neuro (WDL) WDL  Glasgow Coma Scale  Eye Opening 4  Best Verbal Response (NON-intubated) 5  Best Motor Response 6  Musculoskeletal  Musculoskeletal (WDL) WDL  Integumentary  Integumentary (WDL) WDL

## 2020-01-16 NOTE — Progress Notes (Signed)
SATURATION QUALIFICATIONS: (This note is used to comply with regulatory documentation for home oxygen)  Patient Saturations on Room Air at Rest = 100%  Patient Saturations on Room Air while Ambulating = 98% 

## 2020-01-16 NOTE — Progress Notes (Addendum)
Patient called out saying she had fallen. Patient found on floor by the side of the bed. She said she was pushing the bedside table out of the way and it just kept going and it made her slip forward. She did not hit her head. Only complaint was she scraped her back on side of bed but there are no visible signs of a cut or injury. Dr. Joseph Art notified and patient declined notifying family. VS stable and patient free from pain.

## 2020-01-17 DIAGNOSIS — Z7289 Other problems related to lifestyle: Secondary | ICD-10-CM

## 2020-01-17 LAB — COMPREHENSIVE METABOLIC PANEL
ALT: 20 U/L (ref 0–44)
AST: 27 U/L (ref 15–41)
Albumin: 3 g/dL — ABNORMAL LOW (ref 3.5–5.0)
Alkaline Phosphatase: 61 U/L (ref 38–126)
Anion gap: 8 (ref 5–15)
BUN: <5 mg/dL — ABNORMAL LOW (ref 8–23)
CO2: 22 mmol/L (ref 22–32)
Calcium: 10 mg/dL (ref 8.9–10.3)
Chloride: 102 mmol/L (ref 98–111)
Creatinine, Ser: 0.78 mg/dL (ref 0.44–1.00)
GFR calc Af Amer: >60 mL/min (ref >60)
GFR calc non Af Amer: >60 mL/min (ref >60)
Glucose, Bld: 91 mg/dL (ref 70–99)
Potassium: 3.2 mmol/L — ABNORMAL LOW (ref 3.5–5.1)
Sodium: 132 mmol/L — ABNORMAL LOW (ref 135–145)
Total Bilirubin: 0.7 mg/dL (ref 0.3–1.2)
Total Protein: 4.7 g/dL — ABNORMAL LOW (ref 6.5–8.1)

## 2020-01-17 LAB — MAGNESIUM: Magnesium: 1.8 mg/dL (ref 1.7–2.4)

## 2020-01-17 LAB — CBC WITH DIFFERENTIAL/PLATELET
Abs Immature Granulocytes: 0.03 10*3/uL (ref 0.00–0.07)
Basophils Absolute: 0 10*3/uL (ref 0.0–0.1)
Basophils Relative: 1 %
Eosinophils Absolute: 0.1 10*3/uL (ref 0.0–0.5)
Eosinophils Relative: 2 %
HCT: 23.9 % — ABNORMAL LOW (ref 36.0–46.0)
Hemoglobin: 7.9 g/dL — ABNORMAL LOW (ref 12.0–15.0)
Immature Granulocytes: 1 %
Lymphocytes Relative: 18 %
Lymphs Abs: 0.9 10*3/uL (ref 0.7–4.0)
MCH: 31.5 pg (ref 26.0–34.0)
MCHC: 33.1 g/dL (ref 30.0–36.0)
MCV: 95.2 fL (ref 80.0–100.0)
Monocytes Absolute: 0.5 10*3/uL (ref 0.1–1.0)
Monocytes Relative: 10 %
Neutro Abs: 3.3 10*3/uL (ref 1.7–7.7)
Neutrophils Relative %: 68 %
Platelets: 229 10*3/uL (ref 150–400)
RBC: 2.51 MIL/uL — ABNORMAL LOW (ref 3.87–5.11)
RDW: 12.7 % (ref 11.5–15.5)
WBC: 4.8 10*3/uL (ref 4.0–10.5)
nRBC: 0 % (ref 0.0–0.2)

## 2020-01-17 LAB — PHOSPHORUS: Phosphorus: 2.7 mg/dL (ref 2.5–4.6)

## 2020-01-17 MED ORDER — POTASSIUM CHLORIDE CRYS ER 20 MEQ PO TBCR
60.0000 meq | EXTENDED_RELEASE_TABLET | Freq: Once | ORAL | Status: AC
  Administered 2020-01-17: 60 meq via ORAL
  Filled 2020-01-17: qty 3

## 2020-01-17 MED ORDER — MAGNESIUM OXIDE 400 (241.3 MG) MG PO TABS
400.0000 mg | ORAL_TABLET | Freq: Once | ORAL | Status: AC
  Administered 2020-01-17: 400 mg via ORAL
  Filled 2020-01-17: qty 1

## 2020-01-17 MED ORDER — IPRATROPIUM BROMIDE HFA 17 MCG/ACT IN AERS: 2.0000 | INHALATION_SPRAY | RESPIRATORY_TRACT | 12 refills | Status: DC | PRN

## 2020-01-17 MED ORDER — ONDANSETRON HCL 4 MG PO TABS: 4.0000 mg | ORAL_TABLET | Freq: Four times a day (QID) | ORAL | 0 refills | Status: DC | PRN

## 2020-01-17 MED ORDER — LISINOPRIL 5 MG PO TABS: 5.0000 mg | ORAL_TABLET | Freq: Every day | ORAL | 0 refills | Status: DC

## 2020-01-17 MED ORDER — THIAMINE HCL 100 MG PO TABS: 100.0000 mg | ORAL_TABLET | Freq: Every day | ORAL | 0 refills | Status: DC

## 2020-01-17 MED ORDER — ALBUTEROL SULFATE HFA 108 (90 BASE) MCG/ACT IN AERS: 2.0000 | INHALATION_SPRAY | Freq: Four times a day (QID) | RESPIRATORY_TRACT | 0 refills | Status: DC | PRN

## 2020-01-17 MED ORDER — FOLIC ACID 1 MG PO TABS: 1.0000 mg | ORAL_TABLET | Freq: Every day | ORAL | 0 refills | Status: DC

## 2020-01-17 MED ORDER — PANTOPRAZOLE SODIUM 20 MG PO TBEC: 20.0000 mg | DELAYED_RELEASE_TABLET | Freq: Every day | ORAL | 0 refills | Status: DC

## 2020-01-17 MED ORDER — ALBUTEROL SULFATE HFA 108 (90 BASE) MCG/ACT IN AERS
2.0000 | INHALATION_SPRAY | Freq: Four times a day (QID) | RESPIRATORY_TRACT | 0 refills | Status: DC | PRN
Start: 2020-01-17 — End: 2020-03-21

## 2020-01-17 MED ORDER — NICOTINE 21 MG/24HR TD PT24: 21.0000 mg | MEDICATED_PATCH | Freq: Every day | TRANSDERMAL | 0 refills | Status: DC

## 2020-01-17 MED ORDER — PREDNISONE 50 MG PO TABS
50.0000 mg | ORAL_TABLET | Freq: Every day | ORAL | 0 refills | Status: AC
Start: 2020-01-17 — End: 2020-01-21

## 2020-01-17 MED FILL — LISINOPRIL 5 MG TABS: 5 | 30 days supply | Qty: 30 | Fill #0

## 2020-01-17 MED FILL — ATROVENT HFA INHALER: 17 | 25 days supply | Qty: 13 | Fill #0

## 2020-01-17 MED FILL — FOLIC ACID 1 MG TABS: 1 | 30 days supply | Qty: 30 | Fill #0

## 2020-01-17 MED FILL — predniSONE 50 MG TABS: 50 | 4 days supply | Qty: 4 | Fill #0

## 2020-01-17 MED FILL — ONDANSETRON HCL 4 MG TABLET: 4 | 5 days supply | Qty: 20 | Fill #0

## 2020-01-17 MED FILL — VITAMIN B-1 100 MG TABS: 100 | 30 days supply | Qty: 30 | Fill #0

## 2020-01-17 MED FILL — PANTOPRAZOLE SOD DR 20 MG T: 20 | 30 days supply | Qty: 30 | Fill #0

## 2020-01-17 NOTE — Progress Notes (Signed)
Patient discharged home by private care with daughter.  All personal belongings gathered and taken with patiet at time of discharge.  TOC pharmacy brought medications to patient.  Daughter and patient acknowledged discharge instructions and denied further questions.

## 2020-01-17 NOTE — Progress Notes (Signed)
Physical Therapy Treatment Patient Details Name: Anna Mcguire MRN: [id number] DOB: 1956-02-08 Today's Date: 01/17/2020    History of Present Illness Patient was admitted to the hospital with an exacerbation of COPD and presented with a sore throat. She has soreness in bilateral hips following a fall. PMH: arthritis, HTN, Acid reflux     PT Comments    Pt is progressing well towards goals. She was able to increase ambulation distance during today's session, however gait is more antalgic than previously described. Pt reports her LEs are sore from fall out of bed yesterday. Pt able to perform peri care independently and dons and doffs underwear w/o assist. She is expected to d/c today.    Follow Up Recommendations  Supervision/Assistance - 24 hour     Equipment Recommendations  Hospital bed    Recommendations for Other Services       Precautions / Restrictions Precautions Precautions: None    Mobility  Bed Mobility Overal bed mobility: Modified Independent             General bed mobility comments: Mod I using bed railings for support  Transfers Overall transfer level: Modified independent Equipment used: Straight cane             General transfer comment: stood with cane on R.   Ambulation/Gait Ambulation/Gait assistance: Min guard Gait Distance (Feet): 550 Feet Assistive device: Straight cane Gait Pattern/deviations: Step-through pattern;Decreased stance time - right;Decreased stride length;Decreased step length - left;Decreased dorsiflexion - right Gait velocity: decreased    General Gait Details: Pt with mildly antalgic gait. When questioned about pain in LEs, pt reports she is in pain from a fall out of bed the previous day. LOB 1x during turn, requiring min A to correct. Otherwise she was able to ambulate with min guard   Stairs             Wheelchair Mobility    Modified Rankin (Stroke Patients Only)       Balance Overall balance  assessment: Needs assistance Sitting-balance support: No upper extremity supported;Feet unsupported Sitting balance-Leahy Scale: Good     Standing balance support: Single extremity supported;During functional activity Standing balance-Leahy Scale: Good Standing balance comment: no syncope or loss of balance with gait                             Cognition Arousal/Alertness: Awake/alert Behavior During Therapy: WFL for tasks assessed/performed Overall Cognitive Status: Within Functional Limits for tasks assessed                                 General Comments: Patient very pleasent, though speech difficult to understand at times.      Exercises      General Comments General comments (skin integrity, edema, etc.): Assisted to restroom. pt able to don and doff underwear w/o assist and perform peri care independently.       Pertinent Vitals/Pain Pain Assessment: Faces Faces Pain Scale: Hurts a little bit Pain Location: pt reports pain "everywhere" Pain Descriptors / Indicators: Aching;Discomfort;Guarding;Grimacing Pain Intervention(s): Monitored during session;Limited activity within patient's tolerance;Repositioned    Home Living                      Prior Function            PT Goals (current goals can now be found in the  care plan section) Acute Rehab PT Goals Patient Stated Goal: to go home  PT Goal Formulation: With patient Time For Goal Achievement: 01/21/20 Potential to Achieve Goals: Good Progress towards PT goals: Progressing toward goals    Frequency    Min 2X/week      PT Plan Current plan remains appropriate    Co-evaluation              AM-PAC PT "6 Clicks" Mobility   Outcome Measure  Help needed turning from your back to your side while in a flat bed without using bedrails?: None Help needed moving from lying on your back to sitting on the side of a flat bed without using bedrails?: None Help needed  moving to and from a bed to a chair (including a wheelchair)?: None Help needed standing up from a chair using your arms (e.g., wheelchair or bedside chair)?: None Help needed to walk in hospital room?: A Little Help needed climbing 3-5 steps with a railing? : A Little 6 Click Score: 22    End of Session Equipment Utilized During Treatment: Gait belt Activity Tolerance: Patient tolerated treatment well Patient left: in chair;with call bell/phone within reach Nurse Communication: Mobility status PT Visit Diagnosis: Other abnormalities of gait and mobility (R26.89)     Time: 3704-8889 PT Time Calculation (min) (ACUTE ONLY): 30 min  Charges:  $Gait Training: 23-37 mins                     Kallie Locks, [place] Pager [phone] Acute Rehab   Sheral Apley 01/17/2020, 11:53 AM

## 2020-01-17 NOTE — Discharge Summary (Signed)
Physician Discharge Summary  Rene KocherGertrue Savannah Hagy ZOX:[id number] DOB: 11/18/1955 DOA: 01/12/2020  PCP: Cain SaupeFulp, Cammie, MD  Admit date: 01/12/2020 Discharge date: 01/17/2020  Time spent: 30 minutes  Recommendations for Outpatient Follow-up:    Hyponatremia: -Sodium 124 on admission. Also likely multifactorial from GI losses, HCTZ, and alcohol use. -S/p 2 L normal saline bolus in the ED -Hold HCTZ Recent Labs  Lab 01/13/20 1747 01/14/20 0458 01/15/20 0258 01/16/20 0255 01/17/20 0456  NA 128* 132* 130* 130* 132*  -Improved with hydration -7/12 increase sodium chloride tablet 2g TID  SIADH --believe meets criteria for SIADH.  Urine osmolality 187 ( high), serum osmolality 268 (low), BUN (low),   -Most likely secondary to EtOH abuse -Fluid restrict 1000ml per day -Schedule in 2 days with Dr. Cain Saupeammie Fulp, SIADH, HTN, EtOH abuse, multiple electrolyte abnormalities.  COPD with acute exacerbation: -Treated for presumed COPD exacerbation on ED arrival with significant improvement after receiving IV Solu-Medrol and DuoNeb treatment.  -Titrate O2 to maintain SPO2 > 88% -Continue as needed duo nebs and albuterol SATURATION QUALIFICATIONS: (Thisnote is usedto comply with regulatory documentation for home oxygen) Patient Saturations on Room Air at Rest =100% Patient Saturations on Room Air while Ambulating =98% -Patient does not meet criteria for home O2  Hypertension: -Amlodipine 10 mg daily -.7/11 increase Lisinopril 5 mg daily  Hyperlipidemia: -7/11 LDL= 33 -Lipitor 80 mg daily.  Tobacco use: -Smoking 1 pack/day for last 20 years. She is advised on smoking cessation.  -Nicotine patch ordered.  EtOH abuse -Reports drinking 2-233fortyounce beers daily. She is advised on alcohol cessation.   Drug abuse -7/10 urine rapid drug screen positive THC -Patient advised that she must completely stop smoking marijuana given her multiple health problems.  Hypokalemia -K  2.7 on admission with magnesium 1.8. Likely multifactorial from GI losses and HCTZ use. IV repletion initiated in the ED,Hold HCTZ. -7/13 K-Dur 60 mEq prior to discharge  Hypomagnesmia -Magnesium goal> 2 -Magnesium oxide 400 mg prior to discharge  Hypophosphatemia -Resolved, continue to monitor  Diarrhea x2 months (chronic)  -Most likely secondary to her alcoholism however for completeness will obtain stool sample for PCR -GI panel  positive Enteroaggregative E. coli -Discussed case with Dr. Staci Righterobert Comer, ID recommended ceftriaxone IV 2 g x 1 -Pantoprazole 20 mg daily -7/12 advance diet BRAT  ADDENDUM; Fall -Patient had witnessed fall where she fell on her buttocks did not strike any furniture, did not strike her head.     Discharge Diagnoses:  Principal Problem:   Hypokalemia Active Problems:   Tobacco use   Hyponatremia   Hypertension   COPD with acute exacerbation (HCC)   Alcohol use   Alcohol abuse   Tobacco abuse   Chronic diarrhea   Hypophosphatemia   Discharge Condition: Stable  Diet recommendation: Lauraine RinneBRAT  Filed Weights   01/12/20 1307  Weight: 59 kg    History of present illness:  64 y.o.BF PMHx HTN, HLD, COPD, EtOH abuse, tobacco abuse,   Presents to the ED for evaluation of shortness of breath.Patient states she has been having 1 to 2 weeks of sore throat, dry cough, and occasional shortness of breath. She has been treated by her primary care for suspected COPD with antibiotics and bronchodilators. She also reports 1 month of frequent loose stools with last bowel movement morning of 01/12/2020. She has not seen any obvious bleeding. She denies any subjective fevers or diaphoresis but has had some chills. She denies any chest pain, abdominal pain, dysuria, or peripheral edema.  She is a current smoker, reports smoking 1 pack/day for 20 years. She reports chronic daily alcohol use consisting of two-three40 ounce beers per day. She denies  any cocaine or IV drug use.  ED Course: Initial vitals showed BP 120/72, pulse 84, RR 17, temp 98.3 Fahrenheit, SPO2 98% on room air.  Labs are notable for potassium 2.7, magnesium 1.8, sodium 124, bicarb 24, BUN 9, creatinine 0.96, serum glucose 79, WBC 2.7, hemoglobin 10.3, platelets 274,000, high-sensitivity troponin I 7.  Urinalysis is negative for UTI. POC SARS-CoV-2 antigen test is negative.  Portable chest x-ray is negative for focal consolidation, edema, or effusion.  Patient was given 2 L normal saline, DuoNeb treatment, IV magnesium 2 g, IV Solu-Medrol 125 mg, IV K 10 mEq x 2 and oral K 40 mEq x 1. The hospitalist service was consulted to admit for further evaluation and management.  Hospital Course:  See above  Cultures  7/8 SARS coronavirus negative 7/8 group A strep by PCR negative 7/9 GI panel by PCR positive Enteroaggregative E. coli   Antibiotics Anti-infectives (From admission, onward)   Start     Ordered Stop   01/15/20 1530  cefTRIAXone (ROCEPHIN) 2 g in sodium chloride 0.9 % 100 mL IVPB        01/15/20 1507 01/15/20 1630       Discharge Exam: Vitals:   01/16/20 1630 01/16/20 1928 01/17/20 0530 01/17/20 0746  BP: (!) 164/91 133/71 123/73 115/69  Pulse:  87 86 84  Resp: 18 16 16 15   Temp: 98.2 F (36.8 C) 99 F (37.2 C) 99.3 F (37.4 C) 98.9 F (37.2 C)  TempSrc: Oral Oral Oral Oral  SpO2: 100% 100% 100% 100%  Weight:      Height:        General: A/O x4, No acute respiratory distress, cachectic Eyes: negative scleral hemorrhage, negative anisocoria, negative icterus ENT: Negative Runny nose, negative gingival bleeding, Neck:  Negative scars, masses, torticollis, lymphadenopathy, JVD Lungs: Clear to auscultation bilaterally without wheezes or crackles Cardiovascular: Regular rate and rhythm without murmur gallop or rub normal S1 and S2 Abdomen: negative abdominal pain, nondistended, positive soft, bowel sounds, no rebound, no ascites, no  appreciable mass  Discharge Instructions   Allergies as of 01/17/2020      Reactions   Penicillins Diarrhea      Medication List    STOP taking these medications   hydrochlorothiazide 25 MG tablet Commonly known as: HYDRODIURIL   thiamine 100 MG tablet Commonly known as: Vitamin B-1 Replaced by: thiamine 100 MG tablet     TAKE these medications   albuterol 108 (90 Base) MCG/ACT inhaler Commonly known as: VENTOLIN HFA Inhale 2 puffs into the lungs every 6 (six) hours as needed for wheezing or shortness of breath. What changed: Another medication with the same name was added. Make sure you understand how and when to take each.   albuterol 108 (90 Base) MCG/ACT inhaler Commonly known as: VENTOLIN HFA Inhale 2 puffs into the lungs every 6 (six) hours as needed for wheezing or shortness of breath. What changed: You were already taking a medication with the same name, and this prescription was added. Make sure you understand how and when to take each.   amLODipine 10 MG tablet Commonly known as: NORVASC Take 1 tablet (10 mg total) by mouth daily. To lower blood pressure   aspirin EC 81 MG tablet Take 1 tablet (81 mg total) by mouth daily.   atorvastatin 80 MG tablet Commonly  known as: LIPITOR Take 1 tablet (80 mg total) by mouth daily.   folic acid 1 MG tablet Commonly known as: FOLVITE Take 1 tablet (1 mg total) by mouth daily. Start taking on: January 18, 2020   ipratropium 17 MCG/ACT inhaler Commonly known as: ATROVENT HFA Inhale 2 puffs into the lungs every 4 (four) hours as needed for wheezing.   lisinopril 5 MG tablet Commonly known as: ZESTRIL Take 1 tablet (5 mg total) by mouth daily. Start taking on: January 18, 2020   loratadine 10 MG tablet Commonly known as: CLARITIN Take 1 tablet (10 mg total) by mouth daily.   meloxicam 15 MG tablet Commonly known as: MOBIC TAKE 1 TABLET(15 MG) BY MOUTH DAILY AFTER A MEAL AS NEEDED FOR JOINT PAIN What changed: See the  new instructions.   nicotine 21 mg/24hr patch Commonly known as: NICODERM CQ - dosed in mg/24 hours Place 1 patch (21 mg total) onto the skin daily. Start taking on: January 18, 2020   ondansetron 4 MG tablet Commonly known as: ZOFRAN Take 1 tablet (4 mg total) by mouth every 6 (six) hours as needed for nausea.   pantoprazole 20 MG tablet Commonly known as: PROTONIX Take 1 tablet (20 mg total) by mouth daily. Start taking on: January 18, 2020 What changed:   medication strength  how much to take   thiamine 100 MG tablet Take 1 tablet (100 mg total) by mouth daily. Start taking on: January 18, 2020 Replaces: thiamine 100 MG tablet     ASK your doctor about these medications   predniSONE 50 MG tablet Commonly known as: DELTASONE Take 1 tablet (50 mg total) by mouth daily for 4 days. Ask about: Should I take this medication?      Allergies  Allergen Reactions  . Penicillins Diarrhea    Follow-up Information    Fulp, Cammie, MD In 2 days.   Specialty: Family Medicine Contact information: [address] [place] [address] [phone]        [facility] EMERGENCY DEPARTMENT.   Specialty: Emergency Medicine Why: If symptoms worsen Contact information: [address] [id number] mc [place] [address] [phone]               The results of significant diagnostics from this hospitalization (including imaging, microbiology, ancillary and laboratory) are listed below for reference.    Significant Diagnostic Studies: DG Chest Portable 1 View  Result Date: 01/12/2020 CLINICAL DATA:  64 year old female with shortness of breath. EXAM: PORTABLE CHEST 1 VIEW COMPARISON:  Chest radiograph dated 11/23/2019. FINDINGS: No focal consolidation, pleural effusion, pneumothorax. The cardiac silhouette is within limits. No acute osseous pathology. IMPRESSION: No active disease. Electronically Signed   By: Elgie Collard M.D.   On:  01/12/2020 17:28    Microbiology: Recent Results (from the past 240 hour(s))  Group A Strep by PCR     Status: None   Collection Time: 01/12/20  1:08 PM   Specimen: Throat; Sterile Swab  Result Value Ref Range Status   Group A Strep by PCR NOT DETECTED NOT DETECTED Final    Comment: Performed at [facility], [address], [place], [place] [address]  Gastrointestinal Panel by PCR , Stool     Status: Abnormal   Collection Time: 01/14/20  1:40 PM   Specimen: Urine, Random; Stool  Result Value Ref Range Status   Campylobacter species NOT DETECTED NOT DETECTED Final   Plesimonas shigelloides NOT DETECTED NOT DETECTED  Final   Salmonella species NOT DETECTED NOT DETECTED Final   Yersinia enterocolitica NOT DETECTED NOT DETECTED Final   Vibrio species NOT DETECTED NOT DETECTED Final   Vibrio cholerae NOT DETECTED NOT DETECTED Final   Enteroaggregative E coli (EAEC) DETECTED (A) NOT DETECTED Final    Comment: RESULT CALLED TO, READ BACK BY AND VERIFIED WITH: DAVID RHINEHART @2008  01/14/20 MJU    Enteropathogenic E coli (EPEC) NOT DETECTED NOT DETECTED Final   Enterotoxigenic E coli (ETEC) NOT DETECTED NOT DETECTED Final   Shiga like toxin producing E coli (STEC) NOT DETECTED NOT DETECTED Final   Shigella/Enteroinvasive E coli (EIEC) NOT DETECTED NOT DETECTED Final   Cryptosporidium NOT DETECTED NOT DETECTED Final   Cyclospora cayetanensis NOT DETECTED NOT DETECTED Final   Entamoeba histolytica NOT DETECTED NOT DETECTED Final   Giardia lamblia NOT DETECTED NOT DETECTED Final   Adenovirus F40/41 NOT DETECTED NOT DETECTED Final   Astrovirus NOT DETECTED NOT DETECTED Final   Norovirus GI/GII NOT DETECTED NOT DETECTED Final   Rotavirus A NOT DETECTED NOT DETECTED Final   Sapovirus (I, II, IV, and V) NOT DETECTED NOT DETECTED Final    Comment: Performed at [facility], [address] [place], [place]     Labs: Basic Metabolic Panel: Recent Labs  Lab  01/13/20 1747 01/14/20 0458 01/15/20 0258 01/16/20 0255 01/17/20 0456  NA 128* 132* 130* 130* 132*  K 3.8 3.0* 3.8 3.7 3.2*  CL 95* 98 100 98 102  CO2 22 25 24 25 22   GLUCOSE 115* 97 87 91 91  BUN 9 7* <5* <5* <5*  CREATININE 1.12* 0.97 0.83 0.79 0.78  CALCIUM 9.7 9.4 9.6 10.2 10.0  MG 2.1 1.7 2.1 1.4* 1.8  PHOS <1.0* 3.9 2.2* 2.8 2.7   Liver Function Tests: Recent Labs  Lab 01/13/20 1747 01/14/20 0458 01/15/20 0258 01/16/20 0255 01/17/20 0456  AST 39 38 30 26 27   ALT 24 20 19 19 20   ALKPHOS 85 75 64 67 61  BILITOT 0.8 0.9 0.3 0.7 0.7  PROT 5.6* 5.0* 4.7* 5.1* 4.7*  ALBUMIN 3.6 3.2* 3.0* 3.4* 3.0*   No results for input(s): LIPASE, AMYLASE in the last 168 hours. No results for input(s): AMMONIA in the last 168 hours. CBC: Recent Labs  Lab 01/12/20 1700 01/13/20 0210 01/13/20 1747 01/14/20 0458 01/15/20 0258 01/16/20 0255 01/17/20 0456  WBC 2.7*   < > 7.2 5.3 6.4 6.0 4.8  NEUTROABS 1.2*  --   --  3.5 4.6 4.0 3.3  HGB 10.3*   < > 9.6* 8.7* 8.9* 8.5* 7.9*  HCT 28.0*   < > 27.9* 26.0* 26.3* 25.7* 23.9*  MCV 89.2   < > 91.5 93.9 94.9 94.8 95.2  PLT 274   < > 274 247 264 241 229   < > = values in this interval not displayed.   Cardiac Enzymes: No results for input(s): CKTOTAL, CKMB, CKMBINDEX, TROPONINI in the last 168 hours. BNP: BNP (last 3 results) No results for input(s): BNP in the last 8760 hours.  ProBNP (last 3 results) No results for input(s): PROBNP in the last 8760 hours.  CBG: No results for input(s): GLUCAP in the last 168 hours.     Signed:  03/16/20, MD [facility] [phone] pager

## 2020-01-17 NOTE — Plan of Care (Signed)

## 2020-01-17 NOTE — TOC CAGE-AID Note (Signed)
Transition of Care [facility]) - CAGE-AID Screening   Patient Details  Name: Zuma Hust MRN: [id number] Date of Birth: 1956-05-03  [facility]) CM/SW Contact:    Curlene Labrum, RN Phone Number: 01/17/2020, 12:36 PM   Clinical Narrative: Case management met with the patient regarding history of alcohol use and smoking history.  Patient states that she lives alone with a daughter that lives in [place].  Patient states that she occasionally drinks a beer at home.  She does not drive.  Daughter provides transportation.  The patient smokes about 10 cigarettes a day.  Patient given information regarding smoking cessation.     CAGE-AID Screening:    Have You Ever Felt You Ought to Cut Down on Your Drinking or Drug Use?: Yes Have People Annoyed You By Critizing Your Drinking Or Drug Use?: No Have You Felt Bad Or Guilty About Your Drinking Or Drug Use?: No Have You Ever Had a Drink or Used Drugs First Thing In The Morning to Steady Your Nerves or to Get Rid of a Hangover?: No CAGE-AID Score: 1  Substance Abuse Education Offered: Yes  Substance abuse interventions: Patient Counseling

## 2020-01-18 ENCOUNTER — Ambulatory Visit: Payer: Medicare Other | Admitting: Family Medicine

## 2020-01-18 ENCOUNTER — Telehealth: Payer: Self-pay

## 2020-01-18 ENCOUNTER — Telehealth: Payer: Self-pay | Admitting: Family Medicine

## 2020-01-18 NOTE — Telephone Encounter (Signed)
PT called in during lunch explaining had had a really bad fall and been admitted to the hospital hurts all over, reading notes COPD is worse. was told she has to have a FU within 2 days with PCP. Upon questioning/screen pt has been vaccinated but states her cough is very bad now. Please reach out to pt for an appt as pt was willing to do virtual appt but could not get it to take in Epic and unsure if PCP would want Virtual or in Office as to COPD issues vs covid. Pls reach out to pt at [phone]

## 2020-01-18 NOTE — Telephone Encounter (Signed)
Transition Care Management Follow-up Telephone Call  Date of discharge and from where: 01/17/2020, [facility]   How have you been since you were released from the hospital? She said she is feeling much better but still has some problems with her breathing. She said that her inhalers have helped her breathing and she uses them as needed  Any questions or concerns?  no other questions/concerns reported  Items Reviewed:  Did the pt receive and understand the discharge instructions provided?  yes, she said she did not have any questions  Medications obtained and verified?  she said she has all medications, including the new ones, and did not have any questions or need to review the med list.  Reminded her to check the list to review medications that she is to stop taking.   Any new allergies since your discharge?  none reported  Do you have support at home? she lives alone but said her daughter can come to help her if needed.  No home health or DME ordered. Has a cane.   Functional Questionnaire: (I = Independent and D = Dependent) ADLsindependent  Follow up appointments reviewed:   PCP Hospital f/u appt confirmed?  Dr Laural Benes 01/20/2020 @ 1330- virtual  [facility] f/u appt confirmed?.podiatry 02/08/2020.   Are transportation arrangements needed? no, her daughter drives.  Explained to her that she can contact DSS to register for medicaid transportation and she said she will call if needed  If their condition worsens, is the pt aware to call PCP or go to the Emergency Dept.?  yes  Was the patient provided with contact information for the PCP's office or ED? she has the clinic phone number  Was to pt encouraged to call back with questions or concerns? yes

## 2020-01-20 ENCOUNTER — Other Ambulatory Visit: Payer: Self-pay

## 2020-01-20 ENCOUNTER — Ambulatory Visit: Payer: Medicare Other | Attending: Internal Medicine | Admitting: Internal Medicine

## 2020-01-26 ENCOUNTER — Other Ambulatory Visit: Payer: Self-pay | Admitting: Internal Medicine

## 2020-01-26 ENCOUNTER — Telehealth: Payer: Self-pay | Admitting: Pulmonary Disease

## 2020-01-26 ENCOUNTER — Telehealth: Payer: Self-pay | Admitting: Family Medicine

## 2020-01-26 ENCOUNTER — Encounter: Payer: Self-pay | Admitting: Emergency Medicine

## 2020-01-26 MED ORDER — HYDROXYZINE HCL 10 MG PO TABS: 10.0000 mg | ORAL_TABLET | Freq: Three times a day (TID) | ORAL | 0 refills | Status: DC | PRN

## 2020-01-26 NOTE — Telephone Encounter (Signed)
I sent in Rx for Hydroxyzine for itching. Would you be able to call patient back please? I see she has a visit scheduled already at [facility] 7/26.   Marcy Siren, D.O. Primary Care at [facility]  01/26/2020, 4:08 PM

## 2020-01-26 NOTE — Telephone Encounter (Signed)
Left VM to call back 

## 2020-01-26 NOTE — Telephone Encounter (Signed)
Copied from CRM [id number]. Topic: General - Inquiry >> Jan 25, 2020  5:37 PM Leary Roca wrote: Reason for CRM: Pt called in requesting medication for itching . She would like someone to call her back

## 2020-01-27 NOTE — Telephone Encounter (Signed)
Patient notified that prescription for itching was sent to the pharmacy. Patient states she has picked up the prescription and is feeling much better. Patient advised of appt on 01/30/20 at 3:30pm.

## 2020-01-27 NOTE — Telephone Encounter (Signed)
Patient called and left message to return call to office.

## 2020-01-30 ENCOUNTER — Other Ambulatory Visit: Payer: Self-pay

## 2020-01-30 ENCOUNTER — Encounter: Payer: Self-pay | Admitting: Internal Medicine

## 2020-01-30 ENCOUNTER — Ambulatory Visit: Payer: Medicare Other | Attending: Internal Medicine | Admitting: Internal Medicine

## 2020-01-30 VITALS — BP 115/62 | HR 77 | Temp 97.3°F | Resp 16 | Ht 66.0 in | Wt 122.0 lb

## 2020-01-30 DIAGNOSIS — N760 Acute vaginitis: Secondary | ICD-10-CM | POA: Diagnosis not present

## 2020-01-30 DIAGNOSIS — E222 Syndrome of inappropriate secretion of antidiuretic hormone: Secondary | ICD-10-CM | POA: Diagnosis not present

## 2020-01-30 DIAGNOSIS — J449 Chronic obstructive pulmonary disease, unspecified: Secondary | ICD-10-CM

## 2020-01-30 DIAGNOSIS — Z79899 Other long term (current) drug therapy: Secondary | ICD-10-CM | POA: Insufficient documentation

## 2020-01-30 DIAGNOSIS — I10 Essential (primary) hypertension: Secondary | ICD-10-CM | POA: Insufficient documentation

## 2020-01-30 DIAGNOSIS — F10288 Alcohol dependence with other alcohol-induced disorder: Secondary | ICD-10-CM | POA: Diagnosis not present

## 2020-01-30 DIAGNOSIS — Z791 Long term (current) use of non-steroidal anti-inflammatories (NSAID): Secondary | ICD-10-CM | POA: Insufficient documentation

## 2020-01-30 DIAGNOSIS — F1020 Alcohol dependence, uncomplicated: Secondary | ICD-10-CM | POA: Insufficient documentation

## 2020-01-30 DIAGNOSIS — Z09 Encounter for follow-up examination after completed treatment for conditions other than malignant neoplasm: Secondary | ICD-10-CM

## 2020-01-30 DIAGNOSIS — Z880 Allergy status to penicillin: Secondary | ICD-10-CM | POA: Diagnosis not present

## 2020-01-30 DIAGNOSIS — J441 Chronic obstructive pulmonary disease with (acute) exacerbation: Secondary | ICD-10-CM | POA: Diagnosis not present

## 2020-01-30 DIAGNOSIS — A09 Infectious gastroenteritis and colitis, unspecified: Secondary | ICD-10-CM | POA: Insufficient documentation

## 2020-01-30 DIAGNOSIS — F17200 Nicotine dependence, unspecified, uncomplicated: Secondary | ICD-10-CM | POA: Insufficient documentation

## 2020-01-30 DIAGNOSIS — M1712 Unilateral primary osteoarthritis, left knee: Secondary | ICD-10-CM | POA: Insufficient documentation

## 2020-01-30 DIAGNOSIS — Z716 Tobacco abuse counseling: Secondary | ICD-10-CM | POA: Insufficient documentation

## 2020-01-30 DIAGNOSIS — E876 Hypokalemia: Secondary | ICD-10-CM | POA: Diagnosis not present

## 2020-01-30 DIAGNOSIS — Z7982 Long term (current) use of aspirin: Secondary | ICD-10-CM | POA: Diagnosis not present

## 2020-01-30 MED ORDER — FOLIC ACID 1 MG PO TABS: 1.0000 mg | ORAL_TABLET | Freq: Every day | ORAL | 3 refills | Status: DC

## 2020-01-30 MED ORDER — ALBUTEROL SULFATE (2.5 MG/3ML) 0.083% IN NEBU: 2.5000 mg | INHALATION_SOLUTION | Freq: Four times a day (QID) | RESPIRATORY_TRACT | 1 refills | Status: DC | PRN

## 2020-01-30 MED ORDER — SPIRIVA HANDIHALER 18 MCG IN CAPS: 18.0000 ug | ORAL_CAPSULE | Freq: Every day | RESPIRATORY_TRACT | 12 refills | Status: DC

## 2020-01-30 MED ORDER — LISINOPRIL 5 MG PO TABS: 5.0000 mg | ORAL_TABLET | Freq: Every day | ORAL | 4 refills | Status: DC

## 2020-01-30 MED ORDER — AMLODIPINE BESYLATE 10 MG PO TABS: 10.0000 mg | ORAL_TABLET | Freq: Every day | ORAL | 1 refills | Status: DC

## 2020-01-30 MED ORDER — FLUCONAZOLE 150 MG PO TABS: 150.0000 mg | ORAL_TABLET | Freq: Once | ORAL | 0 refills | Status: AC

## 2020-01-30 MED ORDER — HYDROXYZINE HCL 10 MG PO TABS: 10.0000 mg | ORAL_TABLET | Freq: Three times a day (TID) | ORAL | 0 refills | Status: DC | PRN

## 2020-01-30 MED ORDER — NICOTINE 21 MG/24HR TD PT24: 21.0000 mg | MEDICATED_PATCH | Freq: Every day | TRANSDERMAL | 1 refills | Status: DC

## 2020-01-30 NOTE — Progress Notes (Signed)
Patient ID: Anna Mcguire, female    DOB: 03-29-1956  MRN: [id number]  CC: Session of care visit Date of hospitalization: 7/8-13/2021 Date of call from case worker: 01/18/2020  Subjective: Anna Mcguire is a 64 y.o. female who presents for transition of care visit. Her concerns today include:  Patient with history of HTN, HL, tob dep, COPD, OA left knee, EtOH abuse, chronic diarrhea.  PCP is Dr. Jillyn Hidden  Patient hospitalized with shortness of breath which she was having for 1 to 2 weeks prior to admission.  This was associated with dry cough.  She was assessed to have COPD exacerbation.  She was treated appropriately with IV steroids and DuoNeb treatments.  She improved and was able to be discharged home without oxygen.  Discharge on Atrovent and albuterol inhalers.  Hospital course complicated by electrolyte abnormalities including hyponatremia, hypokalemia, hypomagnesemia and hypophosphatemia.  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  She admitted to drinking 2 to 340 ounce beers daily.  She was also having diarrhea for 2 months.  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  Patient placed on Protonix.  HCTZ was discontinued.  She had a witnessed fall in the hospital without injury.  Today: COPD: Patient reports that her breathing is better.  She does not have albuterol or Atrovent inhaler with her.  She tells me she is using them about 3-4 times a day.  She is requesting a nebulizer machine and treatment to go with the machine.  She is trying to quit smoking.  She has been using the nicotine patches but ran out yesterday.  Endorses strong cravings.  Would like a refill on the patches.  SIADH: Patient was not aware that HCTZ was discontinued.  She got a refill on it yesterday and took a dose yesterday and today.  She tells me that she has cut back on drinking.  She states that she drinks  Two 40 ounce beers a few  times a week instead of daily.  She tells me that she went through treatment programs in the past.  She is interested in going to [employer] [facility] [employer]   -Reports that diarrhea is better.  She last had diarrhea about 3 days ago.  No blood in the stools.  She is ambulating with a cane.  Denies any falls since hospitalization.  She lives alone.    Patient Active Problem List   Diagnosis Date Noted  . Hyponatremia 01/13/2020  . Alcohol abuse 01/13/2020  . Tobacco abuse 01/13/2020  . Chronic diarrhea 01/13/2020  . Hypophosphatemia 01/13/2020  . Hypertension   . COPD with acute exacerbation (HCC)   . Alcohol use   . Hypokalemia 01/12/2020  . Tobacco use 10/06/2019  . Primary osteoarthritis of left knee 09/08/2019  . Pain in right shoulder 08/11/2019     Current Outpatient Medications on File Prior to Visit  Medication Sig Dispense Refill  . albuterol (VENTOLIN HFA) 108 (90 Base) MCG/ACT inhaler Inhale 2 puffs into the lungs every 6 (six) hours as needed for wheezing or shortness of breath. 18 g 0  . aspirin EC 81 MG tablet Take 1 tablet (81 mg total) by mouth daily. 60 tablet 0  . atorvastatin (LIPITOR) 80 MG tablet Take 1 tablet (80 mg total) by mouth daily. 90 tablet 1  . loratadine (CLARITIN) 10 MG tablet Take 1 tablet (10 mg total) by mouth daily. 90 tablet 1  .  meloxicam (MOBIC) 15 MG tablet TAKE 1 TABLET(15 MG) BY MOUTH DAILY AFTER A MEAL AS NEEDED FOR JOINT PAIN (Patient taking differently: Take 15 mg by mouth daily. ) 90 tablet 0  . pantoprazole (PROTONIX) 20 MG tablet Take 1 tablet (20 mg total) by mouth daily. 30 tablet 0  . albuterol (VENTOLIN HFA) 108 (90 Base) MCG/ACT inhaler Inhale 2 puffs into the lungs every 6 (six) hours as needed for wheezing or shortness of breath. 18 g 0  . ondansetron (ZOFRAN) 4 MG tablet Take 1 tablet (4 mg total) by mouth every 6 (six) hours as needed for nausea. (Patient not taking: Reported on 01/30/2020) 20 tablet 0  . thiamine 100 MG tablet Take 1 tablet  (100 mg total) by mouth daily. 30 tablet 0  . [DISCONTINUED] fluticasone (FLONASE) 50 MCG/ACT nasal spray Place 1 spray into both nostrils daily.    . [DISCONTINUED] lisinopril-hydrochlorothiazide (PRINZIDE,ZESTORETIC) 20-12.5 MG per tablet Take 1 tablet by mouth daily.    . [DISCONTINUED] lovastatin (MEVACOR) 20 MG tablet Take 20 mg by mouth daily at 6 PM.     No current facility-administered medications on file prior to visit.    Allergies  Allergen Reactions  . Penicillins Diarrhea    Social History   Socioeconomic History  . Marital status: Single    Spouse name: Not on file  . Number of children: Not on file  . Years of education: Not on file  . Highest education level: Not on file  Occupational History  . Not on file  Tobacco Use  . Smoking status: Current Every Day Smoker  . Smokeless tobacco: Never Used  Substance and Sexual Activity  . Alcohol use: Yes    Comment: occ  . Drug use: Yes    Types: Marijuana  . Sexual activity: Not Currently  Other Topics Concern  . Not on file  Social History Narrative  . Not on file   Social Determinants of Health   Financial Resource Strain:   . Difficulty of Paying Living Expenses:   Food Insecurity:   . Worried About Programme researcher, broadcasting/film/video in the Last Year:   . Barista in the Last Year:   Transportation Needs:   . Freight forwarder (Medical):   Marland Kitchen Lack of Transportation (Non-Medical):   Physical Activity:   . Days of Exercise per Week:   . Minutes of Exercise per Session:   Stress:   . Feeling of Stress :   Social Connections:   . Frequency of Communication with Friends and Family:   . Frequency of Social Gatherings with Friends and Family:   . Attends Religious Services:   . Active Member of Clubs or Organizations:   . Attends Banker Meetings:   Marland Kitchen Marital Status:   Intimate Partner Violence:   . Fear of Current or Ex-Partner:   . Emotionally Abused:   Marland Kitchen Physically Abused:   . Sexually  Abused:     Family History  Problem Relation Age of Onset  . Kidney failure Mother   . Aneurysm Father     Past Surgical History:  Procedure Laterality Date  . COLONOSCOPY WITH PROPOFOL N/A 06/07/2014   Procedure: COLONOSCOPY WITH PROPOFOL;  Surgeon: Willis Modena, MD;  Location: [facility];  Service: Endoscopy;  Laterality: N/A;  . ESOPHAGOGASTRODUODENOSCOPY (EGD) WITH PROPOFOL N/A 06/07/2014   Procedure: ESOPHAGOGASTRODUODENOSCOPY (EGD) WITH PROPOFOL;  Surgeon: Willis Modena, MD;  Location: [facility];  Service: Endoscopy;  Laterality: N/A;  ROS: Review of Systems Complains of some vaginal itching over the past several days.  She has been using a new soap that she thinks may be causing irritation in her vagina.  PHYSICAL EXAM: BP (!) 115/62 (BP Location: Right Arm, Patient Position: Sitting, Cuff Size: Normal)   Pulse 77   Temp (!) 97.3 F (36.3 C)   Resp 16   Ht 5\' 6"  (1.676 m)   Wt 122 lb (55.3 kg)   SpO2 100%   BMI 19.69 kg/m   Wt Readings from Last 3 Encounters:  01/30/20 122 lb (55.3 kg)  01/12/20 130 lb (59 kg)  11/16/19 122 lb 12.8 oz (55.7 kg)    Physical Exam  General appearance -older African-American female in NAD.   Mental status - normal mood, behavior, speech, dress, motor activity, and thought processes Mouth - mucous membranes moist, pharynx normal without lesions Neck - supple, no significant adenopathy Chest - clear to auscultation, no wheezes, rales or rhonchi, symmetric air entry Heart - normal rate, regular rhythm, normal S1, S2, no murmurs, rubs, clicks or gallops Abdomen - soft, nontender, nondistended, no masses or organomegaly Extremities - peripheral pulses normal, no pedal edema, no clubbing or cyanosis She walks with a cane.  Her gait is stable.  CMP Latest Ref Rng & Units 01/17/2020 01/16/2020 01/15/2020  Glucose 70 - 99 mg/dL 91 91 87  BUN 8 - 23 mg/dL <1(O<5(L) <1(W<5(L) <9(U<5(L)  Creatinine 0.44 - 1.00 mg/dL 0.450.78 4.090.79 8.110.83  Sodium 135 -  145 mmol/L 132(L) 130(L) 130(L)  Potassium 3.5 - 5.1 mmol/L 3.2(L) 3.7 3.8  Chloride 98 - 111 mmol/L 102 98 100  CO2 22 - 32 mmol/L 22 25 24   Calcium 8.9 - 10.3 mg/dL 91.410.0 78.210.2 9.6  Total Protein 6.5 - 8.1 g/dL 4.7(L) 5.1(L) 4.7(L)  Total Bilirubin 0.3 - 1.2 mg/dL 0.7 0.7 0.3  Alkaline Phos 38 - 126 U/L 61 67 64  AST 15 - 41 U/L 27 26 30   ALT 0 - 44 U/L 20 19 19    Lipid Panel     Component Value Date/Time   CHOL 159 01/15/2020 0258   TRIG 117 01/15/2020 0258   HDL 103 01/15/2020 0258   CHOLHDL 1.5 01/15/2020 0258   VLDL 23 01/15/2020 0258   LDLCALC 33 01/15/2020 0258    CBC    Component Value Date/Time   WBC 4.8 01/17/2020 0456   RBC 2.51 (L) 01/17/2020 0456   HGB 7.9 (L) 01/17/2020 0456   HGB 11.6 07/29/2019 1610   HCT 23.9 (L) 01/17/2020 0456   HCT 33.8 (L) 07/29/2019 1610   PLT 229 01/17/2020 0456   PLT 305 07/29/2019 1610   MCV 95.2 01/17/2020 0456   MCV 94 07/29/2019 1610   MCH 31.5 01/17/2020 0456   MCHC 33.1 01/17/2020 0456   RDW 12.7 01/17/2020 0456   RDW 12.2 07/29/2019 1610   LYMPHSABS 0.9 01/17/2020 0456   MONOABS 0.5 01/17/2020 0456   EOSABS 0.1 01/17/2020 0456   BASOSABS 0.0 01/17/2020 0456    ASSESSMENT AND PLAN: 1. Hospital discharge follow-up  2. Essential hypertension At goal.  Advised patient to discontinue hydrochlorothiazide.  Continue amlodipine and lisinopril. - amLODipine (NORVASC) 10 MG tablet; Take 1 tablet (10 mg total) by mouth daily. To lower blood pressure  Dispense: 90 tablet; Refill: 1 - Basic Metabolic Panel - Magnesium  3. Chronic obstructive pulmonary disease, unspecified COPD type (HCC) Discontinue Atrovent inhaler.  Start Spiriva instead.  Continue albuterol inhaler.  Advised patient to  take her albuterol inhaler with her at all times.  We have given her nebulizer machine. - tiotropium (SPIRIVA HANDIHALER) 18 MCG inhalation capsule; Place 1 capsule (18 mcg total) into inhaler and inhale daily.  Dispense: 30 capsule; Refill:  12 - albuterol (PROVENTIL) (2.5 MG/3ML) 0.083% nebulizer solution; Take 3 mLs (2.5 mg total) by nebulization every 6 (six) hours as needed for wheezing or shortness of breath.  Dispense: 150 mL; Refill: 1  4. Tobacco dependence Strongly advised to quit.  Discussed health risks associated with smoking.  She is wanting to quit.  Refill given on nicotine patches.  Less than 5 minutes spent on counseling. - nicotine (NICODERM CQ - DOSED IN MG/24 HOURS) 21 mg/24hr patch; Place 1 patch (21 mg total) onto the skin daily.  Dispense: 28 patch; Refill: 1  5. SIADH (syndrome of inappropriate ADH production) (HCC) 6. Alcohol use disorder, moderate, dependence (HCC) Strongly advised her to quit drinking.  Discussed health risks associated with excess alcohol use.  We will have clinical social worker follow-up with her to give information about AA meetings. - folic acid (FOLVITE) 1 MG tablet; Take 1 tablet (1 mg total) by mouth daily.  Dispense: 30 tablet; Refill: 3 - Magnesium  7. Diarrhea of infectious origin This was treated while in the hospital.  8. Acute vaginitis - fluconazole (DIFLUCAN) 150 MG tablet; Take 1 tablet (150 mg total) by mouth once for 1 dose.  Dispense: 1 tablet; Refill: 0    Patient was given the opportunity to ask questions.  Patient verbalized understanding of the plan and was able to repeat key elements of the plan.   Orders Placed This Encounter  Procedures  . Basic Metabolic Panel  . Magnesium     Requested Prescriptions   Signed Prescriptions Disp Refills  . tiotropium (SPIRIVA HANDIHALER) 18 MCG inhalation capsule 30 capsule 12    Sig: Place 1 capsule (18 mcg total) into inhaler and inhale daily.  Marland Kitchen albuterol (PROVENTIL) (2.5 MG/3ML) 0.083% nebulizer solution 150 mL 1    Sig: Take 3 mLs (2.5 mg total) by nebulization every 6 (six) hours as needed for wheezing or shortness of breath.  . nicotine (NICODERM CQ - DOSED IN MG/24 HOURS) 21 mg/24hr patch 28 patch 1     Sig: Place 1 patch (21 mg total) onto the skin daily.  . folic acid (FOLVITE) 1 MG tablet 30 tablet 3    Sig: Take 1 tablet (1 mg total) by mouth daily.  Marland Kitchen lisinopril (ZESTRIL) 5 MG tablet 30 tablet 4    Sig: Take 1 tablet (5 mg total) by mouth daily.  Marland Kitchen amLODipine (NORVASC) 10 MG tablet 90 tablet 1    Sig: Take 1 tablet (10 mg total) by mouth daily. To lower blood pressure  . hydrOXYzine (ATARAX/VISTARIL) 10 MG tablet 30 tablet 0    Sig: Take 1 tablet (10 mg total) by mouth 3 (three) times daily as needed.  . fluconazole (DIFLUCAN) 150 MG tablet 1 tablet 0    Sig: Take 1 tablet (150 mg total) by mouth once for 1 dose.    Return in about 2 months (around 04/01/2020) for PCP.  Jonah Blue, MD, FACP

## 2020-01-30 NOTE — Telephone Encounter (Signed)
Error

## 2020-01-30 NOTE — Patient Instructions (Signed)
Stop hydrochlorothiazide.  I will have my social worker follow-up with you about treatment programs to help you quit drinking alcohol.  I have sent refills on the nicotine patches to your pharmacy to help you quit smoking.

## 2020-01-30 NOTE — Progress Notes (Signed)
Hospital f/u Request refills on medications  Concerns with falling   Would like a nebulizer machine to take home. Concerns with breathing

## 2020-01-31 ENCOUNTER — Other Ambulatory Visit: Payer: Self-pay | Admitting: Internal Medicine

## 2020-01-31 DIAGNOSIS — E876 Hypokalemia: Secondary | ICD-10-CM

## 2020-01-31 LAB — BASIC METABOLIC PANEL
BUN/Creatinine Ratio: 11 — ABNORMAL LOW (ref 12–28)
BUN: 10 mg/dL (ref 8–27)
CO2: 26 mmol/L (ref 20–29)
Calcium: 9.8 mg/dL (ref 8.7–10.3)
Chloride: 98 mmol/L (ref 96–106)
Creatinine, Ser: 0.92 mg/dL (ref 0.57–1.00)
GFR calc Af Amer: 75 mL/min/{1.73_m2} (ref >59)
GFR calc non Af Amer: 65 mL/min/{1.73_m2} (ref >59)
Glucose: 89 mg/dL (ref 65–99)
Potassium: 3 mmol/L — ABNORMAL LOW (ref 3.5–5.2)
Sodium: 139 mmol/L (ref 134–144)

## 2020-01-31 LAB — MAGNESIUM: Magnesium: 1.6 mg/dL (ref 1.6–2.3)

## 2020-01-31 MED ORDER — POTASSIUM CHLORIDE ER 8 MEQ PO TBCR: 16.0000 meq | EXTENDED_RELEASE_TABLET | Freq: Every day | ORAL | 0 refills | Status: DC

## 2020-01-31 NOTE — Progress Notes (Signed)
Let patient know that her potassium level is low.  I will send a prescription to her pharmacy for potassium supplement for her to take for the next 3 days.  Please return to the lab in 1 week after she is completed the supplements to have potassium level rechecked.  Magnesium level is normal.

## 2020-02-01 ENCOUNTER — Other Ambulatory Visit: Payer: Self-pay | Admitting: Internal Medicine

## 2020-02-01 ENCOUNTER — Telehealth: Payer: Self-pay

## 2020-02-01 NOTE — Telephone Encounter (Signed)
Triage nurse tried to contact pt. Pt didn't didn't answer vm was left

## 2020-02-08 ENCOUNTER — Other Ambulatory Visit: Payer: Self-pay

## 2020-02-08 ENCOUNTER — Ambulatory Visit (INDEPENDENT_AMBULATORY_CARE_PROVIDER_SITE_OTHER): Payer: Medicare Other | Admitting: Podiatry

## 2020-02-08 ENCOUNTER — Encounter: Payer: Self-pay | Admitting: Podiatry

## 2020-02-08 DIAGNOSIS — M2012 Hallux valgus (acquired), left foot: Secondary | ICD-10-CM

## 2020-02-08 DIAGNOSIS — M2041 Other hammer toe(s) (acquired), right foot: Secondary | ICD-10-CM

## 2020-02-08 DIAGNOSIS — B351 Tinea unguium: Secondary | ICD-10-CM

## 2020-02-08 DIAGNOSIS — M2042 Other hammer toe(s) (acquired), left foot: Secondary | ICD-10-CM

## 2020-02-08 DIAGNOSIS — M2011 Hallux valgus (acquired), right foot: Secondary | ICD-10-CM

## 2020-02-08 DIAGNOSIS — M79675 Pain in left toe(s): Secondary | ICD-10-CM | POA: Diagnosis not present

## 2020-02-08 DIAGNOSIS — M79674 Pain in right toe(s): Secondary | ICD-10-CM

## 2020-02-09 ENCOUNTER — Ambulatory Visit: Payer: Medicare Other | Attending: Family Medicine

## 2020-02-09 DIAGNOSIS — E876 Hypokalemia: Secondary | ICD-10-CM

## 2020-02-09 NOTE — Progress Notes (Signed)
Subjective:  Patient ID: Anna Mcguire, female    DOB: 09/05/1955,  MRN: [id number]  Anna Mcguire presents to clinic today for painful thick toenails that are difficult to trim. Pain interferes with ambulation. Aggravating factors include wearing enclosed shoe gear. Pain is relieved with periodic professional debridement..  64 y.o. female presents with the above complaint.    Review of Systems: Negative except as noted in the HPI. Past Medical History:  Diagnosis Date  . Acid reflux   . Arthritis   . Hypertension    Past Surgical History:  Procedure Laterality Date  . COLONOSCOPY WITH PROPOFOL N/A 06/07/2014   Procedure: COLONOSCOPY WITH PROPOFOL;  Surgeon: Willis Modena, MD;  Location: [facility];  Service: Endoscopy;  Laterality: N/A;  . ESOPHAGOGASTRODUODENOSCOPY (EGD) WITH PROPOFOL N/A 06/07/2014   Procedure: ESOPHAGOGASTRODUODENOSCOPY (EGD) WITH PROPOFOL;  Surgeon: Willis Modena, MD;  Location: [facility];  Service: Endoscopy;  Laterality: N/A;    Current Outpatient Medications:  .  albuterol (PROVENTIL) (2.5 MG/3ML) 0.083% nebulizer solution, Take 3 mLs (2.5 mg total) by nebulization every 6 (six) hours as needed for wheezing or shortness of breath., Disp: 150 mL, Rfl: 1 .  albuterol (VENTOLIN HFA) 108 (90 Base) MCG/ACT inhaler, Inhale 2 puffs into the lungs every 6 (six) hours as needed for wheezing or shortness of breath., Disp: 18 g, Rfl: 0 .  albuterol (VENTOLIN HFA) 108 (90 Base) MCG/ACT inhaler, Inhale 2 puffs into the lungs every 6 (six) hours as needed for wheezing or shortness of breath., Disp: 18 g, Rfl: 0 .  amLODipine (NORVASC) 10 MG tablet, Take 1 tablet (10 mg total) by mouth daily. To lower blood pressure, Disp: 90 tablet, Rfl: 1 .  aspirin EC 81 MG tablet, Take 1 tablet (81 mg total) by mouth daily., Disp: 60 tablet, Rfl: 0 .  atorvastatin (LIPITOR) 80 MG tablet, Take 1 tablet (80 mg total) by mouth daily., Disp: 90 tablet, Rfl: 1 .   fluconazole (DIFLUCAN) 150 MG tablet, Take 150 mg by mouth once., Disp: , Rfl:  .  folic acid (FOLVITE) 1 MG tablet, Take 1 tablet (1 mg total) by mouth daily., Disp: 30 tablet, Rfl: 3 .  hydrochlorothiazide (HYDRODIURIL) 25 MG tablet, Take 25 mg by mouth daily., Disp: , Rfl:  .  hydrOXYzine (ATARAX/VISTARIL) 10 MG tablet, Take 1 tablet (10 mg total) by mouth 3 (three) times daily as needed., Disp: 30 tablet, Rfl: 0 .  lisinopril (ZESTRIL) 5 MG tablet, Take 1 tablet (5 mg total) by mouth daily., Disp: 30 tablet, Rfl: 4 .  loratadine (CLARITIN) 10 MG tablet, Take 1 tablet (10 mg total) by mouth daily., Disp: 90 tablet, Rfl: 1 .  meloxicam (MOBIC) 15 MG tablet, TAKE 1 TABLET(15 MG) BY MOUTH DAILY AFTER A MEAL AS NEEDED FOR JOINT PAIN (Patient taking differently: Take 15 mg by mouth daily. ), Disp: 90 tablet, Rfl: 0 .  nicotine (NICODERM CQ - DOSED IN MG/24 HOURS) 21 mg/24hr patch, Place 1 patch (21 mg total) onto the skin daily., Disp: 28 patch, Rfl: 1 .  ondansetron (ZOFRAN) 4 MG tablet, Take 1 tablet (4 mg total) by mouth every 6 (six) hours as needed for nausea. (Patient not taking: Reported on 01/30/2020), Disp: 20 tablet, Rfl: 0 .  pantoprazole (PROTONIX) 20 MG tablet, Take 1 tablet (20 mg total) by mouth daily., Disp: 30 tablet, Rfl: 0 .  potassium chloride (KLOR-CON) 8 MEQ tablet, Take 2 tablets (16 mEq total) by mouth daily., Disp: 6 tablet, Rfl: 0 .  thiamine 100 MG tablet, Take 1 tablet (100 mg total) by mouth daily., Disp: 30 tablet, Rfl: 0 .  tiotropium (SPIRIVA HANDIHALER) 18 MCG inhalation capsule, Place 1 capsule (18 mcg total) into inhaler and inhale daily., Disp: 30 capsule, Rfl: 12 Allergies  Allergen Reactions  . Penicillins Diarrhea   Social History   Occupational History  . Not on file  Tobacco Use  . Smoking status: Current Every Day Smoker  . Smokeless tobacco: Never Used  Substance and Sexual Activity  . Alcohol use: Yes    Comment: occ  . Drug use: Yes    Types:  Marijuana  . Sexual activity: Not Currently    Objective:   Constitutional Anna Mcguire is a pleasant 64 y.o. African American female, in NAD.Marland Kitchen AAO x 3.   Vascular Capillary refill time to digits immediate b/l. Palpable pedal pulses b/l LE. Pedal hair sparse. Lower extremity skin temperature gradient within normal limits. No pain with calf compression b/l. No edema noted b/l lower extremities.  No cyanosis or clubbing noted.  Neurologic Normal speech. Oriented to person, place, and time. Epicritic sensation to light touch grossly present bilaterally. Protective sensation intact 5/5 intact bilaterally with 10g monofilament b/l. Vibratory sensation intact b/l. Proprioception intact bilaterally.  Dermatologic Pedal skin with normal turgor, texture and tone bilaterally. No open wounds bilaterally. No interdigital macerations bilaterally. Toenails 1-5 b/l elongated, discolored, dystrophic, thickened, crumbly with subungual debris and tenderness to dorsal palpation.  Orthopedic: Normal muscle strength 5/5 to all lower extremity muscle groups bilaterally. No pain crepitus or joint limitation noted with ROM b/l. Hallux valgus with bunion deformity noted b/l lower extremities. Hammertoes noted to the b/l lower extremities.   Radiographs: None Assessment:   1. Pain due to onychomycosis of toenails of both feet   2. Hallux valgus, acquired, bilateral   3. Hammer toes of both feet    Plan:  Patient was evaluated and treated and all questions answered.  Onychomycosis with pain -Nails palliatively debridement as below -Educated on self-care  Procedure: Nail Debridement Rationale: Pain Type of Debridement: manual, sharp debridement. Instrumentation: Nail nipper, rotary burr. Number of Nails: 10 -Examined patient. -No new findings. No new orders. -Toenails 1-5 b/l were debrided in length and girth with sterile nail nippers and dremel without iatrogenic bleeding.  -Patient to report any  pedal injuries to medical professional immediately. -Patient to continue soft, supportive shoe gear daily. -Patient/POA to call should there be question/concern in the interim.  Return in about 3 months (around 05/10/2020) for nail trim.  Freddie Breech, DPM

## 2020-02-10 ENCOUNTER — Telehealth: Payer: Self-pay | Admitting: Family Medicine

## 2020-02-10 ENCOUNTER — Telehealth: Payer: Self-pay

## 2020-02-10 LAB — POTASSIUM: Potassium: 5 mmol/L (ref 3.5–5.2)

## 2020-02-10 NOTE — Telephone Encounter (Signed)
Contacted pt to go over lab results pt is aware and doesn't have any questions or concerns 

## 2020-02-10 NOTE — Telephone Encounter (Signed)
Copied from CRM [id number]. Topic: General - Other >> Feb 06, 2020 10:28 AM Elliot Gault wrote: Reason for CRM: patient returning call regarding a "breathing treatment" chart does not reflect. Scheduled patient as per lab results for potassium recheck

## 2020-02-15 ENCOUNTER — Other Ambulatory Visit: Payer: Self-pay | Admitting: Family Medicine

## 2020-02-15 DIAGNOSIS — M159 Polyosteoarthritis, unspecified: Secondary | ICD-10-CM

## 2020-02-22 ENCOUNTER — Ambulatory Visit: Payer: Self-pay

## 2020-02-22 ENCOUNTER — Ambulatory Visit (INDEPENDENT_AMBULATORY_CARE_PROVIDER_SITE_OTHER): Payer: Medicare Other

## 2020-02-22 ENCOUNTER — Encounter (HOSPITAL_COMMUNITY): Payer: Self-pay

## 2020-02-22 ENCOUNTER — Ambulatory Visit (HOSPITAL_COMMUNITY)
Admission: EM | Admit: 2020-02-22 | Discharge: 2020-02-22 | Disposition: A | Discharge status: Home / Self Care | Payer: Medicare Other | Attending: Family Medicine | Admitting: Family Medicine

## 2020-02-22 ENCOUNTER — Other Ambulatory Visit: Payer: Self-pay

## 2020-02-22 DIAGNOSIS — M25562 Pain in left knee: Secondary | ICD-10-CM | POA: Diagnosis not present

## 2020-02-22 DIAGNOSIS — W19XXXA Unspecified fall, initial encounter: Secondary | ICD-10-CM | POA: Diagnosis not present

## 2020-02-22 DIAGNOSIS — M159 Polyosteoarthritis, unspecified: Secondary | ICD-10-CM

## 2020-02-22 DIAGNOSIS — M25462 Effusion, left knee: Secondary | ICD-10-CM

## 2020-02-22 MED ORDER — MELOXICAM 15 MG PO TABS
15.0000 mg | ORAL_TABLET | Freq: Every day | ORAL | 0 refills | Status: DC
Start: 2020-02-22 — End: 2020-04-14

## 2020-02-22 NOTE — Telephone Encounter (Signed)
Pt. Reports she fell at home last night moving a chair. Fell on her left knee. Knee is swollen and bruised. Hurts to walk on it. No availability in the practice. Pt. Will go to UC.  Reason for Disposition . MILD weakness (i.e., does not interfere with ability to work, go to school, normal activities) (Exception: mild weakness is a chronic symptom)  Answer Assessment - Initial Assessment Questions 1. MECHANISM: "How did the fall happen?"     Moving a chair 2. DOMESTIC VIOLENCE AND ELDER ABUSE SCREENING: "Did you fall because someone pushed you or tried to hurt you?" If Yes, ask: "Are you safe now?"     No 3. ONSET: "When did the fall happen?" (e.g., minutes, hours, or days ago)     Last night 4. LOCATION: "What part of the body hit the ground?" (e.g., back, buttocks, head, hips, knees, hands, head, stomach)     Left knee and elbow 5. INJURY: "Did you hurt (injure) yourself when you fell?" If Yes, ask: "What did you injure? Tell me more about this?" (e.g., body area; type of injury; pain severity)"     Yes 6. PAIN: "Is there any pain?" If Yes, ask: "How bad is the pain?" (e.g., Scale 1-10; or mild,  moderate, severe)   - NONE (0): no pain   - MILD (1-3): doesn't interfere with normal activities    - MODERATE (4-7): interferes with normal activities or awakens from sleep    - SEVERE (8-10): excruciating pain, unable to do any normal activities      8 7. SIZE: For cuts, bruises, or swelling, ask: "How large is it?" (e.g., inches or centimeters)      No 8. PREGNANCY: "Is there any chance you are pregnant?" "When was your last menstrual period?"     No 9. OTHER SYMPTOMS: "Do you have any other symptoms?" (e.g., dizziness, fever, weakness; new onset or worsening).      No 10. CAUSE: "What do you think caused the fall (or falling)?" (e.g., tripped, dizzy spell)       Tripped  Protocols used: FALLS AND FALLING-A-AH

## 2020-02-22 NOTE — ED Provider Notes (Signed)
[facility]    CSN: [id number] Arrival date & time: 02/22/20  0957      History   Chief Complaint Chief Complaint  Patient presents with  . Knee Pain  . Fall    HPI Anna Mcguire is a 64 y.o. female.   Anna Mcguire presents with complaints of left knee pain. Last evening while pushing a chair, she fell forward, landed on the floor- linoleum. Landed on left knee and left elbow.  Ultimately was able to get up and has been ambulating. Today pain is worse. No previous injury, history of arthritis to left knee. Hasn't taken any medications for pain.    ROS per HPI, negative if not otherwise mentioned.      Past Medical History:  Diagnosis Date  . Acid reflux   . Arthritis   . Hypertension     Patient Active Problem List   Diagnosis Date Noted  . Alcohol use disorder, moderate, dependence (HCC) 01/30/2020  . 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  . Tobacco dependence 01/30/2020  . Diarrhea of infectious origin 01/30/2020  . Hyponatremia 01/13/2020  . Alcohol abuse 01/13/2020  . Tobacco abuse 01/13/2020  . Chronic diarrhea 01/13/2020  . Hypophosphatemia 01/13/2020  . Hypertension   . Chronic obstructive pulmonary disease (HCC)   . Alcohol use   . Hypokalemia 01/12/2020  . Tobacco use 10/06/2019  . Primary osteoarthritis of left knee 09/08/2019  . Pain in right shoulder 08/11/2019    Past Surgical History:  Procedure Laterality Date  . COLONOSCOPY WITH PROPOFOL N/A 06/07/2014   Procedure: COLONOSCOPY WITH PROPOFOL;  Surgeon: Willis ModenaWilliam Outlaw, MD;  Location: [facility];  Service: Endoscopy;  Laterality: N/A;  . ESOPHAGOGASTRODUODENOSCOPY (EGD) WITH PROPOFOL N/A 06/07/2014   Procedure: ESOPHAGOGASTRODUODENOSCOPY (EGD) WITH PROPOFOL;  Surgeon: Willis ModenaWilliam Outlaw, MD;  Location: [facility];  Service: Endoscopy;  Laterality: N/A;    OB History   No obstetric history on file.      Home Medications    Prior  to Admission medications   Medication Sig Start Date End Date Taking? Authorizing Provider  albuterol (PROVENTIL) (2.5 MG/3ML) 0.083% nebulizer solution Take 3 mLs (2.5 mg total) by nebulization every 6 (six) hours as needed for wheezing or shortness of breath. 01/30/20   Marcine MatarJohnson, Deborah B, MD  albuterol (VENTOLIN HFA) 108 (90 Base) MCG/ACT inhaler Inhale 2 puffs into the lungs every 6 (six) hours as needed for wheezing or shortness of breath. 01/17/20   Drema DallasWoods, Curtis J, MD  albuterol (VENTOLIN HFA) 108 (90 Base) MCG/ACT inhaler Inhale 2 puffs into the lungs every 6 (six) hours as needed for wheezing or shortness of breath. 01/17/20   Drema DallasWoods, Curtis J, MD  amLODipine (NORVASC) 10 MG tablet Take 1 tablet (10 mg total) by mouth daily. To lower blood pressure 01/30/20   Marcine MatarJohnson, Deborah B, MD  aspirin EC 81 MG tablet Take 1 tablet (81 mg total) by mouth daily. 05/12/19   Wieters, Hallie C, PA-C  atorvastatin (LIPITOR) 80 MG tablet Take 1 tablet (80 mg total) by mouth daily. 06/24/19   Fulp, Cammie, MD  fluconazole (DIFLUCAN) 150 MG tablet Take 150 mg by mouth once. 01/30/20   [provider]  folic acid (FOLVITE) 1 MG tablet Take 1 tablet (1 mg total) by mouth daily. 01/30/20   Marcine MatarJohnson, Deborah B, MD  hydrochlorothiazide (HYDRODIURIL) 25 MG tablet Take 25 mg by mouth daily. 01/25/20   [provider]  hydrOXYzine (ATARAX/VISTARIL) 10 MG tablet Take 1  tablet (10 mg total) by mouth 3 (three) times daily as needed. 01/30/20   Marcine Matar, MD  lisinopril (ZESTRIL) 5 MG tablet Take 1 tablet (5 mg total) by mouth daily. 01/30/20   Marcine Matar, MD  loratadine (CLARITIN) 10 MG tablet Take 1 tablet (10 mg total) by mouth daily. 06/24/19   Fulp, Cammie, MD  meloxicam (MOBIC) 15 MG tablet Take 1 tablet (15 mg total) by mouth daily. 02/22/20   Georgetta Haber, NP  nicotine (NICODERM CQ - DOSED IN MG/24 HOURS) 21 mg/24hr patch Place 1 patch (21 mg total) onto the skin daily. 01/30/20   Marcine Matar, MD  ondansetron (ZOFRAN) 4 MG tablet Take 1 tablet (4 mg total) by mouth every 6 (six) hours as needed for nausea. Patient not taking: Reported on 01/30/2020 01/17/20   Drema Dallas, MD  pantoprazole (PROTONIX) 20 MG tablet Take 1 tablet (20 mg total) by mouth daily. 01/18/20   Drema Dallas, MD  potassium chloride (KLOR-CON) 8 MEQ tablet Take 2 tablets (16 mEq total) by mouth daily. 01/31/20   Marcine Matar, MD  thiamine 100 MG tablet Take 1 tablet (100 mg total) by mouth daily. 01/18/20   Drema Dallas, MD  tiotropium (SPIRIVA HANDIHALER) 18 MCG inhalation capsule Place 1 capsule (18 mcg total) into inhaler and inhale daily. 01/30/20   Marcine Matar, MD  fluticasone (FLONASE) 50 MCG/ACT nasal spray Place 1 spray into both nostrils daily.  05/12/19  [provider]  lisinopril-hydrochlorothiazide (PRINZIDE,ZESTORETIC) 20-12.5 MG per tablet Take 1 tablet by mouth daily.  05/12/19  [provider]  lovastatin (MEVACOR) 20 MG tablet Take 20 mg by mouth daily at 6 PM.  05/12/19  [provider]    Family History Family History  Problem Relation Age of Onset  . Kidney failure Mother   . Aneurysm Father     Social History Social History   Tobacco Use  . Smoking status: Current Every Day Smoker  . Smokeless tobacco: Never Used  Substance Use Topics  . Alcohol use: Yes    Comment: occ  . Drug use: Yes    Types: Marijuana     Allergies   Penicillins   Review of Systems Review of Systems   Physical Exam Triage Vital Signs ED Triage Vitals  Enc Vitals Group     BP 02/22/20 1040 129/72     Pulse Rate 02/22/20 1040 82     Resp 02/22/20 1040 18     Temp 02/22/20 1040 98.4 F (36.9 C)     Temp Source 02/22/20 1040 Oral     SpO2 02/22/20 1040 100 %     Weight --      Height --      Head Circumference --      Peak Flow --      Pain Score 02/22/20 1038 8     Pain Loc --      Pain Edu? --      Excl. in GC? --    No data  found.  Updated Vital Signs BP 129/72 (BP Location: Right Arm)   Pulse 82   Temp 98.4 F (36.9 C) (Oral)   Resp 18   SpO2 100%   Visual Acuity Right Eye Distance:   Left Eye Distance:   Bilateral Distance:    Right Eye Near:   Left Eye Near:    Bilateral Near:     Physical Exam Constitutional:  General: She is not in acute distress.    Appearance: She is well-developed.  Cardiovascular:     Rate and Rhythm: Normal rate.  Pulmonary:     Effort: Pulmonary effort is normal.  Musculoskeletal:     Left elbow: No swelling. Normal range of motion. Tenderness present.     Left knee: Swelling, effusion and bony tenderness present. No ecchymosis or lacerations. Tenderness present over the medial joint line, lateral joint line and patellar tendon.     Comments: Generalized tenderness to knee with large effusion noted; no bruising; pain and limitation with flexion and extension; very mild left elbow tenderness without limitation to ROM bruising or swelling  Skin:    General: Skin is warm and dry.  Neurological:     Mental Status: She is alert and oriented to person, place, and time.      UC Treatments / Results  Labs (all labs ordered are listed, but only abnormal results are displayed) Labs Reviewed - No data to display  EKG   Radiology DG Knee Complete 4 Views Left  Result Date: 02/22/2020 CLINICAL DATA:  Pain following fall EXAM: LEFT KNEE - COMPLETE 4+ VIEW COMPARISON:  September 08, 2019 FINDINGS: Frontal, lateral, and bilateral oblique views were obtained. There is no acute fracture or dislocation. There is a large joint effusion. There is moderate generalized osteoarthritic change. There is sclerosis along the lateral distal femoral condyle and lateral tibial plateau region which is stable and may reflect residua of prior trauma with chronic arthropathy. A small focus of chondroid matrix calcification in the proximal tibial diaphysis is stable, measuring 1.3 x 1.2 cm.  Suspect small bone infarct in this area. IMPRESSION: Large knee joint effusion present. No acute appearing fracture or dislocation. Diffuse joint space narrowing and spurring. Sclerosis along the distal femoral condyle and lateral tibial plateau may reflect chronic impaction type changes from extensive arthropathy. Appearance stable. 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 Electronically Signed   By: Bretta Bang III M.D.   On: 02/22/2020 11:40    Procedures Procedures (including critical care time)  Medications Ordered in UC Medications - No data to display  Initial Impression / Assessment and Plan / UC Course  I have reviewed the triage vital signs and the nursing notes.  Pertinent labs & imaging results that were available during my care of the patient were reviewed by me and considered in my medical decision making (see chart for details).     Large effusion to left knee s/p fall. No fracture. Ace wrap and knee sleeve provided. Pain management discussed. Encouraged follow up with orthopedics for further evaluation and management. Patient verbalized understanding and agreeable to plan.    Final Clinical Impressions(s) / UC Diagnoses   Final diagnoses:  Fall, initial encounter  Effusion of left knee     Discharge Instructions     There are no fractures/ broken bones on your xray.  I would like you to follow up with orthopedics for recheck of your knee.  Use of ace wrap as well as knee sleeve to help with swelling and fluid, this may need to be drained by orthopedics.  Meloxicam daily. Don't take additional ibuprofen for aleve. Take with food.  Ice application.     ED Prescriptions    Medication Sig Dispense Auth. Provider   meloxicam (MOBIC) 15 MG tablet Take 1 tablet (15 mg total) by mouth daily. 30 tablet Georgetta Haber, NP     PDMP not reviewed  this encounter.   Georgetta Haber, NP 02/22/20 1205

## 2020-02-22 NOTE — Telephone Encounter (Signed)
FYI for documentation

## 2020-02-22 NOTE — ED Triage Notes (Signed)
Pt presents with swelling and pain in the left knee since last night. States she was pushing a chair and fell forward over er left knee.

## 2020-02-22 NOTE — Discharge Instructions (Addendum)
There are no fractures/ broken bones on your xray.  I would like you to follow up with orthopedics for recheck of your knee.  Use of ace wrap as well as knee sleeve to help with swelling and fluid, this may need to be drained by orthopedics.  Meloxicam daily. Don't take additional ibuprofen for aleve. Take with food.  Ice application.

## 2020-02-24 ENCOUNTER — Other Ambulatory Visit: Payer: Self-pay | Admitting: Family Medicine

## 2020-02-24 DIAGNOSIS — K219 Gastro-esophageal reflux disease without esophagitis: Secondary | ICD-10-CM

## 2020-03-19 ENCOUNTER — Other Ambulatory Visit: Payer: Self-pay | Admitting: Family Medicine

## 2020-03-19 DIAGNOSIS — J309 Allergic rhinitis, unspecified: Secondary | ICD-10-CM

## 2020-03-19 DIAGNOSIS — E785 Hyperlipidemia, unspecified: Secondary | ICD-10-CM

## 2020-03-19 MED ORDER — ATORVASTATIN CALCIUM 80 MG PO TABS: 80.0000 mg | ORAL_TABLET | Freq: Every day | ORAL | 2 refills | Status: DC

## 2020-03-19 MED ORDER — LORATADINE 10 MG PO TABS: 10.0000 mg | ORAL_TABLET | Freq: Every day | ORAL | 2 refills | Status: DC

## 2020-03-19 NOTE — Telephone Encounter (Signed)
Medication: atorvastatin (LIPITOR) 80 MG tablet [[id number]] , albuterol (VENTOLIN HFA) 108 (90 Base) MCG/ACT inhaler [[id number]] , besylate, hydrochlorothiazide (HYDRODIURIL) 25 MG tablet [[id number]] , loratadine (CLARITIN) 10 MG tablet [[id number]]   Has the patient contacted their pharmacy? YES (Agent: If no, request that the patient contact the pharmacy for the refill.) (Agent: If yes, when and what did the pharmacy advise?)  Preferred Pharmacy (with phone number or street name): [facility] [phone] - [place], [place] - [address] AT [facility] [address] & [address] [place] [address] Phone: [phone] Fax: [phone] Hours: Not open 24 hours    Agent: Please be advised that RX refills may take up to 3 business days. We ask that you follow-up with your pharmacy.

## 2020-03-21 ENCOUNTER — Other Ambulatory Visit: Payer: Self-pay

## 2020-03-21 ENCOUNTER — Encounter: Payer: Self-pay | Admitting: Emergency Medicine

## 2020-03-21 ENCOUNTER — Ambulatory Visit (INDEPENDENT_AMBULATORY_CARE_PROVIDER_SITE_OTHER): Payer: Medicare Other | Admitting: Emergency Medicine

## 2020-03-21 DIAGNOSIS — J449 Chronic obstructive pulmonary disease, unspecified: Secondary | ICD-10-CM | POA: Diagnosis not present

## 2020-03-21 DIAGNOSIS — R053 Chronic cough: Secondary | ICD-10-CM

## 2020-03-21 DIAGNOSIS — R05 Cough: Secondary | ICD-10-CM | POA: Diagnosis not present

## 2020-03-21 DIAGNOSIS — Z720 Tobacco use: Secondary | ICD-10-CM

## 2020-03-21 MED ORDER — TIOTROPIUM BROMIDE MONOHYDRATE 18 MCG IN CAPS
18.0000 ug | ORAL_CAPSULE | Freq: Every day | RESPIRATORY_TRACT | 5 refills | Status: DC
Start: 2020-03-21 — End: 2021-05-08

## 2020-03-21 MED ORDER — PANTOPRAZOLE SODIUM 40 MG PO TBEC: 40.0000 mg | DELAYED_RELEASE_TABLET | Freq: Every day | ORAL | 5 refills | Status: DC

## 2020-03-21 MED ORDER — IRBESARTAN 75 MG PO TABS
75.0000 mg | ORAL_TABLET | Freq: Every day | ORAL | 5 refills | Status: DC
Start: 2020-03-21 — End: 2020-08-23

## 2020-03-21 NOTE — Assessment & Plan Note (Signed)
I am glad that you want to decrease your smoking.  Agree with getting nicotine patches to help with this.  We can talk about setting a quit date and stopping altogether next time.

## 2020-03-21 NOTE — Addendum Note (Signed)
Addended by: Christen Butter on: 03/21/2020 12:18 PM   Modules accepted: Orders

## 2020-03-21 NOTE — Progress Notes (Signed)
Subjective:    Patient ID: Anna Mcguire, female    DOB: July 22, 1955, 64 y.o.   MRN: [id number]  HPI 64 yo active smoker (25+ pack years) with a history of GERD, HTN, arthritis, alcohol use, chronic rhinitis with associated cough.  She carries a presumed diagnosis of COPD, has been treated as an outpatient for acute exacerbations and was admitted 01/12/2020 for acute exacerbation as well as apparent SIADH with associated hyponatremia.  She is on lisinopril for her hypertension. Loratadine for rhinitis.  Formally on a PPI but not currently.  Has albuterol and atrovent since hospitalization.  She uses prn. She doesn;t believe she has Spiriva although its on her med list.  She is smoking about 5 cig a day.   She reports progressive exertional SOB going back 6-9 months. Can happen at rest as well. She has daily cough that will often flare and then makes her breathing decompensate. Her cough produces white mucous. She reports severe GERD, not currently on PPI.   CXR 11/23/19 reviewed, shows hyperinflation, some chronic bronchitic changes   Review of Systems As per hpi  Past Medical History:  Diagnosis Date  . Acid reflux   . Arthritis   . Hypertension      Family History  Problem Relation Age of Onset  . Kidney failure Mother   . Aneurysm Father      Social History   Socioeconomic History  . Marital status: Single    Spouse name: Not on file  . Number of children: Not on file  . Years of education: Not on file  . Highest education level: Not on file  Occupational History  . Not on file  Tobacco Use  . Smoking status: Current Every Day Smoker    Packs/day: 0.50    Years: 48.00    Pack years: 24.00    Types: Cigarettes  . Smokeless tobacco: Never Used  Substance and Sexual Activity  . Alcohol use: Yes    Comment: occ  . Drug use: Yes    Types: Marijuana  . Sexual activity: Not Currently  Other Topics Concern  . Not on file  Social History Narrative  . Not on file     Social Determinants of Health   Financial Resource Strain:   . Difficulty of Paying Living Expenses: Not on file  Food Insecurity:   . Worried About Programme researcher, broadcasting/film/video in the Last Year: Not on file  . Ran Out of Food in the Last Year: Not on file  Transportation Needs:   . Lack of Transportation (Medical): Not on file  . Lack of Transportation (Non-Medical): Not on file  Physical Activity:   . Days of Exercise per Week: Not on file  . Minutes of Exercise per Session: Not on file  Stress:   . Feeling of Stress : Not on file  Social Connections:   . Frequency of Communication with Friends and Family: Not on file  . Frequency of Social Gatherings with Friends and Family: Not on file  . Attends Religious Services: Not on file  . Active Member of Clubs or Organizations: Not on file  . Attends Banker Meetings: Not on file  . Marital Status: Not on file  Intimate Partner Violence:   . Fear of Current or Ex-Partner: Not on file  . Emotionally Abused: Not on file  . Physically Abused: Not on file  . Sexually Abused: Not on file     Allergies  Allergen Reactions  .  Penicillins Diarrhea     Outpatient Medications Prior to Visit  Medication Sig Dispense Refill  . albuterol (VENTOLIN HFA) 108 (90 Base) MCG/ACT inhaler Inhale 2 puffs into the lungs every 6 (six) hours as needed for wheezing or shortness of breath. 18 g 0  . amLODipine (NORVASC) 10 MG tablet Take 1 tablet (10 mg total) by mouth daily. To lower blood pressure 90 tablet 1  . atorvastatin (LIPITOR) 80 MG tablet Take 1 tablet (80 mg total) by mouth daily. 90 tablet 2  . folic acid (FOLVITE) 1 MG tablet Take 1 tablet (1 mg total) by mouth daily. 30 tablet 3  . lisinopril (ZESTRIL) 5 MG tablet Take 1 tablet (5 mg total) by mouth daily. 30 tablet 4  . loratadine (CLARITIN) 10 MG tablet Take 1 tablet (10 mg total) by mouth daily. 90 tablet 2  . thiamine 100 MG tablet Take 1 tablet (100 mg total) by mouth daily.  30 tablet 0  . tiotropium (SPIRIVA HANDIHALER) 18 MCG inhalation capsule Place 1 capsule (18 mcg total) into inhaler and inhale daily. 30 capsule 12  . aspirin EC 81 MG tablet Take 1 tablet (81 mg total) by mouth daily. 60 tablet 0  . albuterol (PROVENTIL) (2.5 MG/3ML) 0.083% nebulizer solution Take 3 mLs (2.5 mg total) by nebulization every 6 (six) hours as needed for wheezing or shortness of breath. (Patient not taking: Reported on 03/21/2020) 150 mL 1  . meloxicam (MOBIC) 15 MG tablet Take 1 tablet (15 mg total) by mouth daily. (Patient not taking: Reported on 03/21/2020) 30 tablet 0  . nicotine (NICODERM CQ - DOSED IN MG/24 HOURS) 21 mg/24hr patch Place 1 patch (21 mg total) onto the skin daily. (Patient not taking: Reported on 03/21/2020) 28 patch 1  . albuterol (VENTOLIN HFA) 108 (90 Base) MCG/ACT inhaler Inhale 2 puffs into the lungs every 6 (six) hours as needed for wheezing or shortness of breath. 18 g 0  . fluconazole (DIFLUCAN) 150 MG tablet Take 150 mg by mouth once.    . hydrochlorothiazide (HYDRODIURIL) 25 MG tablet Take 25 mg by mouth daily.    . hydrOXYzine (ATARAX/VISTARIL) 10 MG tablet Take 1 tablet (10 mg total) by mouth 3 (three) times daily as needed. 30 tablet 0  . ondansetron (ZOFRAN) 4 MG tablet Take 1 tablet (4 mg total) by mouth every 6 (six) hours as needed for nausea. (Patient not taking: Reported on 01/30/2020) 20 tablet 0  . pantoprazole (PROTONIX) 20 MG tablet Take 1 tablet (20 mg total) by mouth daily. 30 tablet 0  . potassium chloride (KLOR-CON) 8 MEQ tablet Take 2 tablets (16 mEq total) by mouth daily. 6 tablet 0   No facility-administered medications prior to visit.        Objective:   Physical Exam Vitals:   03/21/20 1146  BP: 120/60  Pulse: 80  SpO2: 100%  Weight: 117 lb (53.1 kg)  Height: 5\' 7"  (1.702 m)   Gen: Thin elderly woman, in no distress,  normal affect, occasional cough  ENT: No lesions,  mouth clear,  oropharynx clear, no postnasal drip, very  hoarse  Neck: No JV, very hoarse voiceD, no stridor  Lungs: No use of accessory muscles, no crackles or wheezing on normal respiration, she does wheeze on forced expiration  Cardiovascular: RRR, heart sounds normal, no murmur or gallops, no peripheral edema  Musculoskeletal: No deformities, no cyanosis or clubbing  Neuro: alert, awake, non focal  Skin: Warm, no lesions or rash  Assessment & Plan:  Tobacco use I am glad that you want to decrease your smoking.  Agree with getting nicotine patches to help with this.  We can talk about setting a quit date and stopping altogether next time.   Chronic obstructive pulmonary disease (HCC) She somehow got off of it, clearly needs maintenance therapy.  She also need smoking cessation.  She can use Ventolin as a rescue inhaler, stop the Atrovent.  She needs full PFT.  We will restart Spiriva 1 inhalation once daily.  This is a maintenance inhaler that you take every single day to keep your breathing stable. Continue Ventolin (albuterol) 2 puffs if you need it.  You can take this up to every 4 hours if needed for shortness of breath, chest tightness, wheezing.  This is your rescue inhaler. Stop Atrovent (ipratropium) inhaler. Follow with Dr. Delton Coombes next available with full pulmonary function testing on the same day.  Chronic cough Please stop lisinopril (blood pressure pill) We will start a substitute blood pressure pill called irbesartan 75 mg once daily. Restart pantoprazole 40 mg once a day.  Take this medication 1 hour around food.  Levy Pupa, MD, PhD 03/21/2020, 12:05 PM [facility] [phone] or if no answer [phone]

## 2020-03-21 NOTE — Patient Instructions (Addendum)
I am glad that you want to decrease your smoking.  Agree with getting nicotine patches to help with this.  We can talk about setting a quit date and stopping altogether next time. We will restart Spiriva 1 inhalation once daily.  This is a maintenance inhaler that you take every single day to keep your breathing stable. Continue Ventolin (albuterol) 2 puffs if you need it.  You can take this up to every 4 hours if needed for shortness of breath, chest tightness, wheezing.  This is your rescue inhaler. Stop Atrovent (ipratropium) inhaler. Please stop lisinopril (blood pressure pill) We will start a substitute blood pressure pill called irbesartan 75 mg once daily. Restart pantoprazole 40 mg once a day.  Take this medication 1 hour around food. Follow with Dr. Delton Coombes next available with full pulmonary function testing on the same day.

## 2020-03-21 NOTE — Assessment & Plan Note (Signed)
Please stop lisinopril (blood pressure pill) We will start a substitute blood pressure pill called irbesartan 75 mg once daily. Restart pantoprazole 40 mg once a day.  Take this medication 1 hour around food.

## 2020-03-21 NOTE — Assessment & Plan Note (Signed)
She somehow got off of it, clearly needs maintenance therapy.  She also need smoking cessation.  She can use Ventolin as a rescue inhaler, stop the Atrovent.  She needs full PFT.  We will restart Spiriva 1 inhalation once daily.  This is a maintenance inhaler that you take every single day to keep your breathing stable. Continue Ventolin (albuterol) 2 puffs if you need it.  You can take this up to every 4 hours if needed for shortness of breath, chest tightness, wheezing.  This is your rescue inhaler. Stop Atrovent (ipratropium) inhaler. Follow with Dr. Delton Coombes next available with full pulmonary function testing on the same day.

## 2020-03-26 ENCOUNTER — Telehealth: Payer: Self-pay | Admitting: Emergency Medicine

## 2020-03-26 MED ORDER — NICOTINE 7 MG/24HR TD PT24: 7.0000 mg | MEDICATED_PATCH | Freq: Every day | TRANSDERMAL | 1 refills | Status: DC

## 2020-03-26 NOTE — Telephone Encounter (Signed)
Spoke with the pt  She states that she has cut back to 5 cigs per day  Wants rx for nicotine patches  Please advise what strength to call in for her, thanks!

## 2020-03-26 NOTE — Telephone Encounter (Signed)
Spoke with the pt and notified of response per RB  She verbalized understanding  Rx was sent to pharm for patches

## 2020-03-26 NOTE — Telephone Encounter (Signed)
At 5 cig a day, she should go with 7mg  patches. We can write a script, but she can also get OTC. It is ok to use some nicotine gum, lozenges while on this dose of the patch

## 2020-04-13 ENCOUNTER — Ambulatory Visit: Payer: Medicare Other | Attending: Internal Medicine | Admitting: Internal Medicine

## 2020-04-13 ENCOUNTER — Encounter: Payer: Self-pay | Admitting: Internal Medicine

## 2020-04-13 ENCOUNTER — Ambulatory Visit (HOSPITAL_BASED_OUTPATIENT_CLINIC_OR_DEPARTMENT_OTHER): Payer: Medicare Other | Admitting: Pharmacist

## 2020-04-13 ENCOUNTER — Other Ambulatory Visit: Payer: Self-pay

## 2020-04-13 VITALS — BP 130/68 | HR 82 | Resp 16 | Wt 119.2 lb

## 2020-04-13 DIAGNOSIS — F17200 Nicotine dependence, unspecified, uncomplicated: Secondary | ICD-10-CM | POA: Diagnosis not present

## 2020-04-13 DIAGNOSIS — M1712 Unilateral primary osteoarthritis, left knee: Secondary | ICD-10-CM | POA: Insufficient documentation

## 2020-04-13 DIAGNOSIS — E222 Syndrome of inappropriate secretion of antidiuretic hormone: Secondary | ICD-10-CM | POA: Diagnosis not present

## 2020-04-13 DIAGNOSIS — F1020 Alcohol dependence, uncomplicated: Secondary | ICD-10-CM | POA: Insufficient documentation

## 2020-04-13 DIAGNOSIS — K219 Gastro-esophageal reflux disease without esophagitis: Secondary | ICD-10-CM | POA: Diagnosis not present

## 2020-04-13 DIAGNOSIS — Z880 Allergy status to penicillin: Secondary | ICD-10-CM | POA: Insufficient documentation

## 2020-04-13 DIAGNOSIS — E876 Hypokalemia: Secondary | ICD-10-CM | POA: Diagnosis not present

## 2020-04-13 DIAGNOSIS — I10 Essential (primary) hypertension: Secondary | ICD-10-CM | POA: Insufficient documentation

## 2020-04-13 DIAGNOSIS — J449 Chronic obstructive pulmonary disease, unspecified: Secondary | ICD-10-CM | POA: Diagnosis not present

## 2020-04-13 DIAGNOSIS — Z79899 Other long term (current) drug therapy: Secondary | ICD-10-CM | POA: Insufficient documentation

## 2020-04-13 DIAGNOSIS — Z23 Encounter for immunization: Secondary | ICD-10-CM | POA: Diagnosis not present

## 2020-04-13 DIAGNOSIS — F17210 Nicotine dependence, cigarettes, uncomplicated: Secondary | ICD-10-CM | POA: Diagnosis not present

## 2020-04-13 DIAGNOSIS — Z760 Encounter for issue of repeat prescription: Secondary | ICD-10-CM | POA: Diagnosis not present

## 2020-04-13 DIAGNOSIS — R197 Diarrhea, unspecified: Secondary | ICD-10-CM | POA: Insufficient documentation

## 2020-04-13 MED ORDER — PANTOPRAZOLE SODIUM 40 MG PO TBEC: 40.0000 mg | DELAYED_RELEASE_TABLET | Freq: Every day | ORAL | 5 refills | Status: DC

## 2020-04-13 MED ORDER — ALBUTEROL SULFATE (2.5 MG/3ML) 0.083% IN NEBU: 2.5000 mg | INHALATION_SOLUTION | Freq: Four times a day (QID) | RESPIRATORY_TRACT | 6 refills | Status: DC | PRN

## 2020-04-13 NOTE — Progress Notes (Signed)
Patient presents for vaccination against influenza per orders of Dr. Johnson. Consent given. Counseling provided. No contraindications exists. Vaccine administered without incident.  ° °Luke Van Ausdall, PharmD, CPP °Clinical Pharmacist °[facility] °[phone] ° °

## 2020-04-13 NOTE — Patient Instructions (Addendum)
Try setting a quit date to quit smoking.  Discontinue lisinopril.   Influenza Virus Vaccine injection (Fluarix) What is this medicine? INFLUENZA VIRUS VACCINE (in floo EN zuh VAHY ruhs vak SEEN) helps to reduce the risk of getting influenza also known as the flu. This medicine may be used for other purposes; ask your health care provider or pharmacist if you have questions. COMMON BRAND NAME(S): Fluarix, Fluzone What should I tell my health care provider before I take this medicine? They need to know if you have any of these conditions:  bleeding disorder like hemophilia  fever or infection  Guillain-Barre syndrome or other neurological problems  immune system problems  infection with the human immunodeficiency virus (HIV) or AIDS  low blood platelet counts  multiple sclerosis  an unusual or allergic reaction to influenza virus vaccine, eggs, chicken proteins, latex, gentamicin, other medicines, foods, dyes or preservatives  pregnant or trying to get pregnant  breast-feeding How should I use this medicine? This vaccine is for injection into a muscle. It is given by a health care professional. A copy of Vaccine Information Statements will be given before each vaccination. Read this sheet carefully each time. The sheet may change frequently. Talk to your pediatrician regarding the use of this medicine in children. Special care may be needed. Overdosage: If you think you have taken too much of this medicine contact a poison control center or emergency room at once. NOTE: This medicine is only for you. Do not share this medicine with others. What if I miss a dose? This does not apply. What may interact with this medicine?  chemotherapy or radiation therapy  medicines that lower your immune system like etanercept, anakinra, infliximab, and adalimumab  medicines that treat or prevent blood clots like warfarin  phenytoin  steroid medicines like prednisone or  cortisone  theophylline  vaccines This list may not describe all possible interactions. Give your health care provider a list of all the medicines, herbs, non-prescription drugs, or dietary supplements you use. Also tell them if you smoke, drink alcohol, or use illegal drugs. Some items may interact with your medicine. What should I watch for while using this medicine? Report any side effects that do not go away within 3 days to your doctor or health care professional. Call your health care provider if any unusual symptoms occur within 6 weeks of receiving this vaccine. You may still catch the flu, but the illness is not usually as bad. You cannot get the flu from the vaccine. The vaccine will not protect against colds or other illnesses that may cause fever. The vaccine is needed every year. What side effects may I notice from receiving this medicine? Side effects that you should report to your doctor or health care professional as soon as possible:  allergic reactions like skin rash, itching or hives, swelling of the face, lips, or tongue Side effects that usually do not require medical attention (report to your doctor or health care professional if they continue or are bothersome):  fever  headache  muscle aches and pains  pain, tenderness, redness, or swelling at site where injected  weak or tired This list may not describe all possible side effects. Call your doctor for medical advice about side effects. You may report side effects to FDA at 1-800-FDA-1088. Where should I keep my medicine? This vaccine is only given in a clinic, pharmacy, doctor's office, or other health care setting and will not be stored at home. NOTE: This sheet is  a summary. It may not cover all possible information. If you have questions about this medicine, talk to your doctor, pharmacist, or health care provider.  2020 Elsevier/Gold Standard (2008-01-19 09:30:40)

## 2020-04-13 NOTE — Progress Notes (Signed)
Patient ID: Anna Mcguire, female    DOB: [birth year]  MRN: [id number]  CC: Hypertension, Epistaxis, Knee Pain (left), and Shoulder Pain (right )   Subjective: Anna Mcguire is a 64 y.o. female who presents for chronic ds management.  I saw her 2 months ago in Dr. Debroah Baller absence.  She was to follow-up in 2 months with her PCP but she was not on the schedule at that time. Her concerns today include:  Patient with history of HTN, HL, tob dep, COPD, OA left knee, EtOH abuse, chronic diarrhea, SIADH   HTN:  HCTZ d/c on last visit.  Replaced with Norvasc.  Reports compliance with Norvasc.  Lisinopril recently discontinued by her pulmonologist and changed to an ARB instead to make sure that it was not contributing to her cough.  -no device to check BP Took meds already today.  Of note however she still has the bottle of lisinopril with her today along with Norvasc and Irbesartan. tries to limit salt No LE edema.  No chest pains or headaches  COPD:  Saw [facility].  Started on Spiriva which he finds very helpful.  Uses Ventilin 2-3 times a day. Still smoking but has dec to 5 cig/day.  Tried to get nicotine patches but insurance does not cover and can not afford out of pocket.  She is wanting to quit.  ETOH:'s visit she told me she was drinking 2-3 40 ounce beers a day.  Down to one 40 oz beer a day now.  She is trying to quit completely.  She never did hear from our Child psychotherapist.Marland Kitchen  GERD: Requests refill on pantoprazole. HM: Due for flu shot. Patient Active Problem List   Diagnosis Date Noted  . Chronic cough 03/21/2020  . Alcohol use disorder, moderate, dependence (HCC) 01/30/2020  . 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  . Tobacco dependence 01/30/2020  . Diarrhea of infectious origin 01/30/2020  . Hyponatremia 01/13/2020  . Alcohol abuse 01/13/2020  . Tobacco abuse 01/13/2020  . Chronic diarrhea 01/13/2020  . Hypophosphatemia 01/13/2020  . Hypertension    . Chronic obstructive pulmonary disease (HCC)   . Alcohol use   . Hypokalemia 01/12/2020  . Tobacco use 10/06/2019  . Primary osteoarthritis of left knee 09/08/2019  . Pain in right shoulder 08/11/2019     Current Outpatient Medications on File Prior to Visit  Medication Sig Dispense Refill  . albuterol (VENTOLIN HFA) 108 (90 Base) MCG/ACT inhaler Inhale 2 puffs into the lungs every 6 (six) hours as needed for wheezing or shortness of breath. 18 g 0  . amLODipine (NORVASC) 10 MG tablet Take 1 tablet (10 mg total) by mouth daily. To lower blood pressure 90 tablet 1  . atorvastatin (LIPITOR) 80 MG tablet Take 1 tablet (80 mg total) by mouth daily. 90 tablet 2  . folic acid (FOLVITE) 1 MG tablet Take 1 tablet (1 mg total) by mouth daily. 30 tablet 3  . irbesartan (AVAPRO) 75 MG tablet Take 1 tablet (75 mg total) by mouth daily. 30 tablet 5  . loratadine (CLARITIN) 10 MG tablet Take 1 tablet (10 mg total) by mouth daily. 90 tablet 2  . meloxicam (MOBIC) 15 MG tablet Take 1 tablet (15 mg total) by mouth daily. (Patient not taking: Reported on 03/21/2020) 30 tablet 0  . nicotine (NICODERM CQ - DOSED IN MG/24 HOURS) 21 mg/24hr patch Place 1 patch (21 mg total) onto the skin daily. (Patient not taking: Reported on 03/21/2020) 28  patch 1  . nicotine (NICODERM CQ - DOSED IN MG/24 HR) 7 mg/24hr patch Place 1 patch (7 mg total) onto the skin daily. 28 patch 1  . thiamine 100 MG tablet Take 1 tablet (100 mg total) by mouth daily. 30 tablet 0  . tiotropium (SPIRIVA HANDIHALER) 18 MCG inhalation capsule Place 1 capsule (18 mcg total) into inhaler and inhale daily. 30 capsule 12  . tiotropium (SPIRIVA) 18 MCG inhalation capsule Place 1 capsule (18 mcg total) into inhaler and inhale daily. 30 capsule 5  . [DISCONTINUED] fluticasone (FLONASE) 50 MCG/ACT nasal spray Place 1 spray into both nostrils daily.    . [DISCONTINUED] lisinopril-hydrochlorothiazide (PRINZIDE,ZESTORETIC) 20-12.5 MG per tablet Take 1  tablet by mouth daily.    . [DISCONTINUED] lovastatin (MEVACOR) 20 MG tablet Take 20 mg by mouth daily at 6 PM.     No current facility-administered medications on file prior to visit.    Allergies  Allergen Reactions  . Penicillins Diarrhea    Social History   Socioeconomic History  . Marital status: Single    Spouse name: Not on file  . Number of children: Not on file  . Years of education: Not on file  . Highest education level: Not on file  Occupational History  . Not on file  Tobacco Use  . Smoking status: Current Every Day Smoker    Packs/day: 0.50    Years: 48.00    Pack years: 24.00    Types: Cigarettes  . Smokeless tobacco: Never Used  Substance and Sexual Activity  . Alcohol use: Yes    Comment: occ  . Drug use: Yes    Types: Marijuana  . Sexual activity: Not Currently  Other Topics Concern  . Not on file  Social History Narrative  . Not on file   Social Determinants of Health   Financial Resource Strain:   . Difficulty of Paying Living Expenses: Not on file  Food Insecurity:   . Worried About Programme researcher, broadcasting/film/video in the Last Year: Not on file  . Ran Out of Food in the Last Year: Not on file  Transportation Needs:   . Lack of Transportation (Medical): Not on file  . Lack of Transportation (Non-Medical): Not on file  Physical Activity:   . Days of Exercise per Week: Not on file  . Minutes of Exercise per Session: Not on file  Stress:   . Feeling of Stress : Not on file  Social Connections:   . Frequency of Communication with Friends and Family: Not on file  . Frequency of Social Gatherings with Friends and Family: Not on file  . Attends Religious Services: Not on file  . Active Member of Clubs or Organizations: Not on file  . Attends Banker Meetings: Not on file  . Marital Status: Not on file  Intimate Partner Violence:   . Fear of Current or Ex-Partner: Not on file  . Emotionally Abused: Not on file  . Physically Abused: Not on  file  . Sexually Abused: Not on file    Family History  Problem Relation Age of Onset  . Kidney failure Mother   . Aneurysm Father     Past Surgical History:  Procedure Laterality Date  . COLONOSCOPY WITH PROPOFOL N/A 06/07/2014   Procedure: COLONOSCOPY WITH PROPOFOL;  Surgeon: Willis Modena, MD;  Location: [facility];  Service: Endoscopy;  Laterality: N/A;  . ESOPHAGOGASTRODUODENOSCOPY (EGD) WITH PROPOFOL N/A 06/07/2014   Procedure: ESOPHAGOGASTRODUODENOSCOPY (EGD) WITH PROPOFOL;  Surgeon:  Willis Modena, MD;  Location: [facility];  Service: Endoscopy;  Laterality: N/A;    ROS: Review of Systems Negative except as stated above  PHYSICAL EXAM: BP 130/68   Pulse 82   Resp 16   Wt 119 lb 3.2 oz (54.1 kg)   SpO2 100%   BMI 18.67 kg/m   Physical Exam  General appearance -older African-American female in NAD. Neck - supple, no significant adenopathy Chest -she has good air entry.  No crackles or wheezes heard at this time. Heart - normal rate, regular rhythm, normal S1, S2, no murmurs, rubs, clicks or gallops Extremities -no lower extremity edema.  CMP Latest Ref Rng & Units 02/09/2020 01/30/2020 01/17/2020  Glucose 65 - 99 mg/dL - 89 91  BUN 8 - 27 mg/dL - 10 <5(J)  Creatinine 0.57 - 1.00 mg/dL - 8.84 1.66  Sodium 063 - 144 mmol/L - 139 132(L)  Potassium 3.5 - 5.2 mmol/L 5.0 3.0(L) 3.2(L)  Chloride 96 - 106 mmol/L - 98 102  CO2 20 - 29 mmol/L - 26 22  Calcium 8.7 - 10.3 mg/dL - 9.8 01.6  Total Protein 6.5 - 8.1 g/dL - - 4.7(L)  Total Bilirubin 0.3 - 1.2 mg/dL - - 0.7  Alkaline Phos 38 - 126 U/L - - 61  AST 15 - 41 U/L - - 27  ALT 0 - 44 U/L - - 20   Lipid Panel     Component Value Date/Time   CHOL 159 01/15/2020 0258   TRIG 117 01/15/2020 0258   HDL 103 01/15/2020 0258   CHOLHDL 1.5 01/15/2020 0258   VLDL 23 01/15/2020 0258   LDLCALC 33 01/15/2020 0258    CBC    Component Value Date/Time   WBC 4.8 01/17/2020 0456   RBC 2.51 (L) 01/17/2020 0456   HGB 7.9  (L) 01/17/2020 0456   HGB 11.6 07/29/2019 1610   HCT 23.9 (L) 01/17/2020 0456   HCT 33.8 (L) 07/29/2019 1610   PLT 229 01/17/2020 0456   PLT 305 07/29/2019 1610   MCV 95.2 01/17/2020 0456   MCV 94 07/29/2019 1610   MCH 31.5 01/17/2020 0456   MCHC 33.1 01/17/2020 0456   RDW 12.7 01/17/2020 0456   RDW 12.2 07/29/2019 1610   LYMPHSABS 0.9 01/17/2020 0456   MONOABS 0.5 01/17/2020 0456   EOSABS 0.1 01/17/2020 0456   BASOSABS 0.0 01/17/2020 0456    ASSESSMENT AND PLAN:  1. Essential hypertension At goal.  Patient advised to stop the lisinopril.  I pointed out the bottle to her so that she can take it out.  Continue amlodipine and irbesartan  2. Chronic obstructive pulmonary disease, unspecified COPD type (HCC) -Continue Spiriva and albuterol inhaler.  Strongly advised to stop smoking - albuterol (PROVENTIL) (2.5 MG/3ML) 0.083% nebulizer solution; Take 3 mLs (2.5 mg total) by nebulization every 6 (six) hours as needed for wheezing or shortness of breath.  Dispense: 150 mL; Refill: 6  3. Tobacco dependence Commended her on cutting back.  She feels that she will be able to quit on her own since she is not able to afford the nicotine patches.  I told her she may want to try the nicotine gum as those would be cheaper than the patches.  Encouraged her to set a quit date.  4. Alcohol use disorder, moderate, dependence (HCC) Commended her on cutting back.  Challenged her to cut back even more as she is attempting to quit.  She reports that they are several AA meetings held  close to her house.  She will look into getting into one of them.  5. Gastroesophageal reflux disease, unspecified whether esophagitis present - pantoprazole (PROTONIX) 40 MG tablet; Take 1 tablet (40 mg total) by mouth daily.  Dispense: 30 tablet; Refill: 5  6. Need for influenza vaccination Given today.  She will follow-up in 3 to 4 months with Dr. Jillyn HiddenFulp who is now back in office.  Patient was given the opportunity to  ask questions.  Patient verbalized understanding of the plan and was able to repeat key elements of the plan.   No orders of the defined types were placed in this encounter.    Requested Prescriptions   Signed Prescriptions Disp Refills  . pantoprazole (PROTONIX) 40 MG tablet 30 tablet 5    Sig: Take 1 tablet (40 mg total) by mouth daily.  Marland Kitchen. albuterol (PROVENTIL) (2.5 MG/3ML) 0.083% nebulizer solution 150 mL 6    Sig: Take 3 mLs (2.5 mg total) by nebulization every 6 (six) hours as needed for wheezing or shortness of breath.    Return in about 3 months (around 07/14/2020) for Dr. Jillyn HiddenFulp.  Jonah Blueeborah Jamerson Vonbargen, MD, FACP

## 2020-04-14 ENCOUNTER — Emergency Department (HOSPITAL_COMMUNITY): Payer: Medicare Other

## 2020-04-14 ENCOUNTER — Encounter (HOSPITAL_COMMUNITY): Payer: Self-pay

## 2020-04-14 ENCOUNTER — Emergency Department (HOSPITAL_COMMUNITY)
Admission: EM | Admit: 2020-04-14 | Discharge: 2020-04-14 | Disposition: A | Discharge status: Home / Self Care | Payer: Medicare Other | Attending: Emergency Medicine | Admitting: Emergency Medicine

## 2020-04-14 DIAGNOSIS — I10 Essential (primary) hypertension: Secondary | ICD-10-CM | POA: Insufficient documentation

## 2020-04-14 DIAGNOSIS — M79671 Pain in right foot: Secondary | ICD-10-CM | POA: Insufficient documentation

## 2020-04-14 DIAGNOSIS — Y9341 Activity, dancing: Secondary | ICD-10-CM | POA: Diagnosis not present

## 2020-04-14 DIAGNOSIS — M25561 Pain in right knee: Secondary | ICD-10-CM | POA: Diagnosis not present

## 2020-04-14 DIAGNOSIS — J449 Chronic obstructive pulmonary disease, unspecified: Secondary | ICD-10-CM | POA: Diagnosis not present

## 2020-04-14 DIAGNOSIS — F17210 Nicotine dependence, cigarettes, uncomplicated: Secondary | ICD-10-CM | POA: Diagnosis not present

## 2020-04-14 DIAGNOSIS — M25551 Pain in right hip: Secondary | ICD-10-CM | POA: Insufficient documentation

## 2020-04-14 DIAGNOSIS — R519 Headache, unspecified: Secondary | ICD-10-CM | POA: Diagnosis not present

## 2020-04-14 DIAGNOSIS — W19XXXA Unspecified fall, initial encounter: Secondary | ICD-10-CM

## 2020-04-14 DIAGNOSIS — W010XXA Fall on same level from slipping, tripping and stumbling without subsequent striking against object, initial encounter: Secondary | ICD-10-CM | POA: Insufficient documentation

## 2020-04-14 DIAGNOSIS — M542 Cervicalgia: Secondary | ICD-10-CM | POA: Insufficient documentation

## 2020-04-14 DIAGNOSIS — Z79899 Other long term (current) drug therapy: Secondary | ICD-10-CM | POA: Insufficient documentation

## 2020-04-14 DIAGNOSIS — M25511 Pain in right shoulder: Secondary | ICD-10-CM | POA: Diagnosis not present

## 2020-04-14 MED ORDER — ALBUTEROL SULFATE HFA 108 (90 BASE) MCG/ACT IN AERS
4.0000 | INHALATION_SPRAY | Freq: Once | RESPIRATORY_TRACT | Status: AC
  Administered 2020-04-14: 4 via RESPIRATORY_TRACT
  Filled 2020-04-14: qty 6.7

## 2020-04-14 MED ORDER — AEROCHAMBER PLUS FLO-VU LARGE MISC
Status: AC
  Filled 2020-04-14: qty 1

## 2020-04-14 MED ORDER — AEROCHAMBER PLUS FLO-VU LARGE MISC
1.0000 | Freq: Once | Status: AC
  Administered 2020-04-14: 1

## 2020-04-14 MED ORDER — HYDROCODONE-ACETAMINOPHEN 5-325 MG PO TABS
1.0000 | ORAL_TABLET | Freq: Once | ORAL | Status: AC
  Administered 2020-04-14: 1 via ORAL
  Filled 2020-04-14: qty 1

## 2020-04-14 NOTE — ED Notes (Signed)
Patient transported to x-ray. ?

## 2020-04-14 NOTE — Discharge Instructions (Signed)
You are seen in the emergency department today after a fall.  Your CT scans and x-rays did not show signs of a fracture or dislocation at this time.  There is some degenerative changes noted in your neck.  Please take Tylenol per the counter dosing With discomfort.  Please follow-up with your primary care provider within 1 week.  Return to the ER for new or worsening symptoms or any other concerns.

## 2020-04-14 NOTE — ED Provider Notes (Signed)
[facility]   CSN: [id number] Arrival date & time: 04/14/20  0349     History Chief Complaint  Patient presents with  . Fall    Anna Mcguire is a 64 y.o. female with a history of hypertension, COPD, SIADH, & hypophosphatemia who presents to the ED S/p mechanical fall last night with complaints of R sided pain. Patient states she was dancing, tripped, and fell onto her R side/face. No LOC. Having pain to her head, neck, right shoulder, hip, knee, and foot. Pain is worse with movement. No alleviating factors. Denies visual disturbance, numbness, weakness, vomiting, seizure activity, chest pain, dyspnea, or abdominal pain. No anticoagulation.  HPI     Past Medical History:  Diagnosis Date  . Acid reflux   . Arthritis   . Hypertension     Patient Active Problem List   Diagnosis Date Noted  . Chronic cough 03/21/2020  . Alcohol use disorder, moderate, dependence (HCC) 01/30/2020  . 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  . Tobacco dependence 01/30/2020  . Diarrhea of infectious origin 01/30/2020  . Hyponatremia 01/13/2020  . Alcohol abuse 01/13/2020  . Tobacco abuse 01/13/2020  . Chronic diarrhea 01/13/2020  . Hypophosphatemia 01/13/2020  . Hypertension   . Chronic obstructive pulmonary disease (HCC)   . Alcohol use   . Hypokalemia 01/12/2020  . Tobacco use 10/06/2019  . Primary osteoarthritis of left knee 09/08/2019  . Pain in right shoulder 08/11/2019    Past Surgical History:  Procedure Laterality Date  . COLONOSCOPY WITH PROPOFOL N/A 06/07/2014   Procedure: COLONOSCOPY WITH PROPOFOL;  Surgeon: Willis ModenaWilliam Outlaw, MD;  Location: [facility];  Service: Endoscopy;  Laterality: N/A;  . ESOPHAGOGASTRODUODENOSCOPY (EGD) WITH PROPOFOL N/A 06/07/2014   Procedure: ESOPHAGOGASTRODUODENOSCOPY (EGD) WITH PROPOFOL;  Surgeon: Willis ModenaWilliam Outlaw, MD;  Location: [facility];  Service: Endoscopy;   Laterality: N/A;     OB History   No obstetric history on file.     Family History  Problem Relation Age of Onset  . Kidney failure Mother   . Aneurysm Father     Social History   Tobacco Use  . Smoking status: Current Every Day Smoker    Packs/day: 0.50    Years: 48.00    Pack years: 24.00    Types: Cigarettes  . Smokeless tobacco: Never Used  Substance Use Topics  . Alcohol use: Yes    Comment: occ  . Drug use: Yes    Types: Marijuana    Home Medications Prior to Admission medications   Medication Sig Start Date End Date Taking? Authorizing Provider  albuterol (PROVENTIL) (2.5 MG/3ML) 0.083% nebulizer solution Take 3 mLs (2.5 mg total) by nebulization every 6 (six) hours as needed for wheezing or shortness of breath. 04/13/20   Marcine MatarJohnson, Deborah B, MD  albuterol (VENTOLIN HFA) 108 (90 Base) MCG/ACT inhaler Inhale 2 puffs into the lungs every 6 (six) hours as needed for wheezing or shortness of breath. 01/17/20   Drema DallasWoods, Curtis J, MD  amLODipine (NORVASC) 10 MG tablet Take 1 tablet (10 mg total) by mouth daily. To lower blood pressure 01/30/20   Marcine MatarJohnson, Deborah B, MD  atorvastatin (LIPITOR) 80 MG tablet Take 1 tablet (80 mg total) by mouth daily. 03/19/20   Fulp, Cammie, MD  folic acid (FOLVITE) 1 MG tablet Take 1 tablet (1 mg total) by mouth daily. 01/30/20   Marcine MatarJohnson, Deborah B, MD  irbesartan (AVAPRO) 75 MG tablet Take 1 tablet (75 mg total)  by mouth daily. 03/21/20   Leslye Peer, MD  loratadine (CLARITIN) 10 MG tablet Take 1 tablet (10 mg total) by mouth daily. 03/19/20   Fulp, Cammie, MD  meloxicam (MOBIC) 15 MG tablet Take 1 tablet (15 mg total) by mouth daily. Patient not taking: Reported on 03/21/2020 02/22/20   Linus Mako B, NP  nicotine (NICODERM CQ - DOSED IN MG/24 HOURS) 21 mg/24hr patch Place 1 patch (21 mg total) onto the skin daily. Patient not taking: Reported on 03/21/2020 01/30/20   Marcine Matar, MD  nicotine (NICODERM CQ - DOSED IN MG/24 HR) 7 mg/24hr  patch Place 1 patch (7 mg total) onto the skin daily. 03/26/20   Leslye Peer, MD  pantoprazole (PROTONIX) 40 MG tablet Take 1 tablet (40 mg total) by mouth daily. 04/13/20   Marcine Matar, MD  thiamine 100 MG tablet Take 1 tablet (100 mg total) by mouth daily. 01/18/20   Drema Dallas, MD  tiotropium (SPIRIVA HANDIHALER) 18 MCG inhalation capsule Place 1 capsule (18 mcg total) into inhaler and inhale daily. 01/30/20   Marcine Matar, MD  tiotropium (SPIRIVA) 18 MCG inhalation capsule Place 1 capsule (18 mcg total) into inhaler and inhale daily. 03/21/20   Leslye Peer, MD  fluticasone (FLONASE) 50 MCG/ACT nasal spray Place 1 spray into both nostrils daily.  05/12/19  [provider]  lisinopril-hydrochlorothiazide (PRINZIDE,ZESTORETIC) 20-12.5 MG per tablet Take 1 tablet by mouth daily.  05/12/19  [provider]  lovastatin (MEVACOR) 20 MG tablet Take 20 mg by mouth daily at 6 PM.  05/12/19  [provider]    Allergies    Penicillins  Review of Systems   Review of Systems  Constitutional: Negative for chills and fever.  Eyes: Negative for visual disturbance.  Respiratory: Negative for shortness of breath.   Cardiovascular: Negative for chest pain.  Gastrointestinal: Negative for abdominal pain and vomiting.  Musculoskeletal: Positive for arthralgias and neck pain.  Skin: Negative for wound.  Neurological: Positive for headaches. Negative for dizziness, seizures, syncope, speech difficulty, weakness and light-headedness.  All other systems reviewed and are negative.   Physical Exam Updated Vital Signs BP 129/90 (BP Location: Left Arm)   Pulse 83   Temp 97.6 F (36.4 C) (Oral)   Resp 17   SpO2 100%   Physical Exam Vitals and nursing note reviewed.  Constitutional:      General: She is not in acute distress.    Appearance: She is not ill-appearing or toxic-appearing.  HENT:     Head: Normocephalic and atraumatic.     Comments: No raccoon  eyes or battle sign.    Ears:     Comments: No hemotympanum.    Nose:     Comments: No obvious deformity.  Mild tenderness to the anterior nose.  No septal hematoma.    Mouth/Throat:     Comments: Uvula midline. Eyes:     Extraocular Movements: Extraocular movements intact.     Pupils: Pupils are equal, round, and reactive to light.  Neck:     Comments: C-collar present.  Maintained throughout exam. Cardiovascular:     Rate and Rhythm: Normal rate and regular rhythm.     Comments: 2+ symmetric radial and DP pulses bilaterally. Pulmonary:     Effort: Pulmonary effort is normal. No respiratory distress.     Breath sounds: Normal breath sounds. No stridor. No wheezing or rales.  Chest:     Chest wall: No tenderness.  Abdominal:  General: There is no distension.     Palpations: Abdomen is soft.     Tenderness: There is no abdominal tenderness. There is no guarding or rebound.  Musculoskeletal:     Comments: Upper extremities: Intact active range of motion throughout.  Tender over the right trapezius muscle and diffuse glenohumeral joint.  Otherwise nontender. Back: No midline tenderness Lower extremities: Intact active range of motion throughout.  Tender to palpation to the right anterior hip as well as the right anterior knee, and diffuse midfoot.  Otherwise nontender.  Neurological:     Mental Status: She is alert.     Comments: Alert.  Clear speech.  CN III to XII grossly intact.  Sensation and strength grossly intact bilateral upper and lower extremities.  Patient is ambulatory.     ED Results / Procedures / Treatments   Labs (all labs ordered are listed, but only abnormal results are displayed) Labs Reviewed - No data to display  EKG None  Radiology DG Shoulder Right  Result Date: 04/14/2020 CLINICAL DATA:  Right shoulder pain after a fall. EXAM: RIGHT SHOULDER - 2+ VIEW COMPARISON:  08/11/2019 FINDINGS: Mild degenerative irregularity of the undersurface of the  acromioclavicular joint. Mild insertional irregularity at the rotator cuff. No acute fracture or dislocation. Visualized portion of the right hemithorax is normal. IMPRESSION: No acute osseous abnormality. Electronically Signed   By: Jeronimo Greaves M.D.   On: 04/14/2020 06:34   CT Head Wo Contrast  Result Date: 04/14/2020 CLINICAL DATA:  Status post fall.  Patient complains of neck pain. EXAM: CT HEAD WITHOUT CONTRAST CT CERVICAL SPINE WITHOUT CONTRAST TECHNIQUE: Multidetector CT imaging of the head and cervical spine was performed following the standard protocol without intravenous contrast. Multiplanar CT image reconstructions of the cervical spine were also generated. COMPARISON:  None. FINDINGS: CT HEAD FINDINGS Brain: No evidence of acute infarction, hemorrhage, hydrocephalus, extra-axial collection or mass lesion/mass effect. There is mild chronic diffuse atrophy. Vascular: No hyperdense vessel is noted. Skull: Normal. Negative for fracture or focal lesion. Sinuses/Orbits: No acute finding. Other: None. CT CERVICAL SPINE FINDINGS Alignment: There is kyphosis of cervical spine. Skull base and vertebrae: No acute fracture. No primary bone lesion or focal pathologic process. Soft tissues and spinal canal: No prevertebral fluid or swelling. No visible canal hematoma. Disc levels: Extensive degenerative joint changes with marked narrowed joint space and template sclerosis is identified at C5-6. Upper chest: Negative. Other: None. IMPRESSION: 1. No focal acute intracranial abnormality identified. 2. No acute fracture or dislocation of cervical spine. 3. Extensive degenerative joint changes of cervical spine at C5-6. Electronically Signed   By: Sherian Rein M.D.   On: 04/14/2020 10:07   CT Cervical Spine Wo Contrast  Result Date: 04/14/2020 CLINICAL DATA:  Status post fall.  Patient complains of neck pain. EXAM: CT HEAD WITHOUT CONTRAST CT CERVICAL SPINE WITHOUT CONTRAST TECHNIQUE: Multidetector CT imaging of  the head and cervical spine was performed following the standard protocol without intravenous contrast. Multiplanar CT image reconstructions of the cervical spine were also generated. COMPARISON:  None. FINDINGS: CT HEAD FINDINGS Brain: No evidence of acute infarction, hemorrhage, hydrocephalus, extra-axial collection or mass lesion/mass effect. There is mild chronic diffuse atrophy. Vascular: No hyperdense vessel is noted. Skull: Normal. Negative for fracture or focal lesion. Sinuses/Orbits: No acute finding. Other: None. CT CERVICAL SPINE FINDINGS Alignment: There is kyphosis of cervical spine. Skull base and vertebrae: No acute fracture. No primary bone lesion or focal pathologic process. Soft tissues and spinal  canal: No prevertebral fluid or swelling. No visible canal hematoma. Disc levels: Extensive degenerative joint changes with marked narrowed joint space and template sclerosis is identified at C5-6. Upper chest: Negative. Other: None. IMPRESSION: 1. No focal acute intracranial abnormality identified. 2. No acute fracture or dislocation of cervical spine. 3. Extensive degenerative joint changes of cervical spine at C5-6. Electronically Signed   By: Sherian Rein M.D.   On: 04/14/2020 10:07   DG Knee Complete 4 Views Right  Result Date: 04/14/2020 CLINICAL DATA:  Right knee pain after a fall EXAM: RIGHT KNEE - COMPLETE 4+ VIEW COMPARISON:  None. FINDINGS: Mild joint space narrowing involving all 3 compartments of the knee. No acute fracture or dislocation. Vascular calcifications. No joint effusion. IMPRESSION: Degenerative change, without acute osseous finding. Electronically Signed   By: Jeronimo Greaves M.D.   On: 04/14/2020 06:36   DG Foot Complete Right  Result Date: 04/14/2020 CLINICAL DATA:  Status post fall with right foot pain EXAM: RIGHT FOOT COMPLETE - 3+ VIEW COMPARISON:  None. FINDINGS: There is no evidence of fracture or dislocation. Soft tissues are unremarkable. IMPRESSION: No acute  fracture or dislocation. Electronically Signed   By: Sherian Rein M.D.   On: 04/14/2020 09:54   DG Hip Unilat With Pelvis 2-3 Views Right  Result Date: 04/14/2020 CLINICAL DATA:  Status post fall with right hip pain. EXAM: DG HIP (WITH OR WITHOUT PELVIS) 2-3V RIGHT COMPARISON:  None. FINDINGS: There is no evidence of hip fracture or dislocation. Narrow bilateral hip joint spaces are noted. Degenerative joint changes of the lower lumbar spine are noted. IMPRESSION: No acute fracture or dislocation. Electronically Signed   By: Sherian Rein M.D.   On: 04/14/2020 09:52    Procedures Procedures (including critical care time)  Medications Ordered in ED Medications  AeroChamber Plus Flo-Vu Large MISC (has no administration in time range)  HYDROcodone-acetaminophen (NORCO/VICODIN) 5-325 MG per tablet 1 tablet (1 tablet Oral Given 04/14/20 0937)  albuterol (VENTOLIN HFA) 108 (90 Base) MCG/ACT inhaler 4 puff (4 puffs Inhalation Given 04/14/20 1040)  AeroChamber Plus Flo-Vu Large MISC 1 each (1 each Other Given 04/14/20 1040)    ED Course  I have reviewed the triage vital signs and the nursing notes.  Pertinent labs & imaging results that were available during my care of the patient were reviewed by me and considered in my medical decision making (see chart for details).    MDM Rules/Calculators/A&P                         Patient presents to the emergency department status post mechanical fall with complaints of pain in multiple locations.  She is nontoxic, resting comfortably, her vitals are within normal limits.  X-rays of the right shoulder and right knee were ordered by triage team.  I have additionally ordered a head CT, cervical spine CT, and x-rays of the right hip and foot.  I personally reviewed and interpreted all imaging.  And agree with radiologist interpretation.  Overall patient without significant traumatic injury.  She has no chest or abdominal tenderness to raise concern for acute  intrathoracic/abdominal trauma.  She has no midline spinal tenderness or focal neurologic deficits to raise concern for significant head/spinal injury.  She is neurovascularly intact distally to all areas of musculoskeletal discomfort.  Patient is ambulatory.  She did have an episode of a coughing spell during which we ordered her a spacer and albuterol inhaler, she had her  own inhaler to use with a spacer, she took a couple puffs of this with complete resolution of her coughing spell.  Repeat lung exam without adventitious breath sounds.  She states it feels similar to coughing spells she has with her COPD at home that are relieved with inhaler.  Clinically does not seem like COPD exacerbation requiring steroids or admission.  She feels ready to go home and appears appropriate for discharge at this time. I discussed results, treatment plan, need for follow-up, and return precautions with the patient. Provided opportunity for questions, patient confirmed understanding and is in agreement with plan.  Findings and plan of care discussed with supervising physician Dr. Rodena Medin who has evaluated the patient and is in agreement.  Final Clinical Impression(s) / ED Diagnoses Final diagnoses:  Fall, initial encounter    Rx / DC Orders ED Discharge Orders    None       Cherly Anderson, PA-C 04/14/20 1044    Wynetta Fines, MD 04/15/20 [phone]

## 2020-04-14 NOTE — ED Triage Notes (Signed)
Pt comes via [facility], pt was dancing and tripped and fell onto her R side, c/o of neck pain, R shoulder pain, R knee and nose

## 2020-04-22 ENCOUNTER — Other Ambulatory Visit: Payer: Self-pay | Admitting: Family Medicine

## 2020-04-22 DIAGNOSIS — I10 Essential (primary) hypertension: Secondary | ICD-10-CM

## 2020-04-25 ENCOUNTER — Other Ambulatory Visit: Payer: Self-pay

## 2020-04-25 ENCOUNTER — Encounter: Payer: Self-pay | Admitting: Emergency Medicine

## 2020-04-25 ENCOUNTER — Ambulatory Visit (INDEPENDENT_AMBULATORY_CARE_PROVIDER_SITE_OTHER): Payer: Medicare Other | Admitting: Emergency Medicine

## 2020-04-25 DIAGNOSIS — R053 Chronic cough: Secondary | ICD-10-CM

## 2020-04-25 DIAGNOSIS — J449 Chronic obstructive pulmonary disease, unspecified: Secondary | ICD-10-CM | POA: Diagnosis not present

## 2020-04-25 DIAGNOSIS — Z720 Tobacco use: Secondary | ICD-10-CM

## 2020-04-25 LAB — PULMONARY FUNCTION TEST
DL/VA % pred: 76 %
DL/VA: 3.13 ml/min/mmHg/L
DLCO cor % pred: 66 %
DLCO cor: 14.13 ml/min/mmHg
DLCO unc % pred: 66 %
DLCO unc: 14.13 ml/min/mmHg
FEF 25-75 Post: 2.12 L/sec
FEF 25-75 Pre: 2.43 L/sec
FEF2575-%Change-Post: -12 %
FEF2575-%Pred-Post: 106 %
FEF2575-%Pred-Pre: 122 %
FEV1-%Change-Post: -2 %
FEV1-%Pred-Post: 108 %
FEV1-%Pred-Pre: 111 %
FEV1-Post: 2.31 L
FEV1-Pre: 2.37 L
FEV1FVC-%Change-Post: 4 %
FEV1FVC-%Pred-Pre: 101 %
FEV6-%Change-Post: -6 %
FEV6-%Pred-Post: 105 %
FEV6-%Pred-Pre: 113 %
FEV6-Post: 2.77 L
FEV6-Pre: 2.97 L
FEV6FVC-%Change-Post: 0 %
FEV6FVC-%Pred-Post: 103 %
FEV6FVC-%Pred-Pre: 103 %
FVC-%Change-Post: -6 %
FVC-%Pred-Post: 102 %
FVC-%Pred-Pre: 109 %
FVC-Post: 2.77 L
FVC-Pre: 2.98 L
Post FEV1/FVC ratio: 83 %
Post FEV6/FVC ratio: 100 %
Pre FEV1/FVC ratio: 80 %
Pre FEV6/FVC Ratio: 100 %
RV % pred: 115 %
RV: 2.56 L
TLC % pred: 102 %
TLC: 5.5 L

## 2020-04-25 MED ORDER — SPIRIVA HANDIHALER 18 MCG IN CAPS: 18.0000 ug | ORAL_CAPSULE | Freq: Every day | RESPIRATORY_TRACT | 12 refills | Status: DC

## 2020-04-25 MED ORDER — ALBUTEROL SULFATE HFA 108 (90 BASE) MCG/ACT IN AERS
2.0000 | INHALATION_SPRAY | Freq: Four times a day (QID) | RESPIRATORY_TRACT | 11 refills | Status: DC | PRN
Start: 2020-04-25 — End: 2021-05-08

## 2020-04-25 NOTE — Assessment & Plan Note (Signed)
Improved some with the change to ARB, with the addition of pantoprazole.  Her upper airway irritation has been a bit more bothersome with the cooler weather.  Plan to continue same regimen.

## 2020-04-25 NOTE — Addendum Note (Signed)
Addended by: Dorisann Frames R on: 04/25/2020 12:30 PM   Modules accepted: Orders

## 2020-04-25 NOTE — Progress Notes (Signed)
Subjective:    Patient ID: Anna Mcguire, female    DOB: 01-20-1956, 64 y.o.   MRN: [id number]  HPI 64 yo active smoker (25+ pack years) with a history of GERD, HTN, arthritis, alcohol use, chronic rhinitis with associated cough.  She carries a presumed diagnosis of COPD, has been treated as an outpatient for acute exacerbations and was admitted 01/12/2020 for acute exacerbation as well as apparent SIADH with associated hyponatremia.  She is on lisinopril for her hypertension. Loratadine for rhinitis.  Formally on a PPI but not currently.  Has albuterol and atrovent since hospitalization.  She uses prn. She doesn;t believe she has Spiriva although its on her med list.  She is smoking about 5 cig a day.   She reports progressive exertional SOB going back 6-9 months. Can happen at rest as well. She has daily cough that will often flare and then makes her breathing decompensate. Her cough produces white mucous. She reports severe GERD, not currently on PPI.   CXR 11/23/19 reviewed, shows hyperinflation, some chronic bronchitic changes   ROV 04/25/20 --follow-up visit for 64 year old active smoker with GERD, hypertension, alcohol use, chronic rhinitis.  She has had episodes consistent with COPD exacerbation in the past.  Also with a history of chronic hyponatremia, question SIADH.  Last visit we restarted Spiriva, continued albuterol as needed and stopped ipratropium which she was using as needed.  Also changed her lisinopril to irbesartan given her chronic cough, restarted pantoprazole. She is down to 5 cig a day.  Since last time, she has noticed some increased throat congestion, morning nasal congestion. Her GERD is much better on the pantoprazole. She tolerated change to irbesartan. Uses albuterol 2-3x a day lately since the weather changed cooler - needs refill on this. She believes the Spiriva helps her as well.   Pulmonary function testing performed today, reviewed by me, shows normal  airflows without a bronchodilator response, normal flow volume loop, normal lung volumes, slightly decreased diffusion capacity that does not correct when adjusted for alveolar volume.   Review of Systems As per hpi      Objective:   Physical Exam Vitals:   04/25/20 1204  BP: (!) 142/74  Pulse: 79  Temp: (!) 97 F (36.1 C)  TempSrc: Temporal  SpO2: 99%  Weight: 122 lb 6.4 oz (55.5 kg)  Height: 5\' 6"  (1.676 m)   Gen: Thin elderly woman, in no distress,  normal affect, no cough  ENT: No lesions,  mouth clear,  oropharynx clear, no postnasal drip, hoarse voice  Neck: no stridor  Lungs: No use of accessory muscles, no crackles or wheezing on normal respiration, some UA noise, no wheeze on forced exp  Cardiovascular: RRR, heart sounds normal, no murmur or gallops, no peripheral edema  Musculoskeletal: No deformities, no cyanosis or clubbing  Neuro: alert, awake, non focal  Skin: Warm, no lesions or rash      Assessment & Plan:  Chronic obstructive pulmonary disease (HCC) Her pulmonary function testing actually looks quite good with grossly normal airflows.  She does have clinical symptoms consistent with COPD.  Perhaps some of this is related to upper airway irritation syndrome (which she also has).  I think for now given her clinical benefit we should continue the Spiriva HandiHaler, albuterol as needed.  No indication to ramp up bronchodilator therapy given the reassuring PFT.  Her COVID-19 vaccine, flu vaccine are both up-to-date.  Tobacco use Smoking about 5 cigarettes daily.  She has cut down  significantly.  Encouraged her with this.  She will continue to try to slowly wean.  We may be able to talk about setting a quit date at her next office visit.  Chronic cough Improved some with the change to ARB, with the addition of pantoprazole.  Her upper airway irritation has been a bit more bothersome with the cooler weather.  Plan to continue same regimen.  Levy Pupa,  MD, PhD 04/25/2020, 12:26 PM [facility] [phone] or if no answer [phone]

## 2020-04-25 NOTE — Assessment & Plan Note (Signed)
Her pulmonary function testing actually looks quite good with grossly normal airflows.  She does have clinical symptoms consistent with COPD.  Perhaps some of this is related to upper airway irritation syndrome (which she also has).  I think for now given her clinical benefit we should continue the Spiriva HandiHaler, albuterol as needed.  No indication to ramp up bronchodilator therapy given the reassuring PFT.  Her COVID-19 vaccine, flu vaccine are both up-to-date.

## 2020-04-25 NOTE — Progress Notes (Signed)
PFT done today. 

## 2020-04-25 NOTE — Assessment & Plan Note (Signed)
Smoking about 5 cigarettes daily.  She has cut down significantly.  Encouraged her with this.  She will continue to try to slowly wean.  We may be able to talk about setting a quit date at her next office visit.

## 2020-04-25 NOTE — Patient Instructions (Addendum)
We will continue Spiriva 1 inhalation once daily.  Take it every day as your maintenance medication.  We will refill this for you today. You can use albuterol either 2 puffs from your inhaler or 1 nebulizer treatment up to every 4 hours if needed for shortness of breath, chest tightness, wheezing. We will continue your irbesartan 75 mg once daily (new blood pressure pill) We will continue pantoprazole 40 mg once daily.  Take this 1 hour around food Congratulations on decreasing your cigarettes.  Keep up the good work.  Continue to try to decrease. COVID-19 vaccine and flu shot are both up-to-date Follow with Dr Delton Coombes in 6 months or sooner if you have any problems

## 2020-04-26 ENCOUNTER — Telehealth: Payer: Self-pay | Admitting: Emergency Medicine

## 2020-04-26 NOTE — Telephone Encounter (Signed)
ATC patient, reached vm, left message that I did not see a form for Dr. Delton Coombes to fill out for a nurse to come to her home.  I advised that it be faxed to [place] at [phone].

## 2020-04-27 ENCOUNTER — Telehealth: Payer: Self-pay | Admitting: Emergency Medicine

## 2020-04-27 NOTE — Telephone Encounter (Signed)
Lm x2 for patient.  Will close encounter per office protocol.   

## 2020-04-27 NOTE — Telephone Encounter (Signed)
Please see 04/26/2020 phone note.  Lm for patient.

## 2020-05-01 NOTE — Telephone Encounter (Signed)
lmtcb for pt.  

## 2020-05-02 NOTE — Telephone Encounter (Signed)
Closing per protocol 

## 2020-05-15 ENCOUNTER — Telehealth: Payer: Self-pay | Admitting: Family Medicine

## 2020-05-15 NOTE — Telephone Encounter (Signed)
Called patient and left voicemail informing them that their appointment tomorrow will be virtual, not to come into the office, and the nurse will be contacting her a few minutes before appt time.

## 2020-05-16 ENCOUNTER — Ambulatory Visit (INDEPENDENT_AMBULATORY_CARE_PROVIDER_SITE_OTHER): Payer: Medicare Other | Admitting: Podiatry

## 2020-05-16 ENCOUNTER — Other Ambulatory Visit: Payer: Self-pay

## 2020-05-16 ENCOUNTER — Ambulatory Visit: Payer: Medicare Other | Attending: Physician Assistant | Admitting: Physician Assistant

## 2020-05-16 ENCOUNTER — Encounter: Payer: Self-pay | Admitting: Podiatry

## 2020-05-16 ENCOUNTER — Encounter: Payer: Self-pay | Admitting: Physician Assistant

## 2020-05-16 DIAGNOSIS — M79671 Pain in right foot: Secondary | ICD-10-CM

## 2020-05-16 DIAGNOSIS — L84 Corns and callosities: Secondary | ICD-10-CM | POA: Diagnosis not present

## 2020-05-16 DIAGNOSIS — M2042 Other hammer toe(s) (acquired), left foot: Secondary | ICD-10-CM

## 2020-05-16 DIAGNOSIS — M2041 Other hammer toe(s) (acquired), right foot: Secondary | ICD-10-CM

## 2020-05-16 DIAGNOSIS — M79675 Pain in left toe(s): Secondary | ICD-10-CM | POA: Diagnosis not present

## 2020-05-16 DIAGNOSIS — M79674 Pain in right toe(s): Secondary | ICD-10-CM | POA: Diagnosis not present

## 2020-05-16 DIAGNOSIS — B351 Tinea unguium: Secondary | ICD-10-CM

## 2020-05-16 DIAGNOSIS — M25511 Pain in right shoulder: Secondary | ICD-10-CM | POA: Diagnosis not present

## 2020-05-16 DIAGNOSIS — M25561 Pain in right knee: Secondary | ICD-10-CM | POA: Diagnosis not present

## 2020-05-16 DIAGNOSIS — Z09 Encounter for follow-up examination after completed treatment for conditions other than malignant neoplasm: Secondary | ICD-10-CM

## 2020-05-16 DIAGNOSIS — M79672 Pain in left foot: Secondary | ICD-10-CM | POA: Diagnosis not present

## 2020-05-16 DIAGNOSIS — J3489 Other specified disorders of nose and nasal sinuses: Secondary | ICD-10-CM | POA: Diagnosis not present

## 2020-05-16 MED ORDER — MUPIROCIN 2 % EX OINT: 1.0000 "application " | TOPICAL_OINTMENT | Freq: Two times a day (BID) | CUTANEOUS | 0 refills | Status: DC

## 2020-05-16 MED ORDER — NAPROXEN 500 MG PO TABS: 500.0000 mg | ORAL_TABLET | Freq: Two times a day (BID) | ORAL | 0 refills | Status: DC

## 2020-05-16 MED ORDER — METHOCARBAMOL 500 MG PO TABS: 500.0000 mg | ORAL_TABLET | Freq: Three times a day (TID) | ORAL | 0 refills | Status: DC | PRN

## 2020-05-16 NOTE — Progress Notes (Signed)
Patient ID: Anna Mcguire, female   DOB: 10-20-1955, 64 y.o.   MRN: [id number] Virtual Visit via Telephone Note  I connected with Anna Mcguire on 05/16/20 at  1:30 PM EST by telephone and verified that I am speaking with the correct person using two identifiers.  Location: Patient: Anna Mcguire Provider: Georgian Co, PA-C   I discussed the limitations, risks, security and privacy concerns of performing an evaluation and management service by telephone and the availability of in person appointments. I also discussed with the patient that there may be a patient responsible charge related to this service. The patient expressed understanding and agreed to proceed.  PATIENT visit by telephone virtually in the context of Covid-19 pandemic. Patient location:  home My Location:  [facility] office Persons on the call: me and the patient.  Screened by Carolynne Edouard     History of Present Illness: After ED visit  04/14/2020 after sustaining a fall.  She continues to have discomfort of the R shoulder and R knee.  Imaging was benign.  She does not have anything for the pain.  She also has a sore in her nose that won't heal and is causing her nose to bleed a little at times.    From ED A/P: Patient presents to the emergency department status post mechanical fall with complaints of pain in multiple locations.  She is nontoxic, resting comfortably, her vitals are within normal limits.  X-rays of the right shoulder and right knee were ordered by triage team.  I have additionally ordered a head CT, cervical spine CT, and x-rays of the right hip and foot.  I personally reviewed and interpreted all imaging.  And agree with radiologist interpretation.  Overall patient without significant traumatic injury.  She has no chest or abdominal tenderness to raise concern for acute intrathoracic/abdominal trauma.  She has no midline spinal tenderness or focal neurologic deficits to raise concern for  significant head/spinal injury.  She is neurovascularly intact distally to all areas of musculoskeletal discomfort.  Patient is ambulatory.  She did have an episode of a coughing spell during which we ordered her a spacer and albuterol inhaler, she had her own inhaler to use with a spacer, she took a couple puffs of this with complete resolution of her coughing spell.  Repeat lung exam without adventitious breath sounds.  She states it feels similar to coughing spells she has with her COPD at home that are relieved with inhaler.  Clinically does not seem like COPD exacerbation requiring steroids or admission.  She feels ready to go home and appears appropriate for discharge at this time. I discussed results, treatment plan, need for follow-up, and return precautions with the patient. Provided opportunity for questions, patient confirmed understanding and is in agreement with plan.    Observations/Objective:   Assessment and Plan: 1. Acute pain of right shoulder - naproxen (NAPROSYN) 500 MG tablet; Take 1 tablet (500 mg total) by mouth 2 (two) times daily with a meal. Prn pain  Dispense: 60 tablet; Refill: 0 - methocarbamol (ROBAXIN) 500 MG tablet; Take 1 tablet (500 mg total) by mouth every 8 (eight) hours as needed for muscle spasms.  Dispense: 90 tablet; Refill: 0 - Ambulatory referral to Orthopedic Surgery  2. Recurrent pain of right knee - naproxen (NAPROSYN) 500 MG tablet; Take 1 tablet (500 mg total) by mouth 2 (two) times daily with a meal. Prn pain  Dispense: 60 tablet; Refill: 0 - methocarbamol (ROBAXIN) 500 MG tablet; Take  1 tablet (500 mg total) by mouth every 8 (eight) hours as needed for muscle spasms.  Dispense: 90 tablet; Refill: 0 - Ambulatory referral to Orthopedic Surgery  3. Sore in nose - mupirocin ointment (BACTROBAN) 2 %; Apply 1 application topically 2 (two) times daily. Into the nose for 1 week  Dispense: 22 g; Refill: 0  4. Hospital discharge follow-up   Follow Up  Instructions: See PCP in January as planned   I discussed the assessment and treatment plan with the patient. The patient was provided an opportunity to ask questions and all were answered. The patient agreed with the plan and demonstrated an understanding of the instructions.   The patient was advised to call back or seek an in-person evaluation if the symptoms worsen or if the condition fails to improve as anticipated.  I provided 14 minutes of non-face-to-face time during this encounter.   Georgian Co, PA-C

## 2020-05-18 ENCOUNTER — Other Ambulatory Visit: Payer: Self-pay | Admitting: Internal Medicine

## 2020-05-18 DIAGNOSIS — F1020 Alcohol dependence, uncomplicated: Secondary | ICD-10-CM

## 2020-05-20 NOTE — Progress Notes (Signed)
Subjective:  Patient ID: Anna Mcguire, female    DOB: 1955/09/23,  MRN: [id number]  Anna Mcguire presents to clinic today for painful thick toenails that are difficult to trim. Pain interferes with ambulation. Aggravating factors include wearing enclosed shoe gear. Pain is relieved with periodic professional debridement..  64 y.o. female presents with the above complaint.    Review of Systems: Negative except as noted in the HPI. Past Medical History:  Diagnosis Date  . Acid reflux   . Arthritis   . Hypertension    Past Surgical History:  Procedure Laterality Date  . COLONOSCOPY WITH PROPOFOL N/A 06/07/2014   Procedure: COLONOSCOPY WITH PROPOFOL;  Surgeon: Willis Modena, MD;  Location: [facility];  Service: Endoscopy;  Laterality: N/A;  . ESOPHAGOGASTRODUODENOSCOPY (EGD) WITH PROPOFOL N/A 06/07/2014   Procedure: ESOPHAGOGASTRODUODENOSCOPY (EGD) WITH PROPOFOL;  Surgeon: Willis Modena, MD;  Location: [facility];  Service: Endoscopy;  Laterality: N/A;    Current Outpatient Medications:  .  albuterol (PROVENTIL) (2.5 MG/3ML) 0.083% nebulizer solution, Take 3 mLs (2.5 mg total) by nebulization every 6 (six) hours as needed for wheezing or shortness of breath., Disp: 150 mL, Rfl: 6 .  albuterol (VENTOLIN HFA) 108 (90 Base) MCG/ACT inhaler, Inhale 2 puffs into the lungs every 6 (six) hours as needed for wheezing or shortness of breath., Disp: 18 g, Rfl: 11 .  amLODipine (NORVASC) 10 MG tablet, Take 1 tablet (10 mg total) by mouth daily. To lower blood pressure, Disp: 90 tablet, Rfl: 1 .  atorvastatin (LIPITOR) 80 MG tablet, Take 1 tablet (80 mg total) by mouth daily., Disp: 90 tablet, Rfl: 2 .  folic acid (FOLVITE) 1 MG tablet, TAKE 1 TABLET(1 MG) BY MOUTH DAILY, Disp: 30 tablet, Rfl: 3 .  irbesartan (AVAPRO) 75 MG tablet, Take 1 tablet (75 mg total) by mouth daily., Disp: 30 tablet, Rfl: 5 .  lisinopril (ZESTRIL) 5 MG tablet, Take 5 mg by mouth daily., Disp: , Rfl:  .   loratadine (CLARITIN) 10 MG tablet, Take 1 tablet (10 mg total) by mouth daily. (Patient not taking: Reported on 04/25/2020), Disp: 90 tablet, Rfl: 2 .  methocarbamol (ROBAXIN) 500 MG tablet, Take 1 tablet (500 mg total) by mouth every 8 (eight) hours as needed for muscle spasms., Disp: 90 tablet, Rfl: 0 .  mupirocin ointment (BACTROBAN) 2 %, Apply 1 application topically 2 (two) times daily. Into the nose for 1 week, Disp: 22 g, Rfl: 0 .  naproxen (NAPROSYN) 500 MG tablet, Take 1 tablet (500 mg total) by mouth 2 (two) times daily with a meal. Prn pain, Disp: 60 tablet, Rfl: 0 .  pantoprazole (PROTONIX) 40 MG tablet, Take 1 tablet (40 mg total) by mouth daily., Disp: 30 tablet, Rfl: 5 .  thiamine (VITAMIN B-1) 100 MG tablet, TAKE 1 TABLET (100 MG TOTAL) BY MOUTH DAILY., Disp: , Rfl:  .  thiamine 100 MG tablet, Take 1 tablet (100 mg total) by mouth daily. (Patient not taking: Reported on 04/25/2020), Disp: 30 tablet, Rfl: 0 .  tiotropium (SPIRIVA HANDIHALER) 18 MCG inhalation capsule, Place 1 capsule (18 mcg total) into inhaler and inhale daily., Disp: 30 capsule, Rfl: 12 .  tiotropium (SPIRIVA) 18 MCG inhalation capsule, Place 1 capsule (18 mcg total) into inhaler and inhale daily. (Patient not taking: Reported on 04/25/2020), Disp: 30 capsule, Rfl: 5 Allergies  Allergen Reactions  . Penicillins Diarrhea   Social History   Occupational History  . Not on file  Tobacco Use  . Smoking status:  Current Every Day Smoker    Packs/day: 0.50    Years: 48.00    Pack years: 24.00    Types: Cigarettes  . Smokeless tobacco: Never Used  . Tobacco comment: 5 cigarettes smoked daily 04/25/20 [facility]   Substance and Sexual Activity  . Alcohol use: Yes    Comment: occ  . Drug use: Yes    Types: Marijuana  . Sexual activity: Not Currently    Objective:   Constitutional Anna Mcguire is a pleasant 64 y.o. African American female, in NAD.Marland Kitchen AAO x 3.   Vascular Capillary refill time to digits  immediate b/l. Palpable pedal pulses b/l LE. Pedal hair sparse. Lower extremity skin temperature gradient within normal limits. No pain with calf compression b/l. No edema noted b/l lower extremities.  No cyanosis or clubbing noted.  Neurologic Normal speech. Oriented to person, place, and time.. Protective sensation intact 5/5 intact bilaterally with 10g monofilament b/l. Vibratory sensation intact b/l. Proprioception intact bilaterally.  Dermatologic Pedal skin with normal turgor, texture and tone bilaterally. No open wounds bilaterally. No interdigital macerations bilaterally. Toenails 1-5 b/l elongated, discolored, dystrophic, thickened, crumbly with subungual debris and tenderness to dorsal palpation. Hyperkeratotic lesion(s) R 3rd toe and submet head 5 right foot.  No erythema, no edema, no drainage, no fluctuance.  Orthopedic: Normal muscle strength 5/5 to all lower extremity muscle groups bilaterally. No pain crepitus or joint limitation noted with ROM b/l. Hallux valgus with bunion deformity noted b/l lower extremities. Hammertoes noted to the b/l lower extremities.   Radiographs: None Assessment:   1. Pain due to onychomycosis of toenails of both feet   2. Corns and callosities   3. Acquired hammertoes of both feet   4. Pain in both feet    Plan:  Patient was evaluated and treated and all questions answered.  Onychomycosis with pain -Nails palliatively debridement as below -Educated on self-care  Procedure: Nail Debridement Rationale: Pain Type of Debridement: manual, sharp debridement. Instrumentation: Nail nipper, rotary burr. Number of Nails: 10 -Examined patient. -No new findings. No new orders. -Toenails 1-5 b/l were debrided in length and girth with sterile nail nippers and dremel without iatrogenic bleeding.  -Corn(s) R 3rd toe and callus(es) submet head 5 right foot were pared utilizing sterile scalpel blade without incident. Total number debrided =2. -Patient to  report any pedal injuries to medical professional immediately.  Return in about 3 months (around 08/16/2020) for painful mycotic toenails.  Freddie Breech, DPM

## 2020-05-29 ENCOUNTER — Other Ambulatory Visit: Payer: Self-pay | Admitting: Family Medicine

## 2020-05-29 DIAGNOSIS — Z1231 Encounter for screening mammogram for malignant neoplasm of breast: Secondary | ICD-10-CM

## 2020-05-30 ENCOUNTER — Ambulatory Visit: Payer: Medicare Other

## 2020-06-13 ENCOUNTER — Telehealth: Payer: Self-pay

## 2020-06-13 NOTE — Telephone Encounter (Signed)
Called patient and let them know their appointment with Dr. Jillyn Hidden on 07/16/2020 had been cancelled and needed to be rescheduled with a newprovider since Dr. Jillyn Hidden is no longer at our clinic. Advised patient to call back [phone] to reschedule.

## 2020-07-16 ENCOUNTER — Ambulatory Visit: Payer: Medicare Other | Admitting: Family Medicine

## 2020-08-07 ENCOUNTER — Other Ambulatory Visit: Payer: Self-pay

## 2020-08-07 ENCOUNTER — Encounter: Payer: Self-pay | Admitting: Orthopaedic Surgery

## 2020-08-07 ENCOUNTER — Ambulatory Visit: Payer: Self-pay

## 2020-08-07 ENCOUNTER — Ambulatory Visit (INDEPENDENT_AMBULATORY_CARE_PROVIDER_SITE_OTHER): Payer: Medicare Other | Admitting: Orthopaedic Surgery

## 2020-08-07 VITALS — Ht 66.5 in | Wt 120.0 lb

## 2020-08-07 DIAGNOSIS — M542 Cervicalgia: Secondary | ICD-10-CM | POA: Insufficient documentation

## 2020-08-07 DIAGNOSIS — G8929 Other chronic pain: Secondary | ICD-10-CM

## 2020-08-07 DIAGNOSIS — M25512 Pain in left shoulder: Secondary | ICD-10-CM | POA: Diagnosis not present

## 2020-08-07 DIAGNOSIS — M12812 Other specific arthropathies, not elsewhere classified, left shoulder: Secondary | ICD-10-CM

## 2020-08-07 MED ORDER — BUPIVACAINE HCL 0.25 % IJ SOLN
2.0000 mL | INTRAMUSCULAR | Status: AC | PRN
  Administered 2020-08-07: 2 mL via INTRA_ARTICULAR

## 2020-08-07 NOTE — Progress Notes (Signed)
Office Visit Note   Patient: Anna Mcguire           Date of Birth: [birth year]           MRN: [id number] Visit Date: 08/07/2020              Requested by: No referring provider defined for this encounter. PCP: No primary care provider on file.   Assessment & Plan: Visit Diagnoses:  1. Neck pain   2. Chronic left shoulder pain   3. Rotator cuff arthropathy of left shoulder   4. Cervicalgia     Plan: Mrs. Fidalgo has rotator cuff arthropathy of her left shoulder. The humeral head nearly articulates with the acromium. Will inject the shoulder joint with cortisone. Long discussion regarding her smoking and even surgery for reverse shoulder arthroplasty. She is not ready. I also don't think she is be a good candidate right now.  In addition she has some degenerative changes of the cervical spine which appears to be separate and not related to her shoulder pain. It is minimally symptomatic  Follow-Up Instructions: Return if symptoms worsen or fail to improve.   Orders:  Orders Placed This Encounter  Procedures  . Large Joint Inj: L glenohumeral  . XR Cervical Spine 2 or 3 views  . XR Shoulder Left   No orders of the defined types were placed in this encounter.     Procedures: Large Joint Inj: L glenohumeral on 08/07/2020 4:15 PM Details: 25 G 1.5 in needle, anteromedial approach  Arthrogram: No  Medications: 2 mL bupivacaine 0.25 %  12 mg betamethasone injected in the left shoulder joint with Marcaine Procedure, treatment alternatives, risks and benefits explained, specific risks discussed. Consent was given by the patient. Immediately prior to procedure a time out was called to verify the correct patient, procedure, equipment, support staff and site/side marked as required. Patient was prepped and draped in the usual sterile fashion.       Clinical Data: No additional findings.   Subjective: Chief Complaint  Patient presents with  . Right Shoulder - Pain  .  Left Shoulder - Pain  Patient presents today for bilateral shoulder pain. She said that they have been hurting for about 3 weeks. No known injury. Her left shoulder hurts worse than the right. The pain is located throughout her shoulders and proximal arms. She states that she has some numbness and tingling, and pain located at her posterior neck. Rest helps with her pain. She is right hand dominant.  Has had prior right shoulder problems with x-rays consistent with what appears to be rotator cuff arthropathy. Presently asymptomatic. Still smoking  HPI  Review of Systems   Objective: Vital Signs: Ht 5' 6.5" (1.689 m)   Wt 120 lb (54.4 kg)   BMI 19.08 kg/m   Physical Exam Constitutional:      Appearance: She is well-developed and well-nourished.  HENT:     Mouth/Throat:     Mouth: Oropharynx is clear and moist.  Eyes:     Extraocular Movements: EOM normal.     Pupils: Pupils are equal, round, and reactive to light.  Pulmonary:     Effort: Pulmonary effort is normal.  Skin:    General: Skin is warm and dry.  Neurological:     Mental Status: She is alert and oriented to person, place, and time.  Psychiatric:        Mood and Affect: Mood and affect normal.  Behavior: Behavior normal.     Ortho Exam some limitation of cervical spine motion particularly in extension but no referred pain to either upper extremity. Some neck pain with motion. Some mild loss of rotation of the right and the left. Able to touch her chin to her chest. Considerable loss of motion of her left shoulder with only about 50 degrees of abduction and maybe 70 to 75 degrees of flexion. Considerable weakness and there is some grinding and crepitation and obvious global atrophy about the shoulder girdle musculature related to disuse. Good grip and release. Specialty Comments:  No specialty comments available.  Imaging: XR Cervical Spine 2 or 3 views  Result Date: 08/07/2020 Films of the cervical spine reveal  diffuse degenerative changes in the mid cervical region. There is an anterior listhesis of C4 on 5 and some disc space narrowing at C5-6. There is reversal of the normal cervical lordosis.  XR Shoulder Left  Result Date: 08/07/2020 Films of the left shoulder obtained in several projections. The humeral head nearly articulates with the acromion with only about 1 to 2 mm space. The humeral head is elevated in relationship to the glenoid. No ectopic calcification or acute changes. Films are consistent with severe rotator cuff arthropathy    PMFS History: Patient Active Problem List   Diagnosis Date Noted  . Rotator cuff arthropathy of left shoulder 08/07/2020  . Cervicalgia 08/07/2020  . Chronic cough 03/21/2020  . Alcohol use disorder, moderate, dependence (HCC) 01/30/2020  . 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  . Tobacco dependence 01/30/2020  . Diarrhea of infectious origin 01/30/2020  . Hyponatremia 01/13/2020  . Alcohol abuse 01/13/2020  . Tobacco abuse 01/13/2020  . Chronic diarrhea 01/13/2020  . Hypophosphatemia 01/13/2020  . Hypertension   . Chronic obstructive pulmonary disease (HCC)   . Alcohol use   . Hypokalemia 01/12/2020  . Tobacco use 10/06/2019  . Primary osteoarthritis of left knee 09/08/2019  . Pain in right shoulder 08/11/2019   Past Medical History:  Diagnosis Date  . Acid reflux   . Arthritis   . Hypertension     Family History  Problem Relation Age of Onset  . Kidney failure Mother   . Aneurysm Father     Past Surgical History:  Procedure Laterality Date  . COLONOSCOPY WITH PROPOFOL N/A 06/07/2014   Procedure: COLONOSCOPY WITH PROPOFOL;  Surgeon: Willis Modena, MD;  Location: [facility];  Service: Endoscopy;  Laterality: N/A;  . ESOPHAGOGASTRODUODENOSCOPY (EGD) WITH PROPOFOL N/A 06/07/2014   Procedure: ESOPHAGOGASTRODUODENOSCOPY (EGD) WITH PROPOFOL;  Surgeon: Willis Modena, MD;  Location: [facility];  Service:  Endoscopy;  Laterality: N/A;   Social History   Occupational History  . Not on file  Tobacco Use  . Smoking status: Current Every Day Smoker    Packs/day: 0.50    Years: 48.00    Pack years: 24.00    Types: Cigarettes  . Smokeless tobacco: Never Used  . Tobacco comment: 5 cigarettes smoked daily 04/25/20 [facility]   Substance and Sexual Activity  . Alcohol use: Yes    Comment: occ  . Drug use: Yes    Types: Marijuana  . Sexual activity: Not Currently

## 2020-08-14 ENCOUNTER — Other Ambulatory Visit: Payer: Self-pay

## 2020-08-14 ENCOUNTER — Ambulatory Visit (HOSPITAL_COMMUNITY)
Admission: EM | Admit: 2020-08-14 | Discharge: 2020-08-14 | Disposition: A | Discharge status: Home / Self Care | Payer: Medicare Other | Attending: Emergency Medicine | Admitting: Emergency Medicine

## 2020-08-14 ENCOUNTER — Encounter (HOSPITAL_COMMUNITY): Payer: Self-pay

## 2020-08-14 DIAGNOSIS — Z79899 Other long term (current) drug therapy: Secondary | ICD-10-CM | POA: Insufficient documentation

## 2020-08-14 DIAGNOSIS — E876 Hypokalemia: Secondary | ICD-10-CM | POA: Insufficient documentation

## 2020-08-14 DIAGNOSIS — R439 Unspecified disturbances of smell and taste: Secondary | ICD-10-CM | POA: Insufficient documentation

## 2020-08-14 DIAGNOSIS — I10 Essential (primary) hypertension: Secondary | ICD-10-CM | POA: Insufficient documentation

## 2020-08-14 DIAGNOSIS — B349 Viral infection, unspecified: Secondary | ICD-10-CM | POA: Diagnosis not present

## 2020-08-14 DIAGNOSIS — J449 Chronic obstructive pulmonary disease, unspecified: Secondary | ICD-10-CM | POA: Diagnosis not present

## 2020-08-14 DIAGNOSIS — F17210 Nicotine dependence, cigarettes, uncomplicated: Secondary | ICD-10-CM | POA: Diagnosis not present

## 2020-08-14 DIAGNOSIS — R053 Chronic cough: Secondary | ICD-10-CM | POA: Diagnosis not present

## 2020-08-14 DIAGNOSIS — Z20822 Contact with and (suspected) exposure to covid-19: Secondary | ICD-10-CM | POA: Insufficient documentation

## 2020-08-14 DIAGNOSIS — R112 Nausea with vomiting, unspecified: Secondary | ICD-10-CM | POA: Insufficient documentation

## 2020-08-14 DIAGNOSIS — K219 Gastro-esophageal reflux disease without esophagitis: Secondary | ICD-10-CM | POA: Diagnosis not present

## 2020-08-14 DIAGNOSIS — Z791 Long term (current) use of non-steroidal anti-inflammatories (NSAID): Secondary | ICD-10-CM | POA: Diagnosis not present

## 2020-08-14 DIAGNOSIS — Z880 Allergy status to penicillin: Secondary | ICD-10-CM | POA: Insufficient documentation

## 2020-08-14 DIAGNOSIS — Z7951 Long term (current) use of inhaled steroids: Secondary | ICD-10-CM | POA: Insufficient documentation

## 2020-08-14 DIAGNOSIS — E222 Syndrome of inappropriate secretion of antidiuretic hormone: Secondary | ICD-10-CM | POA: Insufficient documentation

## 2020-08-14 LAB — SARS CORONAVIRUS 2 (TAT 6-24 HRS): SARS Coronavirus 2: NEGATIVE

## 2020-08-14 NOTE — ED Provider Notes (Signed)
[facility]    CSN: [id number] Arrival date & time: 08/14/20  1159      History   Chief Complaint Chief Complaint  Patient presents with  . Emesis  . Dizziness    HPI Anna Mcguire is a 65 y.o. female.   Patient presents with 2-day history of generalized weakness, lightheadedness, headache, congestion, runny nose, nausea, vomiting, diarrhea, abdominal pain, loss of taste.  She denies fever, chills, chest pain, cough, unusual shortness of breath, or other symptoms.  No treatments attempted at home.  Her medical history includes hypertension, hypokalemia, hyponatremia, chronic cough, tobacco dependence, COPD, alcohol abuse, SIADH, arthritis, GERD, chronic diarrhea.  The history is provided by the patient and medical records.    Past Medical History:  Diagnosis Date  . Acid reflux   . Arthritis   . Hypertension     Patient Active Problem List   Diagnosis Date Noted  . Rotator cuff arthropathy of left shoulder 08/07/2020  . Cervicalgia 08/07/2020  . Chronic cough 03/21/2020  . Alcohol use disorder, moderate, dependence (HCC) 01/30/2020  . 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  . Tobacco dependence 01/30/2020  . Diarrhea of infectious origin 01/30/2020  . Hyponatremia 01/13/2020  . Alcohol abuse 01/13/2020  . Tobacco abuse 01/13/2020  . Chronic diarrhea 01/13/2020  . Hypophosphatemia 01/13/2020  . Hypertension   . Chronic obstructive pulmonary disease (HCC)   . Alcohol use   . Hypokalemia 01/12/2020  . Tobacco use 10/06/2019  . Primary osteoarthritis of left knee 09/08/2019  . Pain in right shoulder 08/11/2019    Past Surgical History:  Procedure Laterality Date  . COLONOSCOPY WITH PROPOFOL N/A 06/07/2014   Procedure: COLONOSCOPY WITH PROPOFOL;  Surgeon: Willis Modena, MD;  Location: [facility];  Service: Endoscopy;  Laterality: N/A;  . ESOPHAGOGASTRODUODENOSCOPY (EGD) WITH PROPOFOL N/A 06/07/2014   Procedure:  ESOPHAGOGASTRODUODENOSCOPY (EGD) WITH PROPOFOL;  Surgeon: Willis Modena, MD;  Location: [facility];  Service: Endoscopy;  Laterality: N/A;    OB History   No obstetric history on file.      Home Medications    Prior to Admission medications   Medication Sig Start Date End Date Taking? Authorizing Provider  albuterol (PROVENTIL) (2.5 MG/3ML) 0.083% nebulizer solution Take 3 mLs (2.5 mg total) by nebulization every 6 (six) hours as needed for wheezing or shortness of breath. 04/13/20  Yes Marcine Matar, MD  amLODipine (NORVASC) 10 MG tablet Take 1 tablet (10 mg total) by mouth daily. To lower blood pressure 01/30/20  Yes Marcine Matar, MD  atorvastatin (LIPITOR) 80 MG tablet Take 1 tablet (80 mg total) by mouth daily. 03/19/20  Yes Fulp, Cammie, MD  irbesartan (AVAPRO) 75 MG tablet Take 1 tablet (75 mg total) by mouth daily. 03/21/20  Yes Leslye Peer, MD  lisinopril (ZESTRIL) 5 MG tablet Take 5 mg by mouth daily. 04/18/20  Yes [provider]  pantoprazole (PROTONIX) 40 MG tablet Take 1 tablet (40 mg total) by mouth daily. 04/13/20  Yes Marcine Matar, MD  tiotropium (SPIRIVA HANDIHALER) 18 MCG inhalation capsule Place 1 capsule (18 mcg total) into inhaler and inhale daily. 04/25/20  Yes Leslye Peer, MD  albuterol (VENTOLIN HFA) 108 (90 Base) MCG/ACT inhaler Inhale 2 puffs into the lungs every 6 (six) hours as needed for wheezing or shortness of breath. 04/25/20   Leslye Peer, MD  folic acid (FOLVITE) 1 MG tablet TAKE 1 TABLET(1 MG) BY MOUTH DAILY 05/18/20   Fulp, Cammie,  MD  loratadine (CLARITIN) 10 MG tablet Take 1 tablet (10 mg total) by mouth daily. 03/19/20   Fulp, Cammie, MD  methocarbamol (ROBAXIN) 500 MG tablet Take 1 tablet (500 mg total) by mouth every 8 (eight) hours as needed for muscle spasms. 05/16/20   Anders Simmonds, PA-C  mupirocin ointment (BACTROBAN) 2 % Apply 1 application topically 2 (two) times daily. Into the nose for 1 week 05/16/20    Anders Simmonds, PA-C  naproxen (NAPROSYN) 500 MG tablet Take 1 tablet (500 mg total) by mouth 2 (two) times daily with a meal. Prn pain 05/16/20   Anders Simmonds, PA-C  thiamine (VITAMIN B-1) 100 MG tablet TAKE 1 TABLET (100 MG TOTAL) BY MOUTH DAILY. 01/17/20   [provider]  thiamine 100 MG tablet Take 1 tablet (100 mg total) by mouth daily. 01/18/20   Drema Dallas, MD  tiotropium (SPIRIVA) 18 MCG inhalation capsule Place 1 capsule (18 mcg total) into inhaler and inhale daily. 03/21/20   Leslye Peer, MD  fluticasone (FLONASE) 50 MCG/ACT nasal spray Place 1 spray into both nostrils daily.  05/12/19  [provider]  lisinopril-hydrochlorothiazide (PRINZIDE,ZESTORETIC) 20-12.5 MG per tablet Take 1 tablet by mouth daily.  05/12/19  [provider]  lovastatin (MEVACOR) 20 MG tablet Take 20 mg by mouth daily at 6 PM.  05/12/19  [provider]    Family History Family History  Problem Relation Age of Onset  . Kidney failure Mother   . Aneurysm Father     Social History Social History   Tobacco Use  . Smoking status: Current Every Day Smoker    Packs/day: 0.50    Years: 48.00    Pack years: 24.00    Types: Cigarettes  . Smokeless tobacco: Never Used  . Tobacco comment: 5 cigarettes smoked daily 04/25/20 [facility]   Substance Use Topics  . Alcohol use: Yes    Comment: occ  . Drug use: Yes    Types: Marijuana     Allergies   Penicillins   Review of Systems Review of Systems  Constitutional: Negative for chills and fever.  HENT: Positive for congestion and rhinorrhea. Negative for ear pain and sore throat.   Eyes: Negative for pain and visual disturbance.  Respiratory: Negative for cough and shortness of breath.   Cardiovascular: Negative for chest pain and palpitations.  Gastrointestinal: Positive for abdominal pain, diarrhea, nausea and vomiting.  Genitourinary: Negative for dysuria and hematuria.  Musculoskeletal: Negative for  arthralgias and back pain.  Skin: Negative for color change and rash.  Neurological: Positive for weakness, light-headedness and headaches. Negative for seizures, syncope, facial asymmetry and numbness.  All other systems reviewed and are negative.    Physical Exam Triage Vital Signs ED Triage Vitals  Enc Vitals Group     BP 08/14/20 1214 119/72     Pulse Rate 08/14/20 1214 79     Resp 08/14/20 1214 20     Temp 08/14/20 1214 98 F (36.7 C)     Temp Source 08/14/20 1214 Oral     SpO2 08/14/20 1214 100 %     Weight --      Height --      Head Circumference --      Peak Flow --      Pain Score 08/14/20 1212 6     Pain Loc --      Pain Edu? --      Excl. in GC? --  No data found.  Updated Vital Signs BP 119/72 (BP Location: Right Arm)   Pulse 79   Temp 98 F (36.7 C) (Oral)   Resp 20   SpO2 100%   Visual Acuity Right Eye Distance:   Left Eye Distance:   Bilateral Distance:    Right Eye Near:   Left Eye Near:    Bilateral Near:     Physical Exam Vitals and nursing note reviewed.  Constitutional:      General: She is not in acute distress.    Appearance: She is well-developed and well-nourished. She is not ill-appearing.  HENT:     Head: Normocephalic and atraumatic.     Right Ear: Tympanic membrane normal.     Left Ear: Tympanic membrane normal.     Nose: Nose normal.     Mouth/Throat:     Mouth: Mucous membranes are moist.     Pharynx: Oropharynx is clear.  Eyes:     Conjunctiva/sclera: Conjunctivae normal.  Cardiovascular:     Rate and Rhythm: Normal rate and regular rhythm.     Heart sounds: Normal heart sounds.  Pulmonary:     Effort: Pulmonary effort is normal. No respiratory distress.     Breath sounds: Normal breath sounds.  Abdominal:     Palpations: Abdomen is soft.     Tenderness: There is no abdominal tenderness. There is no guarding or rebound.  Musculoskeletal:        General: No edema.     Cervical back: Neck supple.  Skin:     General: Skin is warm and dry.  Neurological:     General: No focal deficit present.     Mental Status: She is alert and oriented to person, place, and time.     Sensory: No sensory deficit.     Motor: No weakness.  Psychiatric:        Mood and Affect: Mood and affect and mood normal.        Behavior: Behavior normal.      UC Treatments / Results  Labs (all labs ordered are listed, but only abnormal results are displayed) Labs Reviewed  SARS CORONAVIRUS 2 (TAT 6-24 HRS)    EKG   Radiology No results found.  Procedures Procedures (including critical care time)  Medications Ordered in UC Medications - No data to display  Initial Impression / Assessment and Plan / UC Course  I have reviewed the triage vital signs and the nursing notes.  Pertinent labs & imaging results that were available during my care of the patient were reviewed by me and considered in my medical decision making (see chart for details).   Viral illness.  COVID pending.  Instructed patient to self quarantine until the test results are back.  Discussed symptomatic treatment including Tylenol, rest, hydration.  Instructed patient to follow up with PCP if her symptoms are not improving.  Patient agrees to plan of care.    Final Clinical Impressions(s) / UC Diagnoses   Final diagnoses:  Viral illness     Discharge Instructions     Your COVID test is pending.  You should self quarantine until the test result is back.    Take Tylenol or ibuprofen as needed for fever or discomfort.  Rest and keep yourself hydrated.    Follow-up with your primary care provider if your symptoms are not improving.        ED Prescriptions    None     PDMP not reviewed this encounter.  Mickie Bail, NP 08/14/20 [phone]

## 2020-08-14 NOTE — Discharge Instructions (Signed)
Your COVID test is pending.  You should self quarantine until the test result is back.    Take Tylenol or ibuprofen as needed for fever or discomfort.  Rest and keep yourself hydrated.    Follow-up with your primary care provider if your symptoms are not improving.     

## 2020-08-14 NOTE — ED Triage Notes (Signed)
Pt c/o HA, intermittent congestion, runny nose, general weakness, lightheadedness, n/v/d, altered taste, lower abdominal pain for two days.States that she fell onto her rocking chair and c/o right rib pain.   Denies CP, extremity weakness, fever, dysuria, change in urine quality, sore throat, ear pain, increased SOB from COPD baseline.

## 2020-08-22 ENCOUNTER — Ambulatory Visit (INDEPENDENT_AMBULATORY_CARE_PROVIDER_SITE_OTHER): Payer: Medicare Other | Admitting: Podiatry

## 2020-08-22 ENCOUNTER — Other Ambulatory Visit: Payer: Self-pay

## 2020-08-22 ENCOUNTER — Encounter: Payer: Self-pay | Admitting: Podiatry

## 2020-08-22 DIAGNOSIS — M2011 Hallux valgus (acquired), right foot: Secondary | ICD-10-CM

## 2020-08-22 DIAGNOSIS — M2041 Other hammer toe(s) (acquired), right foot: Secondary | ICD-10-CM

## 2020-08-22 DIAGNOSIS — M79672 Pain in left foot: Secondary | ICD-10-CM | POA: Diagnosis not present

## 2020-08-22 DIAGNOSIS — B351 Tinea unguium: Secondary | ICD-10-CM | POA: Diagnosis not present

## 2020-08-22 DIAGNOSIS — M79674 Pain in right toe(s): Secondary | ICD-10-CM | POA: Diagnosis not present

## 2020-08-22 DIAGNOSIS — L84 Corns and callosities: Secondary | ICD-10-CM

## 2020-08-22 DIAGNOSIS — M79671 Pain in right foot: Secondary | ICD-10-CM

## 2020-08-22 DIAGNOSIS — M79675 Pain in left toe(s): Secondary | ICD-10-CM

## 2020-08-22 DIAGNOSIS — M2012 Hallux valgus (acquired), left foot: Secondary | ICD-10-CM

## 2020-08-22 DIAGNOSIS — M2042 Other hammer toe(s) (acquired), left foot: Secondary | ICD-10-CM

## 2020-08-23 ENCOUNTER — Other Ambulatory Visit: Payer: Self-pay | Admitting: Internal Medicine

## 2020-08-23 ENCOUNTER — Other Ambulatory Visit: Payer: Self-pay | Admitting: Emergency Medicine

## 2020-08-23 NOTE — Telephone Encounter (Signed)
Patient was changed from lisinopril to irbesartan by her Pulmonologist. She should not be taking lisinopril.

## 2020-08-23 NOTE — Telephone Encounter (Signed)
   Notes to clinic Is this pt still associated with your practice? No PCP listed, historical provider. Has been seen in your office recently.

## 2020-08-26 NOTE — Progress Notes (Signed)
  Subjective:  Patient ID: Anna Mcguire, female    DOB: 12-20-1955,  MRN: [id number]  Anna Mcguire presents to clinic today for painful thick toenails that are difficult to trim. Pain interferes with ambulation. Aggravating factors include wearing enclosed shoe gear. Pain is relieved with periodic professional debridement..  She voices no new pedal problems on today's visit.  Allergies  Allergen Reactions  . Penicillins Diarrhea   Objective:   Constitutional Arloa Prak is a pleasant 65 y.o. African American female, in NAD.Marland Kitchen AAO x 3.   Vascular Capillary refill time to digits immediate b/l. Palpable pedal pulses b/l LE. Pedal hair sparse. Lower extremity skin temperature gradient within normal limits. No pain with calf compression b/l. No edema noted b/l lower extremities.  No cyanosis or clubbing noted.  Neurologic Normal speech. Oriented to person, place, and time. Protective sensation intact 5/5 intact bilaterally with 10g monofilament b/l. Vibratory sensation intact b/l. Proprioception intact bilaterally.  Dermatologic Pedal skin with normal turgor, texture and tone bilaterally. No open wounds bilaterally. No interdigital macerations bilaterally. Toenails 1-5 b/l elongated, discolored, dystrophic, thickened, crumbly with subungual debris and tenderness to dorsal palpation. Hyperkeratotic lesion(s) R 3rd toe and submet head 5 right foot.  No erythema, no edema, no drainage, no fluctuance.  Orthopedic: Normal muscle strength 5/5 to all lower extremity muscle groups bilaterally. No pain crepitus or joint limitation noted with ROM b/l. Hallux valgus with bunion deformity noted b/l lower extremities. Hammertoes noted to the b/l lower extremities.   Radiographs: None Assessment:   1. Pain due to onychomycosis of toenails of both feet   2. Corns and callosities   3. Acquired hammertoes of both feet   4. Hallux valgus, acquired, bilateral   5. Pain in both feet     Plan:  Patient was evaluated and treated and all questions answered.  Onychomycosis with pain -Nails palliatively debridement as below -Educated on self-care  Procedure: Nail Debridement Rationale: Pain Type of Debridement: manual, sharp debridement. Instrumentation: Nail nipper, rotary burr. Number of Nails: 10 -Examined patient. -No new findings. No new orders. -Toenails 1-5 b/l were debrided in length and girth with sterile nail nippers and dremel without iatrogenic bleeding.  -Corn(s) R 3rd toe and callus(es) submet head 5 right foot were pared utilizing sterile scalpel blade without incident. Total number debrided =2. -Patient to report any pedal injuries to medical professional immediately.  Return in about 3 months (around 11/19/2020).  Freddie Breech, DPM

## 2020-08-27 ENCOUNTER — Other Ambulatory Visit: Payer: Self-pay | Admitting: Internal Medicine

## 2020-08-31 ENCOUNTER — Other Ambulatory Visit: Payer: Self-pay

## 2020-08-31 ENCOUNTER — Emergency Department (HOSPITAL_COMMUNITY)
Admission: EM | Admit: 2020-08-31 | Discharge: 2020-08-31 | Disposition: A | Discharge status: Home / Self Care | Payer: Medicare Other | Attending: Emergency Medicine | Admitting: Emergency Medicine

## 2020-08-31 DIAGNOSIS — I10 Essential (primary) hypertension: Secondary | ICD-10-CM | POA: Insufficient documentation

## 2020-08-31 DIAGNOSIS — J449 Chronic obstructive pulmonary disease, unspecified: Secondary | ICD-10-CM | POA: Diagnosis not present

## 2020-08-31 DIAGNOSIS — T161XXA Foreign body in right ear, initial encounter: Secondary | ICD-10-CM | POA: Diagnosis present

## 2020-08-31 DIAGNOSIS — F17210 Nicotine dependence, cigarettes, uncomplicated: Secondary | ICD-10-CM | POA: Diagnosis not present

## 2020-08-31 DIAGNOSIS — W458XXA Other foreign body or object entering through skin, initial encounter: Secondary | ICD-10-CM | POA: Diagnosis not present

## 2020-08-31 DIAGNOSIS — Z79899 Other long term (current) drug therapy: Secondary | ICD-10-CM | POA: Diagnosis not present

## 2020-08-31 DIAGNOSIS — Z7951 Long term (current) use of inhaled steroids: Secondary | ICD-10-CM | POA: Insufficient documentation

## 2020-08-31 NOTE — ED Provider Notes (Signed)
[facility] EMERGENCY DEPARTMENT Provider Note   CSN: [id number] Arrival date & time: 08/31/20  8144     History Chief Complaint  Patient presents with  . Foreign Body in Ear    Anna Mcguire is a 65 y.o. female.  Right ear, believes it is a bug   Foreign Body in Ear This is a new problem. The current episode started 1 to 2 hours ago. The problem occurs constantly. The problem has not changed since onset.Pertinent negatives include no chest pain, no headaches and no shortness of breath. Nothing aggravates the symptoms. Nothing relieves the symptoms. She has tried nothing for the symptoms. The treatment provided no relief.       Past Medical History:  Diagnosis Date  . Acid reflux   . Arthritis   . Hypertension     Patient Active Problem List   Diagnosis Date Noted  . Rotator cuff arthropathy of left shoulder 08/07/2020  . Cervicalgia 08/07/2020  . Chronic cough 03/21/2020  . Alcohol use disorder, moderate, dependence (HCC) 01/30/2020  . 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  . Tobacco dependence 01/30/2020  . Diarrhea of infectious origin 01/30/2020  . Hyponatremia 01/13/2020  . Alcohol abuse 01/13/2020  . Tobacco abuse 01/13/2020  . Chronic diarrhea 01/13/2020  . Hypophosphatemia 01/13/2020  . Hypertension   . Chronic obstructive pulmonary disease (HCC)   . Alcohol use   . Hypokalemia 01/12/2020  . Tobacco use 10/06/2019  . Primary osteoarthritis of left knee 09/08/2019  . Pain in right shoulder 08/11/2019    Past Surgical History:  Procedure Laterality Date  . COLONOSCOPY WITH PROPOFOL N/A 06/07/2014   Procedure: COLONOSCOPY WITH PROPOFOL;  Surgeon: Willis Modena, MD;  Location: [facility];  Service: Endoscopy;  Laterality: N/A;  . ESOPHAGOGASTRODUODENOSCOPY (EGD) WITH PROPOFOL N/A 06/07/2014   Procedure: ESOPHAGOGASTRODUODENOSCOPY (EGD) WITH PROPOFOL;  Surgeon: Willis Modena, MD;  Location: [facility];  Service: Endoscopy;  Laterality: N/A;     OB History   No obstetric history on file.     Family History  Problem Relation Age of Onset  . Kidney failure Mother   . Aneurysm Father     Social History   Tobacco Use  . Smoking status: Current Every Day Smoker    Packs/day: 0.50    Years: 48.00    Pack years: 24.00    Types: Cigarettes  . Smokeless tobacco: Never Used  . Tobacco comment: 5 cigarettes smoked daily 04/25/20 [facility]   Substance Use Topics  . Alcohol use: Yes    Comment: occ  . Drug use: Yes    Types: Marijuana    Home Medications Prior to Admission medications   Medication Sig Start Date End Date Taking? Authorizing Provider  albuterol (PROVENTIL) (2.5 MG/3ML) 0.083% nebulizer solution Take 3 mLs (2.5 mg total) by nebulization every 6 (six) hours as needed for wheezing or shortness of breath. 04/13/20   Marcine Matar, MD  albuterol (VENTOLIN HFA) 108 (90 Base) MCG/ACT inhaler Inhale 2 puffs into the lungs every 6 (six) hours as needed for wheezing or shortness of breath. 04/25/20   Leslye Peer, MD  amLODipine (NORVASC) 10 MG tablet Take 1 tablet (10 mg total) by mouth daily. To lower blood pressure 01/30/20   Marcine Matar, MD  atorvastatin (LIPITOR) 80 MG tablet Take 1 tablet (80 mg total) by mouth daily. 03/19/20   Fulp, Cammie, MD  folic acid (FOLVITE) 1 MG tablet TAKE 1 TABLET(1 MG) BY  MOUTH DAILY 05/18/20   Fulp, Cammie, MD  irbesartan (AVAPRO) 75 MG tablet TAKE 1 TABLET(75 MG) BY MOUTH DAILY 08/23/20   Leslye Peer, MD  loratadine (CLARITIN) 10 MG tablet Take 1 tablet (10 mg total) by mouth daily. 03/19/20   Fulp, Cammie, MD  methocarbamol (ROBAXIN) 500 MG tablet Take 1 tablet (500 mg total) by mouth every 8 (eight) hours as needed for muscle spasms. 05/16/20   Anders Simmonds, PA-C  mupirocin ointment (BACTROBAN) 2 % Apply 1 application topically 2 (two) times daily. Into the nose for 1 week 05/16/20   Anders Simmonds, PA-C  naproxen  (NAPROSYN) 500 MG tablet Take 1 tablet (500 mg total) by mouth 2 (two) times daily with a meal. Prn pain 05/16/20   Anders Simmonds, PA-C  pantoprazole (PROTONIX) 40 MG tablet Take 1 tablet (40 mg total) by mouth daily. 04/13/20   Marcine Matar, MD  thiamine (VITAMIN B-1) 100 MG tablet TAKE 1 TABLET (100 MG TOTAL) BY MOUTH DAILY. 01/17/20   [provider]  thiamine 100 MG tablet Take 1 tablet (100 mg total) by mouth daily. 01/18/20   Drema Dallas, MD  tiotropium (SPIRIVA HANDIHALER) 18 MCG inhalation capsule Place 1 capsule (18 mcg total) into inhaler and inhale daily. 04/25/20   Leslye Peer, MD  tiotropium (SPIRIVA) 18 MCG inhalation capsule Place 1 capsule (18 mcg total) into inhaler and inhale daily. 03/21/20   Leslye Peer, MD  fluticasone (FLONASE) 50 MCG/ACT nasal spray Place 1 spray into both nostrils daily.  05/12/19  [provider]  lisinopril-hydrochlorothiazide (PRINZIDE,ZESTORETIC) 20-12.5 MG per tablet Take 1 tablet by mouth daily.  05/12/19  [provider]  lovastatin (MEVACOR) 20 MG tablet Take 20 mg by mouth daily at 6 PM.  05/12/19  [provider]    Allergies    Penicillins  Review of Systems   Review of Systems  Constitutional: Negative for chills and fever.  HENT: Positive for ear pain. Negative for congestion, ear discharge and rhinorrhea.   Respiratory: Negative for cough and shortness of breath.   Cardiovascular: Negative for chest pain and palpitations.  Gastrointestinal: Negative for diarrhea, nausea and vomiting.  Genitourinary: Negative for difficulty urinating and dysuria.  Musculoskeletal: Negative for arthralgias and back pain.  Skin: Negative for rash and wound.  Neurological: Negative for light-headedness and headaches.    Physical Exam Updated Vital Signs BP 107/64 (BP Location: Left Arm)   Pulse 78   Temp 98.1 F (36.7 C) (Oral)   Resp 20   SpO2 100%   Physical Exam Vitals and nursing note  reviewed. Exam conducted with a chaperone present.  Constitutional:      General: She is not in acute distress.    Appearance: Normal appearance.  HENT:     Head: Normocephalic and atraumatic.     Left Ear: Tympanic membrane normal.     Ears:     Comments: Bug in right ear canal    Nose: No rhinorrhea.  Eyes:     General:        Right eye: No discharge.        Left eye: No discharge.     Conjunctiva/sclera: Conjunctivae normal.  Cardiovascular:     Rate and Rhythm: Normal rate and regular rhythm.  Pulmonary:     Effort: Pulmonary effort is normal. No respiratory distress.     Breath sounds: No stridor.  Abdominal:     General: Abdomen is flat. There  is no distension.     Palpations: Abdomen is soft.  Musculoskeletal:        General: No tenderness or signs of injury.  Skin:    General: Skin is warm and dry.  Neurological:     General: No focal deficit present.     Mental Status: She is alert. Mental status is at baseline.     Motor: No weakness.  Psychiatric:        Mood and Affect: Mood normal.        Behavior: Behavior normal.     ED Results / Procedures / Treatments   Labs (all labs ordered are listed, but only abnormal results are displayed) Labs Reviewed - No data to display  EKG None  Radiology No results found.  Procedures .Foreign Body Removal  Date/Time: 08/31/2020 7:23 AM Performed by: Sabino Donovan, MD Authorized by: Sabino Donovan, MD  Consent: Verbal consent obtained. Consent given by: patient Body area: ear Location details: right ear  Sedation: Patient sedated: no  Patient restrained: no Localization method: visualized Removal mechanism: forceps Complexity: simple 1 objects recovered. Objects recovered: bug Post-procedure assessment: foreign body removed Patient tolerance: patient tolerated the procedure well with no immediate complications     Medications Ordered in ED Medications - No data to display  ED Course  I have reviewed  the triage vital signs and the nursing notes.  Pertinent labs & imaging results that were available during my care of the patient were reviewed by me and considered in my medical decision making (see chart for details).    MDM Rules/Calculators/A&P                          Bug in right ear, removed grabbed above, no complication, discharged home return precautions provided Final Clinical Impression(s) / ED Diagnoses Final diagnoses:  Foreign body of right ear, initial encounter    Rx / DC Orders ED Discharge Orders    None       Sabino Donovan, MD 08/31/20 [phone]

## 2020-08-31 NOTE — ED Triage Notes (Signed)
Arrived via [facility] from home ambulatory stating she woke up this morning feeling like she has a bug in her right ear. Pt denied pain or any other complaints.

## 2020-09-03 ENCOUNTER — Telehealth: Payer: Self-pay | Admitting: Internal Medicine

## 2020-09-07 NOTE — Telephone Encounter (Signed)
Informed patient that Lisinopril and meloxicam was not on her medication list. She will f/u at her OV.

## 2020-09-07 NOTE — Telephone Encounter (Signed)
Pt called in to follow up on refill request. Pt says that she is unsure if she is suppose to keep taking medication. Advised per the denial message below.

## 2020-10-22 ENCOUNTER — Other Ambulatory Visit: Payer: Self-pay | Admitting: Internal Medicine

## 2020-10-22 DIAGNOSIS — I10 Essential (primary) hypertension: Secondary | ICD-10-CM

## 2020-10-24 ENCOUNTER — Encounter: Payer: Self-pay | Admitting: Primary Care

## 2020-10-24 ENCOUNTER — Ambulatory Visit (INDEPENDENT_AMBULATORY_CARE_PROVIDER_SITE_OTHER): Payer: Medicare Other | Admitting: Primary Care

## 2020-10-24 VITALS — BP 124/74 | HR 89 | Temp 97.8°F | Ht 66.0 in | Wt 115.6 lb

## 2020-10-24 DIAGNOSIS — J301 Allergic rhinitis due to pollen: Secondary | ICD-10-CM | POA: Diagnosis not present

## 2020-10-24 DIAGNOSIS — Z720 Tobacco use: Secondary | ICD-10-CM | POA: Diagnosis not present

## 2020-10-24 DIAGNOSIS — J449 Chronic obstructive pulmonary disease, unspecified: Secondary | ICD-10-CM

## 2020-10-24 DIAGNOSIS — F17200 Nicotine dependence, unspecified, uncomplicated: Secondary | ICD-10-CM | POA: Diagnosis not present

## 2020-10-24 DIAGNOSIS — J309 Allergic rhinitis, unspecified: Secondary | ICD-10-CM | POA: Insufficient documentation

## 2020-10-24 MED ORDER — STIOLTO RESPIMAT 2.5-2.5 MCG/ACT IN AERS: 2.0000 | INHALATION_SPRAY | Freq: Every day | RESPIRATORY_TRACT | 0 refills | Status: DC

## 2020-10-24 MED ORDER — DOXYCYCLINE HYCLATE 100 MG PO TABS: 100.0000 mg | ORAL_TABLET | Freq: Two times a day (BID) | ORAL | 0 refills | Status: DC

## 2020-10-24 NOTE — Progress Notes (Signed)
@Patient  ID: , female    DOB: [birth year], 65 y.o.   MRN: [id number]  Chief Complaint  Patient presents with  . Follow-up    Sore throat, hoarseness for 2 weeks.     Referring provider: [id number], MD  HPI: 65 year old female, current everyday smoker (25+-pack-year history).  Past medical history significant for COPD, chronic cough, allergic rhinitis, arthritis, hypertension, SIADH, alcohol abuse.  Patient of Dr. 71, in office on 04/25/2020.    Pulmonary function testing showed normal airflows without bronchodilator response, slight decreased diffusion capacity that does not correct when adjusted for alveolar volume. Clinical symptoms consistent with COPD. Cough improved some with changed to ARB in addition of pantoprazole. Maintained on Spiriva HandiHaler and albuterol as needed.  10/24/2020- Interim hx  Patient presents today for 55-month follow-up for COPD. She is doing well overall. Reports nasal and chest/throat congestion x 1 week ago. Associated sore throat and voice hoarseness. She tends to get allergy symptoms this time of year. She is taking over the counter claritin. Cough is productive, she gets up dark yellow mucus in the morning which tends to clear throughout the day. She is compliant with Spiriva handihaler. She has been using albuterol rescue inhaler 4-5 times a day. She is still smoking 5-6 cigarettes three days a week. Denies f/c/s, chest tightness, wheezing.    Allergies  Allergen Reactions  . Penicillins Diarrhea    Immunization History  Administered Date(s) Administered  . Influenza,inj,Quad PF,6+ Mos 04/13/2020  . Moderna Sars-Covid-2 Vaccination 11/04/2019, 12/02/2019    Past Medical History:  Diagnosis Date  . Acid reflux   . Arthritis   . Hypertension     Tobacco History: Social History   Tobacco Use  Smoking Status Current Every Day Smoker  . Packs/day: 0.50  . Years: 48.00  . Pack years: 24.00  . Types: Cigarettes   Smokeless Tobacco Never Used  Tobacco Comment   3 times a week - 10/24/2020   Ready to quit: Not Answered Counseling given: Not Answered Comment: 3 times a week - 10/24/2020   Outpatient Medications Prior to Visit  Medication Sig Dispense Refill  . albuterol (PROVENTIL) (2.5 MG/3ML) 0.083% nebulizer solution Take 3 mLs (2.5 mg total) by nebulization every 6 (six) hours as needed for wheezing or shortness of breath. 150 mL 6  . albuterol (VENTOLIN HFA) 108 (90 Base) MCG/ACT inhaler Inhale 2 puffs into the lungs every 6 (six) hours as needed for wheezing or shortness of breath. 18 g 11  . amLODipine (NORVASC) 10 MG tablet TAKE 1 TABLET(10 MG) BY MOUTH DAILY FOR BLOOD PRESSURE 90 tablet 1  . atorvastatin (LIPITOR) 80 MG tablet Take 1 tablet (80 mg total) by mouth daily. 90 tablet 2  . folic acid (FOLVITE) 1 MG tablet TAKE 1 TABLET(1 MG) BY MOUTH DAILY 30 tablet 3  . irbesartan (AVAPRO) 75 MG tablet TAKE 1 TABLET(75 MG) BY MOUTH DAILY 90 tablet 0  . loratadine (CLARITIN) 10 MG tablet Take 1 tablet (10 mg total) by mouth daily. 90 tablet 2  . methocarbamol (ROBAXIN) 500 MG tablet Take 1 tablet (500 mg total) by mouth every 8 (eight) hours as needed for muscle spasms. 90 tablet 0  . mupirocin ointment (BACTROBAN) 2 % Apply 1 application topically 2 (two) times daily. Into the nose for 1 week 22 g 0  . naproxen (NAPROSYN) 500 MG tablet Take 1 tablet (500 mg total) by mouth 2 (two) times daily with a meal. Prn pain 60 tablet  0  . pantoprazole (PROTONIX) 40 MG tablet Take 1 tablet (40 mg total) by mouth daily. 30 tablet 5  . thiamine (VITAMIN B-1) 100 MG tablet TAKE 1 TABLET (100 MG TOTAL) BY MOUTH DAILY.    Marland Kitchen thiamine 100 MG tablet Take 1 tablet (100 mg total) by mouth daily. 30 tablet 0  . tiotropium (SPIRIVA HANDIHALER) 18 MCG inhalation capsule Place 1 capsule (18 mcg total) into inhaler and inhale daily. 30 capsule 12  . tiotropium (SPIRIVA) 18 MCG inhalation capsule Place 1 capsule (18 mcg  total) into inhaler and inhale daily. 30 capsule 5   No facility-administered medications prior to visit.    Review of Systems  Review of Systems  Constitutional: Negative.   HENT: Positive for congestion and postnasal drip.   Respiratory: Positive for cough.   Cardiovascular: Negative.     Physical Exam  BP 124/74 (BP Location: Left Arm, Cuff Size: Normal)   Pulse 89   Temp 97.8 F (36.6 C) (Temporal)   Ht 5\' 6"  (1.676 m)   Wt 115 lb 9.6 oz (52.4 kg)   SpO2 100% Comment: RA  BMI 18.66 kg/m  Physical Exam Constitutional:      Appearance: Normal appearance.  HENT:     Head: Normocephalic and atraumatic.     Mouth/Throat:     Comments: Deferred d/t masking Cardiovascular:     Rate and Rhythm: Normal rate and regular rhythm.  Pulmonary:     Effort: Pulmonary effort is normal.     Breath sounds: Normal breath sounds. No rales.     Comments: Mostly clear, faint wheeze right middle lobe Musculoskeletal:        General: Normal range of motion.  Skin:    General: Skin is warm and dry.  Neurological:     General: No focal deficit present.     Mental Status: She is alert. Mental status is at baseline.  Psychiatric:        Mood and Affect: Mood normal.        Thought Content: Thought content normal.        Judgment: Judgment normal.      Lab Results:  CBC    Component Value Date/Time   WBC 4.8 01/17/2020 0456   RBC 2.51 (L) 01/17/2020 0456   HGB 7.9 (L) 01/17/2020 0456   HGB 11.6 07/29/2019 1610   HCT 23.9 (L) 01/17/2020 0456   HCT 33.8 (L) 07/29/2019 1610   PLT 229 01/17/2020 0456   PLT 305 07/29/2019 1610   MCV 95.2 01/17/2020 0456   MCV 94 07/29/2019 1610   MCH 31.5 01/17/2020 0456   MCHC 33.1 01/17/2020 0456   RDW 12.7 01/17/2020 0456   RDW 12.2 07/29/2019 1610   LYMPHSABS 0.9 01/17/2020 0456   MONOABS 0.5 01/17/2020 0456   EOSABS 0.1 01/17/2020 0456   BASOSABS 0.0 01/17/2020 0456    BMET    Component Value Date/Time   NA 139 01/30/2020 1652    K 5.0 02/09/2020 1112   CL 98 01/30/2020 1652   CO2 26 01/30/2020 1652   GLUCOSE 89 01/30/2020 1652   GLUCOSE 91 01/17/2020 0456   BUN 10 01/30/2020 1652   CREATININE 0.92 01/30/2020 1652   CALCIUM 9.8 01/30/2020 1652   GFRNONAA 65 01/30/2020 1652   GFRAA 75 01/30/2020 1652    BNP No results found for: BNP  ProBNP No results found for: PROBNP  Imaging: No results found.   Assessment & Plan:   Chronic obstructive pulmonary disease (  HCC) - Breathing appears stable, however, she has been using SABA 4-5 times a day. She is compliant with Spiriva handihaler. Recommend trial Stiolto 2 puffs once daily. Sending in Doxycycline100mg  twice daily x 7 days for acute bronchitis symptoms. No indication for oral steriods at this time. FU televisit in 2 weeks.   Tobacco use - Current light smoker > 35 pack year hx - Encourage smoking cessation, advised patient taper use and pick quit date. She can use NRT to help assist.  - Refer patient to lung cancer screening program  Allergic rhinitis - Continue Loratidine 10mg  daily and prn flonase OTC   , NP 10/24/2020

## 2020-10-24 NOTE — Assessment & Plan Note (Signed)
-   Continue Loratidine 10mg  daily and prn flonase OTC

## 2020-10-24 NOTE — Assessment & Plan Note (Signed)
-   Current light smoker > 35 pack year hx - Encourage smoking cessation, advised patient taper use and pick quit date. She can use NRT to help assist.  - Refer patient to lung cancer screening program

## 2020-10-24 NOTE — Assessment & Plan Note (Addendum)
-   Breathing appears stable, however, she has been using SABA 4-5 times a day. She is compliant with Spiriva handihaler. Recommend trial Stiolto 2 puffs once daily. Sending in Doxycycline100mg  twice daily x 7 days for acute bronchitis symptoms. No indication for oral steriods at this time. FU televisit in 2 weeks.

## 2020-10-24 NOTE — Patient Instructions (Addendum)
  Recommendations: - Stop Spiriva Handihaler  - Start Stiolto - take 2 puffs once in the morning  - Use albuterol rescue inhaler every 6 hours only for breakthrough shortness of breath/wheezing   Rx: - Doxycycline 100mg  twice daily x 1 week (take with food and wear sunscreen if outside)  Referral: - Lung cancer screening program re: current smoker > 20 pack year hx   Follow-up: - Televisit in 2 weeks with NP

## 2020-10-30 ENCOUNTER — Ambulatory Visit: Payer: Medicare Other | Admitting: Internal Medicine

## 2020-11-06 ENCOUNTER — Ambulatory Visit: Payer: Medicare Other | Admitting: Family Medicine

## 2020-11-06 NOTE — Progress Notes (Signed)
 err

## 2020-11-07 ENCOUNTER — Other Ambulatory Visit: Payer: Self-pay

## 2020-11-07 ENCOUNTER — Encounter: Payer: Medicare Other | Admitting: Primary Care

## 2020-11-21 ENCOUNTER — Other Ambulatory Visit: Payer: Self-pay | Admitting: Emergency Medicine

## 2020-11-21 NOTE — Telephone Encounter (Signed)
Pt is RX Avapro 75 mcg. Would you like to refill this medication or have her follow up with her PCP.

## 2020-12-04 ENCOUNTER — Ambulatory Visit: Payer: Medicare Other | Admitting: Podiatry

## 2020-12-07 ENCOUNTER — Other Ambulatory Visit: Payer: Self-pay | Admitting: Emergency Medicine

## 2020-12-26 ENCOUNTER — Telehealth: Payer: Self-pay | Admitting: Primary Care

## 2020-12-26 NOTE — Telephone Encounter (Signed)
FYI: Due to multiple unsuccessful attempts to contact pt to schedule lung cancer screening, this referral has been cancelled. Letter sent to pt and referring provider to make them aware.  

## 2020-12-26 NOTE — Telephone Encounter (Signed)
Thank you :)

## 2021-01-21 ENCOUNTER — Other Ambulatory Visit: Payer: Self-pay | Admitting: Internal Medicine

## 2021-01-21 DIAGNOSIS — E785 Hyperlipidemia, unspecified: Secondary | ICD-10-CM

## 2021-01-21 DIAGNOSIS — I10 Essential (primary) hypertension: Secondary | ICD-10-CM

## 2021-01-21 MED ORDER — ATORVASTATIN CALCIUM 80 MG PO TABS: 80.0000 mg | ORAL_TABLET | Freq: Every day | ORAL | 0 refills | Status: DC

## 2021-01-21 NOTE — Telephone Encounter (Signed)
Valid encounter . No future visit at this time  

## 2021-01-21 NOTE — Telephone Encounter (Signed)
Medication: atorvastatin (LIPITOR) 80 MG tablet [[id number]   Has the patient contacted their pharmacy? YES (Agent: If no, request that the patient contact the pharmacy for the refill.) (Agent: If yes, when and what did the pharmacy advise?)  Preferred Pharmacy (with phone number or street name): [facility] [address] [place], [place]. [address] [phone]  Agent: Please be advised that RX refills may take up to 3 business days. We ask that you follow-up with your pharmacy.

## 2021-03-16 ENCOUNTER — Emergency Department (HOSPITAL_COMMUNITY)
Admission: EM | Admit: 2021-03-16 | Discharge: 2021-03-16 | Disposition: A | Discharge status: Home / Self Care | Payer: Medicare Other | Attending: Physician Assistant | Admitting: Physician Assistant

## 2021-03-16 ENCOUNTER — Encounter (HOSPITAL_COMMUNITY): Payer: Self-pay | Admitting: Emergency Medicine

## 2021-03-16 ENCOUNTER — Other Ambulatory Visit: Payer: Self-pay

## 2021-03-16 DIAGNOSIS — I10 Essential (primary) hypertension: Secondary | ICD-10-CM | POA: Diagnosis not present

## 2021-03-16 DIAGNOSIS — Z20822 Contact with and (suspected) exposure to covid-19: Secondary | ICD-10-CM | POA: Insufficient documentation

## 2021-03-16 DIAGNOSIS — J449 Chronic obstructive pulmonary disease, unspecified: Secondary | ICD-10-CM | POA: Insufficient documentation

## 2021-03-16 DIAGNOSIS — F17210 Nicotine dependence, cigarettes, uncomplicated: Secondary | ICD-10-CM | POA: Diagnosis not present

## 2021-03-16 DIAGNOSIS — R059 Cough, unspecified: Secondary | ICD-10-CM | POA: Insufficient documentation

## 2021-03-16 DIAGNOSIS — Z7951 Long term (current) use of inhaled steroids: Secondary | ICD-10-CM | POA: Diagnosis not present

## 2021-03-16 DIAGNOSIS — Z79899 Other long term (current) drug therapy: Secondary | ICD-10-CM | POA: Diagnosis not present

## 2021-03-16 NOTE — Discharge Instructions (Addendum)
You were tested for COVID-19 on today's visit.  You will be called if your COVID-19 test is positive in the next 48 hours.  You may continue treating your symptoms at home with Tylenol, over-the-counter medication.

## 2021-03-16 NOTE — ED Triage Notes (Signed)
Patient coming from home complaint of covid exposure.

## 2021-03-16 NOTE — ED Provider Notes (Signed)
[facility] EMERGENCY DEPARTMENT Provider Note   CSN: [id number] Arrival date & time: 03/16/21  1627     History Chief Complaint  Patient presents with   Covid Exposure    Anna Mcguire is a 65 y.o. female.  65 y.o female with a past medical history of COPD presents to the ED requesting COVID-19 testing after exposure.  She reports her family member who she was with last week, tested positive for COVID-19 infection.  Today she is endorsing some cough, however no increase in oxygen, no shortness of breath.  She denies any chest pain, fever, any other symptoms.  The history is provided by the patient and medical records.      Past Medical History:  Diagnosis Date   Acid reflux    Arthritis    Hypertension     Patient Active Problem List   Diagnosis Date Noted   Allergic rhinitis 10/24/2020   Rotator cuff arthropathy of left shoulder 08/07/2020   Cervicalgia 08/07/2020   Chronic cough 03/21/2020   Alcohol use disorder, moderate, dependence (HCC) 01/30/2020   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   Tobacco dependence 01/30/2020   Diarrhea of infectious origin 01/30/2020   Hyponatremia 01/13/2020   Alcohol abuse 01/13/2020   Tobacco abuse 01/13/2020   Chronic diarrhea 01/13/2020   Hypophosphatemia 01/13/2020   Hypertension    Chronic obstructive pulmonary disease (HCC)    Alcohol use    Hypokalemia 01/12/2020   Tobacco use 10/06/2019   Primary osteoarthritis of left knee 09/08/2019   Pain in right shoulder 08/11/2019    Past Surgical History:  Procedure Laterality Date   COLONOSCOPY WITH PROPOFOL N/A 06/07/2014   Procedure: COLONOSCOPY WITH PROPOFOL;  Surgeon: Willis Modena, MD;  Location: [facility] ENDOSCOPY;  Service: Endoscopy;  Laterality: N/A;   ESOPHAGOGASTRODUODENOSCOPY (EGD) WITH PROPOFOL N/A 06/07/2014   Procedure: ESOPHAGOGASTRODUODENOSCOPY (EGD) WITH PROPOFOL;  Surgeon: Willis Modena, MD;  Location: [facility]  ENDOSCOPY;  Service: Endoscopy;  Laterality: N/A;     OB History   No obstetric history on file.     Family History  Problem Relation Age of Onset   Kidney failure Mother    Aneurysm Father     Social History   Tobacco Use   Smoking status: Every Day    Packs/day: 0.50    Years: 48.00    Pack years: 24.00    Types: Cigarettes   Smokeless tobacco: Never   Tobacco comments:    3 times a week - 10/24/2020  Substance Use Topics   Alcohol use: Yes    Comment: occ   Drug use: Yes    Types: Marijuana    Home Medications Prior to Admission medications   Medication Sig Start Date End Date Taking? Authorizing Provider  albuterol (PROVENTIL) (2.5 MG/3ML) 0.083% nebulizer solution Take 3 mLs (2.5 mg total) by nebulization every 6 (six) hours as needed for wheezing or shortness of breath. 04/13/20   Marcine Matar, MD  albuterol (VENTOLIN HFA) 108 (90 Base) MCG/ACT inhaler Inhale 2 puffs into the lungs every 6 (six) hours as needed for wheezing or shortness of breath. 04/25/20   Leslye Peer, MD  amLODipine (NORVASC) 10 MG tablet TAKE 1 TABLET(10 MG) BY MOUTH DAILY FOR BLOOD PRESSURE 10/22/20   Marcine Matar, MD  atorvastatin (LIPITOR) 80 MG tablet Take 1 tablet (80 mg total) by mouth daily. 01/21/21   Marcine Matar, MD  doxycycline (VIBRA-TABS) 100 MG tablet Take 1 tablet (100  mg total) by mouth 2 (two) times daily. 10/24/20   Glenford Bayley, NP  folic acid (FOLVITE) 1 MG tablet TAKE 1 TABLET(1 MG) BY MOUTH DAILY 05/18/20   Fulp, Cammie, MD  irbesartan (AVAPRO) 75 MG tablet TAKE 1 TABLET(75 MG) BY MOUTH DAILY 12/07/20   Leslye Peer, MD  loratadine (CLARITIN) 10 MG tablet Take 1 tablet (10 mg total) by mouth daily. 03/19/20   Fulp, Cammie, MD  methocarbamol (ROBAXIN) 500 MG tablet Take 1 tablet (500 mg total) by mouth every 8 (eight) hours as needed for muscle spasms. 05/16/20   Anders Simmonds, PA-C  mupirocin ointment (BACTROBAN) 2 % Apply 1 application topically 2  (two) times daily. Into the nose for 1 week 05/16/20   Anders Simmonds, PA-C  naproxen (NAPROSYN) 500 MG tablet Take 1 tablet (500 mg total) by mouth 2 (two) times daily with a meal. Prn pain 05/16/20   Anders Simmonds, PA-C  pantoprazole (PROTONIX) 40 MG tablet Take 1 tablet (40 mg total) by mouth daily. 04/13/20   Marcine Matar, MD  thiamine (VITAMIN B-1) 100 MG tablet TAKE 1 TABLET (100 MG TOTAL) BY MOUTH DAILY. 01/17/20   [provider]  thiamine 100 MG tablet Take 1 tablet (100 mg total) by mouth daily. 01/18/20   Drema Dallas, MD  tiotropium (SPIRIVA) 18 MCG inhalation capsule Place 1 capsule (18 mcg total) into inhaler and inhale daily. 03/21/20   Leslye Peer, MD  Tiotropium Bromide-Olodaterol (STIOLTO RESPIMAT) 2.5-2.5 MCG/ACT AERS Inhale 2 puffs into the lungs daily. 10/24/20   Glenford Bayley, NP  fluticasone (FLONASE) 50 MCG/ACT nasal spray Place 1 spray into both nostrils daily.  05/12/19  [provider]  lisinopril-hydrochlorothiazide (PRINZIDE,ZESTORETIC) 20-12.5 MG per tablet Take 1 tablet by mouth daily.  05/12/19  [provider]  lovastatin (MEVACOR) 20 MG tablet Take 20 mg by mouth daily at 6 PM.  05/12/19  [provider]    Allergies    Penicillins  Review of Systems   Review of Systems  Constitutional:  Negative for fever.  Respiratory:  Positive for cough (at baselin).   Cardiovascular:  Negative for chest pain.  Gastrointestinal:  Negative for abdominal pain.   Physical Exam Updated Vital Signs BP 139/81 (BP Location: Left Arm)   Pulse 73   Temp 98.4 F (36.9 C) (Oral)   Resp 18   SpO2 100%   Physical Exam Vitals and nursing note reviewed.  Constitutional:      Appearance: Normal appearance.  HENT:     Head: Normocephalic and atraumatic.     Mouth/Throat:     Mouth: Mucous membranes are moist.  Eyes:     Pupils: Pupils are equal, round, and reactive to light.  Cardiovascular:     Rate and Rhythm: Normal  rate.  Pulmonary:     Effort: Pulmonary effort is normal.     Comments: Lungs are diminished but without any rales present. Musculoskeletal:     Cervical back: Normal range of motion and neck supple.  Skin:    General: Skin is warm and dry.  Neurological:     Mental Status: She is alert and oriented to person, place, and time.    ED Results / Procedures / Treatments   Labs (all labs ordered are listed, but only abnormal results are displayed) Labs Reviewed  SARS CORONAVIRUS 2 (TAT 6-24 HRS)    EKG None  Radiology No results found.  Procedures Procedures   Medications  Ordered in ED Medications - No data to display  ED Course  I have reviewed the triage vital signs and the nursing notes.  Pertinent labs & imaging results that were available during my care of the patient were reviewed by me and considered in my medical decision making (see chart for details).    MDM Rules/Calculators/A&P    Patient presents to the ED with a request for COVID-19 testing.  She reports a family Pember was exposed to COVID-19, she is not having any symptoms however would like to be tested.  Patient received a test provided in triage, she is asymptomatic otherwise.  Exam is benign with lungs diminished to auscultation but without any rails.  Vitals are within normal limits, no signs of hypoxia.  Patient was discharged from triage room in stable condition.      Portions of this note were generated with Scientist, clinical (histocompatibility and immunogenetics). Dictation errors may occur despite best attempts at proofreading.  Final Clinical Impression(s) / ED Diagnoses Final diagnoses:  Encounter for laboratory testing for COVID-19 virus    Rx / DC Orders ED Discharge Orders     None        Claude Manges, PA-C 03/16/21 1747    Long, Arlyss Repress, MD 03/24/21 [phone]

## 2021-03-17 LAB — SARS CORONAVIRUS 2 (TAT 6-24 HRS): SARS Coronavirus 2: NEGATIVE

## 2021-03-19 ENCOUNTER — Other Ambulatory Visit: Payer: Self-pay

## 2021-03-19 ENCOUNTER — Encounter: Payer: Self-pay | Admitting: Internal Medicine

## 2021-03-19 ENCOUNTER — Other Ambulatory Visit: Payer: Self-pay | Admitting: Emergency Medicine

## 2021-03-19 ENCOUNTER — Ambulatory Visit: Payer: Medicare Other | Attending: Internal Medicine | Admitting: Internal Medicine

## 2021-03-19 VITALS — BP 146/71 | HR 74

## 2021-03-19 DIAGNOSIS — K029 Dental caries, unspecified: Secondary | ICD-10-CM

## 2021-03-19 DIAGNOSIS — M159 Polyosteoarthritis, unspecified: Secondary | ICD-10-CM

## 2021-03-19 DIAGNOSIS — Z23 Encounter for immunization: Secondary | ICD-10-CM

## 2021-03-19 DIAGNOSIS — F1020 Alcohol dependence, uncomplicated: Secondary | ICD-10-CM

## 2021-03-19 DIAGNOSIS — J309 Allergic rhinitis, unspecified: Secondary | ICD-10-CM

## 2021-03-19 DIAGNOSIS — J449 Chronic obstructive pulmonary disease, unspecified: Secondary | ICD-10-CM

## 2021-03-19 DIAGNOSIS — I10 Essential (primary) hypertension: Secondary | ICD-10-CM | POA: Diagnosis not present

## 2021-03-19 DIAGNOSIS — F17210 Nicotine dependence, cigarettes, uncomplicated: Secondary | ICD-10-CM | POA: Diagnosis not present

## 2021-03-19 DIAGNOSIS — F17200 Nicotine dependence, unspecified, uncomplicated: Secondary | ICD-10-CM

## 2021-03-19 MED ORDER — IRBESARTAN 75 MG PO TABS: ORAL_TABLET | ORAL | 2 refills | Status: DC

## 2021-03-19 MED ORDER — BUPROPION HCL ER (SR) 100 MG PO TB12: ORAL_TABLET | ORAL | 1 refills | Status: DC

## 2021-03-19 MED ORDER — AMLODIPINE BESYLATE 10 MG PO TABS: ORAL_TABLET | ORAL | 2 refills | Status: DC

## 2021-03-19 MED ORDER — LORATADINE 10 MG PO TABS: 10.0000 mg | ORAL_TABLET | Freq: Every day | ORAL | 2 refills | Status: DC

## 2021-03-19 MED ORDER — MELOXICAM 15 MG PO TABS: 15.0000 mg | ORAL_TABLET | Freq: Every day | ORAL | 3 refills | Status: DC

## 2021-03-19 NOTE — Patient Instructions (Signed)
Use the Spiriva inhaler daily. Start Buproprion to help you quit smoking.  Try to cut bak on the amount of beer that you consume as discussed today.

## 2021-03-19 NOTE — Progress Notes (Addendum)
Patient ID: Anna Mcguire, female    DOB: Jul 28, 1955  MRN: [id number]  CC: chronic ds management   Subjective: Anna Mcguire is a 65 y.o. female who presents for chronic ds management Her concerns today include:  Patient with history of HTN, HL, tob dep, COPD, OA left knee, EtOH abuse, chronic diarrhea, SIADH    Patient reports being exposed to her brother who was diagnosed with COVID last week.  He is now hospitalized.  She is not having any symptoms.  However she had COVID test done 3 days ago and it was negative.  COPD: Reports using albuterol inhaler 3 times a day.  She saw Dr. Kavin Leech nurse practitioner in April of this year.  She placed her on Stiolto inhaler but patient states she has not been able to get that.  She still has Spiriva but states that she uses it as needed.  Requests refill on generic Claritin for her allergies.  She continues to smoke about 5 cigarettes a day.  She wants to quit.  Reports she did not tolerate the nicotine patches or the gum.  Wanting to try something different. PHQ-9 was positive in the fall of last year.  Patient states that she does not have an issue with major depression but she does get depressed sometimes living alone and then thinking about in dealing with her son who has his own issues. In regards to the alcohol use, she is drinking one 40 ounce beer a day. Blood pressure is elevated.  She is out of one of her blood pressure medications but not sure which 1.  She is supposed to be on Avapro and amlodipine.  Osteoarthritis: Request to be placed back on meloxicam.  Patient Active Problem List   Diagnosis Date Noted   Allergic rhinitis 10/24/2020   Rotator cuff arthropathy of left shoulder 08/07/2020   Cervicalgia 08/07/2020   Chronic cough 03/21/2020   Alcohol use disorder, moderate, dependence (HCC) 01/30/2020   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   Tobacco dependence 01/30/2020   Diarrhea of  infectious origin 01/30/2020   Hyponatremia 01/13/2020   Alcohol abuse 01/13/2020   Tobacco abuse 01/13/2020   Chronic diarrhea 01/13/2020   Hypophosphatemia 01/13/2020   Hypertension    Chronic obstructive pulmonary disease (HCC)    Alcohol use    Hypokalemia 01/12/2020   Tobacco use 10/06/2019   Primary osteoarthritis of left knee 09/08/2019   Pain in right shoulder 08/11/2019     Current Outpatient Medications on File Prior to Visit  Medication Sig Dispense Refill   albuterol (PROVENTIL) (2.5 MG/3ML) 0.083% nebulizer solution Take 3 mLs (2.5 mg total) by nebulization every 6 (six) hours as needed for wheezing or shortness of breath. 150 mL 6   albuterol (VENTOLIN HFA) 108 (90 Base) MCG/ACT inhaler Inhale 2 puffs into the lungs every 6 (six) hours as needed for wheezing or shortness of breath. 18 g 11   atorvastatin (LIPITOR) 80 MG tablet Take 1 tablet (80 mg total) by mouth daily. 90 tablet 0   folic acid (FOLVITE) 1 MG tablet TAKE 1 TABLET(1 MG) BY MOUTH DAILY 30 tablet 3   methocarbamol (ROBAXIN) 500 MG tablet Take 1 tablet (500 mg total) by mouth every 8 (eight) hours as needed for muscle spasms. 90 tablet 0   mupirocin ointment (BACTROBAN) 2 % Apply 1 application topically 2 (two) times daily. Into the nose for 1 week 22 g 0   pantoprazole (PROTONIX) 40 MG tablet Take  1 tablet (40 mg total) by mouth daily. 30 tablet 5   thiamine (VITAMIN B-1) 100 MG tablet TAKE 1 TABLET (100 MG TOTAL) BY MOUTH DAILY.     thiamine 100 MG tablet Take 1 tablet (100 mg total) by mouth daily. 30 tablet 0   tiotropium (SPIRIVA) 18 MCG inhalation capsule Place 1 capsule (18 mcg total) into inhaler and inhale daily. 30 capsule 5   Tiotropium Bromide-Olodaterol (STIOLTO RESPIMAT) 2.5-2.5 MCG/ACT AERS Inhale 2 puffs into the lungs daily. 4 g 0   [DISCONTINUED] fluticasone (FLONASE) 50 MCG/ACT nasal spray Place 1 spray into both nostrils daily.     [DISCONTINUED] lisinopril-hydrochlorothiazide  (PRINZIDE,ZESTORETIC) 20-12.5 MG per tablet Take 1 tablet by mouth daily.     [DISCONTINUED] lovastatin (MEVACOR) 20 MG tablet Take 20 mg by mouth daily at 6 PM.     No current facility-administered medications on file prior to visit.    Allergies  Allergen Reactions   Penicillins Diarrhea    Social History   Socioeconomic History   Marital status: Single    Spouse name: Not on file   Number of children: Not on file   Years of education: Not on file   Highest education level: Not on file  Occupational History   Not on file  Tobacco Use   Smoking status: Every Day    Packs/day: 0.50    Years: 48.00    Pack years: 24.00    Types: Cigarettes   Smokeless tobacco: Never   Tobacco comments:    3 times a week - 10/24/2020  Substance and Sexual Activity   Alcohol use: Yes    Comment: occ   Drug use: Yes    Types: Marijuana   Sexual activity: Not Currently  Other Topics Concern   Not on file  Social History Narrative   Not on file   Social Determinants of Health   Financial Resource Strain: Not on file  Food Insecurity: Not on file  Transportation Needs: Not on file  Physical Activity: Not on file  Stress: Not on file  Social Connections: Not on file  Intimate Partner Violence: Not on file    Family History  Problem Relation Age of Onset   Kidney failure Mother    Aneurysm Father     Past Surgical History:  Procedure Laterality Date   COLONOSCOPY WITH PROPOFOL N/A 06/07/2014   Procedure: COLONOSCOPY WITH PROPOFOL;  Surgeon: Willis Modena, MD;  Location: [facility];  Service: Endoscopy;  Laterality: N/A;   ESOPHAGOGASTRODUODENOSCOPY (EGD) WITH PROPOFOL N/A 06/07/2014   Procedure: ESOPHAGOGASTRODUODENOSCOPY (EGD) WITH PROPOFOL;  Surgeon: Willis Modena, MD;  Location: [facility];  Service: Endoscopy;  Laterality: N/A;    ROS: Review of Systems Negative except as stated above  PHYSICAL EXAM: BP (!) 146/71   Pulse 74   SpO2 100%   Physical  Exam  General appearance - alert, well appearing, and in no distress Mental status - normal mood, behavior, speech, dress, motor activity, and thought processes Neck - supple, no significant adenopathy Mouth: Moist oral mucosa.  She is noted to have several decayed teeth some of which are broken off in the gum. Chest - clear to auscultation, no wheezes, rales or rhonchi, symmetric air entry Heart -regular rate rhythm.  Soft systolic ejection murmur heard along the left sternal border. Extremities -no lower extremity edema.  Depression screen [facility] 2/9 04/13/2020 01/30/2020 10/19/2019  Decreased Interest 2 0 0  Down, Depressed, Hopeless 2 1 0  PHQ - 2 Score 4  1 0  Altered sleeping 1 2 0  Tired, decreased energy 3 2 0  Change in appetite 2 2 0  Feeling bad or failure about yourself  2 2 0  Trouble concentrating 2 0 0  Moving slowly or fidgety/restless 2 0 0  Suicidal thoughts 0 0 0  PHQ-9 Score 16 9 0  Difficult doing work/chores - - Not difficult at all    CMP Latest Ref Rng & Units 02/09/2020 01/30/2020 01/17/2020  Glucose 65 - 99 mg/dL - 89 91  BUN 8 - 27 mg/dL - 10 <1(P)  Creatinine 0.57 - 1.00 mg/dL - 5.09 3.26  Sodium 712 - 144 mmol/L - 139 132(L)  Potassium 3.5 - 5.2 mmol/L 5.0 3.0(L) 3.2(L)  Chloride 96 - 106 mmol/L - 98 102  CO2 20 - 29 mmol/L - 26 22  Calcium 8.7 - 10.3 mg/dL - 9.8 45.8  Total Protein 6.5 - 8.1 g/dL - - 4.7(L)  Total Bilirubin 0.3 - 1.2 mg/dL - - 0.7  Alkaline Phos 38 - 126 U/L - - 61  AST 15 - 41 U/L - - 27  ALT 0 - 44 U/L - - 20   Lipid Panel     Component Value Date/Time   CHOL 159 01/15/2020 0258   TRIG 117 01/15/2020 0258   HDL 103 01/15/2020 0258   CHOLHDL 1.5 01/15/2020 0258   VLDL 23 01/15/2020 0258   LDLCALC 33 01/15/2020 0258    CBC    Component Value Date/Time   WBC 4.8 01/17/2020 0456   RBC 2.51 (L) 01/17/2020 0456   HGB 7.9 (L) 01/17/2020 0456   HGB 11.6 07/29/2019 1610   HCT 23.9 (L) 01/17/2020 0456   HCT 33.8 (L) 07/29/2019  1610   PLT 229 01/17/2020 0456   PLT 305 07/29/2019 1610   MCV 95.2 01/17/2020 0456   MCV 94 07/29/2019 1610   MCH 31.5 01/17/2020 0456   MCHC 33.1 01/17/2020 0456   RDW 12.7 01/17/2020 0456   RDW 12.2 07/29/2019 1610   LYMPHSABS 0.9 01/17/2020 0456   MONOABS 0.5 01/17/2020 0456   EOSABS 0.1 01/17/2020 0456   BASOSABS 0.0 01/17/2020 0456    ASSESSMENT AND PLAN: 1. Essential hypertension Not at goal.  Refill given on both of her blood pressure medications.  Low-salt diet encouraged. - CBC - Comprehensive metabolic panel - Lipid panel - irbesartan (AVAPRO) 75 MG tablet; TAKE 1 TABLET(75 MG) BY MOUTH DAILY  Dispense: 90 tablet; Refill: 2 - amLODipine (NORVASC) 10 MG tablet; TAKE 1 TABLET(10 MG) BY MOUTH DAILY FOR BLOOD PRESSURE  Dispense: 90 tablet; Refill: 2  2. Tobacco dependence Pt is current smoker. Patient advised to quit smoking. Discussed health risks associated with smoking including lung and other types of cancers, chronic lung diseases and CV risks.. Pt ready/not ready to give trail of quitting.   Discussed methods to help quit including quitting cold [place], use of NRT, Chantix and Bupropion.  Pt wanting to try: Patient wants to try the bupropion.  I told her that it would also help improve depressed mood.  Advised to let me know if any increased depressed feelings develop while on the medication. _3_ Minutes spent on counseling. F/U: Assess progress on subsequent visit.  - buPROPion ER (WELLBUTRIN SR) 100 MG 12 hr tablet; 1 tab PO daily x 3 days then 1 tab PO BID. To help with smoking cessation  Dispense: 60 tablet; Refill: 1  3. Chronic obstructive pulmonary disease, unspecified COPD type (HCC) Advised patient  to use the Spiriva every day and in doing so she will find that she may not have to use the albuterol as often.  4. Alcohol use disorder, moderate, dependence (HCC) Encouraged her to try to cut down even more to no more she is agreeable to cutting back to no  more than two 12 ounce beers a day.  If she is successful with this she will then try to cut back to just 1 a day.  5. Dental cavities Recommend getting in with a dentist for routine dental care.  6. Allergic rhinitis, unspecified seasonality, unspecified trigger - loratadine (CLARITIN) 10 MG tablet; Take 1 tablet (10 mg total) by mouth daily.  Dispense: 90 tablet; Refill: 2  7. Osteoarthritis of multiple joints, unspecified osteoarthritis type - meloxicam (MOBIC) 15 MG tablet; Take 1 tablet (15 mg total) by mouth daily.  Dispense: 30 tablet; Refill: 3  8. Need for influenza vaccination 9. Need for vaccination against Streptococcus pneumoniae Both vaccines were supposed to be given today.  However my CMA told me that the patient stated that she needed to leave and she will be back tomorrow to get both vaccines and to go to the lab.    Patient was given the opportunity to ask questions.  Patient verbalized understanding of the plan and was able to repeat key elements of the plan.   Orders Placed This Encounter  Procedures   CBC   Comprehensive metabolic panel   Lipid panel     Requested Prescriptions   Signed Prescriptions Disp Refills   irbesartan (AVAPRO) 75 MG tablet 90 tablet 2    Sig: TAKE 1 TABLET(75 MG) BY MOUTH DAILY   amLODipine (NORVASC) 10 MG tablet 90 tablet 2    Sig: TAKE 1 TABLET(10 MG) BY MOUTH DAILY FOR BLOOD PRESSURE   loratadine (CLARITIN) 10 MG tablet 90 tablet 2    Sig: Take 1 tablet (10 mg total) by mouth daily.   meloxicam (MOBIC) 15 MG tablet 30 tablet 3    Sig: Take 1 tablet (15 mg total) by mouth daily.   buPROPion ER (WELLBUTRIN SR) 100 MG 12 hr tablet 60 tablet 1    Sig: 1 tab PO daily x 3 days then 1 tab PO BID. To help with smoking cessation    No follow-ups on file.  Jonah Blue, MD, FACP

## 2021-03-20 DIAGNOSIS — Z23 Encounter for immunization: Secondary | ICD-10-CM | POA: Diagnosis not present

## 2021-03-21 ENCOUNTER — Telehealth: Payer: Self-pay | Admitting: Internal Medicine

## 2021-03-21 DIAGNOSIS — E875 Hyperkalemia: Secondary | ICD-10-CM

## 2021-03-21 DIAGNOSIS — D539 Nutritional anemia, unspecified: Secondary | ICD-10-CM

## 2021-03-21 DIAGNOSIS — D649 Anemia, unspecified: Secondary | ICD-10-CM

## 2021-03-21 LAB — COMPREHENSIVE METABOLIC PANEL
ALT: 19 IU/L (ref 0–32)
AST: 28 IU/L (ref 0–40)
Albumin/Globulin Ratio: 2.6 — ABNORMAL HIGH (ref 1.2–2.2)
Albumin: 4.9 g/dL — ABNORMAL HIGH (ref 3.8–4.8)
Alkaline Phosphatase: 114 IU/L (ref 44–121)
BUN/Creatinine Ratio: 16 (ref 12–28)
BUN: 20 mg/dL (ref 8–27)
Bilirubin Total: 0.4 mg/dL (ref 0.0–1.2)
CO2: 20 mmol/L (ref 20–29)
Calcium: 11.6 mg/dL — ABNORMAL HIGH (ref 8.7–10.3)
Chloride: 101 mmol/L (ref 96–106)
Creatinine, Ser: 1.23 mg/dL — ABNORMAL HIGH (ref 0.57–1.00)
Globulin, Total: 1.9 g/dL (ref 1.5–4.5)
Glucose: 79 mg/dL (ref 65–99)
Potassium: 5.6 mmol/L — ABNORMAL HIGH (ref 3.5–5.2)
Sodium: 137 mmol/L (ref 134–144)
Total Protein: 6.8 g/dL (ref 6.0–8.5)
eGFR: 48 mL/min/{1.73_m2} — ABNORMAL LOW (ref >59)

## 2021-03-21 LAB — CBC
Hematocrit: 31 % — ABNORMAL LOW (ref 34.0–46.6)
Hemoglobin: 10.4 g/dL — ABNORMAL LOW (ref 11.1–15.9)
MCH: 32.7 pg (ref 26.6–33.0)
MCHC: 33.5 g/dL (ref 31.5–35.7)
MCV: 98 fL — ABNORMAL HIGH (ref 79–97)
Platelets: 316 10*3/uL (ref 150–450)
RBC: 3.18 x10E6/uL — ABNORMAL LOW (ref 3.77–5.28)
RDW: 13.1 % (ref 11.7–15.4)
WBC: 5.3 10*3/uL (ref 3.4–10.8)

## 2021-03-21 LAB — LIPID PANEL
Chol/HDL Ratio: 1.9 ratio (ref 0.0–4.4)
Cholesterol, Total: 155 mg/dL (ref 100–199)
HDL: 82 mg/dL (ref >39)
LDL Chol Calc (NIH): 35 mg/dL (ref 0–99)
Triglycerides: 258 mg/dL — ABNORMAL HIGH (ref 0–149)
VLDL Cholesterol Cal: 38 mg/dL (ref 5–40)

## 2021-03-21 NOTE — Telephone Encounter (Signed)
I attempted to reach pt this a.m via telephone to go over lab results.  Cell # is busy on 2 attempts.  Home phone # rings without answer or VM.  Will try again later. Pt with hypercalcemia and hyperkalemia.  Pt with macrocytic anemia that has improved.  Orders placed for additional lab.  When I reach pt, I will have her return to lab to have these studies done.  Results for orders placed or performed in visit on 03/19/21  CBC  Result Value Ref Range   WBC 5.3 3.4 - 10.8 x10E3/uL   RBC 3.18 (L) 3.77 - 5.28 x10E6/uL   Hemoglobin 10.4 (L) 11.1 - 15.9 g/dL   Hematocrit 31.0 (L) 34.0 - 46.6 %   MCV 98 (H) 79 - 97 fL   MCH 32.7 26.6 - 33.0 pg   MCHC 33.5 31.5 - 35.7 g/dL   RDW 13.1 11.7 - 15.4 %   Platelets 316 150 - 450 x10E3/uL  Comprehensive metabolic panel  Result Value Ref Range   Glucose 79 65 - 99 mg/dL   BUN 20 8 - 27 mg/dL   Creatinine, Ser 1.23 (H) 0.57 - 1.00 mg/dL   eGFR 48 (L) >59 mL/min/1.73   BUN/Creatinine Ratio 16 12 - 28   Sodium 137 134 - 144 mmol/L   Potassium 5.6 (H) 3.5 - 5.2 mmol/L   Chloride 101 96 - 106 mmol/L   CO2 20 20 - 29 mmol/L   Calcium 11.6 (H) 8.7 - 10.3 mg/dL   Total Protein 6.8 6.0 - 8.5 g/dL   Albumin 4.9 (H) 3.8 - 4.8 g/dL   Globulin, Total 1.9 1.5 - 4.5 g/dL   Albumin/Globulin Ratio 2.6 (H) 1.2 - 2.2   Bilirubin Total 0.4 0.0 - 1.2 mg/dL   Alkaline Phosphatase 114 44 - 121 IU/L   AST 28 0 - 40 IU/L   ALT 19 0 - 32 IU/L  Lipid panel  Result Value Ref Range   Cholesterol, Total 155 100 - 199 mg/dL   Triglycerides 258 (H) 0 - 149 mg/dL   HDL 82 >39 mg/dL   VLDL Cholesterol Cal 38 5 - 40 mg/dL   LDL Chol Calc (NIH) 35 0 - 99 mg/dL   Chol/HDL Ratio 1.9 0.0 - 4.4 ratio

## 2021-03-21 NOTE — Addendum Note (Signed)
Addended by: Particia Lather on: 03/21/2021 08:59 AM   Modules accepted: Orders

## 2021-03-21 NOTE — Telephone Encounter (Signed)
irbesartan (AVAPRO) 75 MG tablet [[id number]]   Pharmacy  [facility] - [place], [place] - [address] [facility]  [address], [place] [address]

## 2021-03-21 NOTE — Telephone Encounter (Signed)
Rx was sent on 9/13 when she seen provider

## 2021-03-22 ENCOUNTER — Telehealth: Payer: Self-pay | Admitting: Internal Medicine

## 2021-03-22 NOTE — Telephone Encounter (Signed)
Attempted to reach patient again today to discuss lab results.  Her mobile number is not in service.  Home phone number rings without answer.  I will send her a letter.

## 2021-03-24 ENCOUNTER — Telehealth: Payer: Self-pay | Admitting: Internal Medicine

## 2021-03-24 NOTE — Telephone Encounter (Signed)
Attempted to reach pt again today vis telephone to go over lab results.  Recording from cell phone stating it is not in service.  Home phone rings without answer.

## 2021-03-25 NOTE — Telephone Encounter (Signed)
Letter was mailed on 9/16

## 2021-03-26 ENCOUNTER — Other Ambulatory Visit: Payer: Self-pay | Admitting: Internal Medicine

## 2021-03-26 DIAGNOSIS — K219 Gastro-esophageal reflux disease without esophagitis: Secondary | ICD-10-CM

## 2021-03-29 ENCOUNTER — Ambulatory Visit: Payer: Self-pay

## 2021-03-29 NOTE — Telephone Encounter (Signed)
Patient called in stated that she received her pneumonia and flu shot on 03/20/21 and since that day been having fever, diarrhea, vomiting chills and not been feeling well or able to get out the bed since. Please call patient at Ph# [phone]

## 2021-03-29 NOTE — Telephone Encounter (Signed)
Called patient to attempt to schedule televisit for today at [facility] office.

## 2021-03-29 NOTE — Telephone Encounter (Signed)
Pt.reports she has been sick since 03/20/21. Cough with green mucus,fever on and off, diarrhea. COVID 19 test negative. Reports history of COPD.No availability in the practice. Pt. Will try virtual visit through [email or web address].   Answer Assessment - Initial Assessment Questions 1. ONSET: "When did the cough begin?"      Last week 2. SEVERITY: "How bad is the cough today?"      Moderate 3. SPUTUM: "Describe the color of your sputum" (none, dry cough; clear, white, yellow, green)     Green 4. HEMOPTYSIS: "Are you coughing up any blood?" If so ask: "How much?" (flecks, streaks, tablespoons, etc.)     No 5. DIFFICULTY BREATHING: "Are you having difficulty breathing?" If Yes, ask: "How bad is it?" (e.g., mild, moderate, severe)    - MILD: No SOB at rest, mild SOB with walking, speaks normally in sentences, can lie down, no retractions, pulse < 100.    - MODERATE: SOB at rest, SOB with minimal exertion and prefers to sit, cannot lie down flat, speaks in phrases, mild retractions, audible wheezing, pulse 100-120.    - SEVERE: Very SOB at rest, speaks in single words, struggling to breathe, sitting hunched forward, retractions, pulse > 120      Mild 6. FEVER: "Do you have a fever?" If Yes, ask: "What is your temperature, how was it measured, and when did it start?"     No fever today 7. CARDIAC HISTORY: "Do you have any history of heart disease?" (e.g., heart attack, congestive heart failure)      No 8. LUNG HISTORY: "Do you have any history of lung disease?"  (e.g., pulmonary embolus, asthma, emphysema)     COPD 9. PE RISK FACTORS: "Do you have a history of blood clots?" (or: recent major surgery, recent prolonged travel, bedridden)     No 10. OTHER SYMPTOMS: "Do you have any other symptoms?" (e.g., runny nose, wheezing, chest pain)       Wheezing 11. PREGNANCY: "Is there any chance you are pregnant?" "When was your last menstrual period?"       No 12. TRAVEL: "Have you traveled out of the  country in the last month?" (e.g., travel history, exposures)       No  Protocols used: Cough - Acute Productive-A-AH

## 2021-04-18 ENCOUNTER — Other Ambulatory Visit: Payer: Self-pay | Admitting: Internal Medicine

## 2021-04-18 DIAGNOSIS — E785 Hyperlipidemia, unspecified: Secondary | ICD-10-CM

## 2021-04-19 NOTE — Telephone Encounter (Signed)
Requested Prescriptions  Pending Prescriptions Disp Refills  . atorvastatin (LIPITOR) 80 MG tablet [Pharmacy Med Name: atorvastatin 80 mg tablet] 90 tablet 1    Sig: TAKE 1 Tablet BY MOUTH ONCE DAILY     Cardiovascular:  Antilipid - Statins Failed - 04/18/2021  2:20 PM      Failed - Triglycerides in normal range and within 360 days    Triglycerides  Date Value Ref Range Status  03/20/2021 258 (H) 0 - 149 mg/dL Final         Passed - Total Cholesterol in normal range and within 360 days    Cholesterol, Total  Date Value Ref Range Status  03/20/2021 155 100 - 199 mg/dL Final         Passed - LDL in normal range and within 360 days    LDL Chol Calc (NIH)  Date Value Ref Range Status  03/20/2021 35 0 - 99 mg/dL Final         Passed - HDL in normal range and within 360 days    HDL  Date Value Ref Range Status  03/20/2021 82 >39 mg/dL Final         Passed - Patient is not pregnant      Passed - Valid encounter within last 12 months    Recent Outpatient Visits          1 month ago Essential hypertension   [facility] Marcine Matar, MD   11 months ago Acute pain of right shoulder   [facility] [place], [place], [place]   1 year ago Need for influenza vaccination   [facility] Lois Huxley, Cornelius Moras, RPH-CPP   1 year ago Essential hypertension   [facility] Marcine Matar, MD   1 year ago Hospital discharge follow-up   [facility] Marcine Matar, MD

## 2021-05-07 ENCOUNTER — Other Ambulatory Visit: Payer: Self-pay | Admitting: Internal Medicine

## 2021-05-07 ENCOUNTER — Other Ambulatory Visit: Payer: Self-pay | Admitting: Emergency Medicine

## 2021-05-07 DIAGNOSIS — J449 Chronic obstructive pulmonary disease, unspecified: Secondary | ICD-10-CM

## 2021-05-07 NOTE — Telephone Encounter (Signed)
Requested Prescriptions  Pending Prescriptions Disp Refills  . albuterol (PROVENTIL) (2.5 MG/3ML) 0.083% nebulizer solution [Pharmacy Med Name: albuterol sulfate 2.5 mg/3 mL (0.083 %) solution for nebulization] 150 mL 5    Sig: inhale THE contents of 1 vial PER nebulizer EVERY 6 HOURS AS NEEDED FOR wheezing OR SHORTNESS OF BREATH     Pulmonology:  Beta Agonists Failed - 05/07/2021 10:22 AM      Failed - One inhaler should last at least one month. If the patient is requesting refills earlier, contact the patient to check for uncontrolled symptoms.      Passed - Valid encounter within last 12 months    Recent Outpatient Visits          1 month ago Essential hypertension   [facility] Marcine Matar, MD   11 months ago Acute pain of right shoulder   [facility] [place], [place], [place]   1 year ago Need for influenza vaccination   [facility] Lois Huxley, Cornelius Moras, RPH-CPP   1 year ago Essential hypertension    [facility]   1 year ago Hospital discharge follow-up   [facility] Marcine Matar, MD

## 2021-05-27 ENCOUNTER — Ambulatory Visit: Payer: Self-pay

## 2021-05-27 NOTE — Telephone Encounter (Signed)
Pt returned out call. Pt has had increased urination and back pain for over 1 week. Pt states that pain is 8/10.   No appointments available.  Bus not in service.  Pt sent to UC.   Pt agrees with this plan.      Reason for Disposition  [1] SEVERE back pain (e.g., excruciating, unable to do any normal activities) AND [2] not improved 2 hours after pain medicine  Answer Assessment - Initial Assessment Questions 1. ONSET: "When did the pain begin?"      1 week ago 2. LOCATION: "Where does it hurt?" (upper, mid or lower back)     Back/kidneys 3. SEVERITY: "How bad is the pain?"  (e.g., Scale 1-10; mild, moderate, or severe)   - MILD (1-3): doesn't interfere with normal activities    - MODERATE (4-7): interferes with normal activities or awakens from sleep    - SEVERE (8-10): excruciating pain, unable to do any normal activities      8 4. PATTERN: "Is the pain constant?" (e.g., yes, no; constant, intermittent)      constant 5. RADIATION: "Does the pain shoot into your legs or elsewhere?"     Sometimes down right leg 6. CAUSE:  "What do you think is causing the back pain?" Pt thinks a kidney or urinary infection. Increased urination      7. BACK OVERUSE:  "Any recent lifting of heavy objects, strenuous work or exercise?"     Yes - but over a week ago 8. MEDICATIONS: "What have you taken so far for the pain?" (e.g., nothing, acetaminophen, NSAIDS)     Tylenol 9. NEUROLOGIC SYMPTOMS: "Do you have any weakness, numbness, or problems with bowel/bladder control?"     no 10. OTHER SYMPTOMS: "Do you have any other symptoms?" (e.g., fever, abdominal pain, burning with urination, blood in urine)       Increased urination 11. PREGNANCY: "Is there any chance you are pregnant?" (e.g., yes, no; LMP)       no  Protocols used: Back Pain-A-AH

## 2021-05-27 NOTE — Telephone Encounter (Signed)
Patient called, left VM to return the call to the office to discuss symptoms with a nurse.   Summary: lower back pain   Pt stated her lower back has been hurting and believes it may have something to do with having a UTI or issues with her kidneys, pt needed advice.

## 2021-05-27 NOTE — Telephone Encounter (Signed)
2nd attempt, pt called, left VM. Will attempt again later.   

## 2021-05-28 ENCOUNTER — Ambulatory Visit: Payer: Medicare Other | Attending: Internal Medicine | Admitting: Internal Medicine

## 2021-05-28 ENCOUNTER — Encounter: Payer: Self-pay | Admitting: Internal Medicine

## 2021-05-28 ENCOUNTER — Other Ambulatory Visit: Payer: Self-pay

## 2021-05-28 VITALS — BP 138/74 | HR 76 | Resp 16 | Wt 117.6 lb

## 2021-05-28 DIAGNOSIS — R300 Dysuria: Secondary | ICD-10-CM | POA: Diagnosis not present

## 2021-05-28 DIAGNOSIS — M159 Polyosteoarthritis, unspecified: Secondary | ICD-10-CM | POA: Diagnosis not present

## 2021-05-28 DIAGNOSIS — M5440 Lumbago with sciatica, unspecified side: Secondary | ICD-10-CM | POA: Diagnosis not present

## 2021-05-28 LAB — POCT URINALYSIS DIP (CLINITEK)
Bilirubin, UA: NEGATIVE
Blood, UA: NEGATIVE
Glucose, UA: NEGATIVE mg/dL
Ketones, POC UA: NEGATIVE mg/dL
Leukocytes, UA: NEGATIVE
Nitrite, UA: NEGATIVE
POC PROTEIN,UA: NEGATIVE
Spec Grav, UA: 1.015 (ref 1.010–1.025)
Urobilinogen, UA: 0.2 E.U./dL
pH, UA: 5.5 (ref 5.0–8.0)

## 2021-05-28 MED ORDER — ZOSTER VAC RECOMB ADJUVANTED 50 MCG/0.5ML IM SUSR: 0.5000 mL | Freq: Once | INTRAMUSCULAR | 1 refills | Status: AC

## 2021-05-28 MED ORDER — METHOCARBAMOL 500 MG PO TABS: 500.0000 mg | ORAL_TABLET | Freq: Every day | ORAL | 0 refills | Status: DC | PRN

## 2021-05-28 MED ORDER — MELOXICAM 15 MG PO TABS: 15.0000 mg | ORAL_TABLET | Freq: Every day | ORAL | 3 refills | Status: DC

## 2021-05-28 NOTE — Telephone Encounter (Signed)
Returned call to patient to offer a 9:50 with Dr. Laural Benes this morning no answer. Left vm to call [phone] if she is able come in person or do a virtual

## 2021-05-28 NOTE — Progress Notes (Signed)
Patient ID: Anna Mcguire, female    DOB: 30-Apr-1956  MRN: [id number]  CC: back pain  Subjective: Anna Mcguire is a 65 y.o. female who presents for UC visit Her concerns today include:  Patient with history of HTN, HL, tob dep, COPD, OA left knee, EtOH abuse, chronic diarrhea, SIADH   C/o pain across lower back RT>LT x 1 wk No falls  She was doing some extra cleaning in her house moving furniture around for past 2 wks.  Little lifting and pushing Pain does not radiated but some tingling in RT lateral leg all the way to the foot Pain is constant.  Worse with walking No incontinence of bowel or bladder.  No saddle anesthesia. Rates pain 8/10.  Taking Tylenol 2 x/day and Meloxicam.  They do help.  Knock pain down to 5/10 Some dysuria at times and polyuria.  No fever.      Patient Active Problem List   Diagnosis Date Noted   Allergic rhinitis 10/24/2020   Rotator cuff arthropathy of left shoulder 08/07/2020   Cervicalgia 08/07/2020   Chronic cough 03/21/2020   Alcohol use disorder, moderate, dependence (HCC) 01/30/2020   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   Tobacco dependence 01/30/2020   Diarrhea of infectious origin 01/30/2020   Hyponatremia 01/13/2020   Alcohol abuse 01/13/2020   Tobacco abuse 01/13/2020   Chronic diarrhea 01/13/2020   Hypophosphatemia 01/13/2020   Hypertension    Chronic obstructive pulmonary disease (HCC)    Alcohol use    Hypokalemia 01/12/2020   Tobacco use 10/06/2019   Primary osteoarthritis of left knee 09/08/2019   Pain in right shoulder 08/11/2019     Current Outpatient Medications on File Prior to Visit  Medication Sig Dispense Refill   albuterol (PROVENTIL) (2.5 MG/3ML) 0.083% nebulizer solution inhale THE contents of 1 vial PER nebulizer EVERY 6 HOURS AS NEEDED FOR wheezing OR SHORTNESS OF BREATH 150 mL 5   albuterol (VENTOLIN HFA) 108 (90 Base) MCG/ACT inhaler inhale 2 puffs into THE lungs EVERY 6  HOURS AS NEEDED FOR wheezing OR SHORTNESS OF BREATH 8.5 g 2   amLODipine (NORVASC) 10 MG tablet TAKE 1 TABLET(10 MG) BY MOUTH DAILY FOR BLOOD PRESSURE 90 tablet 2   atorvastatin (LIPITOR) 80 MG tablet TAKE 1 Tablet BY MOUTH ONCE DAILY 90 tablet 1   buPROPion ER (WELLBUTRIN SR) 100 MG 12 hr tablet 1 tab PO daily x 3 days then 1 tab PO BID. To help with smoking cessation 60 tablet 1   folic acid (FOLVITE) 1 MG tablet TAKE 1 TABLET(1 MG) BY MOUTH DAILY 30 tablet 3   irbesartan (AVAPRO) 75 MG tablet TAKE 1 TABLET(75 MG) BY MOUTH DAILY 90 tablet 2   loratadine (CLARITIN) 10 MG tablet Take 1 tablet (10 mg total) by mouth daily. 90 tablet 2   meloxicam (MOBIC) 15 MG tablet Take 1 tablet (15 mg total) by mouth daily. 30 tablet 3   methocarbamol (ROBAXIN) 500 MG tablet Take 1 tablet (500 mg total) by mouth every 8 (eight) hours as needed for muscle spasms. 90 tablet 0   mupirocin ointment (BACTROBAN) 2 % Apply 1 application topically 2 (two) times daily. Into the nose for 1 week 22 g 0   pantoprazole (PROTONIX) 40 MG tablet TAKE ONE TABLET BY MOUTH EVERY DAY 90 tablet 0   SPIRIVA HANDIHALER 18 MCG inhalation capsule place ONE CAPSULE into inhaler AND inhale daily 30 capsule 2   thiamine (VITAMIN B-1) 100 MG  tablet TAKE 1 TABLET (100 MG TOTAL) BY MOUTH DAILY.     thiamine 100 MG tablet Take 1 tablet (100 mg total) by mouth daily. 30 tablet 0   Tiotropium Bromide-Olodaterol (STIOLTO RESPIMAT) 2.5-2.5 MCG/ACT AERS Inhale 2 puffs into the lungs daily. 4 g 0   [DISCONTINUED] fluticasone (FLONASE) 50 MCG/ACT nasal spray Place 1 spray into both nostrils daily.     [DISCONTINUED] lisinopril-hydrochlorothiazide (PRINZIDE,ZESTORETIC) 20-12.5 MG per tablet Take 1 tablet by mouth daily.     [DISCONTINUED] lovastatin (MEVACOR) 20 MG tablet Take 20 mg by mouth daily at 6 PM.     No current facility-administered medications on file prior to visit.    Allergies  Allergen Reactions   Penicillins Diarrhea    Social  History   Socioeconomic History   Marital status: Single    Spouse name: Not on file   Number of children: Not on file   Years of education: Not on file   Highest education level: Not on file  Occupational History   Not on file  Tobacco Use   Smoking status: Every Day    Packs/day: 0.50    Years: 48.00    Pack years: 24.00    Types: Cigarettes   Smokeless tobacco: Never   Tobacco comments:    3 times a week - 10/24/2020  Substance and Sexual Activity   Alcohol use: Yes    Comment: occ   Drug use: Yes    Types: Marijuana   Sexual activity: Not Currently  Other Topics Concern   Not on file  Social History Narrative   Not on file   Social Determinants of Health   Financial Resource Strain: Not on file  Food Insecurity: Not on file  Transportation Needs: Not on file  Physical Activity: Not on file  Stress: Not on file  Social Connections: Not on file  Intimate Partner Violence: Not on file    Family History  Problem Relation Age of Onset   Kidney failure Mother    Aneurysm Father     Past Surgical History:  Procedure Laterality Date   COLONOSCOPY WITH PROPOFOL N/A 06/07/2014   Procedure: COLONOSCOPY WITH PROPOFOL;  Surgeon: Arta Silence, MD;  Location: [facility];  Service: Endoscopy;  Laterality: N/A;   ESOPHAGOGASTRODUODENOSCOPY (EGD) WITH PROPOFOL N/A 06/07/2014   Procedure: ESOPHAGOGASTRODUODENOSCOPY (EGD) WITH PROPOFOL;  Surgeon: Arta Silence, MD;  Location: [facility];  Service: Endoscopy;  Laterality: N/A;    ROS: Review of Systems Negative except as stated above  PHYSICAL EXAM: There were no vitals taken for this visit.  Physical Exam  General appearance -elderly female in NAD Mental status - normal mood, behavior, speech, dress, motor activity, and thought processes Musculoskeletal -no tenderness on palpation of the lumbar spine or surrounding paraspinal muscles.  Straight leg raise produces pain in her knees.  Power in the lower extremities  5/5 bilaterally.  Gross sensation intact in the lower extremities bilaterally.  Gait appears unchanged. Results for orders placed or performed in visit on 05/28/21  POCT URINALYSIS DIP (CLINITEK)  Result Value Ref Range   Color, UA yellow yellow   Clarity, UA clear clear   Glucose, UA negative negative mg/dL   Bilirubin, UA negative negative   Ketones, POC UA negative negative mg/dL   Spec Grav, UA 1.015 1.010 - 1.025   Blood, UA negative negative   pH, UA 5.5 5.0 - 8.0   POC PROTEIN,UA negative negative, trace   Urobilinogen, UA 0.2 0.2 or 1.0 E.U./dL  Nitrite, UA Negative Negative   Leukocytes, UA Negative Negative     CMP Latest Ref Rng & Units 03/20/2021 02/09/2020 01/30/2020  Glucose 65 - 99 mg/dL 79 - 89  BUN 8 - 27 mg/dL 20 - 10  Creatinine 3.15 - 1.00 mg/dL 4.00(Q) - 6.76  Sodium 134 - 144 mmol/L 137 - 139  Potassium 3.5 - 5.2 mmol/L 5.6(H) 5.0 3.0(L)  Chloride 96 - 106 mmol/L 101 - 98  CO2 20 - 29 mmol/L 20 - 26  Calcium 8.7 - 10.3 mg/dL 11.6(H) - 9.8  Total Protein 6.0 - 8.5 g/dL 6.8 - -  Total Bilirubin 0.0 - 1.2 mg/dL 0.4 - -  Alkaline Phos 44 - 121 IU/L 114 - -  AST 0 - 40 IU/L 28 - -  ALT 0 - 32 IU/L 19 - -   Lipid Panel     Component Value Date/Time   CHOL 155 03/20/2021 1454   TRIG 258 (H) 03/20/2021 1454   HDL 82 03/20/2021 1454   CHOLHDL 1.9 03/20/2021 1454   CHOLHDL 1.5 01/15/2020 0258   VLDL 23 01/15/2020 0258   LDLCALC 35 03/20/2021 1454    CBC    Component Value Date/Time   WBC 5.3 03/20/2021 1454   WBC 4.8 01/17/2020 0456   RBC 3.18 (L) 03/20/2021 1454   RBC 2.51 (L) 01/17/2020 0456   HGB 10.4 (L) 03/20/2021 1454   HCT 31.0 (L) 03/20/2021 1454   PLT 316 03/20/2021 1454   MCV 98 (H) 03/20/2021 1454   MCH 32.7 03/20/2021 1454   MCH 31.5 01/17/2020 0456   MCHC 33.5 03/20/2021 1454   MCHC 33.1 01/17/2020 0456   RDW 13.1 03/20/2021 1454   LYMPHSABS 0.9 01/17/2020 0456   MONOABS 0.5 01/17/2020 0456   EOSABS 0.1 01/17/2020 0456   BASOSABS  0.0 01/17/2020 0456    ASSESSMENT AND PLAN:  1. Acute right-sided low back pain with sciatica, sciatica laterality unspecified Likely due to recent lifting and pushing. Recommend that she avoids any lifting, pushing or pulling over the next 2 to 3 weeks.  Recommend using a heating pad to the lower back.  Continue Tylenol and meloxicam.  I have given a limited supply of Robaxin to use as needed.  Advised that the medication can cause some drowsiness. Follow-up if no improvement or any worsening in symptoms. - methocarbamol (ROBAXIN) 500 MG tablet; Take 1 tablet (500 mg total) by mouth daily as needed for muscle spasms.  Dispense: 15 tablet; Refill: 0  2. Dysuria - POCT URINALYSIS DIP (CLINITEK)  3. Osteoarthritis of multiple joints, unspecified osteoarthritis type - meloxicam (MOBIC) 15 MG tablet; Take 1 tablet (15 mg total) by mouth daily.  Dispense: 30 tablet; Refill: 3   Patient was given the opportunity to ask questions.  Patient verbalized understanding of the plan and was able to repeat key elements of the plan.   No orders of the defined types were placed in this encounter.    Requested Prescriptions   Signed Prescriptions Disp Refills   Zoster Vaccine Adjuvanted [facility]) injection 0.5 mL 1    Sig: Inject 0.5 mLs into the muscle once for 1 dose.    No follow-ups on file.  Jonah Blue, MD, FACP

## 2021-06-20 ENCOUNTER — Other Ambulatory Visit: Payer: Self-pay | Admitting: Internal Medicine

## 2021-06-20 DIAGNOSIS — K219 Gastro-esophageal reflux disease without esophagitis: Secondary | ICD-10-CM

## 2021-06-21 NOTE — Telephone Encounter (Signed)
Requested Prescriptions  Pending Prescriptions Disp Refills   pantoprazole (PROTONIX) 40 MG tablet [Pharmacy Med Name: pantoprazole 40 mg tablet,delayed release] 90 tablet 0    Sig: TAKE ONE TABLET BY MOUTH EVERY DAY     Gastroenterology: Proton Pump Inhibitors Passed - 06/20/2021  3:26 PM      Passed - Valid encounter within last 12 months    Recent Outpatient Visits          3 weeks ago Acute right-sided low back pain with sciatica, sciatica laterality unspecified   [facility] Marcine Matar, MD   3 months ago Essential hypertension   [facility] Marcine Matar, MD   1 year ago Acute pain of right shoulder   [facility] [place], [place], [place]   1 year ago Need for influenza vaccination   [facility] Lois Huxley, Cornelius Moras, RPH-CPP   1 year ago Essential hypertension   [facility] Marcine Matar, MD      Future Appointments            In 4 days Marcine Matar, MD [facility]

## 2021-06-25 ENCOUNTER — Ambulatory Visit: Payer: Medicare Other | Attending: Internal Medicine | Admitting: Internal Medicine

## 2021-06-25 ENCOUNTER — Other Ambulatory Visit: Payer: Self-pay

## 2021-06-25 VITALS — BP 143/64 | HR 72 | Ht 63.0 in | Wt 116.0 lb

## 2021-06-25 DIAGNOSIS — F320 Major depressive disorder, single episode, mild: Secondary | ICD-10-CM

## 2021-06-25 DIAGNOSIS — D649 Anemia, unspecified: Secondary | ICD-10-CM | POA: Diagnosis not present

## 2021-06-25 DIAGNOSIS — M159 Polyosteoarthritis, unspecified: Secondary | ICD-10-CM

## 2021-06-25 DIAGNOSIS — I10 Essential (primary) hypertension: Secondary | ICD-10-CM | POA: Diagnosis not present

## 2021-06-25 DIAGNOSIS — J449 Chronic obstructive pulmonary disease, unspecified: Secondary | ICD-10-CM | POA: Diagnosis not present

## 2021-06-25 DIAGNOSIS — Z780 Asymptomatic menopausal state: Secondary | ICD-10-CM

## 2021-06-25 DIAGNOSIS — Z23 Encounter for immunization: Secondary | ICD-10-CM

## 2021-06-25 DIAGNOSIS — F1020 Alcohol dependence, uncomplicated: Secondary | ICD-10-CM

## 2021-06-25 DIAGNOSIS — F17200 Nicotine dependence, unspecified, uncomplicated: Secondary | ICD-10-CM

## 2021-06-25 DIAGNOSIS — Z1231 Encounter for screening mammogram for malignant neoplasm of breast: Secondary | ICD-10-CM

## 2021-06-25 DIAGNOSIS — F17210 Nicotine dependence, cigarettes, uncomplicated: Secondary | ICD-10-CM | POA: Diagnosis not present

## 2021-06-25 MED ORDER — BUPROPION HCL ER (SR) 100 MG PO TB12
ORAL_TABLET | ORAL | 1 refills | Status: DC
Start: 2021-06-25 — End: 2021-09-30

## 2021-06-25 MED ORDER — ZOSTER VAC RECOMB ADJUVANTED 50 MCG/0.5ML IM SUSR: 0.5000 mL | Freq: Once | INTRAMUSCULAR | 1 refills | Status: AC

## 2021-06-25 NOTE — Progress Notes (Signed)
Patient ID: Markell Schrier, female    DOB: Nov 15, 1955  MRN: [id number]  CC: Cough and Follow-up   Subjective: Shery Wauneka is a 65 y.o. female who presents for chronic ds management.  Ms. Bosie Clos  from [facility] is with her. Her concerns today include:  Patient with history of HTN, HL, tob dep, COPD, OA left knee, EtOH abuse, chronic diarrhea, SIADH    Pt is requesting PCS services.  Hse agrees to have Ms. Bosie Clos from [facility] with her.  Ms. Harland Dingwall tells me that she was in the patient's area several weeks ago and had inquired from her whether she needed assistance at home and patient indicated that she did.  She brings a form to be completed for [facility] to do an in-home assessment for PCS services. -Patient tells me that she lives alone and has arthritis in both knees left being worse than the right.  Some days she has difficulty getting around because of arthritis and weakness in the knees.  Last fall was in July of this year.  She feels she needs help with housecleaning, remind her to take her medications and putting on close.  She feels she can use some assistant at times with meal preps.  She bathes and feeds herself.  She gets on and off the toilet.  She has grab bars in her shower but does not have a shower chair or elevated toilet seat.  She feels she would benefit from both.  HTN:  Did not take  Norvasc and Irbesartin this AM. -No device to check blood pressure.  She tries to limit salt in the foods. I note that labs done on her visit in September revealed hypercalcemia.  Patient was to return to the lab to have additional studies done.  She is agreeable to getting the studies done today.  COPD:  taking Spiriva daily and Albuterol 1-2x/day. Not using neb every day -still smoking - 7 days/day.  Did not get the Buproprion as was discussed on her visit 03/2021.  Patient states the pharmacy did not have it.  She is agreeable for me to resend it today.    ETOH:  drinking two 40 oz beer a day.  She tells me this is less than previous.  She is taking a One-A-Day woman's vitamin.  She tells me she has stopped the folic acid and thiamine.  She has macrocytic anemia on CBC done in September.  Hemoglobin level had improved from July of last year.  I plan to get vitamin B12 and folic acid levels today.  She is up-to-date with colonoscopy.  Positive depression screen: She tells me that she gets depressed every so often because she lives alone.  She also gets depressed over her daughter and grandson both of home health mental illness.  She would like to get some counseling.  HM: Due for shingles vaccine.  She is due for mammogram and DEXA scan.  She is agreeable for me to place order for these. Patient Active Problem List   Diagnosis Date Noted   Allergic rhinitis 10/24/2020   Rotator cuff arthropathy of left shoulder 08/07/2020   Cervicalgia 08/07/2020   Chronic cough 03/21/2020   Alcohol use disorder, moderate, dependence (HCC) 01/30/2020   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   Tobacco dependence 01/30/2020   Diarrhea of infectious origin 01/30/2020   Hyponatremia 01/13/2020   Alcohol abuse 01/13/2020   Tobacco abuse 01/13/2020  Chronic diarrhea 01/13/2020   Hypophosphatemia 01/13/2020   Hypertension    Chronic obstructive pulmonary disease (HCC)    Alcohol use    Hypokalemia 01/12/2020   Tobacco use 10/06/2019   Primary osteoarthritis of left knee 09/08/2019   Pain in right shoulder 08/11/2019     Current Outpatient Medications on File Prior to Visit  Medication Sig Dispense Refill   albuterol (PROVENTIL) (2.5 MG/3ML) 0.083% nebulizer solution inhale THE contents of 1 vial PER nebulizer EVERY 6 HOURS AS NEEDED FOR wheezing OR SHORTNESS OF BREATH 150 mL 5   albuterol (VENTOLIN HFA) 108 (90 Base) MCG/ACT inhaler inhale 2 puffs into THE lungs EVERY 6 HOURS AS NEEDED FOR wheezing OR SHORTNESS OF BREATH 8.5 g 2    amLODipine (NORVASC) 10 MG tablet TAKE 1 TABLET(10 MG) BY MOUTH DAILY FOR BLOOD PRESSURE 90 tablet 2   atorvastatin (LIPITOR) 80 MG tablet TAKE 1 Tablet BY MOUTH ONCE DAILY 90 tablet 1   irbesartan (AVAPRO) 75 MG tablet TAKE 1 TABLET(75 MG) BY MOUTH DAILY 90 tablet 2   loratadine (CLARITIN) 10 MG tablet Take 1 tablet (10 mg total) by mouth daily. 90 tablet 2   meloxicam (MOBIC) 15 MG tablet Take 1 tablet (15 mg total) by mouth daily. 30 tablet 3   methocarbamol (ROBAXIN) 500 MG tablet Take 1 tablet (500 mg total) by mouth daily as needed for muscle spasms. 15 tablet 0   pantoprazole (PROTONIX) 40 MG tablet TAKE ONE TABLET BY MOUTH EVERY DAY 90 tablet 0   Tiotropium Bromide-Olodaterol (STIOLTO RESPIMAT) 2.5-2.5 MCG/ACT AERS Inhale 2 puffs into the lungs daily. 4 g 0   SPIRIVA HANDIHALER 18 MCG inhalation capsule place ONE CAPSULE into inhaler AND inhale daily 30 capsule 2   [DISCONTINUED] fluticasone (FLONASE) 50 MCG/ACT nasal spray Place 1 spray into both nostrils daily.     [DISCONTINUED] lisinopril-hydrochlorothiazide (PRINZIDE,ZESTORETIC) 20-12.5 MG per tablet Take 1 tablet by mouth daily.     [DISCONTINUED] lovastatin (MEVACOR) 20 MG tablet Take 20 mg by mouth daily at 6 PM.     No current facility-administered medications on file prior to visit.    Allergies  Allergen Reactions   Penicillins Diarrhea    Social History   Socioeconomic History   Marital status: Single    Spouse name: Not on file   Number of children: Not on file   Years of education: Not on file   Highest education level: Not on file  Occupational History   Not on file  Tobacco Use   Smoking status: Every Day    Packs/day: 0.50    Years: 48.00    Pack years: 24.00    Types: Cigarettes   Smokeless tobacco: Never   Tobacco comments:    3 times a week - 10/24/2020  Substance and Sexual Activity   Alcohol use: Yes    Comment: occ   Drug use: Yes    Types: Marijuana   Sexual activity: Not Currently   Other Topics Concern   Not on file  Social History Narrative   Not on file   Social Determinants of Health   Financial Resource Strain: Not on file  Food Insecurity: Not on file  Transportation Needs: Not on file  Physical Activity: Not on file  Stress: Not on file  Social Connections: Not on file  Intimate Partner Violence: Not on file    Family History  Problem Relation Age of Onset   Kidney failure Mother    Aneurysm Father  Past Surgical History:  Procedure Laterality Date   COLONOSCOPY WITH PROPOFOL N/A 06/07/2014   Procedure: COLONOSCOPY WITH PROPOFOL;  Surgeon: Willis Modena, MD;  Location: [facility];  Service: Endoscopy;  Laterality: N/A;   ESOPHAGOGASTRODUODENOSCOPY (EGD) WITH PROPOFOL N/A 06/07/2014   Procedure: ESOPHAGOGASTRODUODENOSCOPY (EGD) WITH PROPOFOL;  Surgeon: Willis Modena, MD;  Location: [facility];  Service: Endoscopy;  Laterality: N/A;    ROS: Review of Systems Negative except as stated above  PHYSICAL EXAM: BP (!) 143/64    Pulse 72    Ht 5\' 3"  (1.6 m)    Wt 116 lb (52.6 kg)    SpO2 100%    BMI 20.55 kg/m   Wt Readings from Last 3 Encounters:  06/25/21 116 lb (52.6 kg)  05/28/21 117 lb 9.6 oz (53.3 kg)  10/24/20 115 lb 9.6 oz (52.4 kg)    BP149/74 Physical Exam  General appearance - alert, elderly African-American female in NAD.   Mental status - normal mood, behavior, speech, dress, motor activity, and thought processes.  Patient a little forgetful at times. Mouth - mucous membranes moist, pharynx normal without lesions Neck - supple, no significant adenopathy Chest - clear to auscultation, no wheezes, rales or rhonchi, symmetric air entry Heart - normal rate, regular rhythm, normal S1, S2, no murmurs, rubs, clicks or gallops Extremities -no lower extremity edema. MSK: Left knee joint is moderately enlarged and bowed inward medially.  No point tenderness on palpation.  Moderate crepitus on passive range of motion. Right knee:  Mildly and large with no point tenderness.  Mild crepitus on passive range of motion.  Depression screen [facility] 2/9 06/25/2021 05/28/2021 04/13/2020  Decreased Interest 2 2 2   Down, Depressed, Hopeless 2 2 2   PHQ - 2 Score 4 4 4   Altered sleeping 2 3 1   Tired, decreased energy 2 1 3   Change in appetite 2 2 2   Feeling bad or failure about yourself  2 2 2   Trouble concentrating 2 2 2   Moving slowly or fidgety/restless 0 2 2  Suicidal thoughts 0 0 0  PHQ-9 Score 14 16 16   Difficult doing work/chores - - -    CMP Latest Ref Rng & Units 06/25/2021 03/20/2021 02/09/2020  Glucose 70 - 99 mg/dL 81 79 -  BUN 8 - 27 mg/dL 26 20 -  Creatinine - 1.00 mg/dL ) ) -  Sodium 134 - 144 mmol/L 133(L) 137 -  Potassium 3.5 - 5.2 mmol/L 5.6(H) 5.6(H) 5.0  Chloride 96 - 106 mmol/L 102 101 -  CO2 20 - 29 mmol/L 19(L) 20 -  Calcium 8.7 - 10.3 mg/dL 10.7(H) 11.6(H) -  Total Protein 6.0 - 8.5 g/dL 7.1 6.8 -  Total Bilirubin 0.0 - 1.2 mg/dL - 0.4 -  Alkaline Phos 44 - 121 IU/L - 114 -  AST 0 - 40 IU/L - 28 -  ALT 0 - 32 IU/L - 19 -   Lipid Panel     Component Value Date/Time   CHOL 155 03/20/2021 1454   TRIG 258 (H) 03/20/2021 1454   HDL 82 03/20/2021 1454   CHOLHDL 1.9 03/20/2021 1454   CHOLHDL 1.5 01/15/2020 0258   VLDL 23 01/15/2020 0258   LDLCALC 35 03/20/2021 1454    CBC    Component Value Date/Time   WBC 7.1 06/25/2021 1500   WBC 4.8 01/17/2020 0456   RBC 3.21 (L) 06/25/2021 1500   RBC 2.51 (L) 01/17/2020 0456   HGB 10.4 (L) 06/25/2021 1500   HCT 31.4 (  L) 06/25/2021 1500   PLT 312 06/25/2021 1500   MCV 98 (H) 06/25/2021 1500   MCH 32.4 06/25/2021 1500   MCH 31.5 01/17/2020 0456   MCHC 33.1 06/25/2021 1500   MCHC 33.1 01/17/2020 0456   RDW 13.0 06/25/2021 1500   LYMPHSABS 0.9 01/17/2020 0456   MONOABS 0.5 01/17/2020 0456   EOSABS 0.1 01/17/2020 0456   BASOSABS 0.0 01/17/2020 0456    ASSESSMENT AND PLAN: 1. Osteoarthritis of multiple joints, unspecified  osteoarthritis type Patient to continue meloxicam.  She can also take Tylenol once or twice a day. Scription given for her to get shower chair and elevated toilet seat. She could benefit from [facility] services.  I will have our caseworker submit request to [facility]. - For home use only DME Other see comment  2. Essential hypertension Not at goal.  She has not taken medicines as yet for today.  She will take those as soon as she returns home.  3. Chronic obstructive pulmonary disease, unspecified COPD type (HCC) Stable.  Continue Spiriva daily and albuterol as needed.  4. Tobacco dependence Continue to encourage her towards smoking cessation.  I will resend the prescription to the pharmacy for the bupropion. - buPROPion ER (WELLBUTRIN SR) 100 MG 12 hr tablet; 1 tab PO daily x 3 days then 1 tab PO BID. To help with smoking cessation  Dispense: 60 tablet; Refill: 1  5. Alcohol use disorder, moderate, dependence (HCC) Strongly advised her to quit or cut back even more.  Went over health risks associated with continued alcohol use.  Patient is agreeable to cutting back to no more than one 40 oz beer a day for now.  6. Mild major depression (HCC) Patient denies suicidal ideation.  She feels she would benefit from some counseling.  Referral submitted to behavioral health.  I told her that the Wellbutrin will also help with depression.  7. Hypercalcemia - Basic metabolic panel - VITAMIN D 25 Hydroxy (Vit-D Deficiency, Fractures) - TSH+T4F+T3Free - PTH, Intact and Calcium - PE and FLC, Serum  8. Normocytic anemia - CBC - Iron, TIBC and Ferritin Panel - Vitamin B12 - Folate  9. Need for shingles vaccine Prescription given for her to get her shingles vaccine at an outside pharmacy. - Zoster Vaccine Adjuvanted [facility]) injection; Inject 0.5 mLs into the muscle once for 1 dose.  Dispense: 0.5 mL; Refill: 1  10. Encounter for screening mammogram for malignant neoplasm of breast - MM DIGITAL  SCREENING BILATERAL; Future  11. Postmenopausal estrogen deficiency - DG Bone Density; Future   Patient was given the opportunity to ask questions.  Patient verbalized understanding of the plan and was able to repeat key elements of the plan.   Orders Placed This Encounter  Procedures   For home use only DME Other see comment   MM DIGITAL SCREENING BILATERAL   DG Bone Density   Tdap vaccine greater than or equal to 7yo IM   CBC   Basic metabolic panel   VITAMIN D 25 Hydroxy (Vit-D Deficiency, Fractures)   TSH+T4F+T3Free   PTH, Intact and Calcium   PE and FLC, Serum   Iron, TIBC and Ferritin Panel   Vitamin B12   Folate     Requested Prescriptions   Signed Prescriptions Disp Refills   Zoster Vaccine Adjuvanted (SHINGRIX) injection 0.5 mL 1    Sig: Inject 0.5 mLs into the muscle once for 1 dose.   buPROPion ER (WELLBUTRIN SR) 100 MG 12 hr tablet 60 tablet 1  Sig: 1 tab PO daily x 3 days then 1 tab PO BID. To help with smoking cessation    Return in about 4 months (around 10/24/2021).  Jonah Blue, MD, FACP

## 2021-06-25 NOTE — Progress Notes (Signed)
Complaining of muscle spasm.

## 2021-06-26 ENCOUNTER — Encounter: Payer: Self-pay | Admitting: Internal Medicine

## 2021-06-26 ENCOUNTER — Telehealth: Payer: Self-pay

## 2021-06-26 ENCOUNTER — Telehealth: Payer: Self-pay | Admitting: Internal Medicine

## 2021-06-26 NOTE — Telephone Encounter (Signed)
I attempted to reach patient today via phone to go over her lab results.  Mailbox is full on her cell phone and I was unable to leave a message.  I left a message on her home phone stating who I am and that I was calling to go over lab results.  I will try her again later.  Results for orders placed or performed in visit on 06/25/21  CBC  Result Value Ref Range   WBC 7.1 3.4 - 10.8 x10E3/uL   RBC 3.21 (L) 3.77 - 5.28 x10E6/uL   Hemoglobin 10.4 (L) 11.1 - 15.9 g/dL   Hematocrit 31.4 (L) 34.0 - 46.6 %   MCV 98 (H) 79 - 97 fL   MCH 32.4 26.6 - 33.0 pg   MCHC 33.1 31.5 - 35.7 g/dL   RDW 13.0 11.7 - 15.4 %   Platelets 312 150 - 450 [id number]  Basic metabolic panel  Result Value Ref Range   Glucose 81 70 - 99 mg/dL   BUN 26 8 - 27 mg/dL   Creatinine, Ser 1.54 (H) 0.57 - 1.00 mg/dL   eGFR 37 (L) >59 mL/min/1.73   BUN/Creatinine Ratio 17 12 - 28   Sodium 133 (L) 134 - 144 mmol/L   Potassium 5.6 (H) 3.5 - 5.2 mmol/L   Chloride 102 96 - 106 mmol/L   CO2 19 (L) 20 - 29 mmol/L   Calcium 10.7 (H) 8.7 - 10.3 mg/dL  VITAMIN D 25 Hydroxy (Vit-D Deficiency, Fractures)  Result Value Ref Range   Vit D, 25-Hydroxy 36.5 30.0 - 100.0 ng/mL  TSH+T4F+T3Free  Result Value Ref Range   TSH 0.416 (L) 0.450 - 4.500 uIU/mL   T3, Free 2.1 2.0 - 4.4 pg/mL   Free T4 1.05 0.82 - 1.77 ng/dL  PTH, Intact and Calcium  Result Value Ref Range   PTH WILL FOLLOW    PTH Interp Comment   PE and FLC, Serum  Result Value Ref Range   Total Protein 7.1 6.0 - 8.5 g/dL   Albumin ELP WILL FOLLOW    Alpha 1 WILL FOLLOW    Alpha 2 WILL FOLLOW    Beta WILL FOLLOW    Gamma Globulin WILL FOLLOW    M-Spike, % WILL FOLLOW    Globulin, Total WILL FOLLOW    A/G Ratio WILL FOLLOW    Please Note: Comment    Ig Kappa Free Light Chain WILL FOLLOW    Ig Lambda Free Light Chain WILL FOLLOW    KAPPA/LAMBDA RATIO WILL FOLLOW   Iron, TIBC and Ferritin Panel  Result Value Ref Range   Total Iron Binding Capacity 298 250 - 450 ug/dL    UIBC 185 118 - 369 ug/dL   Iron 113 27 - 139 ug/dL   Iron Saturation 38 15 - 55 %   Ferritin 239 (H) 15 - 150 ng/mL

## 2021-06-26 NOTE — Telephone Encounter (Signed)
Attempted to contact patient to discuss her request for PCS.  Call placed to # [phone], voicemail is full.  Called # [phone], wrong number per individual who answered. Number removed from [facility] # [phone], phone just rings. No option to leave a message.

## 2021-06-27 ENCOUNTER — Telehealth: Payer: Self-pay | Admitting: Internal Medicine

## 2021-06-27 DIAGNOSIS — E875 Hyperkalemia: Secondary | ICD-10-CM

## 2021-06-27 NOTE — Telephone Encounter (Signed)
Phone call placed to patient today to go over lab results.  Mobile voicemail box is full and I am unable to leave a message.  Phone rings without answer for the work phone number listed.  There is an alternate phone number for relative named Loyal Buba.  There is no answer on that phone number either.  I will send patient a letter.

## 2021-06-27 NOTE — Telephone Encounter (Signed)
Attempted again to contact patient to discuss her request for PCS.   Call placed to # [phone], voicemail is full.   Called # [phone], phone just rings. No option to leave a message.

## 2021-06-27 NOTE — Telephone Encounter (Signed)
Letter has been mailed.

## 2021-06-29 LAB — PE AND FLC, SERUM
A/G Ratio: 2 — ABNORMAL HIGH (ref 0.7–1.7)
Albumin ELP: 4.7 g/dL — ABNORMAL HIGH (ref 2.9–4.4)
Alpha 1: 0.2 g/dL (ref 0.0–0.4)
Alpha 2: 0.8 g/dL (ref 0.4–1.0)
Beta: 0.9 g/dL (ref 0.7–1.3)
Gamma Globulin: 0.5 g/dL (ref 0.4–1.8)
Globulin, Total: 2.4 g/dL (ref 2.2–3.9)
Ig Kappa Free Light Chain: 20.2 mg/L — ABNORMAL HIGH (ref 3.3–19.4)
Ig Lambda Free Light Chain: 16.3 mg/L (ref 5.7–26.3)
KAPPA/LAMBDA RATIO: 1.24 (ref 0.26–1.65)
Total Protein: 7.1 g/dL (ref 6.0–8.5)

## 2021-06-29 LAB — BASIC METABOLIC PANEL
BUN/Creatinine Ratio: 17 (ref 12–28)
BUN: 26 mg/dL (ref 8–27)
CO2: 19 mmol/L — ABNORMAL LOW (ref 20–29)
Calcium: 10.7 mg/dL — ABNORMAL HIGH (ref 8.7–10.3)
Chloride: 102 mmol/L (ref 96–106)
Creatinine, Ser: 1.54 mg/dL — ABNORMAL HIGH (ref 0.57–1.00)
Glucose: 81 mg/dL (ref 70–99)
Potassium: 5.6 mmol/L — ABNORMAL HIGH (ref 3.5–5.2)
Sodium: 133 mmol/L — ABNORMAL LOW (ref 134–144)
eGFR: 37 mL/min/{1.73_m2} — ABNORMAL LOW (ref >59)

## 2021-06-29 LAB — VITAMIN B12: Vitamin B-12: 709 pg/mL (ref 232–1245)

## 2021-06-29 LAB — FOLATE: Folate: >20 ng/mL (ref >3.0)

## 2021-06-29 LAB — SPECIMEN STATUS REPORT

## 2021-06-29 LAB — PTH, INTACT AND CALCIUM: PTH: 64 pg/mL (ref 15–65)

## 2021-06-29 LAB — IRON,TIBC AND FERRITIN PANEL
Ferritin: 239 ng/mL — ABNORMAL HIGH (ref 15–150)
Iron Saturation: 38 % (ref 15–55)
Iron: 113 ug/dL (ref 27–139)
Total Iron Binding Capacity: 298 ug/dL (ref 250–450)
UIBC: 185 ug/dL (ref 118–369)

## 2021-06-29 LAB — CBC
Hematocrit: 31.4 % — ABNORMAL LOW (ref 34.0–46.6)
Hemoglobin: 10.4 g/dL — ABNORMAL LOW (ref 11.1–15.9)
MCH: 32.4 pg (ref 26.6–33.0)
MCHC: 33.1 g/dL (ref 31.5–35.7)
MCV: 98 fL — ABNORMAL HIGH (ref 79–97)
Platelets: 312 10*3/uL (ref 150–450)
RBC: 3.21 x10E6/uL — ABNORMAL LOW (ref 3.77–5.28)
RDW: 13 % (ref 11.7–15.4)
WBC: 7.1 10*3/uL (ref 3.4–10.8)

## 2021-06-29 LAB — TSH+T4F+T3FREE
Free T4: 1.05 ng/dL (ref 0.82–1.77)
T3, Free: 2.1 pg/mL (ref 2.0–4.4)
TSH: 0.416 u[IU]/mL — ABNORMAL LOW (ref 0.450–4.500)

## 2021-06-29 LAB — VITAMIN D 25 HYDROXY (VIT D DEFICIENCY, FRACTURES): Vit D, 25-Hydroxy: 36.5 ng/mL (ref 30.0–100.0)

## 2021-07-03 NOTE — Telephone Encounter (Signed)
Attempted again to contact patient to discuss her request for PCS.  ° °Call placed to # [phone], voicemail is full.  ° °Called # [phone], phone just rings. No option to leave a message.  °

## 2021-07-05 NOTE — Telephone Encounter (Signed)
Pt called back after receiving the letter.  She has updated her phone number.  Please call [phone]

## 2021-07-08 NOTE — Telephone Encounter (Signed)
PC placed to the phone number listed in this message [phone].  Got a recorded VM with a female's voice.  I did not leave a message.

## 2021-07-10 ENCOUNTER — Other Ambulatory Visit: Payer: Self-pay | Admitting: Emergency Medicine

## 2021-07-10 MED ORDER — DICLOFENAC SODIUM 1 % EX GEL: 2.0000 g | Freq: Four times a day (QID) | CUTANEOUS | 1 refills | Status: DC

## 2021-07-10 NOTE — Telephone Encounter (Signed)
Letter has been mailed.

## 2021-07-10 NOTE — Telephone Encounter (Signed)
Phone call placed to patient again today with the number that was provided.  Again I got a voicemail with the female's voice.  I did not leave a message.  I will send patient a letter with instructions of what needs to be done based on lab results.  Results for orders placed or performed in visit on 06/25/21  CBC  Result Value Ref Range   WBC 7.1 3.4 - 10.8 x10E3/uL   RBC 3.21 (L) 3.77 - 5.28 x10E6/uL   Hemoglobin 10.4 (L) 11.1 - 15.9 g/dL   Hematocrit 31.4 (L) 34.0 - 46.6 %   MCV 98 (H) 79 - 97 fL   MCH 32.4 26.6 - 33.0 pg   MCHC 33.1 31.5 - 35.7 g/dL   RDW 13.0 11.7 - 15.4 %   Platelets 312 150 - 450 M38T7/RN  Basic metabolic panel  Result Value Ref Range   Glucose 81 70 - 99 mg/dL   BUN 26 8 - 27 mg/dL   Creatinine, Ser 1.54 (H) 0.57 - 1.00 mg/dL   eGFR 37 (L) >59 mL/min/1.73   BUN/Creatinine Ratio 17 12 - 28   Sodium 133 (L) 134 - 144 mmol/L   Potassium 5.6 (H) 3.5 - 5.2 mmol/L   Chloride 102 96 - 106 mmol/L   CO2 19 (L) 20 - 29 mmol/L   Calcium 10.7 (H) 8.7 - 10.3 mg/dL  VITAMIN D 25 Hydroxy (Vit-D Deficiency, Fractures)  Result Value Ref Range   Vit D, 25-Hydroxy 36.5 30.0 - 100.0 ng/mL  TSH+T4F+T3Free  Result Value Ref Range   TSH 0.416 (L) 0.450 - 4.500 uIU/mL   T3, Free 2.1 2.0 - 4.4 pg/mL   Free T4 1.05 0.82 - 1.77 ng/dL  PTH, Intact and Calcium  Result Value Ref Range   PTH 64 15 - 65 pg/mL   PTH Interp Comment   PE and FLC, Serum  Result Value Ref Range   Total Protein 7.1 6.0 - 8.5 g/dL   Albumin ELP 4.7 (H) 2.9 - 4.4 g/dL   Alpha 1 0.2 0.0 - 0.4 g/dL   Alpha 2 0.8 0.4 - 1.0 g/dL   Beta 0.9 0.7 - 1.3 g/dL   Gamma Globulin 0.5 0.4 - 1.8 g/dL   M-Spike, % Not Observed Not Observed g/dL   Globulin, Total 2.4 2.2 - 3.9 g/dL   A/G Ratio 2.0 (H) 0.7 - 1.7   Please Note: Comment    Ig Kappa Free Light Chain 20.2 (H) 3.3 - 19.4 mg/L   Ig Lambda Free Light Chain 16.3 5.7 - 26.3 mg/L   KAPPA/LAMBDA RATIO 1.24 0.26 - 1.65  Iron, TIBC and Ferritin Panel  Result  Value Ref Range   Total Iron Binding Capacity 298 250 - 450 ug/dL   UIBC 185 118 - 369 ug/dL   Iron 113 27 - 139 ug/dL   Iron Saturation 38 15 - 55 %   Ferritin 239 (H) 15 - 150 ng/mL  Vitamin B12  Result Value Ref Range   Vitamin B-12 709 232 - 1,245 pg/mL  Folate  Result Value Ref Range   Folate >20.0 >3.0 ng/mL  Specimen status report  Result Value Ref Range   specimen status report Comment

## 2021-07-10 NOTE — Addendum Note (Signed)
Addended by: Jonah Blue B on: 07/10/2021 11:50 AM   Modules accepted: Orders

## 2021-07-15 ENCOUNTER — Telehealth: Payer: Self-pay

## 2021-07-15 NOTE — Telephone Encounter (Signed)
Attempted again to contact patient to discuss her request for PCS.    Call placed to # [phone], message left requesting patient return call to this CM.     Called # [phone], phone just rings. No option to leave a message.

## 2021-07-16 ENCOUNTER — Other Ambulatory Visit: Payer: Self-pay | Admitting: Internal Medicine

## 2021-07-16 ENCOUNTER — Other Ambulatory Visit: Payer: Self-pay | Admitting: Emergency Medicine

## 2021-07-16 DIAGNOSIS — E785 Hyperlipidemia, unspecified: Secondary | ICD-10-CM

## 2021-07-16 NOTE — Telephone Encounter (Signed)
Requested Prescriptions  Pending Prescriptions Disp Refills   atorvastatin (LIPITOR) 80 MG tablet [Pharmacy Med Name: atorvastatin 80 mg tablet] 90 tablet 1    Sig: TAKE 1 Tablet BY MOUTH ONCE DAILY     Cardiovascular:  Antilipid - Statins Failed - 07/16/2021  1:32 PM      Failed - Triglycerides in normal range and within 360 days    Triglycerides  Date Value Ref Range Status  03/20/2021 258 (H) 0 - 149 mg/dL Final         Passed - Total Cholesterol in normal range and within 360 days    Cholesterol, Total  Date Value Ref Range Status  03/20/2021 155 100 - 199 mg/dL Final         Passed - LDL in normal range and within 360 days    LDL Chol Calc (NIH)  Date Value Ref Range Status  03/20/2021 35 0 - 99 mg/dL Final         Passed - HDL in normal range and within 360 days    HDL  Date Value Ref Range Status  03/20/2021 82 >39 mg/dL Final         Passed - Patient is not pregnant      Passed - Valid encounter within last 12 months    Recent Outpatient Visits          3 weeks ago Osteoarthritis of multiple joints, unspecified osteoarthritis type   [facility] [place], Gavin Pound B, MD   1 month ago Acute right-sided low back pain with sciatica, sciatica laterality unspecified   [place] [facility] Marcine Matar, MD   3 months ago Essential hypertension   [facility] Marcine Matar, MD   1 year ago Acute pain of right shoulder   [facility] [place], [place], [place]   1 year ago Need for influenza vaccination   [facility] Drucilla Chalet, RPH-CPP

## 2021-07-19 ENCOUNTER — Encounter (HOSPITAL_COMMUNITY): Payer: Self-pay | Admitting: Emergency Medicine

## 2021-07-19 ENCOUNTER — Ambulatory Visit (HOSPITAL_COMMUNITY)
Admission: EM | Admit: 2021-07-19 | Discharge: 2021-07-19 | Disposition: A | Discharge status: Home / Self Care | Payer: Medicare Other | Attending: Family Medicine | Admitting: Family Medicine

## 2021-07-19 ENCOUNTER — Other Ambulatory Visit: Payer: Self-pay

## 2021-07-19 DIAGNOSIS — G8929 Other chronic pain: Secondary | ICD-10-CM

## 2021-07-19 DIAGNOSIS — M5442 Lumbago with sciatica, left side: Secondary | ICD-10-CM

## 2021-07-19 DIAGNOSIS — M5441 Lumbago with sciatica, right side: Secondary | ICD-10-CM | POA: Diagnosis not present

## 2021-07-19 LAB — POCT URINALYSIS DIPSTICK, ED / UC
Glucose, UA: NEGATIVE mg/dL
Leukocytes,Ua: NEGATIVE
Nitrite: NEGATIVE
Protein, ur: NEGATIVE mg/dL
Specific Gravity, Urine: 1.02 (ref 1.005–1.030)
Urobilinogen, UA: 0.2 mg/dL (ref 0.0–1.0)
pH: 5 (ref 5.0–8.0)

## 2021-07-19 MED ORDER — METHOCARBAMOL 500 MG PO TABS: 500.0000 mg | ORAL_TABLET | Freq: Three times a day (TID) | ORAL | 0 refills | Status: DC | PRN

## 2021-07-19 MED ORDER — PREDNISONE 20 MG PO TABS: 20.0000 mg | ORAL_TABLET | Freq: Every day | ORAL | 0 refills | Status: AC

## 2021-07-19 MED ORDER — DEXAMETHASONE SODIUM PHOSPHATE 10 MG/ML IJ SOLN
INTRAMUSCULAR | Status: AC
  Filled 2021-07-19: qty 1

## 2021-07-19 MED ORDER — DEXAMETHASONE SODIUM PHOSPHATE 10 MG/ML IJ SOLN
10.0000 mg | Freq: Once | INTRAMUSCULAR | Status: AC
  Administered 2021-07-19: 10 mg via INTRAMUSCULAR

## 2021-07-19 NOTE — ED Provider Notes (Signed)
[place]    CSN: [id number] Arrival date & time: 07/19/21  1543      History   Chief Complaint Chief Complaint  Patient presents with   Back Pain    HPI Anna Mcguire is a 66 y.o. female.   HPI Patient presents today with back pain x3 days.  Patient has history of chronic back pain and was recently advised to stop taking meloxicam as her kidney functioning has declined.  She denies any known injury and is seeking treatment of acute on chronic back pain.  She reports no definitive urinary symptoms however endorses she has been urinating more frequently however this been ongoing for over a month.  She denies any hematuria.  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   Tobacco dependence 01/30/2020   Diarrhea of infectious origin 01/30/2020   Hyponatremia 01/13/2020   Alcohol abuse 01/13/2020   Tobacco abuse 01/13/2020   Chronic diarrhea 01/13/2020   Hypophosphatemia 01/13/2020   Hypertension    Chronic obstructive pulmonary disease ([place])    Alcohol use    Hypokalemia 01/12/2020   Tobacco use 10/06/2019   Primary osteoarthritis of left knee 09/08/2019   Pain in right shoulder 08/11/2019    Past Surgical History:  Procedure Laterality Date   COLONOSCOPY WITH PROPOFOL N/A 06/07/2014   Procedure: COLONOSCOPY WITH PROPOFOL;  Surgeon: Arta Silence, MD;  Location: [facility] END[facility]COPY;  Service: Endoscopy;  Laterality: N/A;   ESOPHAGOGASTRODUODENOSCOPY (EGD) WITH PROPOFOL N/A 06/07/2014   Procedure: ESOPHAGOGASTRODUODENOSCOPY (EGD) WITH PROPOFOL;  Surgeon: Arta Silence, MD;  Location: [facility]  ENDOSCOPY;  Service: Endoscopy;  Laterality: N/A;    OB History   No obstetric history on file.      Home Medications    Prior to Admission medications   Medication Sig Start Date End Date Taking? Authorizing Provider  methocarbamol (ROBAXIN) 500 MG tablet Take 1 tablet (500 mg total) by mouth every 8 (eight) hours as needed for muscle spasms. 07/19/21  Yes Scot Jun, FNP  predniSONE (DELTASONE) 20 MG tablet Take 1 tablet (20 mg total) by mouth daily with breakfast for 5 days. 07/19/21 07/24/21 Yes Scot Jun, FNP  albuterol (PROVENTIL) (2.5 MG/3ML) 0.083% nebulizer solution inhale THE contents of 1 vial PER nebulizer EVERY 6 HOURS AS NEEDED FOR wheezing OR SHORTNESS OF BREATH 05/07/21   Ladell Pier, MD  albuterol (VENTOLIN HFA) 108 (90 Base) MCG/ACT inhaler inhale 2 puffs into THE lungs EVERY 6 HOURS AS NEEDED FOR wheezing OR SHORTNESS OF BREATH 07/16/21   Martyn Ehrich, NP  amLODipine (NORVASC) 10 MG tablet TAKE 1 TABLET(10 MG) BY MOUTH DAILY FOR BLOOD PRESSURE 03/19/21   Ladell Pier, MD  atorvastatin (LIPITOR) 80 MG tablet TAKE 1 Tablet BY MOUTH ONCE DAILY 07/16/21   Ladell Pier, MD  buPROPion ER [facility] SR) 100 MG 12 hr tablet 1 tab PO daily x 3 days then 1 tab PO BID. To help with smoking cessation 06/25/21   Ladell Pier, MD  diclofenac Sodium (VOLTAREN) 1 % GEL Apply 2 g topically 4 (four) times daily. To rub on knees  for arthritis pain.  STOP MELOXICAM 07/10/21   Ladell Pier, MD  irbesartan (AVAPRO) 75 MG tablet TAKE 1 TABLET(75 MG) BY MOUTH DAILY 03/19/21   Ladell Pier, MD  loratadine (CLARITIN) 10 MG tablet Take 1 tablet (10 mg total) by mouth daily. 03/19/21   Ladell Pier, MD  pantoprazole (PROTONIX) 40 MG tablet TAKE ONE TABLET BY MOUTH EVERY DAY 06/21/21   Ladell Pier, MD  SPIRIVA HANDIHALER 18 MCG inhalation capsule place ONE CAPSULE into inhaler AND inhale daily 05/08/21   Collene Gobble, MD  Tiotropium  Bromide-Olodaterol (STIOLTO RESPIMAT) 2.5-2.5 MCG/ACT AERS Inhale 2 puffs into the lungs daily. 10/24/20   Martyn Ehrich, NP  fluticasone (FLONASE) 50 MCG/ACT nasal spray Place 1 spray into both nostrils daily.  05/12/19  [provider]  lisinopril-hydrochlorothiazide (PRINZIDE,ZESTORETIC) 20-12.5 MG per tablet Take 1 tablet by mouth daily.  05/12/19  [provider]  lovastatin (MEVACOR) 20 MG tablet Take 20 mg by mouth daily at 6 PM.  05/12/19  [provider]    Family History Family History  Problem Relation Age of Onset   Kidney failure Mother    Aneurysm Father     Social History Social History   Tobacco Use   Smoking status: Every Day    Packs/day: 0.50    Years: 48.00    Pack years: 24.00    Types: Cigarettes   Smokeless tobacco: Never   Tobacco comments:    3 times a week - 10/24/2020  Substance Use Topics   Alcohol use: Yes    Comment: occ   Drug use: Yes    Types: Marijuana     Allergies   Penicillins   Review of Systems Review of Systems Pertinent negatives listed in HPI  Physical Exam Triage Vital Signs ED Triage Vitals  Enc Vitals Group     BP 07/19/21 1638 134/73     Pulse Rate 07/19/21 1638 84     Resp 07/19/21 1638 18     Temp 07/19/21 1638 99.1 F (37.3 C)     Temp Source 07/19/21 1638 Oral     SpO2 07/19/21 1638 99 %     Weight --      Height --      Head Circumference --      Peak Flow --      Pain Score 07/19/21 1636 9     Pain Loc --      Pain Edu? --      Excl. in [place]? --    No data found.  Updated Vital Signs BP 134/73 (BP Location: Right Arm)    Pulse 84    Temp 99.1 F (37.3 C) (Oral)    Resp 18    SpO2 99%   Visual Acuity Right Eye Distance:   Left Eye Distance:   Bilateral Distance:    Right Eye Near:   Left Eye Near:    Bilateral Near:     Physical Exam Constitutional:      Appearance: Normal appearance.  Cardiovascular:     Rate and Rhythm: Normal rate and regular rhythm.   Pulmonary:     Effort: Pulmonary effort is normal.     Breath sounds: Normal breath sounds and air entry.  Musculoskeletal:     Lumbar back: Tenderness and bony tenderness present. Positive right straight leg raise test and positive left straight leg raise test.  Neurological:     Mental Status: She is alert.  Comments: Antalgic gait  Psychiatric:        Attention and Perception: Attention normal.        Mood and Affect: Mood normal.        Speech: Speech normal.     UC Treatments / Results  Labs (all labs ordered are listed, but only abnormal results are displayed) Labs Reviewed  POCT URINALYSIS DIPSTICK, ED / UC - Abnormal; Notable for the following components:      Result Value   Bilirubin Urine SMALL (*)    Ketones, ur TRACE (*)    Hgb urine dipstick TRACE (*)    All other components within normal limits    EKG   Radiology No results found.  Procedures Procedures (including critical care time)  Medications Ordered in UC Medications  dexamethasone (DECADRON) injection 10 mg (10 mg Intramuscular Given 07/19/21 1716)    Initial Impression / Assessment and Plan / UC Course  I have reviewed the triage vital signs and the nursing notes.  Pertinent labs & imaging results that were available during my care of the patient were reviewed by me and considered in my medical decision making (see chart for details).    Chronic bilateral low back pain with sciatica Decadron 10 IM given here in clinic. Start oral prednisone 20 mg once daily for 5 days tomorrow. Methocarbamol 500 mg, PRN 3 times daily as needed follow-up with PCP as needed.  Return as needed  Final Clinical Impressions(s) / UC Diagnoses   Final diagnoses:  Chronic bilateral low back pain with bilateral sciatica     Discharge Instructions      Start oral prednisone 20 mg tomorrow take 1 pill with breakfast daily for 5 days to decrease inflammation which is causing her back pain.  For acute pain  methocarbamol 3 times daily as needed for back pain.  Caution as medication can cause severe drowsiness and dizziness Recommend applications of heat to help with pain management. Keep follow-up with primary care provider.  Avoid any medication that can affect your kidneys such as ibuprofen or naproxen.     ED Prescriptions     Medication Sig Dispense Auth. Provider   predniSONE (DELTASONE) 20 MG tablet Take 1 tablet (20 mg total) by mouth daily with breakfast for 5 days. 5 tablet Scot Jun, FNP   methocarbamol (ROBAXIN) 500 MG tablet Take 1 tablet (500 mg total) by mouth every 8 (eight) hours as needed for muscle spasms. 30 tablet Scot Jun, FNP      PDMP not reviewed this encounter.   Scot Jun, FNP 07/19/21 1754

## 2021-07-19 NOTE — ED Triage Notes (Signed)
Pt c/o back pain for a few days.  Pt has a note from PCP that states that her kidney count worsened and needing to stop Meloxicam and start Voltaren. Pt hasnt picked up Voltaren from pharmacy but reports has stopped meloxicam. Reports wants [place] to help with pain and what her letter states.  Pt reports that she has been urinating more frequently for about a month. Has some dysuria but not everyday. Denies blood in urine.

## 2021-07-19 NOTE — Discharge Instructions (Addendum)
Start oral prednisone 20 mg tomorrow take 1 pill with breakfast daily for 5 days to decrease inflammation which is causing her back pain.  For acute pain methocarbamol 3 times daily as needed for back pain.  Caution as medication can cause severe drowsiness and dizziness Recommend applications of heat to help with pain management. Keep follow-up with primary care provider.  Avoid any medication that can affect your kidneys such as ibuprofen or naproxen.

## 2021-08-15 ENCOUNTER — Other Ambulatory Visit: Payer: Self-pay | Admitting: Internal Medicine

## 2021-08-15 NOTE — Telephone Encounter (Signed)
Requested medication (s) are due for refill today: no ° °Requested medication (s) are on the active medication list: yes ° °Last refill:  07/10/21 ° °Future visit scheduled: no ° °Notes to clinic:  Unable to refill per protocol due to failed labs, no updated results. ° ° ° °  °Requested Prescriptions  °Pending Prescriptions Disp Refills  ° diclofenac Sodium (VOLTAREN) 1 % GEL [Pharmacy Med Name: diclofenac 1 % topical gel] 100 g 1  °  Sig: apply 2 grams APPLY TO THE AFFECTED AREA(S) FOUR TIMES DAILY (BOTH knees FOR ARTHRITIS pain) STOP meloxicam tablets.  °  ° Analgesics:  Topicals Failed - 08/15/2021 10:30 AM  °  °  Failed - Manual Review: Labs are only required if the patient has taken medication for more than 8 weeks.  °  °  Failed - HGB in normal range and within 360 days  °  Hemoglobin  °Date Value Ref Range Status  °06/25/2021 10.4 (L) 11.1 - 15.9 g/dL Final  °  °  °  °  Failed - HCT in normal range and within 360 days  °  Hematocrit  °Date Value Ref Range Status  °06/25/2021 31.4 (L) 34.0 - 46.6 % Final  °  °  °  °  Failed - Cr in normal range and within 360 days  °  Creatinine, Ser  °Date Value Ref Range Status  °06/25/2021 1.54 (H) 0.57 - 1.00 mg/dL Final  °  °  °  °  Passed - PLT in normal range and within 360 days  °  Platelets  °Date Value Ref Range Status  °06/25/2021 312 150 - 450 x10E3/uL Final  °  °  °  °  Passed - eGFR is 30 or above and within 360 days  °  GFR calc Af Amer  °Date Value Ref Range Status  °01/30/2020 75 >59 mL/min/1.73 Final  °  Comment:  °  **[facility] currently reports eGFR in compliance with the current** °  recommendations of the National Kidney Foundation. [facility] will °  update reporting as new guidelines are published from the NKF-ASN °  Task force. °  ° °GFR calc non Af Amer  °Date Value Ref Range Status  °01/30/2020 65 >59 mL/min/1.73 Final  ° °eGFR  °Date Value Ref Range Status  °06/25/2021 37 (L) >59 mL/min/1.73 Final  °  °  °  °  Passed - Patient is not pregnant  °  °  Passed -  Valid encounter within last 12 months  °  Recent Outpatient Visits   ° °      ° 1 month ago Osteoarthritis of multiple joints, unspecified osteoarthritis type  ° [facility] Johnson, Deborah B, MD  ° 2 months ago Acute right-sided low back pain with sciatica, sciatica laterality unspecified  ° [facility] Johnson, Deborah B, MD  ° 4 months ago Essential hypertension  ° [facility] Johnson, Deborah B, MD  ° 1 year ago Acute pain of right shoulder  ° [facility] [facility] McClung, Angela M, PA-C  ° 1 year ago Need for influenza vaccination  °  [facility] [place], Stephen L, RPH-CPP  ° °  °  ° °  °  °  ° °

## 2021-09-04 ENCOUNTER — Other Ambulatory Visit: Payer: Self-pay | Admitting: Internal Medicine

## 2021-09-04 DIAGNOSIS — K219 Gastro-esophageal reflux disease without esophagitis: Secondary | ICD-10-CM

## 2021-09-04 NOTE — Telephone Encounter (Signed)
Requested Prescriptions  ?Pending Prescriptions Disp Refills  ?? pantoprazole (PROTONIX) 40 MG tablet [Pharmacy Med Name: pantoprazole 40 mg tablet,delayed release] 90 tablet 0  ?  Sig: TAKE ONE TABLET BY MOUTH EVERY DAY  ?  ? Gastroenterology: Proton Pump Inhibitors Passed - 09/04/2021  3:18 PM  ?  ?  Passed - Valid encounter within last 12 months  ?  Recent Outpatient Visits   ?      ? 2 months ago Osteoarthritis of multiple joints, unspecified osteoarthritis type  ? [facility] Jonah Blue B, MD  ? 3 months ago Acute right-sided low back pain with sciatica, sciatica laterality unspecified  ? [facility] Marcine Matar, MD  ? 5 months ago Essential hypertension  ? [facility] Marcine Matar, MD  ? 1 year ago Acute pain of right shoulder  ? [facility] [place], [place], [place]  ? 1 year ago Need for influenza vaccination  ? [facility] Lois Huxley, Cornelius Moras, RPH-CPP  ?  ?  ? ?  ?  ?  ? ?

## 2021-09-30 ENCOUNTER — Telehealth: Payer: Self-pay | Admitting: Internal Medicine

## 2021-09-30 DIAGNOSIS — F17200 Nicotine dependence, unspecified, uncomplicated: Secondary | ICD-10-CM

## 2021-09-30 MED ORDER — BUPROPION HCL ER (SR) 100 MG PO TB12: 100.0000 mg | ORAL_TABLET | Freq: Two times a day (BID) | ORAL | 1 refills | Status: DC

## 2021-09-30 NOTE — Telephone Encounter (Signed)
Medication Refill - Medication: Ibuprofen 800 MG  ? ?Has the patient contacted their pharmacy? Yes.   ?(Agent: If no, request that the patient contact the pharmacy for the refill. If patient does not wish to contact the pharmacy document the reason why and proceed with request.) ?(Agent: If yes, when and what did the pharmacy advise?) ? ?Preferred Pharmacy (with phone number or street name):  ?[facility] - [place], [place] - [address]  ?[address] [place] [address]  ?Phone: [phone] Fax: [phone]  ? ?Has the patient been seen for an appointment in the last year OR does the patient have an upcoming appointment? Yes.   ? ?Agent: Please be advised that RX refills may take up to 3 business days. We ask that you follow-up with your pharmacy. ? ?

## 2021-09-30 NOTE — Telephone Encounter (Signed)
Rx sent. Attempted to call pt to inform her, however, she was unavailable. No VM setup.  ?

## 2021-09-30 NOTE — Telephone Encounter (Signed)
Patient called in to inform Dr Laural Benes that she is completely out of her buPROPion ER (WELLBUTRIN SR) 100 MG 12 hr tablet and would like a refill today please advise  ?

## 2021-10-02 ENCOUNTER — Other Ambulatory Visit: Payer: Self-pay | Admitting: Primary Care

## 2021-10-10 ENCOUNTER — Other Ambulatory Visit: Payer: Self-pay | Admitting: Internal Medicine

## 2021-10-10 DIAGNOSIS — I10 Essential (primary) hypertension: Secondary | ICD-10-CM

## 2021-10-22 ENCOUNTER — Telehealth: Payer: Self-pay

## 2021-10-22 NOTE — Telephone Encounter (Signed)
Contacted pt to schedule Medicare Wellness pt didn't answer and was unable to lvm  ?

## 2021-11-05 ENCOUNTER — Other Ambulatory Visit: Payer: Self-pay | Admitting: Internal Medicine

## 2021-11-05 DIAGNOSIS — F17200 Nicotine dependence, unspecified, uncomplicated: Secondary | ICD-10-CM

## 2021-11-12 ENCOUNTER — Other Ambulatory Visit: Payer: Self-pay | Admitting: Internal Medicine

## 2021-11-12 DIAGNOSIS — K219 Gastro-esophageal reflux disease without esophagitis: Secondary | ICD-10-CM

## 2021-11-13 ENCOUNTER — Other Ambulatory Visit: Payer: Self-pay | Admitting: Primary Care

## 2021-12-03 ENCOUNTER — Other Ambulatory Visit: Payer: Self-pay | Admitting: Internal Medicine

## 2021-12-03 DIAGNOSIS — I10 Essential (primary) hypertension: Secondary | ICD-10-CM

## 2021-12-04 ENCOUNTER — Other Ambulatory Visit: Payer: Self-pay | Admitting: Internal Medicine

## 2021-12-04 DIAGNOSIS — I10 Essential (primary) hypertension: Secondary | ICD-10-CM

## 2021-12-12 ENCOUNTER — Other Ambulatory Visit: Payer: Self-pay | Admitting: Internal Medicine

## 2021-12-12 DIAGNOSIS — I10 Essential (primary) hypertension: Secondary | ICD-10-CM

## 2021-12-13 IMAGING — CT CT CERVICAL SPINE W/O CM
3 of 4 series · 14 of 33 positions shown, 17 images · non-contrast
Comparison: None.

CLINICAL DATA: Status post fall.  Patient complains of neck pain.

EXAM:
CT HEAD WITHOUT CONTRAST
CT CERVICAL SPINE WITHOUT CONTRAST
TECHNIQUE: Multidetector CT imaging of the head and cervical spine was
performed following the standard protocol without intravenous
contrast. Multiplanar CT image reconstructions of the cervical spine
were also generated.

[Series 8: sag bone · sagittal · 0.36mm/px · 5 of 79 slices shown, 6 images]
[im 27/79  bone]
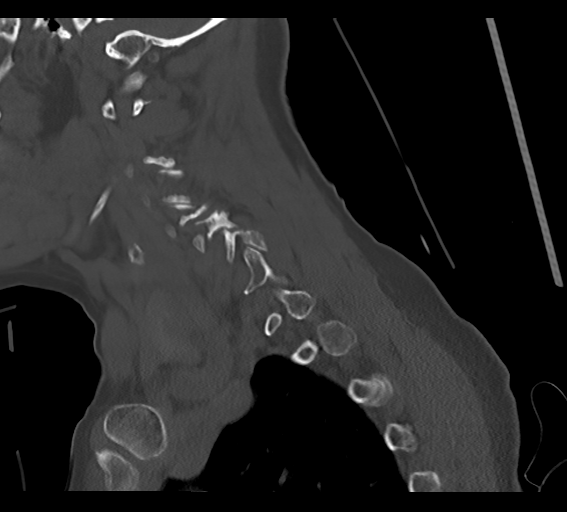
[im 33/79  bone]
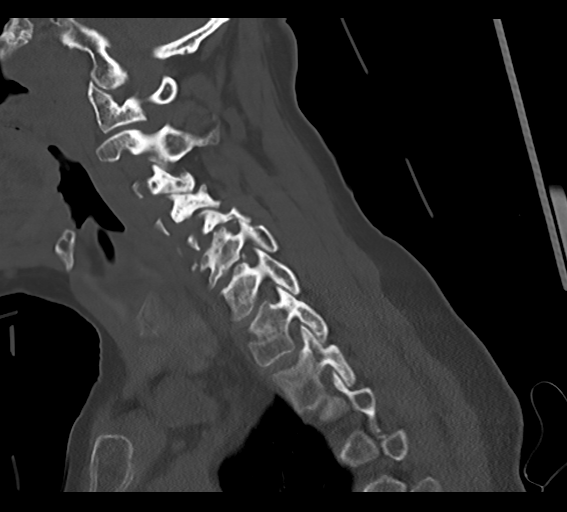
[im 40/79  soft-tissue]
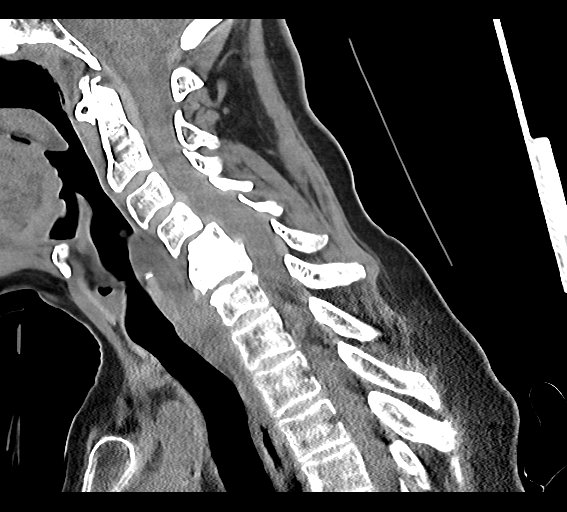
[im 40/79  bone]
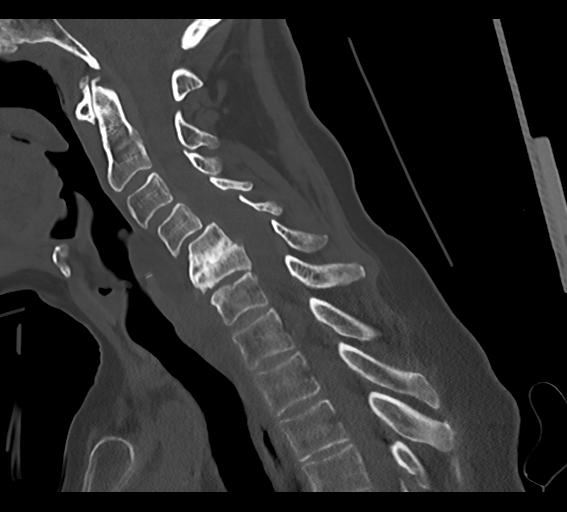
[im 46/79  bone]
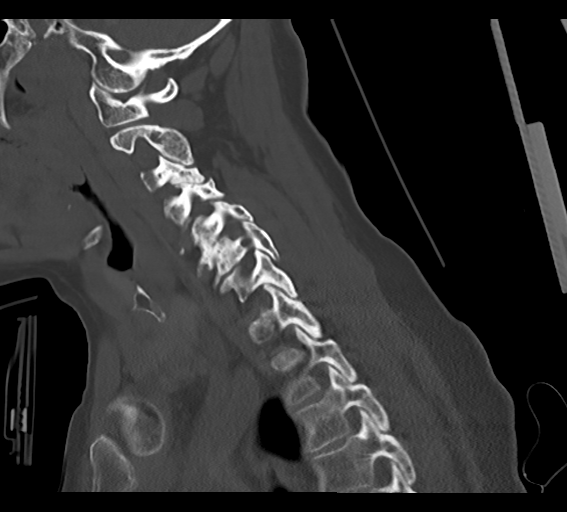
[im 53/79  bone]
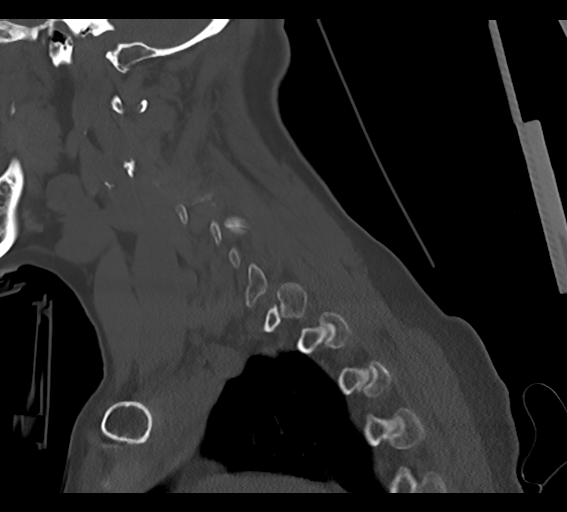

[Series 9: cor bone · coronal · 0.31mm/px · 3 of 97 slices shown]
[im 20/97  bone]
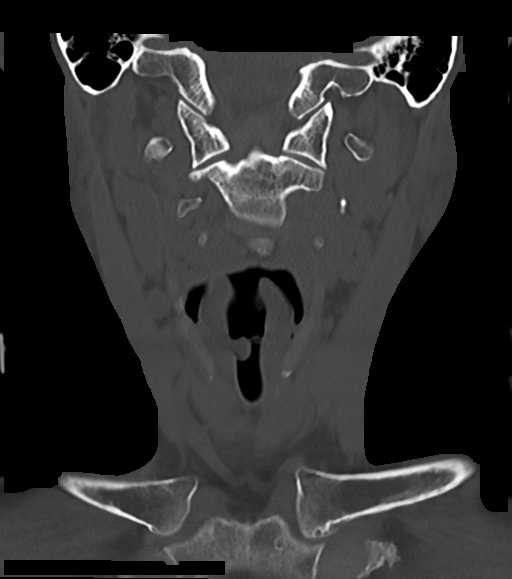
[im 39/97  bone]
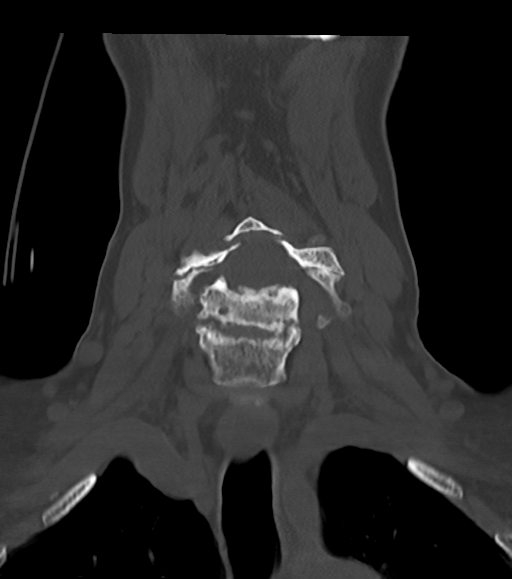
[im 58/97  bone]
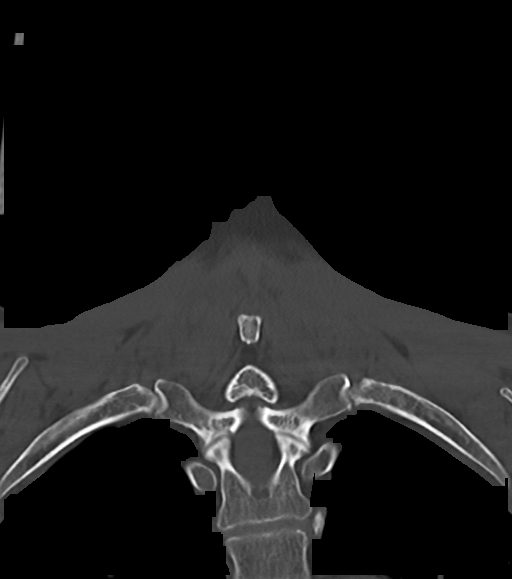

[Series 10: orthogonal axials · axial · 0.21mm/px · z∈[-258,-145]mm · 6 of 100 slices shown, 8 images]
[im 15/100  soft-tissue]
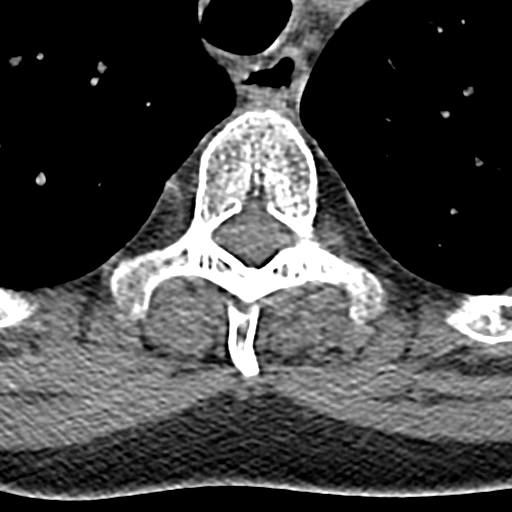
[im 15/100  bone]
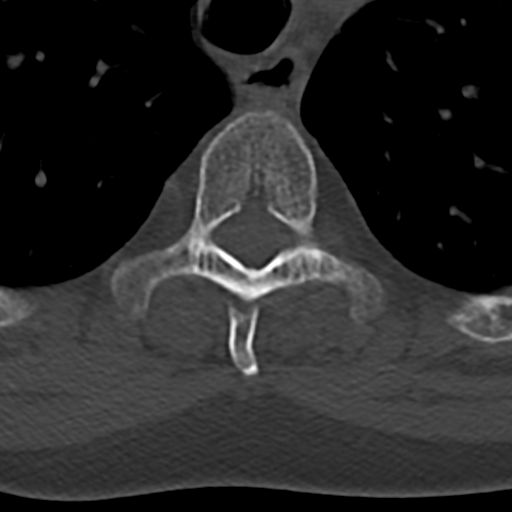
[im 29/100  bone]
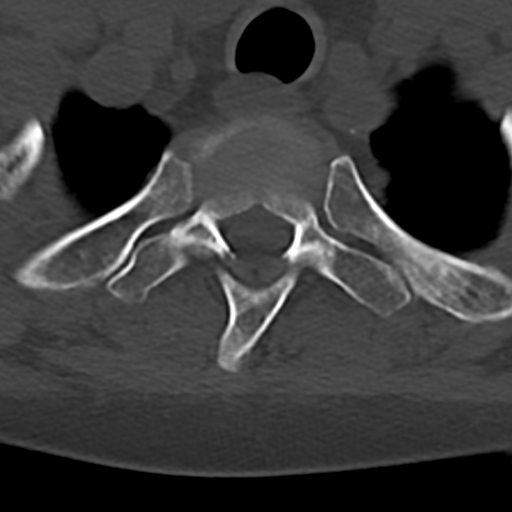
[im 43/100  bone]
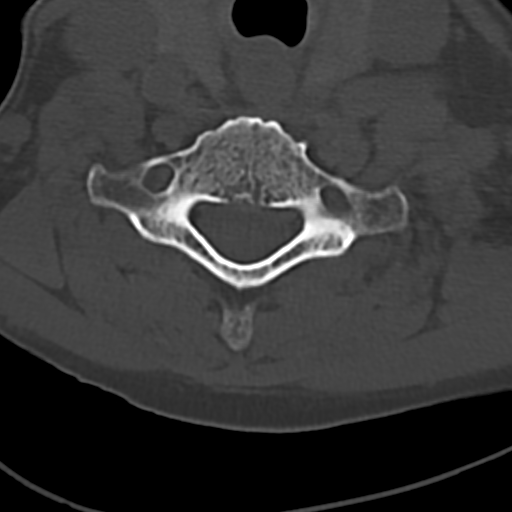
[im 57/100  bone]
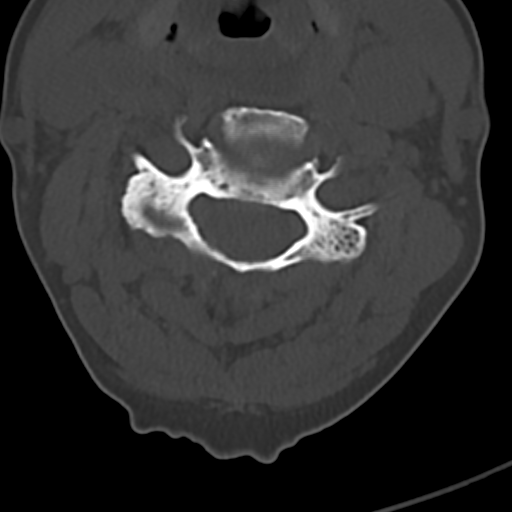
[im 71/100  soft-tissue]
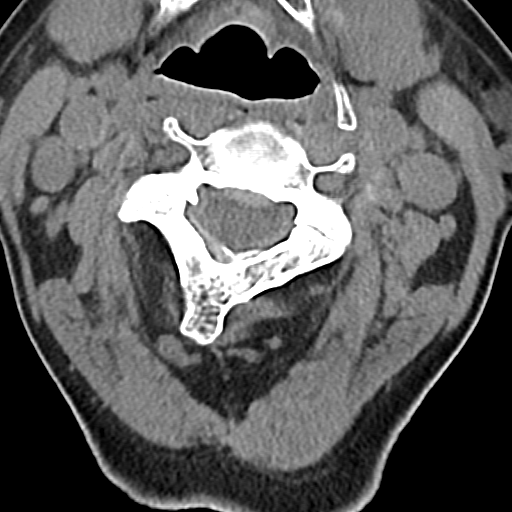
[im 71/100  bone]
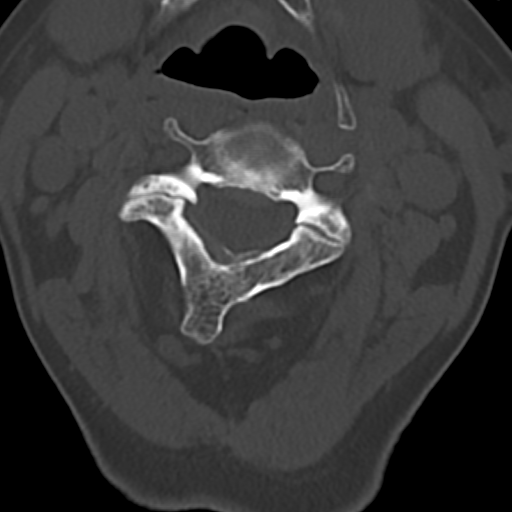
[im 85/100  bone]
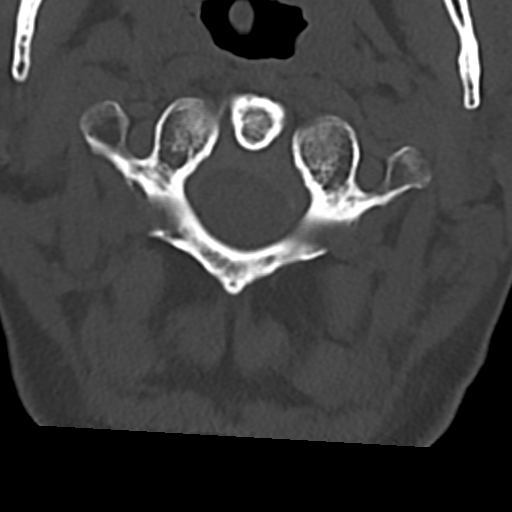

[14 of 33 positions shown; findings below may reference images not displayed]

FINDINGS: CT HEAD FINDINGS

Brain: No evidence of acute infarction, hemorrhage, hydrocephalus,
extra-axial collection or mass lesion/mass effect. There is mild
chronic diffuse atrophy.

Vascular: No hyperdense vessel is noted.

Skull: Normal. Negative for fracture or focal lesion.

Sinuses/Orbits: No acute finding.

Other: None.

CT CERVICAL SPINE FINDINGS

Alignment: There is kyphosis of cervical spine.

Skull base and vertebrae: No acute fracture. No primary bone lesion
or focal pathologic process.

Soft tissues and spinal canal: No prevertebral fluid or swelling. No
visible canal hematoma.

Disc levels: Extensive degenerative joint changes with marked
narrowed joint space and template sclerosis is identified at C5-6.

Upper chest: Negative.

Other: None.
IMPRESSION: 1. No focal acute intracranial abnormality identified.
2. No acute fracture or dislocation of cervical spine.
3. Extensive degenerative joint changes of cervical spine at C5-6.

## 2021-12-13 IMAGING — CR DG KNEE COMPLETE 4+V*R*
4 series · 4 of 4 positions shown · non-contrast
Comparison: None.

CLINICAL DATA: Right knee pain after a fall

EXAM:
RIGHT KNEE - COMPLETE 4+ VIEW

[knee ap]
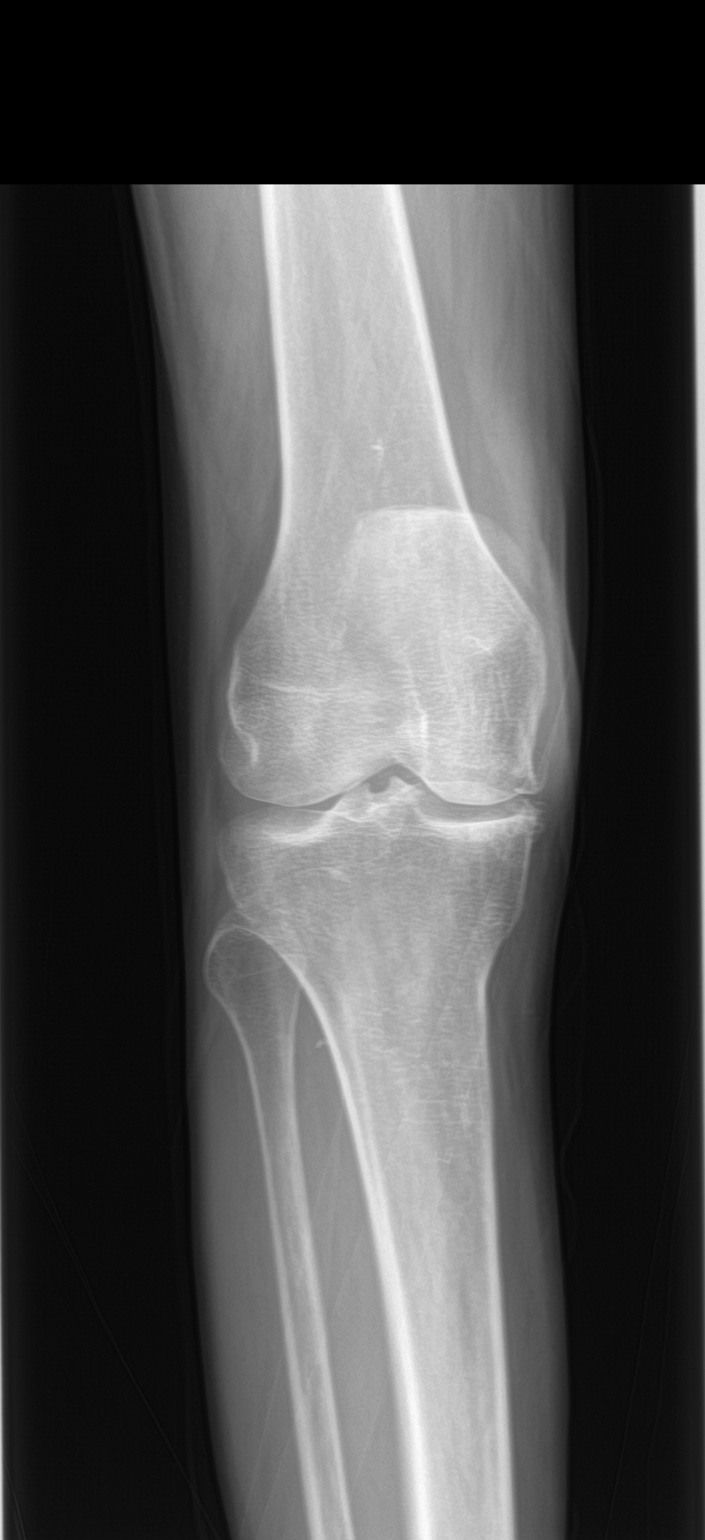

[knee lat]
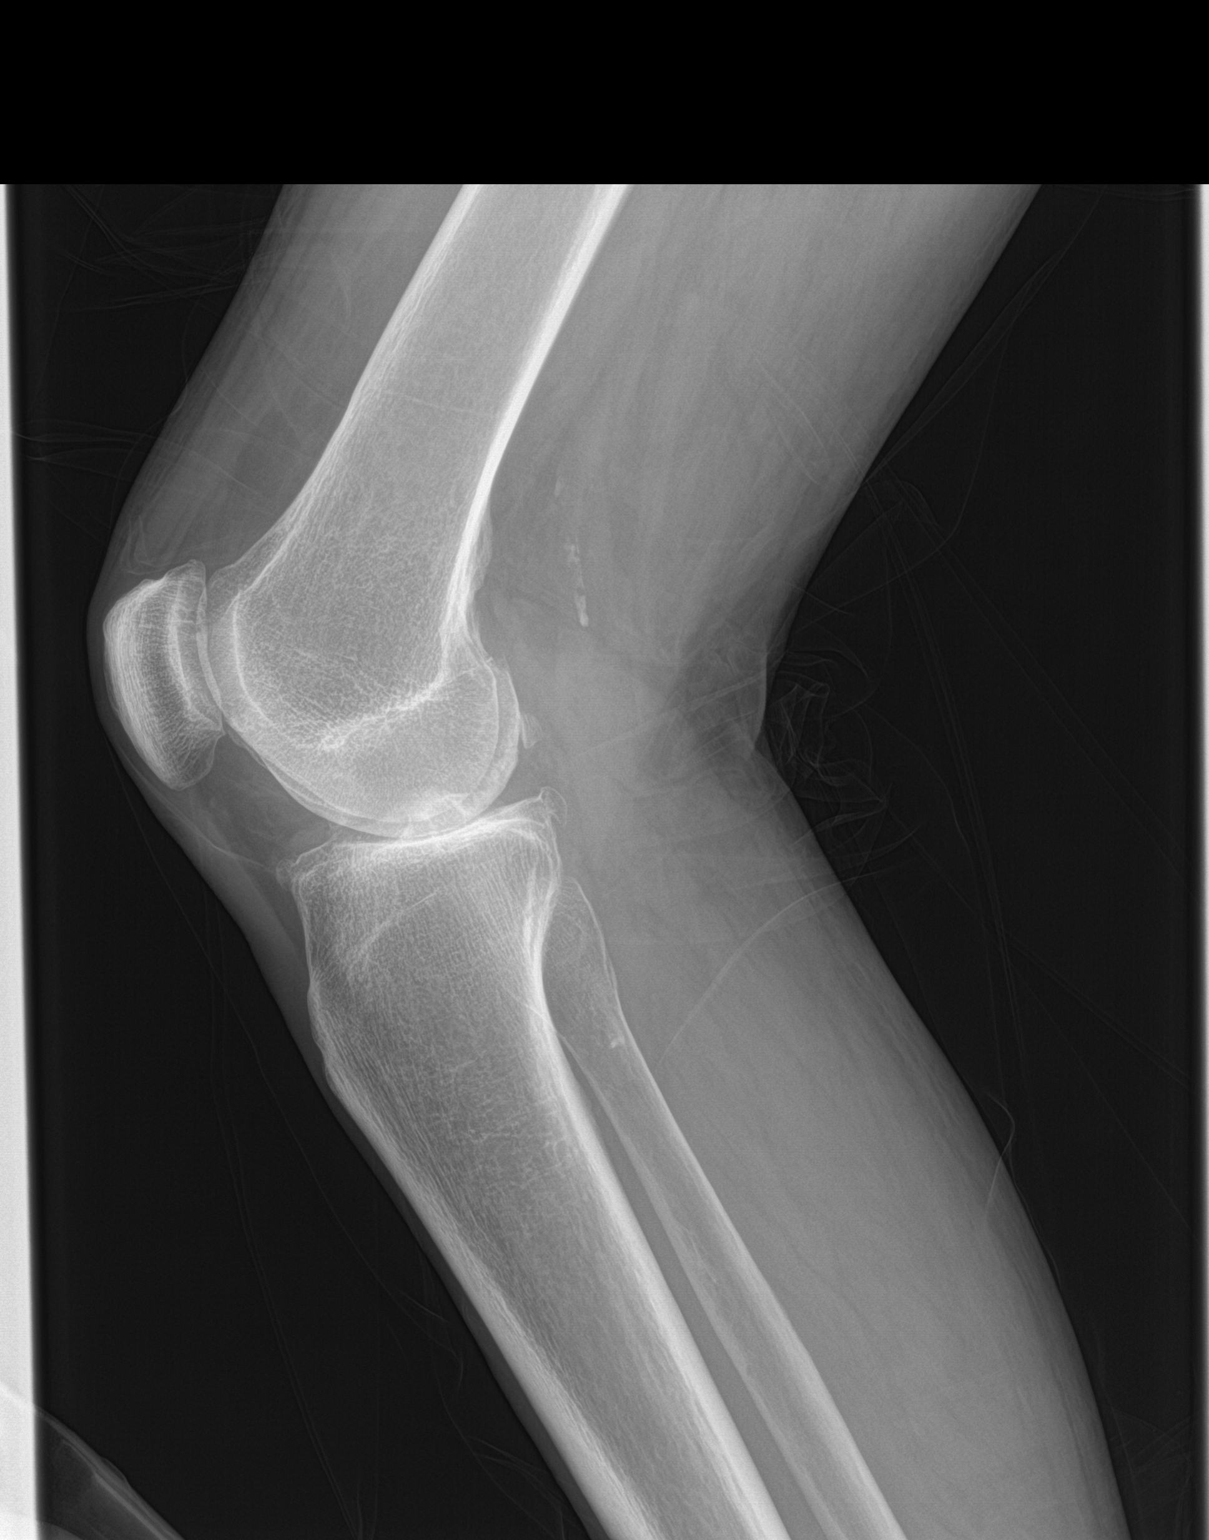

[knee obl (1 of 2)]
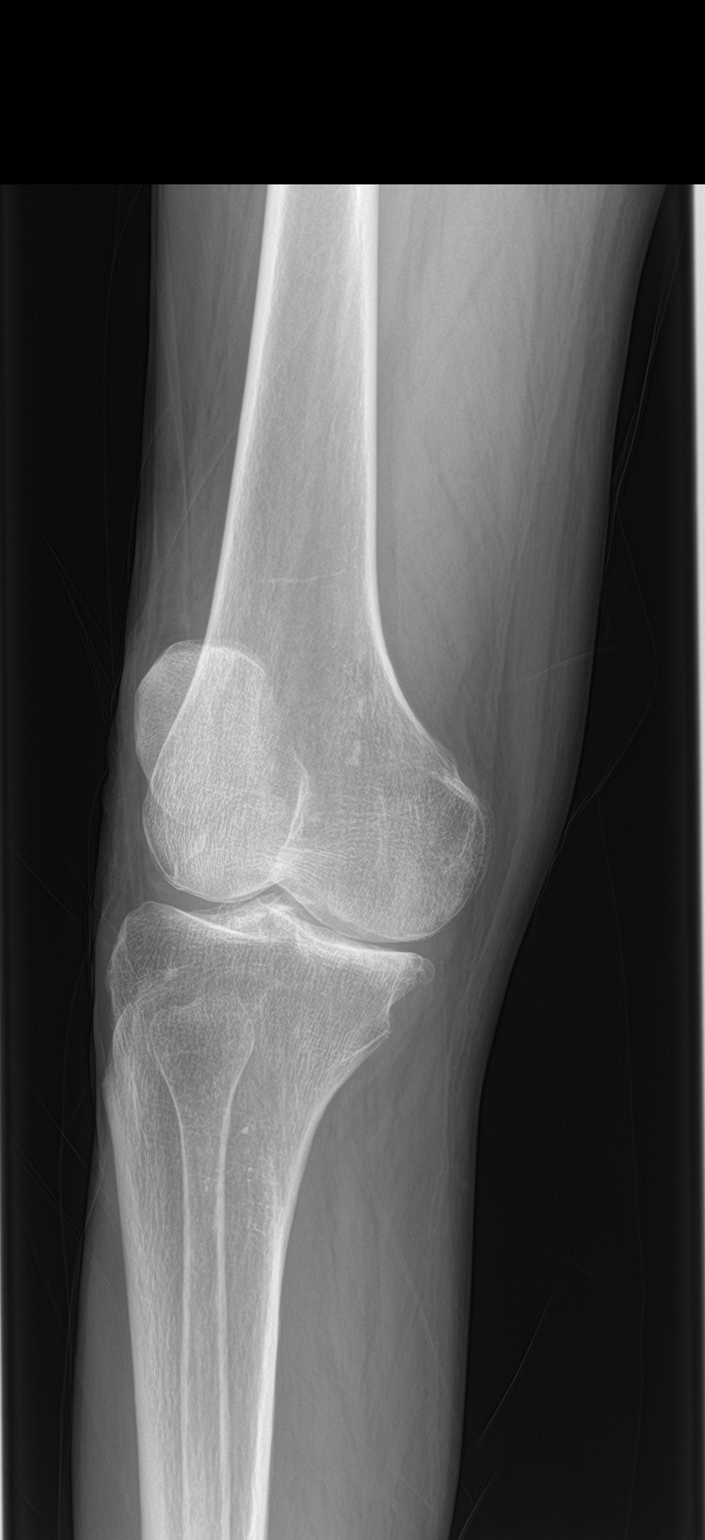

[knee obl (2 of 2)]
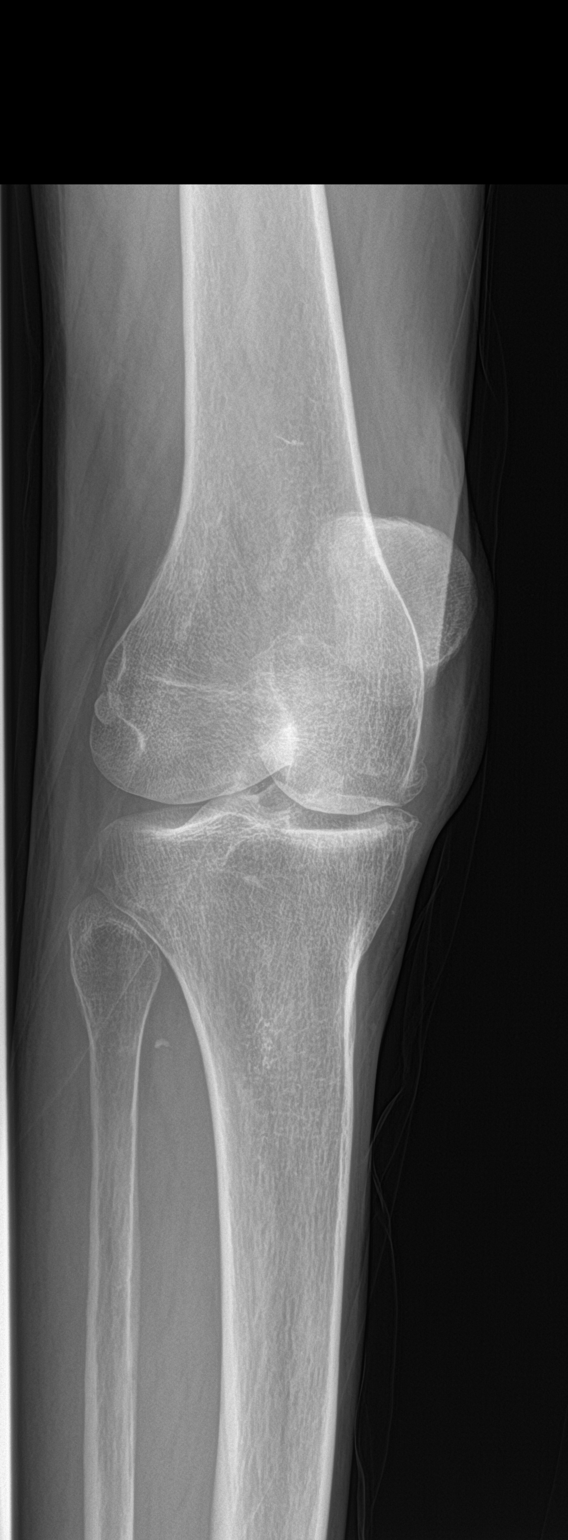

[4 of 4 positions shown; findings below may reference images not displayed]

FINDINGS: Mild joint space narrowing involving all 3 compartments of the knee.
No acute fracture or dislocation. Vascular calcifications. No joint
effusion.
IMPRESSION: Degenerative change, without acute osseous finding.

## 2021-12-13 IMAGING — CR DG HIP (WITH OR WITHOUT PELVIS) 2-3V*R*
3 series · 3 of 3 positions shown · non-contrast
Comparison: None.

CLINICAL DATA: Status post fall with right hip pain.

EXAM:
DG HIP (WITH OR WITHOUT PELVIS) 2-3V RIGHT

[pelvis ap]
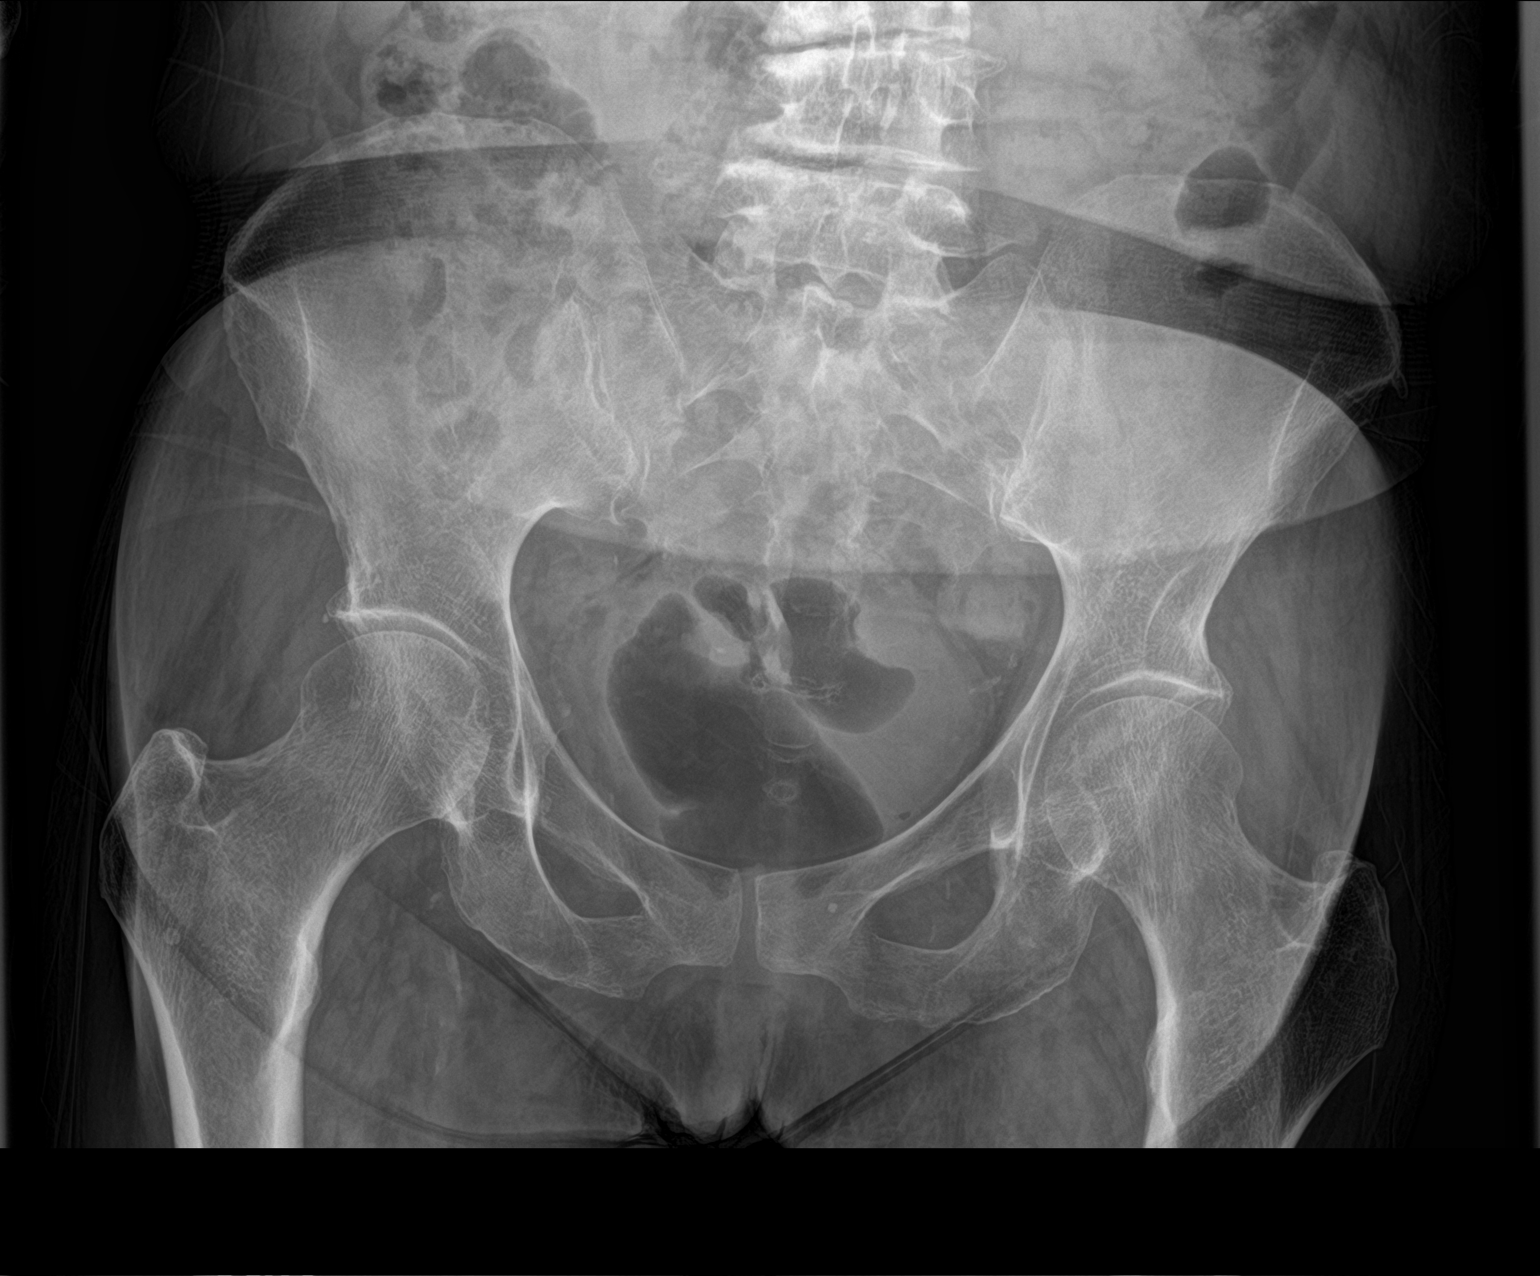

[hip ap]
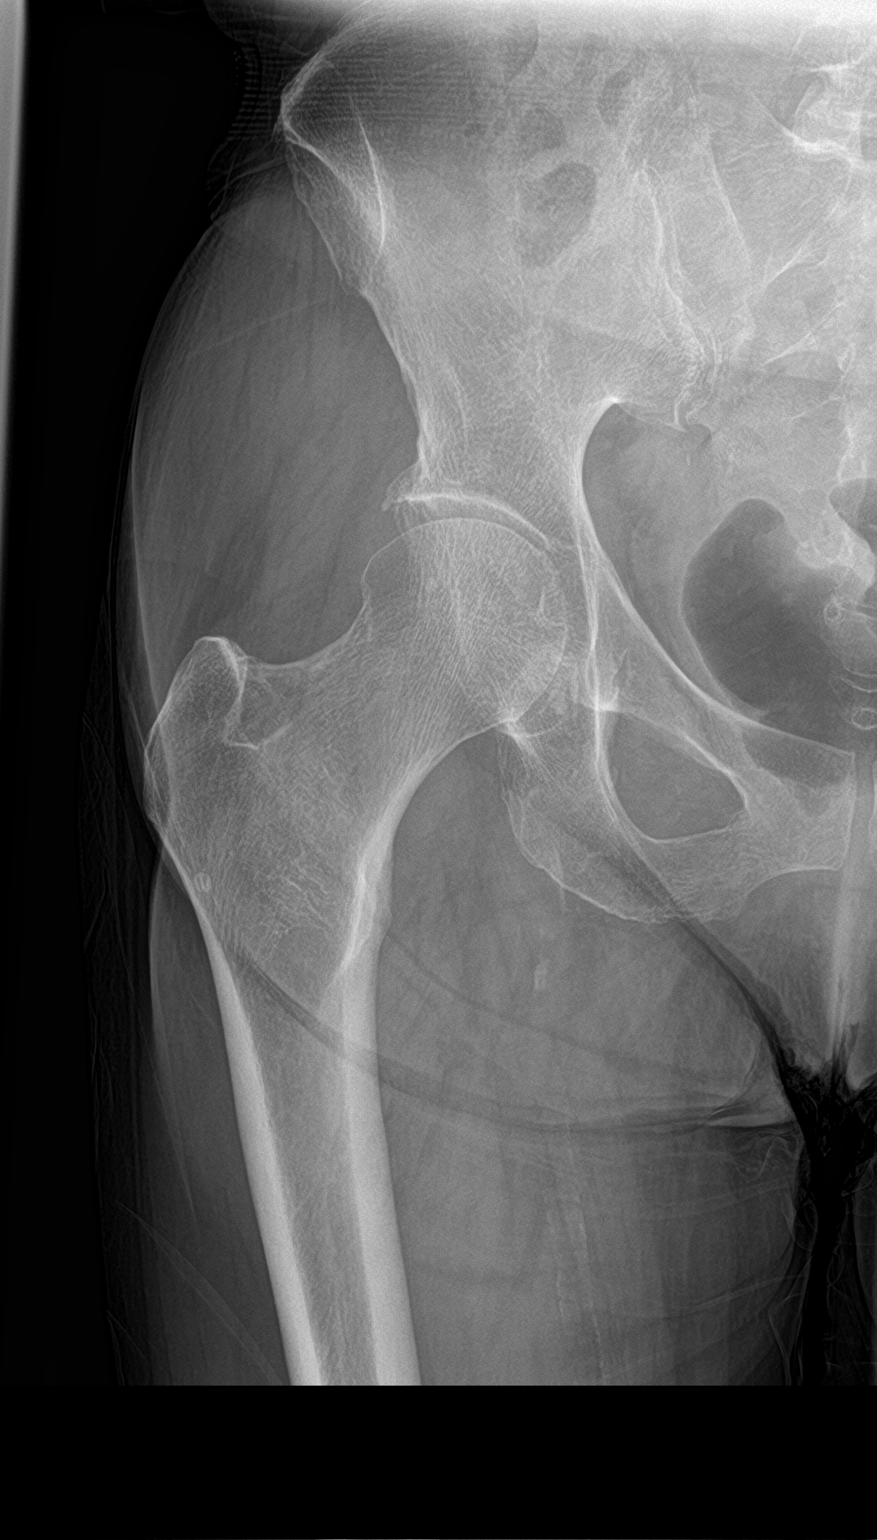

[hip lat]
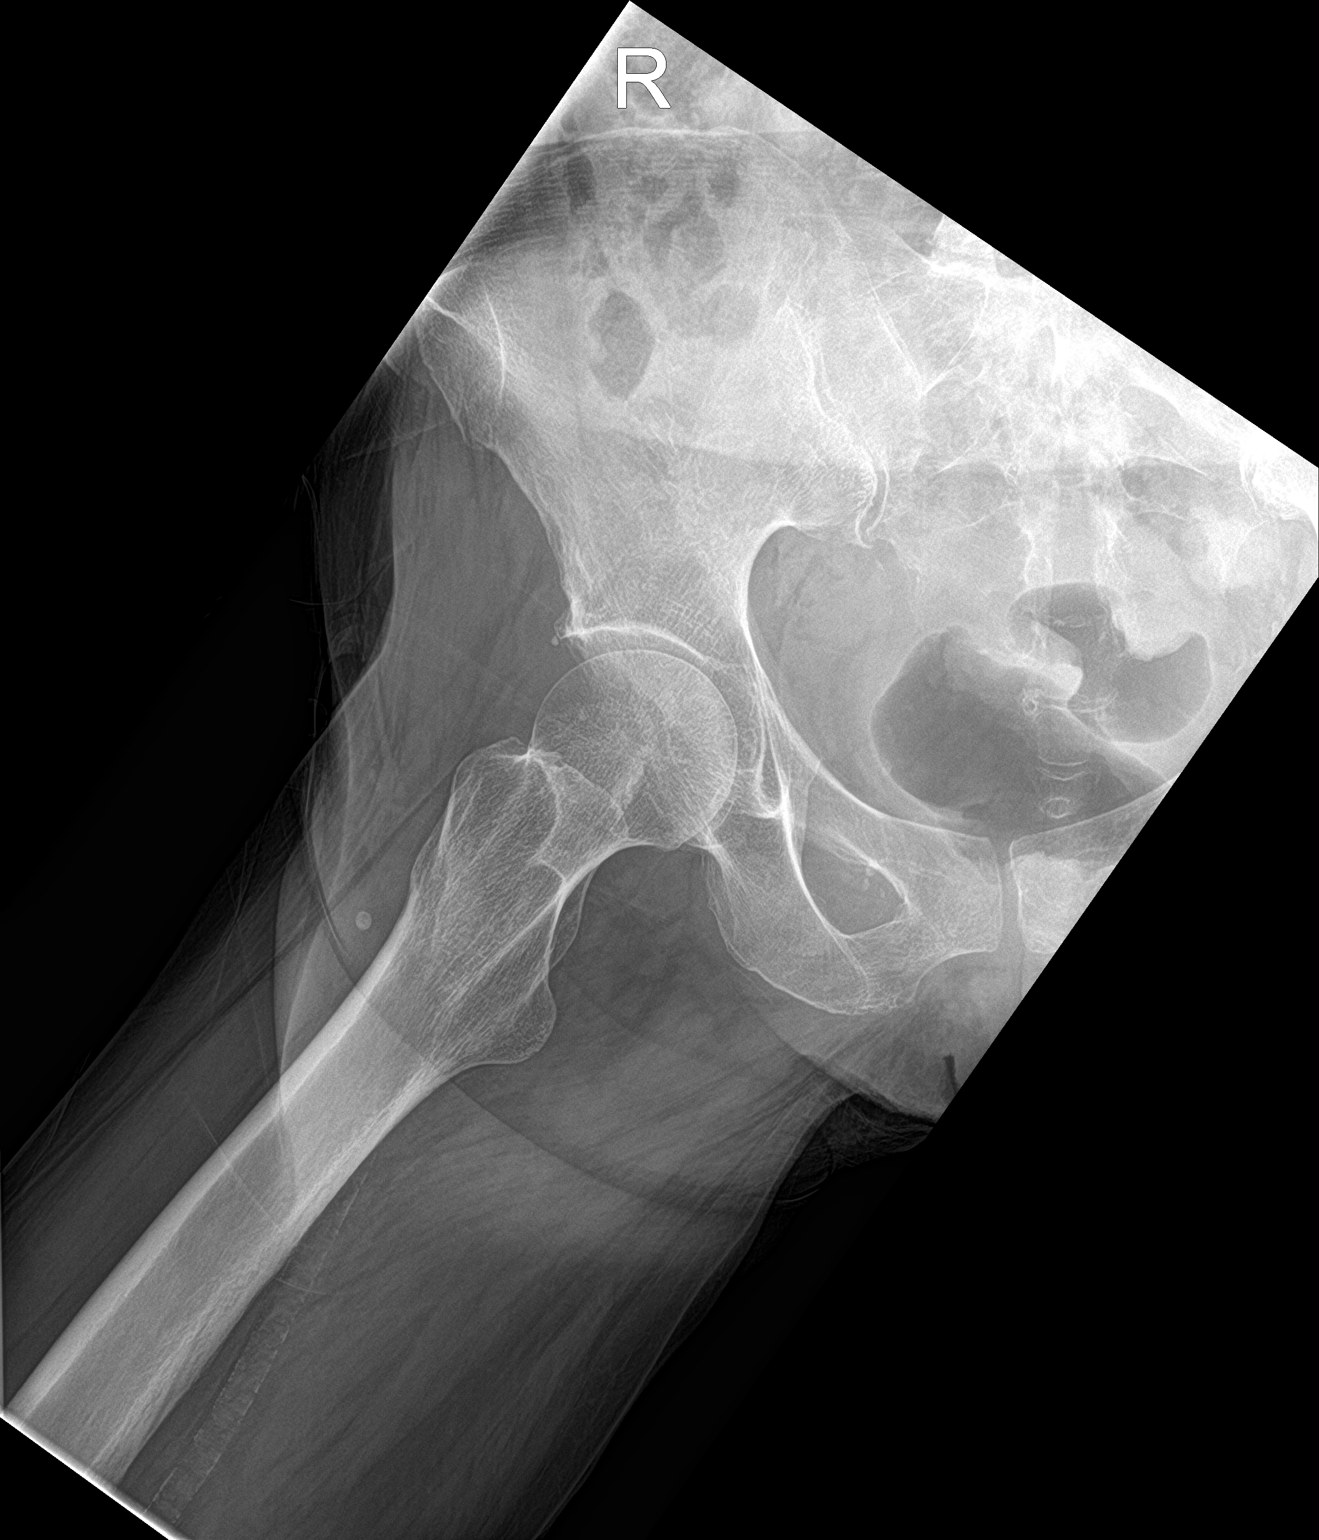

[3 of 3 positions shown; findings below may reference images not displayed]

FINDINGS: There is no evidence of hip fracture or dislocation. Narrow
bilateral hip joint spaces are noted. Degenerative joint changes of
the lower lumbar spine are noted.
IMPRESSION: No acute fracture or dislocation.

## 2021-12-17 ENCOUNTER — Other Ambulatory Visit: Payer: Self-pay | Admitting: Internal Medicine

## 2021-12-17 DIAGNOSIS — I10 Essential (primary) hypertension: Secondary | ICD-10-CM

## 2021-12-17 NOTE — Telephone Encounter (Signed)
Requested medications are due for refill today.  no  Requested medications are on the active medications list.  yes  Last refill. 12/12/2021 #30 0 refills  Future visit scheduled.   no  Notes to clinic.  Note from pharmacy -  To pharmacy: INSURANCE REQUESTING 90 DAY SUPPLY, PLEASE PROVIDE NEW RX FOR 90 DAY SUPPLY   Requested Prescriptions  Pending Prescriptions Disp Refills   irbesartan (AVAPRO) 75 MG tablet [Pharmacy Med Name: irbesartan 75 mg tablet] 30 tablet 0    Sig: TAKE ONE TABLET BY MOUTH DAILY     Cardiovascular:  Angiotensin Receptor Blockers Failed - 12/17/2021 11:47 AM      Failed - Cr in normal range and within 180 days    Creatinine, Ser  Date Value Ref Range Status  06/25/2021 1.54 (H) 0.57 - 1.00 mg/dL Final         Failed - K in normal range and within 180 days    Potassium  Date Value Ref Range Status  06/25/2021 5.6 (H) 3.5 - 5.2 mmol/L Final         Passed - Patient is not pregnant      Passed - Last BP in normal range    BP Readings from Last 1 Encounters:  07/19/21 134/73         Passed - Valid encounter within last 6 months    Recent Outpatient Visits           5 months ago Osteoarthritis of multiple joints, unspecified osteoarthritis type   [facility] [place], Gavin Pound B, MD   6 months ago Acute right-sided low back pain with sciatica, sciatica laterality unspecified   [facility] Marcine Matar, MD   9 months ago Essential hypertension   [facility] Marcine Matar, MD   1 year ago Acute pain of right shoulder   [facility] [place], [place], [place]   1 year ago Need for influenza vaccination   [facility] Drucilla Chalet, RPH-CPP

## 2021-12-20 ENCOUNTER — Other Ambulatory Visit: Payer: Self-pay | Admitting: Internal Medicine

## 2021-12-20 ENCOUNTER — Other Ambulatory Visit: Payer: Self-pay

## 2021-12-20 NOTE — Telephone Encounter (Signed)
Requested medication (s) are due for refill today: Yes  Requested medication (s) are on the active medication list: Yes  Last refill:  Filled by pulmonology   Future visit scheduled: No  Notes to clinic:  Last filled by Pulmonary    Requested Prescriptions  Pending Prescriptions Disp Refills   SPIRIVA HANDIHALER 18 MCG inhalation capsule [Pharmacy Med Name: Spiriva with HandiHaler 18 mcg and inhalation capsules] 30 capsule 2    Sig: place 1 CAPSULE into inhaler AND inhale daily     There is no refill protocol information for this order     albuterol (VENTOLIN HFA) 108 (90 Base) MCG/ACT inhaler [Pharmacy Med Name: albuterol sulfate HFA 90 mcg/actuation aerosol inhaler] 8.5 g 2    Sig: inhale 2 puffs into THE lungs EVERY 6 HOURS AS NEEDED FOR wheezing OR SHORTNESS OF BREATH     There is no refill protocol information for this order

## 2021-12-23 ENCOUNTER — Telehealth: Payer: Self-pay | Admitting: Internal Medicine

## 2021-12-23 ENCOUNTER — Other Ambulatory Visit: Payer: Self-pay | Admitting: Internal Medicine

## 2021-12-23 DIAGNOSIS — I10 Essential (primary) hypertension: Secondary | ICD-10-CM

## 2021-12-23 NOTE — Telephone Encounter (Signed)
Copied from CRM [id number]. Topic: General - Call Back - No Documentation >> Dec 23, 2021  3:17 PM Anna Mcguire wrote: Reason for CRM: Pt called to request transportation for her upcoming appt. And also wants to be notified when she can come and get her lab orders scabies injection. She wants to pick it up and take it over to her pharmacy. She lost her paper  [phone]

## 2021-12-24 NOTE — Telephone Encounter (Signed)
Called patient and daughter answered phone.  Left voicemail to return call. Daughter will have patient to return call.   - may call health insurance to get transportation for appts.   Did not see medication for scabies.

## 2021-12-25 ENCOUNTER — Other Ambulatory Visit: Payer: Self-pay | Admitting: Internal Medicine

## 2021-12-25 NOTE — Telephone Encounter (Signed)
Pt called back and stated she found her paper and wants to know if this paper is still good to go get her Shingrix vaccine.  Pt requesting a call.

## 2021-12-26 ENCOUNTER — Ambulatory Visit: Payer: Self-pay | Admitting: *Deleted

## 2021-12-26 NOTE — Telephone Encounter (Signed)
3rd attempt to contact patient regarding dizziness and diarrhea x 2 months since taking Wellbutrin. No answer, LVMTCB on # [phone] to call #[phone].

## 2021-12-26 NOTE — Telephone Encounter (Signed)
Summary: Dizziness & Diarrhea Advice   Pt is calling to dizziness and diarrhea while taking buPROPion ER (WELLBUTRIN SR) 100 MG 12 hr tablet [[id number]] for 2 months. Please advise        Called [phone] and not in service. Called [phone] and no answer. LVMTCB [phone]. Calling due to dizziness and diarrhea x 2 months .

## 2021-12-26 NOTE — Telephone Encounter (Signed)
Call placed to number and VM was left informing patient that script is still valid and she can get her vaccine donr

## 2021-12-26 NOTE — Telephone Encounter (Signed)
Summary: Dizziness & Diarrhea Advice   Pt is calling to dizziness and diarrhea while taking buPROPion ER (WELLBUTRIN SR) 100 MG 12 hr tablet [[id number]] for 2 months. Please advise     Called pt and LM on VM. Also attempted mobile number but it is not a working number. Provided call back number.

## 2021-12-30 ENCOUNTER — Telehealth: Payer: Self-pay | Admitting: Internal Medicine

## 2021-12-30 ENCOUNTER — Ambulatory Visit: Payer: Self-pay

## 2021-12-30 NOTE — Telephone Encounter (Signed)
Wanting medication for smoking cessation. Pt stopped Wellbutrin 4 days into therapy because of dizziness and diarrhea. Pt stated she was wanting a medication refill, but stated PCP will refill until seen in office. Appt scheduled tomorrow with PCP.

## 2021-12-31 ENCOUNTER — Encounter: Payer: Self-pay | Admitting: Internal Medicine

## 2021-12-31 ENCOUNTER — Other Ambulatory Visit: Payer: Self-pay | Admitting: Internal Medicine

## 2021-12-31 ENCOUNTER — Ambulatory Visit: Payer: Medicare Other | Attending: Internal Medicine | Admitting: Internal Medicine

## 2021-12-31 VITALS — BP 135/72 | HR 73 | Temp 97.2°F | Resp 20 | Wt 114.0 lb

## 2021-12-31 DIAGNOSIS — F17200 Nicotine dependence, unspecified, uncomplicated: Secondary | ICD-10-CM

## 2021-12-31 DIAGNOSIS — I10 Essential (primary) hypertension: Secondary | ICD-10-CM | POA: Diagnosis not present

## 2021-12-31 DIAGNOSIS — F17210 Nicotine dependence, cigarettes, uncomplicated: Secondary | ICD-10-CM

## 2021-12-31 DIAGNOSIS — D539 Nutritional anemia, unspecified: Secondary | ICD-10-CM | POA: Diagnosis not present

## 2021-12-31 DIAGNOSIS — J449 Chronic obstructive pulmonary disease, unspecified: Secondary | ICD-10-CM

## 2021-12-31 DIAGNOSIS — M1712 Unilateral primary osteoarthritis, left knee: Secondary | ICD-10-CM

## 2021-12-31 DIAGNOSIS — N1832 Chronic kidney disease, stage 3b: Secondary | ICD-10-CM | POA: Diagnosis not present

## 2021-12-31 DIAGNOSIS — E785 Hyperlipidemia, unspecified: Secondary | ICD-10-CM

## 2021-12-31 DIAGNOSIS — F1010 Alcohol abuse, uncomplicated: Secondary | ICD-10-CM

## 2021-12-31 MED ORDER — AMLODIPINE BESYLATE 10 MG PO TABS: ORAL_TABLET | ORAL | 1 refills | Status: DC

## 2021-12-31 MED ORDER — NICOTINE 21 MG/24HR TD PT24: 21.0000 mg | MEDICATED_PATCH | Freq: Every day | TRANSDERMAL | 2 refills | Status: DC

## 2021-12-31 MED ORDER — SPIRIVA HANDIHALER 18 MCG IN CAPS: ORAL_CAPSULE | RESPIRATORY_TRACT | 6 refills | Status: DC

## 2021-12-31 MED ORDER — SPIRIVA HANDIHALER 18 MCG IN CAPS: ORAL_CAPSULE | RESPIRATORY_TRACT | 2 refills | Status: DC

## 2021-12-31 MED ORDER — ATORVASTATIN CALCIUM 80 MG PO TABS: 80.0000 mg | ORAL_TABLET | Freq: Every day | ORAL | 1 refills | Status: DC

## 2021-12-31 MED ORDER — ALBUTEROL SULFATE (2.5 MG/3ML) 0.083% IN NEBU: INHALATION_SOLUTION | RESPIRATORY_TRACT | 5 refills | Status: AC

## 2021-12-31 MED ORDER — ALBUTEROL SULFATE HFA 108 (90 BASE) MCG/ACT IN AERS: 2.0000 | INHALATION_SPRAY | Freq: Four times a day (QID) | RESPIRATORY_TRACT | 6 refills | Status: DC | PRN

## 2021-12-31 MED ORDER — IRBESARTAN 75 MG PO TABS: ORAL_TABLET | ORAL | 1 refills | Status: DC

## 2021-12-31 NOTE — Progress Notes (Signed)
States buproprion gives diarrhea Would like to try something else for smoking cessation   Several falls w/i last year  Arthritis in shoulder and  left knee. Increasing in pain 8/10, constant pain   Would like shower chair, BP machine, rolator

## 2021-12-31 NOTE — Progress Notes (Signed)
Patient ID: Anna Mcguire, female    DOB: 04-Oct-1955  MRN: [id number]  CC: Medication Refill   Subjective: Anna Mcguire is a 66 y.o. female who presents for chronic ds management Her concerns today include:  Patient with history of HTN, HL, tob dep, COPD, OA left knee, EtOH abuse, chronic diarrhea, SIADH   Tob dep: still smoking 5-6 cig/day Wellbutrin caused diarrhea and dizziness Reports Chantix caused same thing in past and also caused fainting spells.   COPD: Request RF on Spiriva and Albuterol.  Out of Spirvia 3-6 mths.  Using albeterol Q 6 hrs since being out of Spiriva.  Wants new Neb machine as her current one no longer works.  She has had it for many years. Reports cough and wheezing in a.m and late at nights.  No blood in sputum  HTN: compliant with meds Norvasc 10 mg and Avapro 75 mg daily and salt restriction. No CP or LE edema at this time  OA left knee:  request rxn for shower chair and rollator walker.  Prescription given on last visit for shower chair but patient states she misplaced the prescription and never got it. Uses cane but does not feel steady with it Fell x 2 this year.  On one occasion she was reaching for something and fell over.  On another occasion she was about to lay in bed and fell backwards.   She continues to drink.  On average she drinks about 240 ounce beers 3 times a week.  She knows that she is drinking too much.  Her kidney function has not been 100%.  Last 2 GFR's were 48 and 37.  Last creatinine was 1.54.  Denies use of NSAIDs.  Sodium also tends to run low with elevated calcium level.  Work-up of hypercalcemia revealed no M spike on serum protein electrophoresis.  TSH mildly decreased with normal free T3 and free T4.  Vitamin D level normal.  PTH was in the upper limit of normal at 64.  Patient has had stable macrocytic anemia with normal B12 and folate levels.  Denies any dizziness.  Reports compliance with taking atorvastatin.   Requests refills on several medications today. Patient Active Problem List   Diagnosis Date Noted   Hypercalcemia 12/31/2021   Stage 3b chronic kidney disease (HCC) 12/31/2021   Allergic rhinitis 10/24/2020   Rotator cuff arthropathy of left shoulder 08/07/2020   Cervicalgia 08/07/2020   Chronic cough 03/21/2020   Alcohol use disorder, moderate, dependence (HCC) 01/30/2020   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   Tobacco dependence 01/30/2020   Diarrhea of infectious origin 01/30/2020   Hyponatremia 01/13/2020   Alcohol abuse 01/13/2020   Tobacco abuse 01/13/2020   Chronic diarrhea 01/13/2020   Hypophosphatemia 01/13/2020   Hypertension    Chronic obstructive pulmonary disease (HCC)    Alcohol use    Hypokalemia 01/12/2020   Tobacco use 10/06/2019   Primary osteoarthritis of left knee 09/08/2019   Pain in right shoulder 08/11/2019     Current Outpatient Medications on File Prior to Visit  Medication Sig Dispense Refill   loratadine (CLARITIN) 10 MG tablet Take 1 tablet (10 mg total) by mouth daily. (Patient not taking: Reported on 12/31/2021) 90 tablet 2   pantoprazole (PROTONIX) 40 MG tablet TAKE ONE TABLET BY MOUTH EVERY DAY (Patient not taking: Reported on 12/31/2021) 90 tablet 0   [DISCONTINUED] fluticasone (FLONASE) 50 MCG/ACT nasal spray Place 1 spray into both nostrils daily.     [  DISCONTINUED] lisinopril-hydrochlorothiazide (PRINZIDE,ZESTORETIC) 20-12.5 MG per tablet Take 1 tablet by mouth daily.     [DISCONTINUED] lovastatin (MEVACOR) 20 MG tablet Take 20 mg by mouth daily at 6 PM.     No current facility-administered medications on file prior to visit.    Allergies  Allergen Reactions   Chantix [Varenicline Tartrate]     Diarrhea and dizziness   Penicillins Diarrhea   Wellbutrin [Bupropion] Other (See Comments)    Diarrhea and dizziness    Social History   Socioeconomic History   Marital status: Single    Spouse name: Not on file    Number of children: Not on file   Years of education: Not on file   Highest education level: Not on file  Occupational History   Not on file  Tobacco Use   Smoking status: Every Day    Packs/day: 0.50    Years: 48.00    Total pack years: 24.00    Types: Cigarettes   Smokeless tobacco: Never   Tobacco comments:    3 times a week - 10/24/2020  Substance and Sexual Activity   Alcohol use: Yes    Comment: occ   Drug use: Yes    Types: Marijuana   Sexual activity: Not Currently  Other Topics Concern   Not on file  Social History Narrative   Not on file   Social Determinants of Health   Financial Resource Strain: Not on file  Food Insecurity: Not on file  Transportation Needs: Not on file  Physical Activity: Not on file  Stress: Not on file  Social Connections: Not on file  Intimate Partner Violence: Not on file    Family History  Problem Relation Age of Onset   Kidney failure Mother    Aneurysm Father     Past Surgical History:  Procedure Laterality Date   COLONOSCOPY WITH PROPOFOL N/A 06/07/2014   Procedure: COLONOSCOPY WITH PROPOFOL;  Surgeon: Willis Modena, MD;  Location: [facility];  Service: Endoscopy;  Laterality: N/A;   ESOPHAGOGASTRODUODENOSCOPY (EGD) WITH PROPOFOL N/A 06/07/2014   Procedure: ESOPHAGOGASTRODUODENOSCOPY (EGD) WITH PROPOFOL;  Surgeon: Willis Modena, MD;  Location: [facility];  Service: Endoscopy;  Laterality: N/A;    ROS: Review of Systems Negative except as stated above  PHYSICAL EXAM: BP 135/72 (BP Location: Left Arm, Patient Position: Sitting, Cuff Size: Small)   Pulse 73   Temp (!) 97.2 F (36.2 C) (Oral)   Resp 20   Wt 114 lb (51.7 kg)   SpO2 100%   BMI 20.19 kg/m   Physical Exam  General appearance -elderly African female in NAD Mental status - normal mood, behavior, speech, dress, motor activity, and thought processes Neck - supple, no significant adenopathy Chest - clear to auscultation, no wheezes, rales or rhonchi,  symmetric air entry Heart - normal rate, regular rhythm, normal S1, S2, no murmurs, rubs, clicks or gallops Musculoskeletal -patient ambulates with a cane.  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  Gait is slowed with low foot to floor clearance. Extremities -no lower extremity edema      Latest Ref Rng & Units 06/25/2021    3:00 PM 03/20/2021    2:54 PM 02/09/2020   11:12 AM  CMP  Glucose 70 - 99 mg/dL 81  79    BUN 8 - 27 mg/dL 26  20    Creatinine 6.96 - 1.00 mg/dL 2.95  2.84    Sodium 132 - 144 mmol/L 133  137  Potassium 3.5 - 5.2 mmol/L 5.6  5.6  5.0   Chloride 96 - 106 mmol/L 102  101    CO2 20 - 29 mmol/L 19  20    Calcium 8.7 - 10.3 mg/dL 01.6  01.0    Total Protein 6.0 - 8.5 g/dL 7.1  6.8    Total Bilirubin 0.0 - 1.2 mg/dL  0.4    Alkaline Phos 44 - 121 IU/L  114    AST 0 - 40 IU/L  28    ALT 0 - 32 IU/L  19     Lipid Panel     Component Value Date/Time   CHOL 155 03/20/2021 1454   TRIG 258 (H) 03/20/2021 1454   HDL 82 03/20/2021 1454   CHOLHDL 1.9 03/20/2021 1454   CHOLHDL 1.5 01/15/2020 0258   VLDL 23 01/15/2020 0258   LDLCALC 35 03/20/2021 1454    CBC    Component Value Date/Time   WBC 7.1 06/25/2021 1500   WBC 4.8 01/17/2020 0456   RBC 3.21 (L) 06/25/2021 1500   RBC 2.51 (L) 01/17/2020 0456   HGB 10.4 (L) 06/25/2021 1500   HCT 31.4 (L) 06/25/2021 1500   PLT 312 06/25/2021 1500   MCV 98 (H) 06/25/2021 1500   MCH 32.4 06/25/2021 1500   MCH 31.5 01/17/2020 0456   MCHC 33.1 06/25/2021 1500   MCHC 33.1 01/17/2020 0456   RDW 13.0 06/25/2021 1500   LYMPHSABS 0.9 01/17/2020 0456   MONOABS 0.5 01/17/2020 0456   EOSABS 0.1 01/17/2020 0456   BASOSABS 0.0 01/17/2020 0456    ASSESSMENT AND PLAN: 1. Essential hypertension Close to goal.  Continue Norvasc and Avapro. - amLODipine (NORVASC) 10 MG tablet; TAKE ONE TABLET BY MOUTH DAILY FOR blood pressure  Dispense: 90 tablet; Refill: 1 - irbesartan (AVAPRO) 75 MG tablet; TAKE ONE  TABLET BY MOUTH DAILY  Dispense: 90 tablet; Refill: 1 - CBC - Comprehensive metabolic panel  2. Chronic obstructive pulmonary disease, unspecified COPD type (HCC) Refill Spiriva.  Encouraged her to use it every day.  In doing so I think she will not have to use the albuterol inhaler as often as she is having to use it now. Strongly advised to quit smoking - tiotropium (SPIRIVA HANDIHALER) 18 MCG inhalation capsule; place ONE CAPSULE into inhaler AND inhale daily  Dispense: 30 capsule; Refill: 6 - albuterol (VENTOLIN HFA) 108 (90 Base) MCG/ACT inhaler; Inhale 2 puffs into the lungs every 6 (six) hours as needed for wheezing or shortness of breath.  Dispense: 8.5 g; Refill: 6 - For home use only DME Other see comment - albuterol (PROVENTIL) (2.5 MG/3ML) 0.083% nebulizer solution; inhale THE contents of 1 vial PER nebulizer EVERY 6 HOURS AS NEEDED FOR wheezing OR SHORTNESS OF BREATH  Dispense: 150 mL; Refill: 5  3. Primary osteoarthritis of left knee Prescription given for bath chair and rollator walker. - For home use only DME Other see comment  4. Tobacco dependence Pt is current smoker. Patient advised to quit smoking. Discussed health risks associated with smoking including lung and other types of cancers, chronic lung diseases and CV risks.. Pt ready to give trail of quitting.   Discussed methods to help quit including quitting cold [place], use of NRT, Chantix and Bupropion.  She did not tolerate Chantix and Wellbutrin.  She is willing to try the nicotine patches.  I went over with her the stepdown approach.  We will start her on the 21 mg patch. Pt wanting to try: __3  Minutes spent on counseling. F/U: Reassess progress on next visit.  - nicotine (NICODERM CQ - DOSED IN MG/24 HOURS) 21 mg/24hr patch; Place 1 patch (21 mg total) onto the skin daily.  Dispense: 28 patch; Refill: 2  5. Alcohol abuse Strongly advised to quit or to cut back.  I went over with her again recommendations of no  more than 1 standard drink a day for women.  Discussed health risks associated with excessive alcohol use.  6. Stage 3b chronic kidney disease (HCC) Recheck kidney function today.  Advised to avoid NSAIDs. - Comprehensive metabolic panel  7. Macrocytic anemia Stable.  Recheck today. - CBC  8. Hyperlipidemia, unspecified hyperlipidemia type - atorvastatin (LIPITOR) 80 MG tablet; Take 1 tablet (80 mg total) by mouth daily.  Dispense: 90 tablet; Refill: 1  9. Hypercalcemia Most likely primary hyperparathyroidism.    Patient was given the opportunity to ask questions.  Patient verbalized understanding of the plan and was able to repeat key elements of the plan.   This documentation was completed using Paediatric nurse.  Any transcriptional errors are unintentional.  Orders Placed This Encounter  Procedures   For home use only DME Other see comment   CBC   Comprehensive metabolic panel     Requested Prescriptions   Signed Prescriptions Disp Refills   nicotine (NICODERM CQ - DOSED IN MG/24 HOURS) 21 mg/24hr patch 28 patch 2    Sig: Place 1 patch (21 mg total) onto the skin daily.   tiotropium (SPIRIVA HANDIHALER) 18 MCG inhalation capsule 30 capsule 6    Sig: place ONE CAPSULE into inhaler AND inhale daily   albuterol (VENTOLIN HFA) 108 (90 Base) MCG/ACT inhaler 8.5 g 6    Sig: Inhale 2 puffs into the lungs every 6 (six) hours as needed for wheezing or shortness of breath.   atorvastatin (LIPITOR) 80 MG tablet 90 tablet 1    Sig: Take 1 tablet (80 mg total) by mouth daily.   amLODipine (NORVASC) 10 MG tablet 90 tablet 1    Sig: TAKE ONE TABLET BY MOUTH DAILY FOR blood pressure   irbesartan (AVAPRO) 75 MG tablet 90 tablet 1    Sig: TAKE ONE TABLET BY MOUTH DAILY   albuterol (PROVENTIL) (2.5 MG/3ML) 0.083% nebulizer solution 150 mL 5    Sig: inhale THE contents of 1 vial PER nebulizer EVERY 6 HOURS AS NEEDED FOR wheezing OR SHORTNESS OF BREATH    Return in  about 3 months (around 04/02/2022).  Jonah Blue, MD, FACP

## 2022-01-03 ENCOUNTER — Telehealth: Payer: Self-pay | Admitting: Internal Medicine

## 2022-01-03 ENCOUNTER — Ambulatory Visit: Payer: Medicare Other | Attending: Internal Medicine

## 2022-01-03 DIAGNOSIS — E875 Hyperkalemia: Secondary | ICD-10-CM | POA: Diagnosis not present

## 2022-01-03 MED ORDER — DICLOFENAC SODIUM 1 % EX GEL: 2.0000 g | Freq: Four times a day (QID) | CUTANEOUS | 1 refills | Status: DC

## 2022-01-03 NOTE — Telephone Encounter (Signed)
I don't see the medication on her current med list.

## 2022-01-03 NOTE — Telephone Encounter (Signed)
Pt requesting a refill on Diclofenac Sodium Topical Gel , asked for refill on 6/26 didn't receive it at the pharmacy.

## 2022-01-04 ENCOUNTER — Other Ambulatory Visit: Payer: Self-pay | Admitting: Internal Medicine

## 2022-01-04 ENCOUNTER — Telehealth: Payer: Self-pay | Admitting: Internal Medicine

## 2022-01-04 DIAGNOSIS — D649 Anemia, unspecified: Secondary | ICD-10-CM

## 2022-01-04 LAB — BASIC METABOLIC PANEL
BUN/Creatinine Ratio: 25 (ref 12–28)
BUN: 25 mg/dL (ref 8–27)
CO2: 22 mmol/L (ref 20–29)
Calcium: 11.3 mg/dL — ABNORMAL HIGH (ref 8.7–10.3)
Chloride: 105 mmol/L (ref 96–106)
Creatinine, Ser: 0.99 mg/dL (ref 0.57–1.00)
Glucose: 80 mg/dL (ref 70–99)
Potassium: 5 mmol/L (ref 3.5–5.2)
Sodium: 138 mmol/L (ref 134–144)
eGFR: 62 mL/min/{1.73_m2} (ref >59)

## 2022-01-04 NOTE — Telephone Encounter (Signed)
Phone call placed to patient today to go over lab results.  Mobile phone number has a recording that phone call cannot be completed as dialed.  I dialed the home phone number and there was a recording with a female's voice.  I left a message stating that I was calling Ms. Brittian to go over lab results and please give [place] a call on Monday.

## 2022-01-08 NOTE — Telephone Encounter (Signed)
Phone number not in service

## 2022-01-16 ENCOUNTER — Telehealth: Payer: Self-pay | Admitting: Emergency Medicine

## 2022-01-16 NOTE — Telephone Encounter (Signed)
Copied from CRM [id number]. Topic: General - Other >> Jan 16, 2022 10:03 AM Macon Large wrote: Reason for CRM: Pt stated she received a letter to call the office for lab results. Attempted to transfer pt to the office but there was no answer. Pt requests call back at [phone]

## 2022-01-17 NOTE — Telephone Encounter (Signed)
Left message on voicemail to return call.

## 2022-01-20 ENCOUNTER — Telehealth: Payer: Self-pay | Admitting: *Deleted

## 2022-01-20 NOTE — Telephone Encounter (Signed)
Patient returned call and notified:  Let patient know that her kidney function has significantly improved.  Calcium level is still elevated.  Please return to the lab at her earliest convenience for additional testing.  Appointment scheduled

## 2022-01-21 ENCOUNTER — Other Ambulatory Visit: Payer: Self-pay | Admitting: Internal Medicine

## 2022-01-21 ENCOUNTER — Ambulatory Visit: Payer: Medicare Other | Attending: Internal Medicine

## 2022-01-21 DIAGNOSIS — D649 Anemia, unspecified: Secondary | ICD-10-CM

## 2022-01-21 DIAGNOSIS — I10 Essential (primary) hypertension: Secondary | ICD-10-CM

## 2022-01-24 ENCOUNTER — Telehealth: Payer: Self-pay | Admitting: Emergency Medicine

## 2022-01-24 NOTE — Telephone Encounter (Signed)
Copied from CRM [id number]. Topic: Referral - Request for Referral >> Jan 24, 2022 11:15 AM Everette C wrote: Has patient seen PCP for this complaint? Yes.    *If NO, is insurance requiring patient see PCP for this issue before PCP can refer them?  Referral for which specialty: Home Health Care Services   Preferred provider/office: [facility]   Reason for referral: In home care

## 2022-01-27 ENCOUNTER — Other Ambulatory Visit: Payer: Self-pay | Admitting: Internal Medicine

## 2022-01-27 DIAGNOSIS — F17200 Nicotine dependence, unspecified, uncomplicated: Secondary | ICD-10-CM

## 2022-01-29 ENCOUNTER — Telehealth: Payer: Self-pay | Admitting: Internal Medicine

## 2022-01-29 NOTE — Telephone Encounter (Signed)
Phone call placed to patient this morning to go over lab results.  Current cell phone number listed as [phone] is not in service at this time.  Home phone number listed at [phone] has a recording on it with a female voice stating that he is unable to take the call at this time and to leave a message.  I did not leave a message.  We will have the CMA try to reach her with lab results.

## 2022-01-30 ENCOUNTER — Telehealth: Payer: Self-pay | Admitting: Emergency Medicine

## 2022-01-30 LAB — PTH, INTACT AND CALCIUM
Calcium: 10.1 mg/dL (ref 8.7–10.3)
PTH: 59 pg/mL (ref 15–65)

## 2022-01-30 LAB — CBC
Hematocrit: 29.1 % — ABNORMAL LOW (ref 34.0–46.6)
Hemoglobin: 9.6 g/dL — ABNORMAL LOW (ref 11.1–15.9)
MCH: 31.9 pg (ref 26.6–33.0)
MCHC: 33 g/dL (ref 31.5–35.7)
MCV: 97 fL (ref 79–97)
Platelets: 239 10*3/uL (ref 150–450)
RBC: 3.01 x10E6/uL — ABNORMAL LOW (ref 3.77–5.28)
RDW: 12.8 % (ref 11.7–15.4)
WBC: 4 10*3/uL (ref 3.4–10.8)

## 2022-01-30 LAB — VITAMIN D 1,25 DIHYDROXY
Vitamin D 1, 25 (OH)2 Total: 56 pg/mL
Vitamin D2 1, 25 (OH)2: <10 pg/mL
Vitamin D3 1, 25 (OH)2: 55 pg/mL

## 2022-01-30 LAB — TSH+T4F+T3FREE
Free T4: 1.21 ng/dL (ref 0.82–1.77)
T3, Free: 2.4 pg/mL (ref 2.0–4.4)
TSH: 0.746 u[IU]/mL (ref 0.450–4.500)

## 2022-01-30 LAB — PTH-RELATED PEPTIDE: PTH-related peptide: <2 pmol/L

## 2022-01-30 NOTE — Telephone Encounter (Signed)
Patient was called and a VM was left informing patient that she will need to call the office to schedule a telephone appointment to discuss.

## 2022-01-30 NOTE — Telephone Encounter (Signed)
Copied from CRM [id number]. Topic: General - Other >> Jan 30, 2022  1:41 PM Ja-Kwan M wrote: Reason for CRM: Pt stated she needs paperwork to be sent to [facility] for a home health nurse to come out to her home.

## 2022-01-30 NOTE — Telephone Encounter (Signed)
Copied from CRM [id number]. Topic: General - Inquiry >> Jan 30, 2022  1:38 PM Ja-Kwan M wrote: Reason for CRM: Pt called to requests most recent lab results. Cb# [phone]

## 2022-02-03 ENCOUNTER — Telehealth: Payer: Self-pay | Admitting: Emergency Medicine

## 2022-02-03 NOTE — Telephone Encounter (Signed)
Copied from CRM [id number]. Topic: General - Other >> Feb 03, 2022 11:05 AM Lyman Speller wrote: Reason for CRM: Pt would like a call to discuss lab results / please advise

## 2022-02-05 NOTE — Telephone Encounter (Signed)
Call returned to patient # [phone] and the recording stated that the number is not in service. I also called #  [phone] and Lorrene Reid, her brother in law answered and said she was not there and he would have her return my call.  He has the phone number for [facility]

## 2022-02-06 NOTE — Telephone Encounter (Signed)
Attempted to contact patient again.  I called  [phone] and the recording stated that the number is not in service. I also called #  [phone] and left message requesting the patient return my call.

## 2022-02-07 ENCOUNTER — Ambulatory Visit: Payer: Self-pay | Admitting: *Deleted

## 2022-02-07 ENCOUNTER — Telehealth: Payer: Self-pay

## 2022-02-07 NOTE — Telephone Encounter (Signed)
Pt was not on the line when agent transferred her to me.    I attempted to call her back from the number she called in with [phone] and got a recording that the number I called could not be completed as dialed.   Please try your call again later.    I see in the chart where the practice has been trying to contact her also and the number in the chart was not working so they sent her a letter.    I forwarded this information to [facility] [facility].

## 2022-02-07 NOTE — Telephone Encounter (Signed)
Patient returned our call for lab results. Shared provider note with pt. Pt will purchased iron supplement and begin taking.  Marcine Matar, MD  01/29/2022 10:23 AM EDT     Patient know that she is still anemic and blood cell count slightly worse compared to back in December.  I recommend purchasing iron supplement over-the-counter and taking 1 tablet 3 times a week.  Parathyroid hormone level is normal.  We have checked this level due to the elevated calcium.  Thyroid hormone levels are normal.

## 2022-02-11 NOTE — Telephone Encounter (Signed)
Pt has been informed of results

## 2022-02-12 NOTE — Telephone Encounter (Signed)
I attempted again to contact the patient # [phone] and the recording stated that the number is not in service. I also called #  [phone] and left message requesting the patient return my call.

## 2022-02-27 NOTE — Telephone Encounter (Signed)
I was able to reach the patient on her home phone and updated her mobile phone number. She said she needs help with personal care because of her arthritis. I explained that we can place a referral to [employer] for [employer]. They will have a nurse come to her home to assess her and determine if she is eligible for services and if so, how many hours she will receive.  She is then able to choose the agency she wishes to provide the care

## 2022-03-02 ENCOUNTER — Emergency Department (HOSPITAL_COMMUNITY): Payer: Medicare Other

## 2022-03-02 ENCOUNTER — Emergency Department (HOSPITAL_COMMUNITY)
Admission: EM | Admit: 2022-03-02 | Discharge: 2022-03-03 | Disposition: A | Discharge status: Home / Self Care | Payer: Medicare Other | Attending: Emergency Medicine | Admitting: Emergency Medicine

## 2022-03-02 ENCOUNTER — Other Ambulatory Visit: Payer: Self-pay

## 2022-03-02 ENCOUNTER — Encounter (HOSPITAL_COMMUNITY): Payer: Self-pay | Admitting: Emergency Medicine

## 2022-03-02 DIAGNOSIS — I10 Essential (primary) hypertension: Secondary | ICD-10-CM | POA: Insufficient documentation

## 2022-03-02 DIAGNOSIS — R0602 Shortness of breath: Secondary | ICD-10-CM | POA: Insufficient documentation

## 2022-03-02 DIAGNOSIS — J449 Chronic obstructive pulmonary disease, unspecified: Secondary | ICD-10-CM | POA: Insufficient documentation

## 2022-03-02 DIAGNOSIS — R079 Chest pain, unspecified: Secondary | ICD-10-CM

## 2022-03-02 DIAGNOSIS — R0789 Other chest pain: Secondary | ICD-10-CM | POA: Diagnosis not present

## 2022-03-02 DIAGNOSIS — Z79899 Other long term (current) drug therapy: Secondary | ICD-10-CM | POA: Diagnosis not present

## 2022-03-02 DIAGNOSIS — R059 Cough, unspecified: Secondary | ICD-10-CM | POA: Diagnosis not present

## 2022-03-02 LAB — COMPREHENSIVE METABOLIC PANEL
ALT: 25 U/L (ref 0–44)
AST: 27 U/L (ref 15–41)
Albumin: 4 g/dL (ref 3.5–5.0)
Alkaline Phosphatase: 115 U/L (ref 38–126)
Anion gap: 6 (ref 5–15)
BUN: 22 mg/dL (ref 8–23)
CO2: 23 mmol/L (ref 22–32)
Calcium: 10.3 mg/dL (ref 8.9–10.3)
Chloride: 110 mmol/L (ref 98–111)
Creatinine, Ser: 1.34 mg/dL — ABNORMAL HIGH (ref 0.44–1.00)
GFR, Estimated: 43 mL/min — ABNORMAL LOW (ref >60)
Glucose, Bld: 107 mg/dL — ABNORMAL HIGH (ref 70–99)
Potassium: 4.4 mmol/L (ref 3.5–5.1)
Sodium: 139 mmol/L (ref 135–145)
Total Bilirubin: 0.3 mg/dL (ref 0.3–1.2)
Total Protein: 6.5 g/dL (ref 6.5–8.1)

## 2022-03-02 LAB — CBC WITH DIFFERENTIAL/PLATELET
Abs Immature Granulocytes: 0.01 10*3/uL (ref 0.00–0.07)
Basophils Absolute: 0.1 10*3/uL (ref 0.0–0.1)
Basophils Relative: 1 %
Eosinophils Absolute: 0.2 10*3/uL (ref 0.0–0.5)
Eosinophils Relative: 4 %
HCT: 30.5 % — ABNORMAL LOW (ref 36.0–46.0)
Hemoglobin: 9.9 g/dL — ABNORMAL LOW (ref 12.0–15.0)
Immature Granulocytes: 0 %
Lymphocytes Relative: 29 %
Lymphs Abs: 1.4 10*3/uL (ref 0.7–4.0)
MCH: 32.8 pg (ref 26.0–34.0)
MCHC: 32.5 g/dL (ref 30.0–36.0)
MCV: 101 fL — ABNORMAL HIGH (ref 80.0–100.0)
Monocytes Absolute: 0.6 10*3/uL (ref 0.1–1.0)
Monocytes Relative: 12 %
Neutro Abs: 2.5 10*3/uL (ref 1.7–7.7)
Neutrophils Relative %: 54 %
Platelets: 255 10*3/uL (ref 150–400)
RBC: 3.02 MIL/uL — ABNORMAL LOW (ref 3.87–5.11)
RDW: 13 % (ref 11.5–15.5)
WBC: 4.7 10*3/uL (ref 4.0–10.5)
nRBC: 0 % (ref 0.0–0.2)

## 2022-03-02 LAB — TROPONIN I (HIGH SENSITIVITY): Troponin I (High Sensitivity): 3 ng/L (ref <18)

## 2022-03-02 NOTE — ED Provider Triage Note (Signed)
Emergency Medicine Provider Triage Evaluation Note  Anna Mcguire , a 66 y.o. female  was evaluated in triage.  Pt complains of chest pain. States this began late last night. Describes it as a central, non radiating pressure. She had associated pain in her right shoulder. She endorses shortness of breath (history of copd) and cough but denies phlegm or fever. Denies palpitations.  Review of Systems  Positive:  Negative: See above  Physical Exam  BP (!) 143/73 (BP Location: Right Arm)   Pulse 81   Temp 98.7 F (37.1 C) (Oral)   Resp 18   Ht 5\' 7"  (1.702 m)   Wt 54.4 kg   SpO2 99%   BMI 18.79 kg/m  Gen:   Awake, no distress   Resp:  Normal effort  MSK:   Moves extremities without difficulty  Other:  S1/s2 without murmur  Medical Decision Making  Medically screening exam initiated at 3:32 PM.  Appropriate orders placed.  was informed that the remainder of the evaluation will be completed by another provider, this initial triage assessment does not replace that evaluation, and the importance of remaining in the ED until their evaluation is complete.     Marcy Salvo, PA-C 03/02/22 1535

## 2022-03-02 NOTE — ED Triage Notes (Addendum)
Per ems, pt from home c/o central chest pain and weakness/dizziness since yesterday. Hx of COPD and HTN. EMS VSS. Pt took Gabapentin before ems arrival, pt is not prescribed for that medication. A&Ox4.

## 2022-03-03 DIAGNOSIS — R0789 Other chest pain: Secondary | ICD-10-CM | POA: Diagnosis not present

## 2022-03-03 LAB — TROPONIN I (HIGH SENSITIVITY): Troponin I (High Sensitivity): 4 ng/L (ref <18)

## 2022-03-03 NOTE — ED Notes (Signed)
Discharge instruction reviewed and patient verbalize understanding. Vital signs stable and ambulatory with steady gait.

## 2022-03-03 NOTE — ED Provider Notes (Signed)
[facility] EMERGENCY DEPARTMENT Provider Note   CSN: [id number] Arrival date & time: 03/02/22  1511     History  Chief Complaint  Patient presents with   Chest Pain    Anna Mcguire is a 66 y.o. female.   Chest Pain Patient presents with chest pain and shortness of breath.  Began last night although she has been in the ER for 17-1/2 hours before I saw her so that is now actually 2 nights ago.  Was a slight pressure.  Had a cough.  Some right shoulder pain.  States she is cough with no real changes sputum production.  History of COPD.  States she thinks that her albuterol made her dizzy when she woke up.  Also had taken a Neurontin which she was not prescribed for some back pain.      Past Medical History:  Diagnosis Date   Acid reflux    Arthritis    Hypertension     Home Medications Prior to Admission medications   Medication Sig Start Date End Date Taking? Authorizing Provider  albuterol (PROVENTIL) (2.5 MG/3ML) 0.083% nebulizer solution inhale THE contents of 1 vial PER nebulizer EVERY 6 HOURS AS NEEDED FOR wheezing OR SHORTNESS OF BREATH 12/31/21   Marcine Matar, MD  albuterol (VENTOLIN HFA) 108 (90 Base) MCG/ACT inhaler Inhale 2 puffs into the lungs every 6 (six) hours as needed for wheezing or shortness of breath. 12/31/21   Marcine Matar, MD  amLODipine (NORVASC) 10 MG tablet TAKE ONE TABLET BY MOUTH DAILY FOR blood pressure 12/31/21   Marcine Matar, MD  atorvastatin (LIPITOR) 80 MG tablet Take 1 tablet (80 mg total) by mouth daily. 12/31/21   Marcine Matar, MD  diclofenac Sodium (VOLTAREN) 1 % GEL Apply 2 g topically 4 (four) times daily. 01/03/22   Marcine Matar, MD  irbesartan (AVAPRO) 75 MG tablet Take 1 tablet (75 mg total) by mouth daily. 01/22/22   Marcine Matar, MD  loratadine (CLARITIN) 10 MG tablet Take 1 tablet (10 mg total) by mouth daily. Patient not taking: Reported on 12/31/2021 03/19/21   Marcine Matar, MD  nicotine (NICODERM CQ - DOSED IN MG/24 HOURS) 21 mg/24hr patch Place 1 patch (21 mg total) onto the skin daily. 12/31/21   Marcine Matar, MD  pantoprazole (PROTONIX) 40 MG tablet TAKE ONE TABLET BY MOUTH EVERY DAY Patient not taking: Reported on 12/31/2021 11/13/21   Marcine Matar, MD  tiotropium [facility] HANDIHALER) 18 MCG inhalation capsule place ONE CAPSULE into inhaler AND inhale daily 12/31/21   Marcine Matar, MD  fluticasone [facility]) 50 MCG/ACT nasal spray Place 1 spray into both nostrils daily.  05/12/19  [provider]  lisinopril-hydrochlorothiazide (PRINZIDE,ZESTORETIC) 20-12.5 MG per tablet Take 1 tablet by mouth daily.  05/12/19  [provider]  lovastatin (MEVACOR) 20 MG tablet Take 20 mg by mouth daily at 6 PM.  05/12/19  [provider]      Allergies    Chantix [varenicline tartrate], Penicillins, and Wellbutrin [bupropion]    Review of Systems   Review of Systems  Cardiovascular:  Positive for chest pain.    Physical Exam Updated Vital Signs BP (!) 141/78   Pulse 68   Temp 98.4 F (36.9 C) (Oral)   Resp 14   Ht 5\' 7"  (1.702 m)   Wt 54.4 kg   SpO2 100%   BMI 18.79 kg/m  Physical Exam Vitals and nursing note reviewed.  Cardiovascular:     Rate and Rhythm: Normal rate and regular rhythm.  Pulmonary:     Comments: Harsh breath sounds without focal rales or rhonchi. Chest:     Chest wall: No tenderness.  Abdominal:     Tenderness: There is no abdominal tenderness.  Musculoskeletal:     Right lower leg: No edema.     Left lower leg: No edema.  Neurological:     Mental Status: She is alert.     ED Results / Procedures / Treatments   Labs (all labs ordered are listed, but only abnormal results are displayed) Labs Reviewed  COMPREHENSIVE METABOLIC PANEL - Abnormal; Notable for the following components:      Result Value   Glucose, Bld 107 (*)    Creatinine, Ser 1.34 (*)    GFR, Estimated 43 (*)     All other components within normal limits  CBC WITH DIFFERENTIAL/PLATELET - Abnormal; Notable for the following components:   RBC 3.02 (*)    Hemoglobin 9.9 (*)    HCT 30.5 (*)    MCV 101.0 (*)    All other components within normal limits  TROPONIN I (HIGH SENSITIVITY)  TROPONIN I (HIGH SENSITIVITY)    EKG EKG Interpretation  Date/Time:  Sunday March 02 2022 15:18:52 EDT Ventricular Rate:  81 PR Interval:  178 QRS Duration: 82 QT Interval:  370 QTC Calculation: 429 R Axis:   31 Text Interpretation: Normal sinus rhythm Anterior infarct , age undetermined Abnormal ECG Confirmed by Benjiman Core [phone]) on 03/03/2022 8:10:40 AM  Radiology DG Chest 2 View  Result Date: 03/02/2022 CLINICAL DATA:  Chest pain and shortness of breath EXAM: CHEST - 2 VIEW COMPARISON:  Chest x-ray 01/12/2020 FINDINGS: The heart size and mediastinal contours are within normal limits. Both lungs are clear. The visualized skeletal structures are unremarkable. IMPRESSION: No active cardiopulmonary disease. Electronically Signed   By: Darliss Cheney M.D.   On: 03/02/2022 16:19    Procedures Procedures    Medications Ordered in ED Medications - No data to display  ED Course/ Medical Decision Making/ A&P                           Medical Decision Making  Patient with chest pain.  Had occurred 2 nights ago.  No fevers.  EKG reassuring.  Troponin negative x2.  Pain-free now.  Has not had any exertional pain.  Had a cough.  States she felt a little dizzy with it.  However this also began after she took the medicine she was not prescribed.  Differential diagnosis does include cardiac chest pain, arrhythmia, nonspecific chest pain. I think with 2 negative troponins she is low risk.  Had not been exertional.  More of a dizziness and just slight achiness that is resolved.  Appears stable for outpatient follow-up.  Will discharge home.        Final Clinical Impression(s) / ED Diagnoses Final diagnoses:   Nonspecific chest pain    Rx / DC Orders ED Discharge Orders     None         Benjiman Core, MD 03/03/22 [phone]

## 2022-03-11 ENCOUNTER — Telehealth: Payer: Self-pay

## 2022-03-11 NOTE — Telephone Encounter (Signed)
PCS referral faxed to [facility] 

## 2022-03-19 ENCOUNTER — Telehealth: Payer: Self-pay

## 2022-03-19 NOTE — Telephone Encounter (Signed)
I spoke to [employer] who confirmed receipt of the [facility] referral and stated that an assessment needs to be scheduled.

## 2022-03-20 ENCOUNTER — Ambulatory Visit: Payer: Medicare Other | Admitting: Internal Medicine

## 2022-03-21 ENCOUNTER — Other Ambulatory Visit: Payer: Self-pay | Admitting: Internal Medicine

## 2022-03-26 ENCOUNTER — Other Ambulatory Visit: Payer: Self-pay | Admitting: Internal Medicine

## 2022-03-26 DIAGNOSIS — K219 Gastro-esophageal reflux disease without esophagitis: Secondary | ICD-10-CM

## 2022-04-03 ENCOUNTER — Ambulatory Visit: Payer: Medicare Other | Attending: Internal Medicine | Admitting: Internal Medicine

## 2022-04-03 VITALS — BP 166/78 | HR 68 | Ht 66.5 in | Wt 118.2 lb

## 2022-04-03 DIAGNOSIS — D638 Anemia in other chronic diseases classified elsewhere: Secondary | ICD-10-CM | POA: Diagnosis not present

## 2022-04-03 DIAGNOSIS — J069 Acute upper respiratory infection, unspecified: Secondary | ICD-10-CM | POA: Diagnosis not present

## 2022-04-03 DIAGNOSIS — I10 Essential (primary) hypertension: Secondary | ICD-10-CM

## 2022-04-03 DIAGNOSIS — F17200 Nicotine dependence, unspecified, uncomplicated: Secondary | ICD-10-CM | POA: Diagnosis not present

## 2022-04-03 DIAGNOSIS — L299 Pruritus, unspecified: Secondary | ICD-10-CM

## 2022-04-03 DIAGNOSIS — J449 Chronic obstructive pulmonary disease, unspecified: Secondary | ICD-10-CM | POA: Diagnosis not present

## 2022-04-03 DIAGNOSIS — F1020 Alcohol dependence, uncomplicated: Secondary | ICD-10-CM

## 2022-04-03 DIAGNOSIS — R269 Unspecified abnormalities of gait and mobility: Secondary | ICD-10-CM | POA: Diagnosis not present

## 2022-04-03 DIAGNOSIS — M1712 Unilateral primary osteoarthritis, left knee: Secondary | ICD-10-CM | POA: Diagnosis not present

## 2022-04-03 DIAGNOSIS — Z23 Encounter for immunization: Secondary | ICD-10-CM | POA: Diagnosis not present

## 2022-04-03 DIAGNOSIS — F320 Major depressive disorder, single episode, mild: Secondary | ICD-10-CM

## 2022-04-03 MED ORDER — BUDESONIDE-FORMOTEROL FUMARATE 80-4.5 MCG/ACT IN AERO: 2.0000 | INHALATION_SPRAY | Freq: Two times a day (BID) | RESPIRATORY_TRACT | 12 refills | Status: DC

## 2022-04-03 MED ORDER — HYDRALAZINE HCL 10 MG PO TABS: 10.0000 mg | ORAL_TABLET | Freq: Two times a day (BID) | ORAL | 5 refills | Status: DC

## 2022-04-03 MED ORDER — TRIAMCINOLONE ACETONIDE 0.1 % EX CREA: 1.0000 | TOPICAL_CREAM | Freq: Two times a day (BID) | CUTANEOUS | 0 refills | Status: DC

## 2022-04-03 MED ORDER — TIOTROPIUM BROMIDE MONOHYDRATE 18 MCG IN CAPS
ORAL_CAPSULE | RESPIRATORY_TRACT | 6 refills | Status: DC
Start: 2022-04-03 — End: 2022-08-05

## 2022-04-03 NOTE — Progress Notes (Signed)
Patient ID: Anna Mcguire, female    DOB: 07/26/1955  MRN: [id number]  CC: chronic ds management Subjective: Anna Mcguire is a 66 y.o. female who presents for chronic ds management Her concerns today include:  Patient with history of HTN, HL, tob dep, COPD, OA left knee, EtOH abuse, chronic diarrhea, SIADH, ACD with macrocytosis, CKD 3, Hypercalcemia likely due to primary hyperparathyroid   C/o intermittent itching over mons pubis x 3 mths. No itching in or around vagina. No vaginal dischg.  No rash seen.  Active with one female partner, he is not complaining of any pubic itching.  Also complains of itching on the right buttock laterally.  No rash.  No recent change in body products.  C/o having cold since weather change x 2 wks.  Now more cool weather. Symptoms of sore throat, chest congest, little cough productive of green phlegm.  Endorses SOB if she moves around a lot.  Hx of COPD; SOB worse since having cold.  Taking Nyquil which helps -Using Spiriva BID instead of once a day as prescribed.  Using Albuterol inh 3-4x/day even before having cold.   -request rxn for neb machine.  She has 1 that she has had for many years and it no longer works. Still smoking 5 cigarettes a day.  Rxn given for patches on last visit.  Never filled rxn.  On last visit she told me did not tolerate Chantix or Wellbutrin.  Today she tells me she made a mistake in telling me that.  She did not tolerate Chantix or nicotine lozenges but tolerated bupropion.  Has bottle of Buprorion SR 100 mg with her and states she is taking and finding helpful and helps with depression.  OA:  requesting rxn for cane.  Has 4 prong metal cane with her today.  Reports too heavy and constantly kicks it with RT foot..Given rxn for rollator walker on last visit but states she was not able to get to a medical supply store to have the prescription filled. Left knee is most bothersome.  She has not had any further falls since last visit  with me.  HTN:  BP elev.  Reports compliance with Amlodipine 10 mg and Ibresartan 75 mg.  Took meds already this a.m.  No device to check BP.  Not limiting salt as much as she should  ETOH Use: has cut back.  Can go 2-3 days without drinking.  Drinks two 40 oz a day on days when she does drink.    Macrocytic anemia:  H/H stable  9s/29-30s.  Based on CBC from last visit, I advised that she take iron supplement several days a week.  Iron studies done back in December were more consistent with anemia of chronic disease.  Previous B12 and folate levels were normal. Taking Iron supplement OTC 65 mg once a day.  She has a bottle with her.   Patient Active Problem List   Diagnosis Date Noted   Hypercalcemia 12/31/2021   Stage 3b chronic kidney disease ([place]) 12/31/2021   Allergic rhinitis 10/24/2020   Rotator cuff arthropathy of left shoulder 08/07/2020   Cervicalgia 08/07/2020   Chronic cough 03/21/2020   Alcohol use disorder, moderate, dependence ([place]) 01/30/2020   SIADH (syndrome of inappropriate ADH production) ([place]) 01/30/2020   Tobacco dependence 01/30/2020   Diarrhea of infectious origin 01/30/2020   Hyponatremia 01/13/2020   Alcohol abuse 01/13/2020   Tobacco abuse 01/13/2020   Chronic diarrhea 01/13/2020   Hypophosphatemia 01/13/2020  Hypertension    Chronic obstructive pulmonary disease (HCC)    Alcohol use    Hypokalemia 01/12/2020   Tobacco use 10/06/2019   Primary osteoarthritis of left knee 09/08/2019   Pain in right shoulder 08/11/2019     Current Outpatient Medications on File Prior to Visit  Medication Sig Dispense Refill   albuterol (PROVENTIL) (2.5 MG/3ML) 0.083% nebulizer solution inhale THE contents of 1 vial PER nebulizer EVERY 6 HOURS AS NEEDED FOR wheezing OR SHORTNESS OF BREATH 150 mL 5   amLODipine (NORVASC) 10 MG tablet TAKE ONE TABLET BY MOUTH DAILY FOR blood pressure 90 tablet 1   diclofenac Sodium (VOLTAREN) 1 % GEL APPLICATORFUL 2gm topically FOUR TIMES  DAILY 100 g 0   irbesartan (AVAPRO) 75 MG tablet Take 1 tablet (75 mg total) by mouth daily. 90 tablet 0   nicotine (NICODERM CQ - DOSED IN MG/24 HOURS) 21 mg/24hr patch Place 1 patch (21 mg total) onto the skin daily. 28 patch 2   pantoprazole (PROTONIX) 40 MG tablet TAKE ONE TABLET BY MOUTH EVERY DAY 90 tablet 0   tiotropium (SPIRIVA HANDIHALER) 18 MCG inhalation capsule place ONE CAPSULE into inhaler AND inhale daily 30 capsule 6   albuterol (VENTOLIN HFA) 108 (90 Base) MCG/ACT inhaler Inhale 2 puffs into the lungs every 6 (six) hours as needed for wheezing or shortness of breath. 8.5 g 6   atorvastatin (LIPITOR) 80 MG tablet Take 1 tablet (80 mg total) by mouth daily. (Patient not taking: Reported on 04/03/2022) 90 tablet 1   loratadine (CLARITIN) 10 MG tablet Take 1 tablet (10 mg total) by mouth daily. (Patient not taking: Reported on 12/31/2021) 90 tablet 2   [DISCONTINUED] fluticasone (FLONASE) 50 MCG/ACT nasal spray Place 1 spray into both nostrils daily.     [DISCONTINUED] lisinopril-hydrochlorothiazide (PRINZIDE,ZESTORETIC) 20-12.5 MG per tablet Take 1 tablet by mouth daily.     [DISCONTINUED] lovastatin (MEVACOR) 20 MG tablet Take 20 mg by mouth daily at 6 PM.     No current facility-administered medications on file prior to visit.    Allergies  Allergen Reactions   Chantix [Varenicline Tartrate]     Diarrhea and dizziness   Penicillins Diarrhea   Wellbutrin [Bupropion] Other (See Comments)    Diarrhea and dizziness    Social History   Socioeconomic History   Marital status: Single    Spouse name: Not on file   Number of children: Not on file   Years of education: Not on file   Highest education level: Not on file  Occupational History   Not on file  Tobacco Use   Smoking status: Every Day    Packs/day: 0.50    Years: 48.00    Total pack years: 24.00    Types: Cigarettes   Smokeless tobacco: Never   Tobacco comments:    3 times a week - 10/24/2020  Substance and  Sexual Activity   Alcohol use: Yes    Comment: occ   Drug use: Yes    Types: Marijuana   Sexual activity: Not Currently  Other Topics Concern   Not on file  Social History Narrative   Not on file   Social Determinants of Health   Financial Resource Strain: Not on file  Food Insecurity: Not on file  Transportation Needs: Not on file  Physical Activity: Not on file  Stress: Not on file  Social Connections: Not on file  Intimate Partner Violence: Not on file    Family History  Problem Relation Age  of Onset   Kidney failure Mother    Aneurysm Father     Past Surgical History:  Procedure Laterality Date   COLONOSCOPY WITH PROPOFOL N/A 06/07/2014   Procedure: COLONOSCOPY WITH PROPOFOL;  Surgeon: Arta Silence, MD;  Location: [facility]COPY;  Service: Endoscopy;  Laterality: N/A;   ESOPHAGOGASTRODUODENOSCOPY (EGD) WITH PROPOFOL N/A 06/07/2014   Procedure: ESOPHAGOGASTRODUODENOSCOPY (EGD) WITH PROPOFOL;  Surgeon: Arta Silence, MD;  Location: [facility] ENDOSCOPY;  Service: Endoscopy;  Laterality: N/A;    ROS: Review of Systems Negative except as stated above  PHYSICAL EXAM: BP (!) 166/78   Pulse 68   Ht 5' 6.5" (1.689 m)   Wt 118 lb 3.2 oz (53.6 kg)   SpO2 100%   BMI 18.79 kg/m   Wt Readings from Last 3 Encounters:  04/03/22 118 lb 3.2 oz (53.6 kg)  03/02/22 120 lb (54.4 kg)  12/31/21 114 lb (51.7 kg)    Physical Exam  General appearance -older African-American female in NAD. Mental status - normal mood, behavior, speech, dress, motor activity, and thought processes Mouth - mucous membranes moist, pharynx normal without lesions Neck - supple, no significant adenopathy Chest -breath sounds are clear bilaterally.  No wheezes, crackles or rhonchi is heard. Heart -regular rate and rhythm.  Soft systolic ejection murmur heard along the left sternal border. Musculoskeletal -she ambulates with a metal 4-prong cane.  Gait is clumsy with this.  Gait is a little unstable with low  foot to floor clearance and she is knocked knee on the left leg.  Left knee joint is enlarged. Extremities -trace edema in the left lower extremity. Skin -no rash seen on the mons pubis.  No erythema noted.  No rash seen on the right buttock.  Skin appears dry.     04/03/2022   10:34 AM 06/25/2021    1:58 PM 05/28/2021    2:24 PM  Depression screen PHQ 2/9  Decreased Interest 2 2 2   Down, Depressed, Hopeless 2 2 2   PHQ - 2 Score 4 4 4   Altered sleeping 2 2 3   Tired, decreased energy 2 2 1   Change in appetite 2 2 2   Feeling bad or failure about yourself  2 2 2   Trouble concentrating 0 2 2  Moving slowly or fidgety/restless 0 0 2  Suicidal thoughts 0 0 0  PHQ-9 Score 12 14 16        Latest Ref Rng & Units 03/02/2022    3:37 PM 01/21/2022   10:31 AM 01/03/2022    2:59 PM  CMP  Glucose 70 - 99 mg/dL 107   80   BUN 8 - 23 mg/dL 22   25   Creatinine 0.44 - 1.00 mg/dL 1.34   0.99   Sodium 135 - 145 mmol/L 139   138   Potassium 3.5 - 5.1 mmol/L 4.4   5.0   Chloride 98 - 111 mmol/L 110   105   CO2 22 - 32 mmol/L 23   22   Calcium 8.9 - 10.3 mg/dL 10.3  10.1  11.3   Total Protein 6.5 - 8.1 g/dL 6.5     Total Bilirubin 0.3 - 1.2 mg/dL 0.3     Alkaline Phos 38 - 126 U/L 115     AST 15 - 41 U/L 27     ALT 0 - 44 U/L 25      Lipid Panel     Component Value Date/Time   CHOL 155 03/20/2021 1454   TRIG 258 (H) 03/20/2021  1454   HDL 82 03/20/2021 1454   CHOLHDL 1.9 03/20/2021 1454   CHOLHDL 1.5 01/15/2020 0258   VLDL 23 01/15/2020 0258   LDLCALC 35 03/20/2021 1454    CBC    Component Value Date/Time   WBC 4.7 03/02/2022 1537   RBC 3.02 (L) 03/02/2022 1537   HGB 9.9 (L) 03/02/2022 1537   HGB 9.6 (L) 01/21/2022 1031   HCT 30.5 (L) 03/02/2022 1537   HCT 29.1 (L) 01/21/2022 1031   PLT 255 03/02/2022 1537   PLT 239 01/21/2022 1031   MCV 101.0 (H) 03/02/2022 1537   MCV 97 01/21/2022 1031   MCH 32.8 03/02/2022 1537   MCHC 32.5 03/02/2022 1537   RDW 13.0 03/02/2022 1537   RDW  12.8 01/21/2022 1031   LYMPHSABS 1.4 03/02/2022 1537   MONOABS 0.6 03/02/2022 1537   EOSABS 0.2 03/02/2022 1537   BASOSABS 0.1 03/02/2022 1537    ASSESSMENT AND PLAN: 1. Chronic obstructive pulmonary disease, unspecified COPD type ([place]) Advised patient that the Spiriva should be taken once a day not twice a day. Add Symbicort to help with better control. We will send prescription to [place] for nebulizer machine. - budesonide-formoterol (SYMBICORT) 80-4.5 MCG/ACT inhaler; Inhale 2 puffs into the lungs in the morning and at bedtime.  Dispense: 1 each; Refill: 12 - tiotropium (SPIRIVA HANDIHALER) 18 MCG inhalation capsule; place ONE CAPSULE into inhaler AND inhale daily  Dispense: 30 capsule; Refill: 6  2. Viral URI Advise use of Robitussin cough syrup over-the-counter as needed. I did not add prednisone at this time as no wheezing heard on exam and lungs sounds were  clear.  3. Essential hypertension Not at goal.  Continue Norvasc 10 mg daily and irbesartan 75 mg daily. Add hydralazine 10 mg twice a day.  Follow-up with clinical pharmacist for recheck in several weeks. - hydrALAZINE (APRESOLINE) 10 MG tablet; Take 1 tablet (10 mg total) by mouth 2 (two) times daily.  Dispense: 60 tablet; Refill: 5  4. Alcohol use disorder, moderate, dependence ([place]) Continue to encourage her to cut back more.  Advised not to drink more than one 12 oz beer a day.  Discussed health risks associated with excess alcohol use.  Even though she is not drinking daily, she is binge drinking several days a week.  5. Anemia, chronic disease Patient with macrocytosis on CBC but last iron studies consistent with anemia of chronic disease.  Advised to cut back on iron supplement to 3 days a week.  We will recheck iron studies today. - Iron, TIBC and Ferritin Panel  6. Tobacco dependence Continue to encourage her to quit.  She is currently taking bupropion and reports that it helps.  7. Primary osteoarthritis  of left knee Prescription will be sent to [facility] for regular cane and the rollator walker.  She is agreeable to seeing orthopedics. - Ambulatory referral to Orthopedic Surgery - For home use only DME Other see comment  8. Gait disturbance - For home use only DME Other see comment  9. Itching - triamcinolone cream (KENALOG) 0.1 %; Apply 1 Application topically 2 (two) times daily.  Dispense: 30 g; Refill: 0  10. Need for immunization against influenza - Flu Vaccine QUAD 32mo+IM (Fluarix, Fluzone & Alfiuria Quad PF)  11.  Mild depression Patient on bupropion which is helping to decrease craving for cigarettes.  She reports it is also helpful with depression.  Patient was given the opportunity to ask questions.  Patient verbalized understanding of the  plan and was able to repeat key elements of the plan.   This documentation was completed using [employer].  Any transcriptional errors are unintentional.  No orders of the defined types were placed in this encounter.    Requested Prescriptions    No prescriptions requested or ordered in this encounter    No follow-ups on file.  Karle Plumber, MD, FACP

## 2022-04-03 NOTE — Patient Instructions (Signed)
I recommend purchasing some Robitussin over-the-counter and using it as needed for the cough.  We have added another inhaler to better control your COPD.  It is called Symbicort.  You should take 2 puffs twice a day.  You should use your Spiriva inhaler once a day only.  Not twice a day. Prescription will be sent to a medical supply store for the nebulizer machine for you.  They will call you in regards to this.  Prescription will be sent to the medical supply store for the rollator walker and a standard cane for you.  Your blood pressure is not controlled.  We have added another blood pressure medication called Hydralazine 10 mg twice a day

## 2022-04-04 LAB — IRON,TIBC AND FERRITIN PANEL
Ferritin: 99 ng/mL (ref 15–150)
Iron Saturation: 30 % (ref 15–55)
Iron: 111 ug/dL (ref 27–139)
Total Iron Binding Capacity: 367 ug/dL (ref 250–450)
UIBC: 256 ug/dL (ref 118–369)

## 2022-04-09 ENCOUNTER — Ambulatory Visit: Payer: Medicare Other | Admitting: Orthopaedic Surgery

## 2022-04-10 ENCOUNTER — Ambulatory Visit: Payer: Medicare Other | Admitting: Orthopaedic Surgery

## 2022-04-23 ENCOUNTER — Encounter: Payer: Self-pay | Admitting: Orthopaedic Surgery

## 2022-04-23 ENCOUNTER — Ambulatory Visit (INDEPENDENT_AMBULATORY_CARE_PROVIDER_SITE_OTHER): Payer: Medicare Other

## 2022-04-23 ENCOUNTER — Ambulatory Visit (INDEPENDENT_AMBULATORY_CARE_PROVIDER_SITE_OTHER): Payer: Medicare Other | Admitting: Orthopaedic Surgery

## 2022-04-23 ENCOUNTER — Other Ambulatory Visit: Payer: Self-pay | Admitting: Internal Medicine

## 2022-04-23 DIAGNOSIS — M25512 Pain in left shoulder: Secondary | ICD-10-CM | POA: Insufficient documentation

## 2022-04-23 DIAGNOSIS — M79645 Pain in left finger(s): Secondary | ICD-10-CM

## 2022-04-23 DIAGNOSIS — M1812 Unilateral primary osteoarthritis of first carpometacarpal joint, left hand: Secondary | ICD-10-CM | POA: Diagnosis not present

## 2022-04-23 DIAGNOSIS — M25562 Pain in left knee: Secondary | ICD-10-CM | POA: Diagnosis not present

## 2022-04-23 DIAGNOSIS — G8929 Other chronic pain: Secondary | ICD-10-CM

## 2022-04-23 DIAGNOSIS — M1712 Unilateral primary osteoarthritis, left knee: Secondary | ICD-10-CM | POA: Diagnosis not present

## 2022-04-23 DIAGNOSIS — E785 Hyperlipidemia, unspecified: Secondary | ICD-10-CM

## 2022-04-23 MED ORDER — METHYLPREDNISOLONE ACETATE 40 MG/ML IJ SUSP
80.0000 mg | INTRAMUSCULAR | Status: AC | PRN
  Administered 2022-04-23: 80 mg via INTRA_ARTICULAR

## 2022-04-23 MED ORDER — LIDOCAINE HCL 1 % IJ SOLN
2.0000 mL | INTRAMUSCULAR | Status: AC | PRN
  Administered 2022-04-23: 2 mL

## 2022-04-23 MED ORDER — BUPIVACAINE HCL 0.25 % IJ SOLN
2.0000 mL | INTRAMUSCULAR | Status: AC | PRN
  Administered 2022-04-23: 2 mL via INTRA_ARTICULAR

## 2022-04-23 NOTE — Progress Notes (Signed)
Office Visit Note   Patient: Anna Mcguire           Date of Birth: 09-06-1955           MRN: [id number] Visit Date: 04/23/2022              Requested by: Ladell Pier, MD [place],  [place] [address] PCP: Ladell Pier, MD   Assessment & Plan: Visit Diagnoses:  1. Chronic pain of left Mcguire   2. Pain of left thumb   3. Arthritis of carpometacarpal (CMC) joint of left thumb   4. Chronic left shoulder pain   5. Unilateral primary osteoarthritis, left Mcguire     Plan: Anna Mcguire is a pleasant 66 year old woman who presents today for left Mcguire pain left shoulder pain and left thumb pain.  The Mcguire is most significant to her.  She does have a history of left Mcguire valgus arthritis.  She has had an injection in the past that is helped.  She has also had a left shoulder injection in the past for her arthritis and rotator cuff tendinopathy.  This to help.  The thumb pain has been going on about a month without any particular injury.  X-rays do demonstrate arthritis at the [facility] joint of the left thumb with some subluxation.  Discussed all of this with her in detail.  The left Mcguire has significant valgus clinically measured at 15 degrees nonweightbearing.  At this point she thinks she would like to pursue Mcguire replacement.  We did encourage her to continue with smoking cessation.  She has good sensation in her left foot and a palpable pulse.  She does have a previous surgical scar from a fracture many years ago of her lower leg she is not a diabetic and she is a smoker but has cut down to 3 cigarettes a day.  We will refer her to Dr. Ninfa Linden.  With regards to her thumb she does have quite a bit of [place] arthritis and we will give her a Freedom splint to see if this helps her.  Her left shoulder she like to go forward with an injection today  Follow-Up Instructions: Return in about 1 week (around 04/30/2022), or For consideration of left Mcguire arthroplasty.   Orders:   Orders Placed This Encounter  Procedures   Large Joint Inj: L subacromial bursa   XR Mcguire 3 VIEW LEFT   XR Finger Thumb Left   No orders of the defined types were placed in this encounter.     Procedures: Large Joint Inj: L subacromial bursa on 04/23/2022 10:23 AM Indications: diagnostic evaluation and pain Details: 25 G 1.5 in needle, anterolateral approach  Arthrogram: No  Medications: 2 mL lidocaine 1 %; 80 mg methylPREDNISolone acetate 40 MG/ML; 2 mL bupivacaine 0.25 % Outcome: tolerated well, no immediate complications Procedure, treatment alternatives, risks and benefits explained, specific risks discussed. Consent was given by the patient.      Clinical Data: No additional findings.   Subjective: Chief Complaint  Patient presents with   Left Shoulder - Pain   Left Mcguire - Pain   Left Thumb - Pain    HPI Anna Mcguire is a pleasant 66 year old woman who presents in follow-up today with significant left Mcguire pain and disability.  She also is complaining of some left shoulder pain and left thumb pain no previous history of injury since her last visit in 2021.  Prior films demonstrate end-stage osteoarthritis  with valgus deformity.  She has evidence of rotator cuff arthropathy with the left humeral head articulating with the acromion  Review of Systems  All other systems reviewed and are negative.    Objective: Vital Signs: There were no vitals taken for this visit.  Physical Exam Constitutional:      Appearance: Normal appearance.  Pulmonary:     Effort: Pulmonary effort is normal.  Skin:    General: Skin is warm and dry.  Neurological:     Mental Status: She is alert.     Ortho Exam Left Mcguire: 15 degrees of valgus.  No effusion no redness no cellulitis.  She has a strong posterior tibial pulse.  Sensation is intact distally compartments are soft and nontender.  She does have crepitus with range of motion Left shoulder she has full forward elevation but  does have some pain she can internally rotate behind the back does have some positive impingement findings strength is intact. Left thumb no redness she has a strong palpable radial pulse.  Brisk capillary refill.  She does have some's [facility] and is tender to palpation in this area.  Sensation is intact Specialty Comments:  No specialty comments available.  Imaging: XR Finger Thumb Left  Result Date: 04/23/2022 Radiographs of the left hand were obtained.  Specifically the left thumb.  No evidence of any acute osseous fractures.  She does [facility] with subluxation.  XR Mcguire 3 VIEW LEFT  Result Date: 04/23/2022 Three-view radiographs of the left Mcguire were reviewed today.  These demonstrate bilateral tricompartmental arthritis though most significant on the left side with 15 to 20 degrees of noted valgus no acute fractures or other osseous abnormalities    PMFS History: Patient Active Problem List   Diagnosis Date Noted   Arthritis of carpometacarpal [place](CMC) joint of left thumb 04/23/2022   Pain in left shoulder 04/23/2022   Hypercalcemia 12/31/2021   Stage 3b chronic kidney disease (HCC) 12/31/2021   Allergic rhinitis 10/24/2020   Rotator cuff arthropathy of left shoulder 08/07/2020   Cervicalgia 08/07/2020   Chronic cough 03/21/2020   Alcohol use disorder, moderate, dependence (HCC) 01/30/2020   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   Tobacco dependence 01/30/2020   Diarrhea of infectious origin 01/30/2020   Hyponatremia 01/13/2020   Alcohol abuse 01/13/2020   Tobacco abuse 01/13/2020   Chronic diarrhea 01/13/2020   Hypophosphatemia 01/13/2020   Hypertension    Chronic obstructive pulmonary disease (HCC)    Alcohol use    Hypokalemia 01/12/2020   Tobacco use 10/06/2019   Unilateral primary osteoarthritis, left Mcguire 09/08/2019   Pain in right shoulder  08/11/2019   Past Medical History:  Diagnosis Date   Acid reflux    Arthritis    Hypertension     Family History  Problem Relation Age of Onset   Kidney failure Mother    Aneurysm Father     Past Surgical History:  Procedure Laterality Date   COLONOSCOPY WITH PROPOFOL N/A 06/07/2014   Procedure: COLONOSCOPY WITH PROPOFOL;  Surgeon: Willis ModenaWilliam Outlaw, MD;  Location: [facility];  Service: Endoscopy;  Laterality: N/A;   ESOPHAGOGASTRODUODENOSCOPY (EGD) WITH PROPOFOL N/A 06/07/2014   Procedure: ESOPHAGOGASTRODUODENOSCOPY (EGD) WITH PROPOFOL;  Surgeon: Willis ModenaWilliam Outlaw, MD;  Location: [facility] END[facility];  Service: Endoscopy;  Laterality: N/A;   Social History   Occupational History   Not on file  Tobacco Use   Smoking  status: Every Day    Packs/day: 0.50    Years: 48.00    Total pack years: 24.00    Types: Cigarettes   Smokeless tobacco: Never   Tobacco comments:    3 times a week - 10/24/2020  Substance and Sexual Activity   Alcohol use: Yes    Comment: occ   Drug use: Yes    Types: Marijuana   Sexual activity: Not Currently

## 2022-04-28 ENCOUNTER — Ambulatory Visit (INDEPENDENT_AMBULATORY_CARE_PROVIDER_SITE_OTHER): Payer: Medicare Other | Admitting: Physician Assistant

## 2022-04-28 ENCOUNTER — Encounter: Payer: Self-pay | Admitting: Physician Assistant

## 2022-04-28 DIAGNOSIS — M1712 Unilateral primary osteoarthritis, left knee: Secondary | ICD-10-CM | POA: Diagnosis not present

## 2022-04-28 NOTE — Progress Notes (Signed)
HPI: [name] 66 year old female were seen for the first time.  However she is seeing Dr. Durward Fortes for her knee for some time and had injections in the knee.  She has end-stage arthritis of her left knee with valgus deformity.  She comes in today to discuss possible left knee replacement.  She has had no new injury to the knee.  She does ambulate with a cane.  Patient with a history of alcohol, tobacco abuse stage III chronic kidney disease syndrome of inappropriate ADH production COPD and emphysema.  Patient states that she is cut back to drinking 112 ounce beer daily over the last 3 weeks prior to that she was drinking 2 to 340 ounce beers per day.  She is smoking 1 to 2 cigarettes/day.  She is nondiabetic.  Review of systems: Denies any fevers or chills or ongoing infections.  Otherwise please see HPI otherwise negative or noncontributory.   General: Well-developed well-nourished female in no acute distress mood and affect appropriate.  Walks with an antalgic gait and is severely valgus knee on the left. Psych: Alert and oriented x3.  Left knee no abnormal warmth erythema or effusion.  Approximate 15 degree of valgus deformity.  I am able to create this back to almost neutral.  Tello femoral crepitus with passive range of motion of the knee.  Full extension full flexion.   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 Delilah Shan and myself had a long discussion with the patient about her alcohol use and the need to continue to cut back on her alcohol intake.  Talked to her about any ongoing infections.  She does have some teeth that she needs to have looked at by her dentist therefore would recommend that she see her dentist prior to scheduling knee replacement.  Once she has had her dental issues addressed we could work on scheduling her for a knee replacement.  This most likely be total knee with revision components given the deformity of her left knee.

## 2022-04-30 ENCOUNTER — Other Ambulatory Visit: Payer: Self-pay | Admitting: Internal Medicine

## 2022-04-30 DIAGNOSIS — I10 Essential (primary) hypertension: Secondary | ICD-10-CM

## 2022-05-06 ENCOUNTER — Encounter: Payer: Self-pay | Admitting: Pharmacist

## 2022-05-06 ENCOUNTER — Ambulatory Visit: Payer: Medicare Other | Attending: Internal Medicine | Admitting: Pharmacist

## 2022-05-06 DIAGNOSIS — I10 Essential (primary) hypertension: Secondary | ICD-10-CM

## 2022-05-06 MED ORDER — HYDRALAZINE HCL 10 MG PO TABS: 10.0000 mg | ORAL_TABLET | Freq: Three times a day (TID) | ORAL | 1 refills | Status: DC

## 2022-05-06 NOTE — Progress Notes (Signed)
   S:     No chief complaint on file.  66 y.o. female who presents for hypertension evaluation, education, and management. PMH is significant for HTN, COPD, SIADH, CKD, OA, hx of multiple electrolyte abnormalities, hx of alcohol abuse, hx of tobacco abuse.  Patient was referred and last seen by Primary Care Provider, Dr. Wynetta Emery, on 04/03/2022. BP was 166/78 mmHg at that visit.  At last visit, hydralazine was added to her regimen.   Today, patient arrives in good spirits and presents without assistance. Denies dizziness, headache, blurred vision, swelling.   Patient reports hypertension is longstanding.   Family/Social history:  FHX: kidney failure, aneurysm  Tobacco: current 0.5 PPD smoker  Alcohol: no current use reported   Medication adherence reported. Patient has taken BP medications today.   Current antihypertensives include: amlodipine 10 mg daily, hydralazine 10 mg BID, irbesartan 75 mg daily  Reported home BP readings: none  Patient reported dietary habits:  -Admits to "needing to lay off the salt"  -Denies drinking caffeine   Patient-reported exercise habits:  -None, limited d/t slow gait with the use of an assistive device  O:  Vitals:   05/06/22 1020  BP: (!) 145/65  Pulse: 80    Last 3 Office BP readings: BP Readings from Last 3 Encounters:  05/06/22 (!) 145/65  04/03/22 (!) 166/78  03/03/22 (!) 141/78    BMET    Component Value Date/Time   NA 139 03/02/2022 1537   NA 138 01/03/2022 1459   K 4.4 03/02/2022 1537   CL 110 03/02/2022 1537   CO2 23 03/02/2022 1537   GLUCOSE 107 (H) 03/02/2022 1537   BUN 22 03/02/2022 1537   BUN 25 01/03/2022 1459   CREATININE 1.34 (H) 03/02/2022 1537   CALCIUM 10.3 03/02/2022 1537   GFRNONAA 43 (L) 03/02/2022 1537   GFRAA 75 01/30/2020 1652    Renal function: CrCl cannot be calculated (Patient's most recent lab result is older than the maximum 21 days allowed.).  Clinical ASCVD: No  The 10-year ASCVD risk  score (Arnett DK, et al., 2019) is: 20.6%   Values used to calculate the score:     Age: 32 years     Sex: Female     Is Non-Hispanic African American: Yes     Diabetic: No     Tobacco smoker: Yes     Systolic Blood Pressure: 765 mmHg     Is BP treated: Yes     HDL Cholesterol: 82 mg/dL     Total Cholesterol: 155 mg/dL  A/P: Hypertension diagnosed currently above goal but improving on current medications. BP goal < 130/80 mmHg. Medication adherence appears appropriate.  -Increased dose of hydralazine to 10 mg TID.  -Continue amlodipine 10 mg daily, irbesartan 75 mg daily.  -Patient educated on purpose, proper use, and potential adverse effects of hydralazine.  -F/u labs ordered - none -Counseled on lifestyle modifications for blood pressure control including reduced dietary sodium, increased exercise, adequate sleep. -Encouraged patient to check BP at home and bring log of readings to next visit. Counseled on proper use of home BP cuff.    Results reviewed and written information provided.    Written patient instructions provided. Patient verbalized understanding of treatment plan.  Total time in face to face counseling 20 minutes.    Follow-up:  Pharmacist in 1 month.   Benard Halsted, PharmD, [place], [place] [phone]

## 2022-05-24 ENCOUNTER — Encounter: Payer: Self-pay | Admitting: *Deleted

## 2022-06-09 ENCOUNTER — Ambulatory Visit (INDEPENDENT_AMBULATORY_CARE_PROVIDER_SITE_OTHER): Payer: Medicare Other | Admitting: Podiatry

## 2022-06-09 ENCOUNTER — Encounter: Payer: Self-pay | Admitting: Podiatry

## 2022-06-09 VITALS — BP 136/59

## 2022-06-09 DIAGNOSIS — M79675 Pain in left toe(s): Secondary | ICD-10-CM

## 2022-06-09 DIAGNOSIS — B351 Tinea unguium: Secondary | ICD-10-CM

## 2022-06-09 DIAGNOSIS — B353 Tinea pedis: Secondary | ICD-10-CM

## 2022-06-09 DIAGNOSIS — L84 Corns and callosities: Secondary | ICD-10-CM

## 2022-06-09 DIAGNOSIS — M79671 Pain in right foot: Secondary | ICD-10-CM

## 2022-06-09 DIAGNOSIS — M79674 Pain in right toe(s): Secondary | ICD-10-CM | POA: Diagnosis not present

## 2022-06-09 MED ORDER — KETOCONAZOLE 2 % EX CREA: TOPICAL_CREAM | CUTANEOUS | 1 refills | Status: DC

## 2022-06-09 NOTE — Progress Notes (Signed)
Subjective:  Patient ID: Anna Mcguire, female    DOB: Nov 22, 1955,  MRN: [id number]  Anna Mcguire presents to clinic today for painful thick toenails that are difficult to trim. Pain interferes with ambulation. Aggravating factors include wearing enclosed shoe gear. Pain is relieved with periodic professional debridement.  Chief Complaint  Patient presents with   Nail Problem    Nail trim  Not Diabetic  PCP- Dr Karle Plumber , last OV 05/2022   New problem(s):  Patient relates she has new onset of left foot pain on plantar aspect near submet head 1 and proximal to it as well. She relates she stubbed her foot on last Friday. Pain is aggravated with weightbearing   PCP is Ladell Pier, MD.  Allergies  Allergen Reactions   Chantix [Varenicline Tartrate]     Diarrhea and dizziness   Penicillins Diarrhea    Review of Systems: Negative except as noted in the HPI.  Objective: No changes noted in today's physical examination. Vitals:   06/09/22 1202  BP: (!) 136/59   Anna Mcguire is a pleasant 66 y.o. female WD, WN in NAD. AAO x 3. Vascular Capillary refill time to digits immediate b/l. Palpable pedal pulses b/l LE. Pedal hair sparse. Lower extremity skin temperature gradient within normal limits. No pain with calf compression b/l. No edema noted b/l lower extremities.  No cyanosis or clubbing noted.  Neurologic Normal speech. Oriented to person, place, and time. Protective sensation intact 5/5 intact bilaterally with 10g monofilament b/l. Vibratory sensation intact b/l. Proprioception intact bilaterally.  Dermatologic Pedal skin with normal turgor, texture and tone bilaterally. No open wounds bilaterally. No interdigital macerations bilaterally.   Toenails 1-5 b/l elongated, discolored, dystrophic, thickened, crumbly with subungual debris and tenderness to dorsal palpation.   Anonychia noted L 2nd toe. Nailbed(s) epithelialized.    Diffuse scaling  noted peripherally and plantarly b/l feet.  No interdigital macerations.  No blisters, no weeping. No signs of secondary bacterial infection noted.  Hyperkeratotic lesion(s) R 3rd toe and submet head 5 right foot.  No erythema, no edema, no drainage, no fluctuance.  Orthopedic: Normal muscle strength 5/5 to all lower extremity muscle groups bilaterally. Pain on palpation submet head 1 left foot. No erythema, no edema, no ecchymosis. Hallux valgus with bunion deformity noted b/l lower extremities. Hammertoes noted to the b/l lower extremities.   Assessment/Plan: 1. Pain due to onychomycosis of toenails of both feet   2. Tinea pedis of both feet     Meds ordered this encounter  Medications   ketoconazole (NIZORAL) 2 % cream    Sig: Apply to both feet and between toes once daily for 6 weeks.    Dispense:  60 g    Refill:  1    -Consent given for treatment as described below: -Discussed injury left foot and she will be scheduled with first available physician for evaluation. -Continue supportive shoe gear daily. -Toenails 1-5 right foot, 3-5 left foot, and left great toe debrided in length and girth without iatrogenic bleeding with sterile nail nipper and dremel.  -As a courtesy, corn(s) R 3rd toe pared utilizing sterile scalpel blade without complication or incident. Total number pared=1. -As a courtesy, callus(es) submet head 5 right foot pared utilizing sterile scalpel blade without complication or incident. Total number pared=1. -For tinea pedis, Rx sent to pharmacy for Ketoconazole Cream 2% to be applied once daily for six weeks. -Patient/POA to call should there be question/concern in the interim.  Return in about 3 months (around 09/08/2022).  Freddie Breech, DPM

## 2022-06-11 ENCOUNTER — Encounter: Payer: Self-pay | Admitting: *Deleted

## 2022-06-11 NOTE — Progress Notes (Signed)
Pt has ongoing f/u with PCP Dr. Jonah Blue at [facility], as well as with PharmD, Dr. Lois Huxley at [facility] who is reviewing her HTN Rx. She also has ongoing ortho and podiatry care with her b/p at her 06/09/22 visit with podiatry being 136/59. Letter with results sent to pt. No further health equity team support indicated at this time.

## 2022-06-12 ENCOUNTER — Ambulatory Visit: Payer: Medicare Other | Admitting: Pharmacist

## 2022-06-18 ENCOUNTER — Other Ambulatory Visit: Payer: Self-pay | Admitting: Internal Medicine

## 2022-06-18 ENCOUNTER — Telehealth: Payer: Self-pay | Admitting: Internal Medicine

## 2022-06-18 DIAGNOSIS — I10 Essential (primary) hypertension: Secondary | ICD-10-CM

## 2022-06-18 DIAGNOSIS — K219 Gastro-esophageal reflux disease without esophagitis: Secondary | ICD-10-CM

## 2022-06-18 MED ORDER — MOMETASONE FURO-FORMOTEROL FUM 100-5 MCG/ACT IN AERO: 2.0000 | INHALATION_SPRAY | Freq: Two times a day (BID) | RESPIRATORY_TRACT | 2 refills | Status: DC

## 2022-06-18 NOTE — Telephone Encounter (Signed)
My pahrmacy called and they stated that Symbicort will no longer be manufactured in 2024/ pt needs an alternative inhaler sent to pharmacy / her insurance does not cover generic and will only cover a name brand / please advise

## 2022-06-18 NOTE — Telephone Encounter (Signed)
Rx for [facility] sent.

## 2022-06-23 ENCOUNTER — Other Ambulatory Visit: Payer: Self-pay | Admitting: Pharmacist

## 2022-06-23 DIAGNOSIS — K219 Gastro-esophageal reflux disease without esophagitis: Secondary | ICD-10-CM

## 2022-06-23 MED ORDER — PANTOPRAZOLE SODIUM 40 MG PO TBEC: 40.0000 mg | DELAYED_RELEASE_TABLET | Freq: Every day | ORAL | 0 refills | Status: DC

## 2022-06-24 ENCOUNTER — Ambulatory Visit: Payer: Medicare Other | Admitting: Pharmacist

## 2022-07-16 ENCOUNTER — Other Ambulatory Visit: Payer: Self-pay | Admitting: Internal Medicine

## 2022-07-16 DIAGNOSIS — I10 Essential (primary) hypertension: Secondary | ICD-10-CM

## 2022-07-17 ENCOUNTER — Other Ambulatory Visit: Payer: Self-pay | Admitting: Internal Medicine

## 2022-07-17 DIAGNOSIS — K219 Gastro-esophageal reflux disease without esophagitis: Secondary | ICD-10-CM

## 2022-08-05 ENCOUNTER — Encounter: Payer: Self-pay | Admitting: Internal Medicine

## 2022-08-05 ENCOUNTER — Ambulatory Visit: Payer: 59 | Attending: Internal Medicine | Admitting: Internal Medicine

## 2022-08-05 VITALS — BP 138/60 | HR 72 | Temp 98.2°F | Ht 66.0 in | Wt 120.0 lb

## 2022-08-05 DIAGNOSIS — Z23 Encounter for immunization: Secondary | ICD-10-CM | POA: Diagnosis not present

## 2022-08-05 DIAGNOSIS — F17200 Nicotine dependence, unspecified, uncomplicated: Secondary | ICD-10-CM | POA: Diagnosis not present

## 2022-08-05 DIAGNOSIS — F1020 Alcohol dependence, uncomplicated: Secondary | ICD-10-CM

## 2022-08-05 DIAGNOSIS — L299 Pruritus, unspecified: Secondary | ICD-10-CM

## 2022-08-05 DIAGNOSIS — M1712 Unilateral primary osteoarthritis, left knee: Secondary | ICD-10-CM | POA: Diagnosis not present

## 2022-08-05 DIAGNOSIS — R21 Rash and other nonspecific skin eruption: Secondary | ICD-10-CM

## 2022-08-05 DIAGNOSIS — I10 Essential (primary) hypertension: Secondary | ICD-10-CM

## 2022-08-05 DIAGNOSIS — J449 Chronic obstructive pulmonary disease, unspecified: Secondary | ICD-10-CM

## 2022-08-05 DIAGNOSIS — Z1231 Encounter for screening mammogram for malignant neoplasm of breast: Secondary | ICD-10-CM

## 2022-08-05 DIAGNOSIS — N1832 Chronic kidney disease, stage 3b: Secondary | ICD-10-CM

## 2022-08-05 DIAGNOSIS — N1831 Chronic kidney disease, stage 3a: Secondary | ICD-10-CM | POA: Diagnosis not present

## 2022-08-05 DIAGNOSIS — R109 Unspecified abdominal pain: Secondary | ICD-10-CM | POA: Diagnosis not present

## 2022-08-05 DIAGNOSIS — Z780 Asymptomatic menopausal state: Secondary | ICD-10-CM

## 2022-08-05 DIAGNOSIS — R10A2 Flank pain, left side: Secondary | ICD-10-CM

## 2022-08-05 DIAGNOSIS — F320 Major depressive disorder, single episode, mild: Secondary | ICD-10-CM

## 2022-08-05 MED ORDER — DICLOFENAC SODIUM 1 % EX GEL: 2.0000 g | Freq: Three times a day (TID) | CUTANEOUS | 1 refills | Status: DC

## 2022-08-05 MED ORDER — TIOTROPIUM BROMIDE MONOHYDRATE 18 MCG IN CAPS: ORAL_CAPSULE | RESPIRATORY_TRACT | 6 refills | Status: DC

## 2022-08-05 MED ORDER — ZOSTER VAC RECOMB ADJUVANTED 50 MCG/0.5ML IM SUSR: 0.5000 mL | Freq: Once | INTRAMUSCULAR | 0 refills | Status: AC

## 2022-08-05 MED ORDER — MOMETASONE FURO-FORMOTEROL FUM 100-5 MCG/ACT IN AERO: 2.0000 | INHALATION_SPRAY | Freq: Two times a day (BID) | RESPIRATORY_TRACT | 6 refills | Status: DC

## 2022-08-05 MED ORDER — IRBESARTAN 75 MG PO TABS: 75.0000 mg | ORAL_TABLET | Freq: Every day | ORAL | 1 refills | Status: DC

## 2022-08-05 MED ORDER — BUPROPION HCL ER (SR) 100 MG PO TB12: 100.0000 mg | ORAL_TABLET | Freq: Two times a day (BID) | ORAL | 6 refills | Status: DC

## 2022-08-05 MED ORDER — AMLODIPINE BESYLATE 10 MG PO TABS: ORAL_TABLET | ORAL | 1 refills | Status: DC

## 2022-08-05 MED ORDER — TRIAMCINOLONE ACETONIDE 0.1 % EX CREA: 1.0000 | TOPICAL_CREAM | Freq: Two times a day (BID) | CUTANEOUS | 1 refills | Status: DC

## 2022-08-05 NOTE — Progress Notes (Unsigned)
Patient ID: Anna Mcguire, female    DOB: 1955/09/12  MRN: [id number]  CC: Hypertension (Chronic obstructive pulmonary dx & HTN f/u. Med refills/Pain on lower L side of back.R & L shoulder pain  /Itching on R side of hip)   Subjective: Anna Mcguire is a 67 y.o. female who presents for chronic disease management. Has infant grand-daughter with her today Her concerns today include:  atient with history of HTN, HL, tob dep, COPD, OA left knee, EtOH abuse, chronic diarrhea, SIADH, ACD with macrocytosis, CKD 3, Hypercalcemia likely due to primary hyperparathyroid   HTN: On hydralazine 10 mg 3 times a day, Norvasc 10 mg daily and irbesartan 75 mg daily.  Did not take meds as yet this a.m.  Does not check BP Limit salt in the foods. We have been monitoring kidney function.  Last GFR was 44.  Range has been 37-62 in the past 2 years.  She is not on any oral NSAIDs.  COPD/tobacco dependence: On Dulera and Spiriva.  Reports compliance with using them. On last visit she indicated taking bupropion to help with smoking cessation. No longer taking but request RF.  Still smoking 3 cigarettes a day. Smoked for over 50 yrs.  Heavies was 1/2 pk a day which equates to 25 pk/years.   Osteoarthritis of the knees: Has seen orthopedics.  Needs TKR of the left knee.  Orthopedics advised that she has dental work done first and to cut back on EtOH use.  No falls since last visit.  Has appt with dentist tomorrow.  Down to two 12 oz cans beer a day.  Use to drink two 40 oz beers   Requests refill on Voltaren gel which helps. C/o pain LT lower back x 1 mth.  No initiating factors.  Constant.  No radiation.  No burning with urination/hematuria.  Takes Tylenol Q 4 hrs.    Pos dep/GAD7 screen:  denies any major issues with either.  "I just be lonely by myself."  Lives alone and lacks transportation.  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  Daughter comes by daily and  her brothers visit 2-3 x/mth  C/o itchy on RT side of hip.  Chronic.  She has tried using Vaseline and various OTC lotion.  Triamcinolone cream helps some but still itches  HM: Due for mammogram, bone density study, shingles vaccine, Medicare wellness visit Patient Active Problem List   Diagnosis Date Noted   Arth[facility] of carpometacarpal  [facility]) joint of left thumb 04/23/2022   Pain in left shoulder 04/23/2022   Hypercalcemia 12/31/2021   Stage 3b chronic kidney disease (HCC) 12/31/2021   Allergic rhinitis 10/24/2020   Rotator cuff arthropathy of left shoulder 08/07/2020   Cervicalgia 08/07/2020   Chronic cough 03/21/2020   Alcohol use disorder, moderate, dependence (HCC) 01/30/2020   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   Tobacco dependence 01/30/2020   Diarrhea of infectious origin 01/30/2020   Hyponatremia 01/13/2020   Alcohol abuse 01/13/2020   Tobacco abuse 01/13/2020   Chronic diarrhea 01/13/2020   Hypophosphatemia 01/13/2020   Hypertension    Chronic obstructive pulmonary disease (HCC)    Alcohol use    Hypokalemia 01/12/2020   Tobacco use 10/06/2019   Unilateral primary osteoarthritis, left knee 09/08/2019   Pain in right shoulder 08/11/2019     Current Outpatient Medications on File Prior to Visit  Medication Sig Dispense Refill   albuterol (PROVENTIL) (2.5 MG/3ML)  0.083% nebulizer solution inhale THE contents of 1 vial PER nebulizer EVERY 6 HOURS AS NEEDED FOR wheezing OR SHORTNESS OF BREATH 150 mL 5   albuterol (VENTOLIN HFA) 108 (90 Base) MCG/ACT inhaler Inhale 2 puffs into the lungs every 6 (six) hours as needed for wheezing or shortness of breath. 8.5 g 6   atorvastatin (LIPITOR) 80 MG tablet TAKE 1 TABLET (80mg ) BY MOUTH DAILY 90 tablet 1   hydrALAZINE (APRESOLINE) 10 MG tablet Take 1 tablet (10 mg total) by mouth 3 (three) times daily. 270 tablet 1   pantoprazole (PROTONIX) 40 MG tablet TAKE 1 Tablet BY MOUTH ONCE DAILY 14 tablet 0    loratadine (CLARITIN) 10 MG tablet Take 1 tablet (10 mg total) by mouth daily. (Patient not taking: Reported on 12/31/2021) 90 tablet 2   [DISCONTINUED] fluticasone (FLONASE) 50 MCG/ACT nasal spray Place 1 spray into both nostrils daily.     [DISCONTINUED] lisinopril-hydrochlorothiazide (PRINZIDE,ZESTORETIC) 20-12.5 MG per tablet Take 1 tablet by mouth daily.     [DISCONTINUED] lovastatin (MEVACOR) 20 MG tablet Take 20 mg by mouth daily at 6 PM.     No current facility-administered medications on file prior to visit.    Allergies  Allergen Reactions   Chantix [Varenicline Tartrate]     Diarrhea and dizziness   Penicillins Diarrhea    Social History   Socioeconomic History   Marital status: Single    Spouse name: Not on file   Number of children: Not on file   Years of education: Not on file   Highest education level: Not on file  Occupational History   Not on file  Tobacco Use   Smoking status: Every Day    Packs/day: 0.50    Years: 48.00    Total pack years: 24.00    Types: Cigarettes   Smokeless tobacco: Never   Tobacco comments:    3 times a week - 10/24/2020  Substance and Sexual Activity   Alcohol use: Yes    Comment: occ   Drug use: Yes    Types: Marijuana   Sexual activity: Not Currently  Other Topics Concern   Not on file  Social History Narrative   Not on file   Social Determinants of Health   Financial Resource Strain: Not on file  Food Insecurity: Not on file  Transportation Needs: Not on file  Physical Activity: Not on file  Stress: Not on file  Social Connections: Not on file  Intimate Partner Violence: Not on file    Family History  Problem Relation Age of Onset   Kidney failure Mother    Aneurysm Father     Past Surgical History:  Procedure Laterality Date   COLONOSCOPY WITH PROPOFOL N/A 06/07/2014   Procedure: COLONOSCOPY WITH PROPOFOL;  Surgeon: Arta Silence, MD;  Location: [facility];  Service: Endoscopy;  Laterality: N/A;    ESOPHAGOGASTRODUODENOSCOPY (EGD) WITH PROPOFOL N/A 06/07/2014   Procedure: ESOPHAGOGASTRODUODENOSCOPY (EGD) WITH PROPOFOL;  Surgeon: Arta Silence, MD;  Location: [facility];  Service: Endoscopy;  Laterality: N/A;    ROS: Review of Systems Negative except as stated above  PHYSICAL EXAM: BP 138/60   Pulse 72   Temp 98.2 F (36.8 C) (Oral)   Ht 5\' 6"  (1.676 m)   Wt 120 lb (54.4 kg)   SpO2 100%   BMI 19.37 kg/m   Wt Readings from Last 3 Encounters:  08/05/22 120 lb (54.4 kg)  04/03/22 118 lb 3.2 oz (53.6 kg)  03/02/22 120 lb (54.4 kg)  Physical Exam  General appearance -older African-American female who appears underweight for height.  She is in NAD. Mental status - normal mood, behavior, speech, dress, motor activity, and thought processes Neck - supple, no significant adenopathy Chest - clear to auscultation, no wheezes, rales or rhonchi, symmetric air entry Heart - RRR, soft SEM LUSB Musculoskeletal -gait is slow with low foot to floor clearance.  Valgum deformity LT knee. Mild tenderness on palpation of the left flank.  Straight leg raise negative. Extremities -no lower extremity edema Skin: No rash noted on the right buttock.  Skin is a little dry.    08/05/2022    8:44 AM 04/03/2022   10:34 AM 06/25/2021    1:58 PM  Depression screen PHQ 2/9  Decreased Interest 2 2 2   Down, Depressed, Hopeless 2 2 2   PHQ - 2 Score 4 4 4   Altered sleeping 2 2 2   Tired, decreased energy 2 2 2   Change in appetite 0 2 2  Feeling bad or failure about yourself  2 2 2   Trouble concentrating 2 0 2  Moving slowly or fidgety/restless 0 0 0  Suicidal thoughts 0 0 0  PHQ-9 Score 12 12 14       08/05/2022    8:44 AM 04/03/2022   10:35 AM 06/25/2021    1:59 PM 05/28/2021    2:25 PM  GAD 7 : Generalized Anxiety Score  Nervous, Anxious, on Edge 2 2 2    Control/stop worrying 2 2 2 2   Worry too much - different things 2 2 2 2   Trouble relaxing 2 2 2 2   Restless 2 2 2 2   Easily annoyed  or irritable 2 2 2 2   Afraid - awful might happen 2 0 2 1  Total GAD 7 Score 14 12 14           Latest Ref Rng & Units 03/02/2022    3:37 PM 01/21/2022   10:31 AM 01/03/2022    2:59 PM  CMP  Glucose 70 - 99 mg/dL 08/07/2022   80   BUN 8 - 23 mg/dL 22   25   Creatinine 04/05/2022 - 1.00 mg/dL 06/27/2021   05/30/2021   Sodium - 145 mmol/L 139   138   Potassium 3.5 - 5.1 mmol/L 4.4   5.0   Chloride 98 - 111 mmol/L 110   105   CO2 22 - 32 mmol/L 23   22   Calcium 8.9 - 10.3 mg/dL     Total Protein 6.5 - 8.1 g/dL 6.5     Total Bilirubin 0.3 - 1.2 mg/dL 0.3     Alkaline Phos 38 - 126 U/L 115     AST 15 - 41 U/L 27     ALT 0 - 44 U/L 25      Lipid Panel     Component Value Date/Time   CHOL 155 03/20/2021 1454   TRIG 258 (H) 03/20/2021 1454   HDL 82 03/20/2021 1454   CHOLHDL 1.9 03/20/2021 1454   CHOLHDL 1.5 01/15/2020 0258   VLDL 23 01/15/2020 0258   LDLCALC 35 03/20/2021 1454    CBC    Component Value Date/Time   WBC 4.7 03/02/2022 1537   RBC 3.02 (L) 03/02/2022 1537   HGB 9.9 (L) 03/02/2022 1537   HGB 9.6 (L) 01/21/2022 1031   HCT 30.5 (L) 03/02/2022 1537   HCT 29.1 (L) 01/21/2022 1031   PLT 255 03/02/2022 1537   PLT 239 01/21/2022  1031   MCV 101.0 (H) 03/02/2022 1537   MCV 97 01/21/2022 1031   MCH 32.8 03/02/2022 1537   MCHC 32.5 03/02/2022 1537   RDW 13.0 03/02/2022 1537   RDW 12.8 01/21/2022 1031   LYMPHSABS 1.4 03/02/2022 1537   MONOABS 0.6 03/02/2022 1537   EOSABS 0.2 03/02/2022 1537   BASOSABS 0.1 03/02/2022 1537   Results for orders placed or performed in visit on 04/03/22  Iron, TIBC and Ferritin Panel  Result Value Ref Range   Total Iron Binding Capacity 367 250 - 450 ug/dL   UIBC 256 118 - 369 ug/dL   Iron 111 27 - 139 ug/dL   Iron Saturation 30 15 - 55 %   Ferritin 99 15 - 150 ng/mL   Results for orders placed or performed in visit on 33/29/51  Basic Metabolic Panel  Result Value Ref Range   Glucose 86 70 - 99 mg/dL   BUN 19 8 - 27 mg/dL    Creatinine, Ser 1.08 (H) 0.57 - 1.00 mg/dL   eGFR 56 (L) >59 mL/min/1.73   BUN/Creatinine Ratio 18 12 - 28   Sodium 140 134 - 144 mmol/L   Potassium 4.4 3.5 - 5.2 mmol/L   Chloride 106 96 - 106 mmol/L   CO2 19 (L) 20 - 29 mmol/L   Calcium 10.9 (H) 8.7 - 10.3 mg/dL  POCT URINALYSIS DIP (CLINITEK)  Result Value Ref Range   Color, UA yellow yellow   Clarity, UA cloudy (A) clear   Glucose, UA negative negative mg/dL   Bilirubin, UA negative negative   Ketones, POC UA negative negative mg/dL   Spec Grav, UA 1.020 1.010 - 1.025   Blood, UA negative negative   pH, UA 6.0 5.0 - 8.0   POC PROTEIN,UA negative negative, trace   Urobilinogen, UA 0.2 0.2 or 1.0 E.U./dL   Nitrite, UA Negative Negative   Leukocytes, UA Small (1+) (A) Negative     ASSESSMENT AND PLAN: 1. Essential hypertension Close to goal.  She has not taken medicines as yet for today.  She will take them when she returns home.  Medications include hydralazine 10 mg 3 times a day, Norvasc 10 mg daily and irbesartan 75 mg daily. - amLODipine (NORVASC) 10 MG tablet; TAKE 1 TABLET BY MOUTH DAILY FOR blood pressure  Dispense: 90 tablet; Refill: 1 - irbesartan (AVAPRO) 75 MG tablet; Take 1 tablet (75 mg total) by mouth daily.  Dispense: 90 tablet; Refill: 1  2. Chronic obstructive pulmonary disease, unspecified COPD type ([place]) Stable.  Continue Dulera and Spiriva - mometasone-formoterol (DULERA) 100-5 MCG/ACT AERO; Inhale 2 puffs into the lungs 2 (two) times daily.  Dispense: 13 g; Refill: 6 - tiotropium (SPIRIVA HANDIHALER) 18 MCG inhalation capsule; place ONE CAPSULE into inhaler AND inhale daily  Dispense: 30 capsule; Refill: 6  3. Tobacco dependence Strongly advised to quit.  She is aware of health risks associated with smoking.  Refill given on bupropion. - buPROPion ER (WELLBUTRIN SR) 100 MG 12 hr tablet; Take 1 tablet (100 mg total) by mouth 2 (two) times daily.  Dispense: 60 tablet; Refill: 6  4. Alcohol use disorder,  moderate, dependence ([place]) Commended her on cutting back.  Challenged her to cut back more to no more than  one 12 ounce beer a day.  5. Stage 3b chronic kidney disease ([place]) Recheck kidney function today. - Basic Metabolic Panel  6. Mild major depression ([place]) Patient does not feel she needs any medication or counseling at  this time.  She feels social isolation may be playing a role which is worsened by no transportation.  However she states she feels better now that she babysits her granddaughter.  I told her that bupropion would help nonetheless with depressed mood.  7. Primary osteoarthritis of left knee Patient to keep appointment with dentist.  Once she has her teeth fixed, Ortho will consider doing her knee replacement surgery - diclofenac Sodium (VOLTAREN) 1 % GEL; Apply 2 g topically 3 (three) times daily.  Dispense: 100 g; Refill: 1  8. Itching No rash seen.  Advised use of moisturizing lotion when she gets out of the tub every day like palmer cocoa butter cream - Ambulatory referral to Dermatology - triamcinolone cream (KENALOG) 0.1 %; Apply 1 Application topically 2 (two) times daily.  Dispense: 30 g; Refill: 1  9. Acute left flank pain Likely musculoskeletal in nature.  She will use Voltaren gel and Tylenol. - POCT URINALYSIS DIP (CLINITEK) - diclofenac Sodium (VOLTAREN) 1 % GEL; Apply 2 g topically 3 (three) times daily.  Dispense: 100 g; Refill: 1  10. Encounter for screening mammogram for malignant neoplasm of breast - MM Digital Screening; Future  11. Need for shingles vaccine - Zoster Vaccine Adjuvanted [facility]) injection; Inject 0.5 mLs into the muscle once for 1 dose.  Dispense: 0.5 mL; Refill: 0  12. Rash - Ambulatory referral to Dermatology  13. Postmenopausal estrogen deficiency Patient agreeable to doing bone density study. - DG Bone Density; Future     Patient was given the opportunity to ask questions.  Patient verbalized understanding of the plan  and was able to repeat key elements of the plan.   This documentation was completed using Radio producer.  Any transcriptional errors are unintentional.  Orders Placed This Encounter  Procedures   MM Digital Screening   DG Bone Density   Basic Metabolic Panel   Ambulatory referral to Dermatology   POCT URINALYSIS DIP (CLINITEK)     Requested Prescriptions   Signed Prescriptions Disp Refills   amLODipine (NORVASC) 10 MG tablet 90 tablet 1    Sig: TAKE 1 TABLET BY MOUTH DAILY FOR blood pressure   buPROPion ER (WELLBUTRIN SR) 100 MG 12 hr tablet 60 tablet 6    Sig: Take 1 tablet (100 mg total) by mouth 2 (two) times daily.   irbesartan (AVAPRO) 75 MG tablet 90 tablet 1    Sig: Take 1 tablet (75 mg total) by mouth daily.   mometasone-formoterol (DULERA) 100-5 MCG/ACT AERO 13 g 6    Sig: Inhale 2 puffs into the lungs 2 (two) times daily.   tiotropium (SPIRIVA HANDIHALER) 18 MCG inhalation capsule 30 capsule 6    Sig: place ONE CAPSULE into inhaler AND inhale daily   triamcinolone cream (KENALOG) 0.1 % 30 g 1    Sig: Apply 1 Application topically 2 (two) times daily.   Zoster Vaccine Adjuvanted [facility]) injection 0.5 mL 0    Sig: Inject 0.5 mLs into the muscle once for 1 dose.   diclofenac Sodium (VOLTAREN) 1 % GEL 100 g 1    Sig: Apply 2 g topically 3 (three) times daily.    Return in about 4 months (around 12/04/2022) for Give appt with Lurena Joiner in 1 mth for Medicare Wellness Visit.Karle Plumber, MD, FACP

## 2022-08-06 ENCOUNTER — Ambulatory Visit: Payer: Self-pay | Admitting: *Deleted

## 2022-08-06 ENCOUNTER — Other Ambulatory Visit: Payer: Self-pay | Admitting: Internal Medicine

## 2022-08-06 DIAGNOSIS — K219 Gastro-esophageal reflux disease without esophagitis: Secondary | ICD-10-CM

## 2022-08-06 LAB — BASIC METABOLIC PANEL
BUN/Creatinine Ratio: 18 (ref 12–28)
BUN: 19 mg/dL (ref 8–27)
CO2: 19 mmol/L — ABNORMAL LOW (ref 20–29)
Calcium: 10.9 mg/dL — ABNORMAL HIGH (ref 8.7–10.3)
Chloride: 106 mmol/L (ref 96–106)
Creatinine, Ser: 1.08 mg/dL — ABNORMAL HIGH (ref 0.57–1.00)
Glucose: 86 mg/dL (ref 70–99)
Potassium: 4.4 mmol/L (ref 3.5–5.2)
Sodium: 140 mmol/L (ref 134–144)
eGFR: 56 mL/min/{1.73_m2} — ABNORMAL LOW (ref >59)

## 2022-08-06 LAB — POCT URINALYSIS DIP (CLINITEK)
Bilirubin, UA: NEGATIVE
Blood, UA: NEGATIVE
Glucose, UA: NEGATIVE mg/dL
Ketones, POC UA: NEGATIVE mg/dL
Nitrite, UA: NEGATIVE
POC PROTEIN,UA: NEGATIVE
Spec Grav, UA: 1.02 (ref 1.010–1.025)
Urobilinogen, UA: 0.2 E.U./dL
pH, UA: 6 (ref 5.0–8.0)

## 2022-08-06 NOTE — Telephone Encounter (Signed)
Pt given lab results per notes of Dr. Wynetta Emery  on 08/06/22. Pt verbalized understanding regarding kidney function and elevated calcium level. No further questions from patient.

## 2022-08-06 NOTE — Telephone Encounter (Signed)
Medication Refill - Medication: pantoprazole (PROTONIX) 40 MG tablet   Has the patient contacted their pharmacy? Yes.     Preferred Pharmacy (with phone number or street name): [facility] [place] [address]. Phone: [phone]  Fax: [phone]   Has the patient been seen for an appointment in the last year OR does the patient have an upcoming appointment? Yes.    Agent: Please be advised that RX refills may take up to 3 business days. We ask that you follow-up with your pharmacy.

## 2022-08-06 NOTE — Telephone Encounter (Signed)
Noted  

## 2022-08-07 MED ORDER — PANTOPRAZOLE SODIUM 40 MG PO TBEC: 40.0000 mg | DELAYED_RELEASE_TABLET | Freq: Every day | ORAL | 0 refills | Status: DC

## 2022-08-07 NOTE — Telephone Encounter (Signed)
Requested medication (s) are due for refill today:   No  Requested medication (s) are on the active medication list:   Yes  Future visit scheduled:   Yes with the pharmacist Lurena Joiner on 09/04/2022.   With Dr. Wynetta Emery in April    Last ordered: 07/17/2022 #14, 0 refills until seen at next appt.  Returned for provider review since her appt. With Dr. Wynetta Emery isn't until April but on 2/29 has an appt. With Lurena Joiner, pharmacist  Requested Prescriptions  Pending Prescriptions Disp Refills   pantoprazole (PROTONIX) 40 MG tablet 14 tablet 0    Sig: Take 1 tablet (40 mg total) by mouth daily.     Gastroenterology: Proton Pump Inhibitors Passed - 08/06/2022  5:03 PM      Passed - Valid encounter within last 12 months    Recent Outpatient Visits           2 days ago Essential hypertension   [place], MD   3 months ago Essential hypertension   [place], [place] L, RPH-CPP   4 months ago Chronic obstructive pulmonary disease, unspecified COPD type [facility])   [place], MD   7 months ago Essential hypertension   [place], MD   1 year ago Osteoarthritis of multiple joints, unspecified osteoarthritis type   [place], MD       Future Appointments             In 4 months Wynetta Emery Dalbert Batman, MD [place]

## 2022-08-26 ENCOUNTER — Telehealth: Payer: Self-pay | Admitting: Internal Medicine

## 2022-08-26 NOTE — Telephone Encounter (Signed)
Routing to PCP for review.

## 2022-08-26 NOTE — Telephone Encounter (Signed)
Pt called requesting that she have in home nursing care.  [facility].    [employer] is her insurance.  The order needs to be placed.  Pt  [phone]  or [phone]  [facility] # [phone]

## 2022-08-28 NOTE — Telephone Encounter (Signed)
Signed PCS request faxed to [facility]

## 2022-09-04 ENCOUNTER — Ambulatory Visit: Payer: 59 | Attending: Internal Medicine | Admitting: Pharmacist

## 2022-09-04 ENCOUNTER — Encounter: Payer: Self-pay | Admitting: Pharmacist

## 2022-09-04 VITALS — Temp 98.6°F | Ht 66.0 in | Wt 116.6 lb

## 2022-09-04 DIAGNOSIS — Z1159 Encounter for screening for other viral diseases: Secondary | ICD-10-CM

## 2022-09-04 DIAGNOSIS — Z0000 Encounter for general adult medical examination without abnormal findings: Secondary | ICD-10-CM | POA: Diagnosis not present

## 2022-09-04 NOTE — Progress Notes (Signed)
Subjective:   Anna Mcguire is a 67 y.o. female who presents for Medicare Annual (Subsequent) preventive examination.      Objective:    Today's Vitals   09/04/22 1038 09/04/22 1041  Temp: 98.6 F (37 C)   Weight: 116 lb 9.6 oz (52.9 kg)   Height: '5\' 6"'$  (1.676 m)   PainSc: 7  7   PainLoc: Knee    Body mass index is 18.82 kg/m.     09/04/2022   10:54 AM 03/02/2022    3:29 PM 03/16/2021    5:40 PM 01/13/2020    2:00 AM 01/12/2020    1:08 PM  Advanced Directives  Does Patient Have a Medical Advance Directive? No No No No No  Would patient like information on creating a medical advance directive? No - Patient declined No - Patient declined No - Patient declined No - Patient declined No - Patient declined    Current Medications (verified) Outpatient Encounter Medications as of 09/04/2022  Medication Sig   albuterol (PROVENTIL) (2.5 MG/3ML) 0.083% nebulizer solution inhale THE contents of 1 vial PER nebulizer EVERY 6 HOURS AS NEEDED FOR wheezing OR SHORTNESS OF BREATH   albuterol (VENTOLIN HFA) 108 (90 Base) MCG/ACT inhaler Inhale 2 puffs into the lungs every 6 (six) hours as needed for wheezing or shortness of breath.   atorvastatin (LIPITOR) 80 MG tablet TAKE 1 TABLET ('80mg'$ ) BY MOUTH DAILY   buPROPion ER (WELLBUTRIN SR) 100 MG 12 hr tablet Take 1 tablet (100 mg total) by mouth 2 (two) times daily.   diclofenac Sodium (VOLTAREN) 1 % GEL Apply 2 g topically 3 (three) times daily.   hydrALAZINE (APRESOLINE) 10 MG tablet Take 1 tablet (10 mg total) by mouth 3 (three) times daily.   irbesartan (AVAPRO) 75 MG tablet Take 1 tablet (75 mg total) by mouth daily.   mometasone-formoterol (DULERA) 100-5 MCG/ACT AERO Inhale 2 puffs into the lungs 2 (two) times daily.   pantoprazole (PROTONIX) 40 MG tablet Take 1 tablet (40 mg total) by mouth daily.   tiotropium (SPIRIVA HANDIHALER) 18 MCG inhalation capsule place ONE CAPSULE into inhaler AND inhale daily   triamcinolone cream  (KENALOG) 0.1 % Apply 1 Application topically 2 (two) times daily.   amLODipine (NORVASC) 10 MG tablet TAKE 1 TABLET BY MOUTH DAILY FOR blood pressure (Patient not taking: Reported on 09/04/2022)   loratadine (CLARITIN) 10 MG tablet Take 1 tablet (10 mg total) by mouth daily. (Patient not taking: Reported on 12/31/2021)   [DISCONTINUED] fluticasone (FLONASE) 50 MCG/ACT nasal spray Place 1 spray into both nostrils daily.   [DISCONTINUED] lisinopril-hydrochlorothiazide (PRINZIDE,ZESTORETIC) 20-12.5 MG per tablet Take 1 tablet by mouth daily.   [DISCONTINUED] lovastatin (MEVACOR) 20 MG tablet Take 20 mg by mouth daily at 6 PM.   No facility-administered encounter medications on file as of 09/04/2022.    Allergies (verified) Chantix [varenicline tartrate] and Penicillins   History: Past Medical History:  Diagnosis Date   Acid reflux    Arthritis    Hypertension    Past Surgical History:  Procedure Laterality Date   COLONOSCOPY WITH PROPOFOL N/A 06/07/2014   Procedure: COLONOSCOPY WITH PROPOFOL;  Surgeon: Arta Silence, MD;  Location: [facility];  Service: Endoscopy;  Laterality: N/A;   ESOPHAGOGASTRODUODENOSCOPY (EGD) WITH PROPOFOL N/A 06/07/2014   Procedure: ESOPHAGOGASTRODUODENOSCOPY (EGD) WITH PROPOFOL;  Surgeon: Arta Silence, MD;  Location: [facility];  Service: Endoscopy;  Laterality: N/A;   Family History  Problem Relation Age of Onset   Kidney failure Mother  Aneurysm Father    Social History   Socioeconomic History   Marital status: Single    Spouse name: Not on file   Number of children: Not on file   Years of education: Not on file   Highest education level: Not on file  Occupational History   Not on file  Tobacco Use   Smoking status: Every Day    Packs/day: 0.25    Years: 48.00    Total pack years: 12.00    Types: Cigarettes   Smokeless tobacco: Never   Tobacco comments:    3 times a week - 10/24/2020    Buproprion helps  Substance and Sexual Activity    Alcohol use: Yes    Comment: has an alcoholic beverage 3 days/week   Drug use: Yes    Types: Marijuana   Sexual activity: Not Currently  Other Topics Concern   Not on file  Social History Narrative   Retired   Investment banker, operational of Radio broadcast assistant Strain: Not on file  Food Insecurity: Not on file  Transportation Needs: Not on file  Physical Activity: Not on file  Stress: Not on file  Social Connections: Not on file    Tobacco Counseling Ready to quit: No Counseling given: Yes Tobacco comments: 3 times a week - 10/24/2020 Buproprion helps   Clinical Intake:  Pre-visit preparation completed: No  Pain : 0-10 Pain Score: 7  Pain Type: Chronic pain Pain Location: Knee Pain Orientation: Left Pain Descriptors / Indicators: Aching Pain Onset: More than a month ago Pain Frequency: Constant Pain Relieving Factors: Voltaren gel  Pain Relieving Factors: Voltaren gel  Diabetes: No  How often do you need to have someone help you when you read instructions, pamphlets, or other written materials from your doctor or pharmacy?: 1 - Never What is the last grade level you completed in school?: 11th  Diabetic? No  Interpreter Needed?: No  Information entered by :: [place]   Activities of Daily Living    09/04/2022   11:01 AM  In your present state of health, do you have any difficulty performing the following activities:  Hearing? 0  Vision? 1  Comment In the process of getting an eye doctor  Difficulty concentrating or making decisions? 0  Walking or climbing stairs? 1  Dressing or bathing? 0  Doing errands, shopping? 0  Comment Does not drive  Preparing Food and eating ? N  Using the Toilet? N  In the past six months, have you accidently leaked urine? N  Do you have problems with loss of bowel control? N  Comment Constipation - self medicates with tea  Managing your Medications? N  Managing your Finances? N  Housekeeping or managing your Housekeeping? N     Patient Care Team: Ladell Pier, MD as PCP - General (Internal Medicine)  Indicate any recent Medical Services you may have received from other than [facility] providers in the past year (date may be approximate).     Assessment:   This is a routine wellness examination for Anna Mcguire.  Hearing/Vision screen No results found.  Dietary issues and exercise activities discussed: Current Exercise Habits: The patient does not participate in regular exercise at present, Exercise limited by: orthopedic condition(s)   Goals Addressed   None   Depression Screen    09/04/2022   10:57 AM 08/05/2022    8:44 AM 04/03/2022   10:34 AM 06/25/2021    1:58 PM 05/28/2021    2:24 PM 04/13/2020  10:27 AM 01/30/2020    3:34 PM  PHQ 2/9 Scores  PHQ - 2 Score '2 4 4 4 4 4 1  '$ PHQ- 9 Score '6 12 12 14 16 16 9    '$ Fall Risk    09/04/2022   10:55 AM 04/03/2022   10:34 AM 12/31/2021    4:08 PM 06/25/2021    1:52 PM 05/28/2021    2:24 PM  Fall Risk   Falls in the past year? 1 0 1 1 0  Number falls in past yr: 1 0 1 1 0  Injury with Fall? 0 0 0 1 0  Risk for fall due to : History of fall(s);Impaired vision No Fall Risks Impaired mobility  No Fall Risks  Risk for fall due to: Comment No Ophthalmologist      Follow up Falls evaluation completed;Falls prevention discussed;Education provided  Falls evaluation completed      FALL RISK PREVENTION PERTAINING TO THE HOME:  Any stairs in or around the home? No  If so, are there any without handrails? No  Home free of loose throw rugs in walkways, pet beds, electrical cords, etc? Yes  Adequate lighting in your home to reduce risk of falls? Yes   ASSISTIVE DEVICES UTILIZED TO PREVENT FALLS:  Life alert? No  Use of a cane, walker or w/c? Yes  Grab bars in the bathroom? Yes  Shower chair or bench in shower? No  - requests order  Elevated toilet seat or a handicapped toilet? No   TIMED UP AND GO:  Was the test performed? Yes .  Length of time to  ambulate 10 feet: 10 sec.   Gait slow and steady with assistive device  Cognitive Function:    09/04/2022   11:03 AM  MMSE - Mini Mental State Exam  Orientation to time 5  Orientation to Place 5  Registration 3  Attention/ Calculation 5  Recall 2  Language- name 2 objects 2  Language- repeat 1  Language- follow 3 step command 3  Language- read & follow direction 1  Write a sentence 1  Copy design 1  Total score 29        Immunizations Immunization History  Administered Date(s) Administered   Influenza,inj,Quad PF,6+ Mos 04/13/2020, 03/20/2021, 04/03/2022   Moderna Sars-Covid-2 Vaccination 11/04/2019, 12/02/2019   PNEUMOCOCCAL CONJUGATE-20 03/20/2021   Tdap 06/25/2021    TDAP status: Up to date  Flu Vaccine status: Up to date  Pneumococcal vaccine status: Up to date  Covid-19 vaccine status: Declined, Education has been provided regarding the importance of this vaccine but patient still declined. Advised may receive this vaccine at local pharmacy or Health Dept.or vaccine clinic. Aware to provide a copy of the vaccination record if obtained from local pharmacy or Health Dept. Verbalized acceptance and understanding.  Qualifies for Shingles Vaccine? Yes   Zostavax completed No   Shingrix Completed?: Yes - tells me she received at [facility]. Will call and verify this for our records.   Screening Tests Health Maintenance  Topic Date Due   Hepatitis C Screening  Never done   Lung Cancer Screening  Never done   Zoster Vaccines- Shingrix (1 of 2) Never done   DEXA SCAN  Never done   MAMMOGRAM  03/21/2021   COVID-19 Vaccine (3 - 2023-24 season) 09/20/2022 (Originally 03/07/2022)   Medicare Annual Wellness (AWV)  09/04/2023   COLONOSCOPY (Pts 45-33yr Insurance coverage will need to be confirmed)  06/07/2024   DTaP/Tdap/Td (2 - Td or Tdap) 06/26/2031  Pneumonia Vaccine 34+ Years old  Completed   INFLUENZA VACCINE  Completed   HPV VACCINES  Aged Out    Health  Maintenance  Health Maintenance Due  Topic Date Due   Hepatitis C Screening  Never done   Lung Cancer Screening  Never done   Zoster Vaccines- Shingrix (1 of 2) Never done   DEXA SCAN  Never done   MAMMOGRAM  03/21/2021    Colorectal cancer screening: Type of screening: Colonoscopy. Completed 2015. Repeat every 10 years  Mammogram status: Ordered 08/05/2022. Pt provided with contact info and advised to call to schedule appt.   Bone Density status: Ordered 08/05/2022. Pt provided with contact info and advised to call to schedule appt.  Lung Cancer Screening: (Low Dose CT Chest recommended if Age 37-80 years, 30 pack-year currently smoking OR have quit w/in 15years.) does qualify.   Lung Cancer Screening Referral: message sent to PCP  Additional Screening:  Hepatitis C Screening: does qualify; Completed today.  Vision Screening: Recommended annual ophthalmology exams for early detection of glaucoma and other disorders of the eye. Is the patient up to date with their annual eye exam?  No  Who is the provider or what is the name of the office in which the patient attends annual eye exams? Does not have one, requests referral.  If pt is not established with a provider, would they like to be referred to a provider to establish care? Yes .   Dental Screening: Recommended annual dental exams for proper oral hygiene  Community Resource Referral / Chronic Care Management: CRR required this visit?  No   CCM required this visit?  No      Plan:     I have personally reviewed and noted the following in the patient's chart:   Medical and social history Use of alcohol, tobacco or illicit drugs  Current medications and supplements including opioid prescriptions. Patient is not currently taking opioid prescriptions. Functional ability and status Nutritional status Physical activity Advanced directives List of other physicians Hospitalizations, surgeries, and ER visits in previous 12  months Vitals Screenings to include cognitive, depression, and falls Referrals and appointments  In addition, I have reviewed and discussed with patient certain preventive protocols, quality metrics, and best practice recommendations. A written personalized care plan for preventive services as well as general preventive health recommendations were provided to patient.     Tresa Endo, RPH-CPP   09/04/2022

## 2022-09-05 LAB — HCV AB W REFLEX TO QUANT PCR: HCV Ab: NONREACTIVE

## 2022-09-05 LAB — HCV INTERPRETATION

## 2022-09-10 ENCOUNTER — Other Ambulatory Visit: Payer: Self-pay | Admitting: Internal Medicine

## 2022-09-10 ENCOUNTER — Telehealth: Payer: Self-pay | Admitting: Internal Medicine

## 2022-09-10 DIAGNOSIS — Z122 Encounter for screening for malignant neoplasm of respiratory organs: Secondary | ICD-10-CM

## 2022-09-10 DIAGNOSIS — E785 Hyperlipidemia, unspecified: Secondary | ICD-10-CM

## 2022-09-10 DIAGNOSIS — M1712 Unilateral primary osteoarthritis, left knee: Secondary | ICD-10-CM

## 2022-09-10 DIAGNOSIS — F320 Major depressive disorder, single episode, mild: Secondary | ICD-10-CM

## 2022-09-10 DIAGNOSIS — F17200 Nicotine dependence, unspecified, uncomplicated: Secondary | ICD-10-CM

## 2022-09-10 DIAGNOSIS — Z0100 Encounter for examination of eyes and vision without abnormal findings: Secondary | ICD-10-CM

## 2022-09-10 NOTE — Telephone Encounter (Signed)
Rx request is too soon, next refill due in April.  Requested Prescriptions  Pending Prescriptions Disp Refills   atorvastatin (LIPITOR) 80 MG tablet [Pharmacy Med Name: atorvastatin 80 mg tablet] 90 tablet 1    Sig: TAKE 1 Tablet BY MOUTH ONCE DAILY     Cardiovascular:  Antilipid - Statins Failed - 09/10/2022 12:44 PM      Failed - Lipid Panel in normal range within the last 12 months    Cholesterol, Total  Date Value Ref Range Status  03/20/2021 155 100 - 199 mg/dL Final   LDL Chol Calc (NIH)  Date Value Ref Range Status  03/20/2021 35 0 - 99 mg/dL Final   HDL  Date Value Ref Range Status  03/20/2021 82 >39 mg/dL Final   Triglycerides  Date Value Ref Range Status  03/20/2021 258 (H) 0 - 149 mg/dL Final         Passed - Patient is not pregnant      Passed - Valid encounter within last 12 months    Recent Outpatient Visits           6 days ago Need for hepatitis C screening test   [place], RPH-CPP   1 month ago Essential hypertension   [place], MD   4 months ago Essential hypertension   [place], [place] L, RPH-CPP   5 months ago Chronic obstructive pulmonary disease, unspecified COPD type [facility])   [place] Ladell Pier, MD   8 months ago Essential hypertension   [place], MD       Future Appointments             In 2 months Wynetta Emery, Dalbert Batman, MD [place]

## 2022-09-10 NOTE — Telephone Encounter (Signed)
-----   Message from Tresa Endo, RPH-CPP sent at 09/04/2022  4:50 PM EST ----- Hey  Dr. Wynetta Emery,   I saw this patient today for her AWV and wanted to follow-up regarding the following:   -Are you able to write for a walker for her? She is unstable with her cane and tells me she has a couple of falls without injury this past year.  -She requests a shower chair for the above reason. She has longstanding, chronic pain in her R knee.   Other referrals:  -Would like a referral to an ophthalmologist.  -Her PHQ-9 has been elevated. She requested psychiatry referral. Of note, bupropion is listed on her profile but she uses in the setting of tobacco cessation.  -Qualifies for CT-chest/lung CA screen. Requests referral.   I was able to call and update her Shingrix on HM and order her Hep C screen.  Thank you for including me!   Lurena Joiner

## 2022-09-15 ENCOUNTER — Telehealth: Payer: Self-pay | Admitting: Emergency Medicine

## 2022-09-15 NOTE — Telephone Encounter (Signed)
Copied from [place] [id number]. Topic: General - Inquiry >> Sep 15, 2022  1:19 PM Erskine Squibb wrote: Reason for CRM: Helene Kelp with [employer] called in informing the provider she sent in a faxed request asking for additional information as to why the patient would need a shower chair. Please assist further by faxing information to [phone].

## 2022-09-16 ENCOUNTER — Encounter: Payer: Self-pay | Admitting: Dermatology

## 2022-09-17 NOTE — Telephone Encounter (Signed)
Forms and requested information successfully faxed to [place] on 09/17/2022.

## 2022-09-18 DIAGNOSIS — M1712 Unilateral primary osteoarthritis, left knee: Secondary | ICD-10-CM | POA: Diagnosis not present

## 2022-09-22 ENCOUNTER — Ambulatory Visit (INDEPENDENT_AMBULATORY_CARE_PROVIDER_SITE_OTHER): Payer: 59 | Admitting: Podiatry

## 2022-09-22 DIAGNOSIS — Z91199 Patient's noncompliance with other medical treatment and regimen due to unspecified reason: Secondary | ICD-10-CM

## 2022-09-22 NOTE — Progress Notes (Signed)
1. No-show for appointment     

## 2022-09-29 ENCOUNTER — Other Ambulatory Visit: Payer: Self-pay | Admitting: Internal Medicine

## 2022-09-29 DIAGNOSIS — R109 Unspecified abdominal pain: Secondary | ICD-10-CM

## 2022-09-29 DIAGNOSIS — M1712 Unilateral primary osteoarthritis, left knee: Secondary | ICD-10-CM

## 2022-10-07 ENCOUNTER — Other Ambulatory Visit: Payer: Self-pay | Admitting: Internal Medicine

## 2022-10-07 DIAGNOSIS — K219 Gastro-esophageal reflux disease without esophagitis: Secondary | ICD-10-CM

## 2022-10-07 NOTE — Telephone Encounter (Signed)
Requested Prescriptions  Pending Prescriptions Disp Refills   pantoprazole (PROTONIX) 40 MG tablet [Pharmacy Med Name: pantoprazole 40 mg tablet,delayed release] 90 tablet 3    Sig: TAKE 1 Tablet BY MOUTH ONCE DAILY     Gastroenterology: Proton Pump Inhibitors Passed - 10/07/2022 11:30 AM      Passed - Valid encounter within last 12 months    Recent Outpatient Visits           1 month ago Need for hepatitis C screening test   [place], Jarome [place], RPH-CPP   2 months ago Essential hypertension   [place], MD   5 months ago Essential hypertension   [place], [place] L, RPH-CPP   6 months ago Chronic obstructive pulmonary disease, unspecified COPD type [facility])   [place] Ladell Pier, MD   9 months ago Essential hypertension   [place], MD       Future Appointments             In 1 month Wynetta Emery, Dalbert Batman, MD [place]

## 2022-10-08 ENCOUNTER — Other Ambulatory Visit: Payer: Self-pay | Admitting: Internal Medicine

## 2022-10-08 DIAGNOSIS — I10 Essential (primary) hypertension: Secondary | ICD-10-CM

## 2022-10-21 ENCOUNTER — Ambulatory Visit: Payer: 59

## 2022-10-24 ENCOUNTER — Other Ambulatory Visit: Payer: Self-pay

## 2022-10-24 ENCOUNTER — Emergency Department (HOSPITAL_COMMUNITY)
Admission: EM | Admit: 2022-10-24 | Discharge: 2022-10-24 | Disposition: A | Discharge status: Home / Self Care | Payer: 59 | Attending: Emergency Medicine | Admitting: Emergency Medicine

## 2022-10-24 ENCOUNTER — Emergency Department (HOSPITAL_COMMUNITY): Payer: 59

## 2022-10-24 ENCOUNTER — Encounter (HOSPITAL_COMMUNITY): Payer: Self-pay

## 2022-10-24 DIAGNOSIS — Y92002 Bathroom of unspecified non-institutional (private) residence single-family (private) house as the place of occurrence of the external cause: Secondary | ICD-10-CM | POA: Insufficient documentation

## 2022-10-24 DIAGNOSIS — W1830XA Fall on same level, unspecified, initial encounter: Secondary | ICD-10-CM | POA: Diagnosis not present

## 2022-10-24 DIAGNOSIS — Z79899 Other long term (current) drug therapy: Secondary | ICD-10-CM | POA: Insufficient documentation

## 2022-10-24 DIAGNOSIS — M25551 Pain in right hip: Secondary | ICD-10-CM | POA: Diagnosis not present

## 2022-10-24 DIAGNOSIS — N189 Chronic kidney disease, unspecified: Secondary | ICD-10-CM | POA: Insufficient documentation

## 2022-10-24 DIAGNOSIS — S7001XA Contusion of right hip, initial encounter: Secondary | ICD-10-CM | POA: Insufficient documentation

## 2022-10-24 DIAGNOSIS — I129 Hypertensive chronic kidney disease with stage 1 through stage 4 chronic kidney disease, or unspecified chronic kidney disease: Secondary | ICD-10-CM | POA: Diagnosis not present

## 2022-10-24 DIAGNOSIS — Z043 Encounter for examination and observation following other accident: Secondary | ICD-10-CM | POA: Diagnosis not present

## 2022-10-24 MED ORDER — METHOCARBAMOL 500 MG PO TABS
500.0000 mg | ORAL_TABLET | Freq: Once | ORAL | Status: AC
  Administered 2022-10-24: 500 mg via ORAL
  Filled 2022-10-24: qty 1

## 2022-10-24 MED ORDER — METHOCARBAMOL 500 MG PO TABS: 500.0000 mg | ORAL_TABLET | Freq: Three times a day (TID) | ORAL | 0 refills | Status: DC | PRN

## 2022-10-24 NOTE — ED Notes (Signed)
Sister Myrtis Hopping [phone] would like an update asap and for her sister to call her immediately please

## 2022-10-24 NOTE — ED Provider Triage Note (Signed)
Emergency Medicine Provider Triage Evaluation Note  Anna Mcguire , a 67 y.o. female  was evaluated in triage.  Pt complains of right buttock pain and left flank pain following a mechanical fall.  Patient fell at approximately 0400 today from standing height.  Denies LOC or head injury.  Patient woke around 0800 with pain and took 2 ibuprofen, which "helped a little bit".  Denies blood thinner use.    Review of Systems  Positive: As above Negative: As above  Physical Exam  BP (!) 165/76 (BP Location: Right Arm)   Pulse 81   Temp 99.6 F (37.6 C) (Oral)   Resp 18   Ht  (1.676 m)   Wt 54.4 kg   SpO2 100%   BMI 19.37 kg/m  Gen:   Awake, no distress   Resp:  Normal effort  MSK:   Moves extremities without difficulty  Other:    Medical Decision Making  Medically screening exam initiated at 2:18 PM.  Appropriate orders placed.  Marcy Salvo was informed that the remainder of the evaluation will be completed by another provider, this initial triage assessment does not replace that evaluation, and the importance of remaining in the ED until their evaluation is complete.     Melton Alar R, PA-C 10/24/22 1423

## 2022-10-24 NOTE — ED Triage Notes (Signed)
PT arrived to ED via POV, d/t fall at approx 0400 this morning from standing height. Denies LOC or injuring head. No dizziness, N/V or photosensitivity. Attempted to go back to bed and woke up at 0800, complains of swelling and pain in R buttock rated 8/10 pain, and L flank rated 6/10 pain. States they took '2 ibuprofen that helped a little' before coming in to ED. Aox4.

## 2022-10-24 NOTE — Discharge Instructions (Addendum)
As we discussed, your x-ray did not show any fractures.  You can continue to take ibuprofen as needed  Please take Robaxin 500 mg 3 times daily as needed  See your doctor for follow-up  Return to ER if you have worse hip pain or unable to walk

## 2022-10-24 NOTE — ED Provider Notes (Signed)
[place] EMERGENCY DEPARTMENT AT [facility] Provider Note   CSN: [id number] Arrival date & time: 10/24/22  1239     History  Chief Complaint  Patient presents with   Right buttock pain    Anna Mcguire is a 67 y.o. female history of arthritis, CKD, hypertension here presenting with fall.  Patient states that she woke up around 4 AM and try to go to the bathroom.  She states that the bathroom was wet and she had a mechanical fall onto the right hip.  She states that she is able to walk afterwards.  She notices bruising of the right hip.  She did take some ibuprofen with some relief.  Patient walks with a cane at baseline.  Denies any head injury or loss of consciousness  HPI     Home Medications Prior to Admission medications   Medication Sig Start Date End Date Taking? Authorizing Provider  albuterol (PROVENTIL) (2.5 MG/3ML) 0.083% nebulizer solution inhale THE contents of 1 vial PER nebulizer EVERY 6 HOURS AS NEEDED FOR wheezing OR SHORTNESS OF BREATH 12/31/21   Marcine Matar, MD  albuterol (VENTOLIN HFA) 108 (90 Base) MCG/ACT inhaler Inhale 2 puffs into the lungs every 6 (six) hours as needed for wheezing or shortness of breath. 12/31/21   Marcine Matar, MD  amLODipine (NORVASC) 10 MG tablet TAKE 1 TABLET BY MOUTH DAILY FOR blood pressure Patient not taking: Reported on 09/04/2022 08/05/22   Marcine Matar, MD  atorvastatin (LIPITOR) 80 MG tablet TAKE 1 TABLET ( ) BY MOUTH DAILY 04/23/22   Marcine Matar, MD  buPROPion ER [facility] SR) 100 MG 12 hr tablet Take 1 tablet (100 mg total) by mouth 2 (two) times daily. 08/05/22   Marcine Matar, MD  diclofenac Sodium (VOLTAREN) 1 % GEL APPLY 2 GRAMS TOPICALLY TO THE AFFECTED AREA THREE TIMES DAILY 09/30/22   Marcine Matar, MD  hydrALAZINE (APRESOLINE) 10 MG tablet TAKE 1 Tablet BY MOUTH THREE TIMES DAILY 10/08/22   Marcine Matar, MD  irbesartan (AVAPRO) 75 MG tablet Take 1 tablet (75 mg  total) by mouth daily. 08/05/22   Marcine Matar, MD  loratadine (CLARITIN) 10 MG tablet Take 1 tablet (10 mg total) by mouth daily. Patient not taking: Reported on 12/31/2021 03/19/21   Marcine Matar, MD  mometasone-formoterol [facility]) 100-5 MCG/ACT AERO Inhale 2 puffs into the lungs 2 (two) times daily. 08/05/22   Marcine Matar, MD  pantoprazole (PROTONIX) 40 MG tablet TAKE 1 Tablet BY MOUTH ONCE DAILY 10/07/22   Marcine Matar, MD  tiotropium (SPIRIVA HANDIHALER) 18 MCG inhalation capsule place ONE CAPSULE into inhaler AND inhale daily 08/05/22   Marcine Matar, MD  triamcinolone cream (KENALOG) 0.1 % Apply 1 Application topically 2 (two) times daily. 08/05/22   Marcine Matar, MD  fluticasone (FLONASE) 50 MCG/ACT nasal spray Place 1 spray into both nostrils daily.  05/12/19  [provider]  lisinopril-hydrochlorothiazide (PRINZIDE,ZESTORETIC) 20-12.5 MG per tablet Take 1 tablet by mouth daily.  05/12/19  [provider]  lovastatin (MEVACOR) 20 MG tablet Take 20 mg by mouth daily at 6 PM.  05/12/19  [provider]      Allergies    Chantix [varenicline tartrate] and Penicillins    Review of Systems   Review of Systems  Musculoskeletal:        Right hip pain  All other systems reviewed and are negative.   Physical Exam Updated Vital  Signs BP (!) 141/72 (BP Location: Right Arm)   Pulse 78   Temp 98.2 F (36.8 C) (Oral)   Resp 18   Ht  (1.676 m)   Wt 54.4 kg   SpO2 99%   BMI 19.37 kg/m  Physical Exam Vitals and nursing note reviewed.  Constitutional:      Appearance: Normal appearance.  HENT:     Head: Normocephalic and atraumatic.     Comments: No obvious scalp hematoma    Nose: Nose normal.     Mouth/Throat:     Mouth: Mucous membranes are moist.  Eyes:     Extraocular Movements: Extraocular movements intact.     Pupils: Pupils are equal, round, and reactive to light.  Cardiovascular:     Rate and Rhythm: Normal rate  and regular rhythm.     Heart sounds: Normal heart sounds.  Pulmonary:     Effort: Pulmonary effort is normal.     Breath sounds: Normal breath sounds.  Abdominal:     General: Abdomen is flat.     Palpations: Abdomen is soft.  Musculoskeletal:     Cervical back: Normal range of motion and neck supple.     Comments: Contusion of the right hip.  Normal range of motion of the hip and patient is able to bear weight on the hip.  Skin:    General: Skin is warm.     Capillary Refill: Capillary refill takes less than 2 seconds.  Neurological:     General: No focal deficit present.     Mental Status: She is alert and oriented to person, place, and time.  Psychiatric:        Mood and Affect: Mood normal.        Behavior: Behavior normal.     ED Results / Procedures / Treatments   Labs (all labs ordered are listed, but only abnormal results are displayed) Labs Reviewed - No data to display  EKG None  Radiology DG Ribs Unilateral W/Chest Left  Result Date: 10/24/2022 CLINICAL DATA:  Fall this morning.  Left flank pain. EXAM: LEFT RIBS AND CHEST - 3+ VIEW COMPARISON:  Chest radiographs 03/02/2022 and 01/12/2020. FINDINGS: The heart size and mediastinal contours are stable. The lungs are clear. There is no pleural effusion or pneumothorax. No acute left-sided rib fractures are seen. There is an old healed fracture of the left 5th rib laterally. Lumbar spondylosis noted. IMPRESSION: No evidence of acute left-sided rib fracture, pleural effusion or pneumothorax. Old left 5th rib fracture. Electronically Signed   By: Carey Bullocks M.D.   On: 10/24/2022 15:04   DG Hip Unilat W or Wo Pelvis 2-3 Views Right  Result Date: 10/24/2022 CLINICAL DATA:  Right buttock pain after falling this morning. EXAM: DG HIP (WITH OR WITHOUT PELVIS) 2-3V RIGHT COMPARISON:  Radiographs 04/14/2020. FINDINGS: The bones appear adequately mineralized. There is no evidence of acute fracture or dislocation. No evidence  of femoral head osteonecrosis. Minimal degenerative changes of the hips and sacroiliac joints. There are more advanced degenerative changes in the lower lumbar spine. Prominent vascular calcifications are noted. IMPRESSION: No evidence of acute right hip fracture or dislocation. Degenerative changes in the lower lumbar spine. Electronically Signed   By: Carey Bullocks M.D.   On: 10/24/2022 14:58    Procedures Procedures    Medications Ordered in ED Medications  methocarbamol (ROBAXIN) tablet 500 mg (has no administration in time range)    ED Course/ Medical Decision Making/ A&P  Medical Decision Making Anna Mcguire is a 67 y.o. female here presenting with right hip pain after fall.  Clinically patient has a hip contusion.  X-ray did not show any fracture.  Patient took ibuprofen at home.  Will give Robaxin as needed.  At this point she is stable for discharge.  Her sister requested update and I tried to call her multiple times but her phone is disconnected.   Problems Addressed: Contusion of right hip, initial encounter: acute illness or injury  Amount and/or Complexity of Data Reviewed Radiology: ordered and independent interpretation performed. Decision-making details documented in ED Course.  Risk Prescription drug management.    Final Clinical Impression(s) / ED Diagnoses Final diagnoses:  None    Rx / DC Orders ED Discharge Orders     None         Charlynne Pander, MD 10/24/22 1921

## 2022-10-27 ENCOUNTER — Ambulatory Visit: Payer: 59

## 2022-10-31 ENCOUNTER — Other Ambulatory Visit: Payer: Self-pay | Admitting: Internal Medicine

## 2022-10-31 DIAGNOSIS — F17200 Nicotine dependence, unspecified, uncomplicated: Secondary | ICD-10-CM

## 2022-10-31 DIAGNOSIS — I10 Essential (primary) hypertension: Secondary | ICD-10-CM

## 2022-10-31 NOTE — Telephone Encounter (Signed)
Unable to refill per protocol, Rx request is too soon.  Requested Prescriptions  Pending Prescriptions Disp Refills   amLODipine (NORVASC) 10 MG tablet [Pharmacy Med Name: amlodipine 10 mg tablet] 90 tablet 1    Sig: TAKE 1 Tablet BY MOUTH ONCE DAILY FOR BLOOD PRESSURE     Cardiovascular: Calcium Channel Blockers 2 Failed - 10/31/2022  2:54 PM      Failed - Last BP in normal range    BP Readings from Last 1 Encounters:  10/24/22 (!) 153/73         Passed - Last Heart Rate in normal range    Pulse Readings from Last 1 Encounters:  10/24/22 75         Passed - Valid encounter within last 6 months    Recent Outpatient Visits           1 month ago Need for hepatitis C screening test   [facility] [place], Cornelius Moras, RPH-CPP   2 months ago Essential hypertension   [facility] Jonah Blue B, MD   5 months ago Essential hypertension   [facility] [place], Cornelius Moras, RPH-CPP   7 months ago Chronic obstructive pulmonary disease, unspecified COPD type [facility])   [facility] Jonah Blue B, MD   10 months ago Essential hypertension   [facility] Marcine Matar, MD       Future Appointments             In 1 month Marcine Matar, MD [facility]             buPROPion ER [facility] SR) 100 MG 12 hr tablet [Pharmacy Med Name: bupropion HCl SR 100 mg tablet,12 hr sustained-release] 60 tablet 6    Sig: TAKE 1 Tablet BY MOUTH TWICE DAILY     Psychiatry: Antidepressants - bupropion Failed - 10/31/2022  2:54 PM      Failed - Cr in normal range and within 360 days    Creatinine, Ser  Date Value Ref Range Status  08/05/2022 1.08 (H) 0.57 - 1.00 mg/dL Final         Failed - Last BP in normal range    BP Readings from Last 1 Encounters:  10/24/22 (!)  153/73         Passed - AST in normal range and within 360 days    AST  Date Value Ref Range Status  03/02/2022 27 15 - 41 U/L Final         Passed - ALT in normal range and within 360 days    ALT  Date Value Ref Range Status  03/02/2022 25 0 - 44 U/L Final         Passed - Valid encounter within last 6 months    Recent Outpatient Visits           1 month ago Need for hepatitis C screening test   [facility] Drucilla Chalet, RPH-CPP   2 months ago Essential hypertension   [facility] Marcine Matar, MD   5 months ago Essential hypertension   [facility] [place], Rossiter L, RPH-CPP   7 months ago Chronic obstructive pulmonary disease, unspecified COPD type (HCC)  [facility] [place] [facility] Marcine Matar, MD   10 months ago Essential hypertension    [facility] Marcine Matar, MD       Future Appointments             In 1 month Laural Benes Binnie Rail, MD [facility]

## 2022-11-03 ENCOUNTER — Encounter: Payer: Self-pay | Admitting: Podiatry

## 2022-11-03 ENCOUNTER — Ambulatory Visit (INDEPENDENT_AMBULATORY_CARE_PROVIDER_SITE_OTHER): Payer: 59 | Admitting: Podiatry

## 2022-11-03 VITALS — BP 143/65

## 2022-11-03 DIAGNOSIS — B351 Tinea unguium: Secondary | ICD-10-CM

## 2022-11-03 DIAGNOSIS — M79674 Pain in right toe(s): Secondary | ICD-10-CM | POA: Diagnosis not present

## 2022-11-03 DIAGNOSIS — L84 Corns and callosities: Secondary | ICD-10-CM

## 2022-11-03 DIAGNOSIS — M79675 Pain in left toe(s): Secondary | ICD-10-CM

## 2022-11-03 NOTE — Progress Notes (Unsigned)
  Subjective:  Patient ID: Anna Mcguire, female    DOB: 03/30/1956,  MRN: [id number]  Anna Mcguire presents to clinic today for {jgcomplaint:23593}  Chief Complaint  Patient presents with   Nail Problem    [facility] PCP-Deborah Johnson PCP VST- 2 months ago   New problem(s): None. {jgcomplaint:23593}  PCP is Marcine Matar, MD.  Allergies  Allergen Reactions   Chantix [Varenicline Tartrate]     Diarrhea and dizziness   Penicillins Diarrhea    Review of Systems: Negative except as noted in the HPI.  Objective: No changes noted in today's physical examination. Vitals:   11/03/22 1340  BP: (!) 143/65   Anna Mcguire is a pleasant 67 y.o. female {jgbodyhabitus:24098} AAO x 3.  Vascular Capillary refill time to digits immediate b/l. Palpable pedal pulses b/l LE. Pedal hair sparse. Lower extremity skin temperature gradient within normal limits. No pain with calf compression b/l. No edema noted b/l lower extremities.  No cyanosis or clubbing noted.  Neurologic Normal speech. Oriented to person, place, and time. Protective sensation intact 5/5 intact bilaterally with 10g monofilament b/l. Vibratory sensation intact b/l. Proprioception intact bilaterally.  Dermatologic Pedal skin with normal turgor, texture and tone bilaterally. No open wounds bilaterally. No interdigital macerations bilaterally.   Toenails 1-5 b/l elongated, discolored, dystrophic, thickened, crumbly with subungual debris and tenderness to dorsal palpation.   Anonychia noted L 2nd toe. Nailbed(s) epithelialized.    Diffuse scaling noted peripherally and plantarly b/l feet.  No interdigital macerations.  No blisters, no weeping. No signs of secondary bacterial infection noted.  Hyperkeratotic lesion(s) R 3rd toe and submet head 5 right foot.  No erythema, no edema, no drainage, no fluctuance.  Orthopedic: Normal muscle strength 5/5 to all lower extremity muscle groups bilaterally. Pain on  palpation submet head 1 left foot. No erythema, no edema, no ecchymosis. Hallux valgus with bunion deformity noted b/l lower extremities. Hammertoes noted to the b/l lower extremities.   Assessment/Plan: No diagnosis found.  No orders of the defined types were placed in this encounter.   None {Jgplan:23602::"-Patient/POA to call should there be question/concern in the interim."}   Return in about 3 months (around 02/02/2023).  Freddie Breech, DPM

## 2022-11-05 ENCOUNTER — Other Ambulatory Visit: Payer: Self-pay | Admitting: Internal Medicine

## 2022-11-05 DIAGNOSIS — J449 Chronic obstructive pulmonary disease, unspecified: Secondary | ICD-10-CM

## 2022-11-26 ENCOUNTER — Other Ambulatory Visit: Payer: Self-pay | Admitting: Internal Medicine

## 2022-11-26 DIAGNOSIS — M1712 Unilateral primary osteoarthritis, left knee: Secondary | ICD-10-CM

## 2022-11-26 DIAGNOSIS — R109 Unspecified abdominal pain: Secondary | ICD-10-CM

## 2022-11-26 DIAGNOSIS — J449 Chronic obstructive pulmonary disease, unspecified: Secondary | ICD-10-CM

## 2022-12-02 ENCOUNTER — Other Ambulatory Visit: Payer: Self-pay | Admitting: Internal Medicine

## 2022-12-02 DIAGNOSIS — E785 Hyperlipidemia, unspecified: Secondary | ICD-10-CM

## 2022-12-02 DIAGNOSIS — I10 Essential (primary) hypertension: Secondary | ICD-10-CM

## 2022-12-05 ENCOUNTER — Encounter: Payer: Self-pay | Admitting: Internal Medicine

## 2022-12-05 ENCOUNTER — Ambulatory Visit: Payer: 59 | Attending: Internal Medicine | Admitting: Internal Medicine

## 2022-12-05 VITALS — BP 125/58 | HR 84 | Ht 66.0 in | Wt 117.4 lb

## 2022-12-05 DIAGNOSIS — D649 Anemia, unspecified: Secondary | ICD-10-CM

## 2022-12-05 DIAGNOSIS — M1812 Unilateral primary osteoarthritis of first carpometacarpal joint, left hand: Secondary | ICD-10-CM | POA: Diagnosis not present

## 2022-12-05 DIAGNOSIS — I10 Essential (primary) hypertension: Secondary | ICD-10-CM

## 2022-12-05 DIAGNOSIS — M12812 Other specific arthropathies, not elsewhere classified, left shoulder: Secondary | ICD-10-CM

## 2022-12-05 DIAGNOSIS — F320 Major depressive disorder, single episode, mild: Secondary | ICD-10-CM | POA: Diagnosis not present

## 2022-12-05 DIAGNOSIS — K921 Melena: Secondary | ICD-10-CM | POA: Diagnosis not present

## 2022-12-05 DIAGNOSIS — K219 Gastro-esophageal reflux disease without esophagitis: Secondary | ICD-10-CM

## 2022-12-05 DIAGNOSIS — J449 Chronic obstructive pulmonary disease, unspecified: Secondary | ICD-10-CM | POA: Diagnosis not present

## 2022-12-05 DIAGNOSIS — F419 Anxiety disorder, unspecified: Secondary | ICD-10-CM

## 2022-12-05 MED ORDER — PANTOPRAZOLE SODIUM 40 MG PO TBEC: DELAYED_RELEASE_TABLET | ORAL | 1 refills | Status: DC

## 2022-12-05 MED ORDER — SERTRALINE HCL 25 MG PO TABS
25.0000 mg | ORAL_TABLET | Freq: Every day | ORAL | 3 refills | Status: DC
Start: 2022-12-05 — End: 2022-12-30

## 2022-12-05 NOTE — Patient Instructions (Signed)
Stop Ibuprofen, Motrin, BC, Aleve, Advil and/or Naprosyn.  Okay to use Tylenol. I have submitted a referral for you to see the orthopedic specialist Dr. Cleophas Dunker.  They will call you with that appointment.  Stop the bupropion.  We have placed you on a medication called Zoloft 25 mg daily instead for the depression and anxiety.  Increase pantoprazole to 1 tablet twice a day for 1 month then back to 1 tablet daily. We have referred you to the gastroenterologist.
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
## 2022-12-06 ENCOUNTER — Encounter: Payer: Self-pay | Admitting: Internal Medicine

## 2022-12-06 ENCOUNTER — Other Ambulatory Visit: Payer: Self-pay | Admitting: Internal Medicine

## 2022-12-06 LAB — CBC
Hematocrit: 28.7 % — ABNORMAL LOW (ref 34.0–46.6)
Hemoglobin: 9.6 g/dL — ABNORMAL LOW (ref 11.1–15.9)
MCH: 32.4 pg (ref 26.6–33.0)
MCHC: 33.4 g/dL (ref 31.5–35.7)
MCV: 97 fL (ref 79–97)
Platelets: 365 10*3/uL (ref 150–450)
RBC: 2.96 x10E6/uL — ABNORMAL LOW (ref 3.77–5.28)
RDW: 12.1 % (ref 11.7–15.4)
WBC: 7.1 10*3/uL (ref 3.4–10.8)

## 2022-12-06 LAB — IRON,TIBC AND FERRITIN PANEL
Ferritin: 92 ng/mL (ref 15–150)
Iron Saturation: 19 % (ref 15–55)
Iron: 59 ug/dL (ref 27–139)
Total Iron Binding Capacity: 309 ug/dL (ref 250–450)
UIBC: 250 ug/dL (ref 118–369)

## 2022-12-06 MED ORDER — FERROUS SULFATE 325 (65 FE) MG PO TABS: 325.0000 mg | ORAL_TABLET | Freq: Every day | ORAL | 1 refills | Status: DC

## 2022-12-09 ENCOUNTER — Ambulatory Visit: Payer: 59 | Admitting: Nurse Practitioner

## 2022-12-10 ENCOUNTER — Other Ambulatory Visit: Payer: Self-pay | Admitting: Internal Medicine

## 2022-12-10 DIAGNOSIS — E785 Hyperlipidemia, unspecified: Secondary | ICD-10-CM

## 2022-12-22 ENCOUNTER — Telehealth: Payer: Self-pay

## 2022-12-22 NOTE — Telephone Encounter (Signed)
Pt called for lab results. Called identified herself as pt. Later in encounter caller asked pt questions. Caller stated that pt was right there, but was holding the baby. Pt states she has been taking iron pills since the 10th.  Marcine Matar, MD 12/06/2022  1:15 PM EDT     Let patient know that she is still anemic.  Iron studies look like she is moving towards iron deficiency again.  I recommend taking iron supplement daily.  Prescription has been sent to her pharmacy for ferrous sulfate.  If this is not covered by her insurance, she can purchase iron over-the-counter.

## 2022-12-29 ENCOUNTER — Other Ambulatory Visit: Payer: Self-pay | Admitting: Internal Medicine

## 2022-12-29 DIAGNOSIS — F320 Major depressive disorder, single episode, mild: Secondary | ICD-10-CM

## 2022-12-29 DIAGNOSIS — R109 Unspecified abdominal pain: Secondary | ICD-10-CM

## 2022-12-29 DIAGNOSIS — F419 Anxiety disorder, unspecified: Secondary | ICD-10-CM

## 2022-12-29 DIAGNOSIS — J449 Chronic obstructive pulmonary disease, unspecified: Secondary | ICD-10-CM

## 2022-12-29 DIAGNOSIS — I10 Essential (primary) hypertension: Secondary | ICD-10-CM

## 2022-12-29 DIAGNOSIS — M1712 Unilateral primary osteoarthritis, left knee: Secondary | ICD-10-CM

## 2022-12-29 DIAGNOSIS — F17200 Nicotine dependence, unspecified, uncomplicated: Secondary | ICD-10-CM

## 2022-12-30 NOTE — Telephone Encounter (Signed)
Labs in date  Requested Prescriptions  Pending Prescriptions Disp Refills   albuterol (VENTOLIN HFA) 108 (90 Base) MCG/ACT inhaler [Pharmacy Med Name: albuterol sulfate HFA 90 mcg/actuation aerosol inhaler] 8.5 g 0    Sig: inhale 2 puffs BY MOUTH EVERY 6 HOURS AS NEEDED FOR wheezing OR shortness of breath     Pulmonology:  Beta Agonists 2 Passed - 12/29/2022  2:06 PM      Passed - Last BP in normal range    BP Readings from Last 1 Encounters:  12/05/22 (!) 125/58         Passed - Last Heart Rate in normal range    Pulse Readings from Last 1 Encounters:  12/05/22 84         Passed - Valid encounter within last 12 months    Recent Outpatient Visits           3 weeks ago Black stools   [place] [facility] Jonah Blue B, MD   3 months ago Need for hepatitis C screening test   [facility] [place], Cornelius Moras, RPH-CPP   4 months ago Essential hypertension   [place] [facility] Marcine Matar, MD   7 months ago Essential hypertension   [place] [facility] Center [place], [place] L, RPH-CPP   9 months ago Chronic obstructive pulmonary disease, unspecified COPD type [facility])   [place] [facility] Marcine Matar, MD       Future Appointments             In 2 weeks Terri Piedra, DO [facility] Health Dermatology   In 2 months Marcine Matar, MD [place] [facility]             diclofenac Sodium (VOLTAREN) 1 % GEL [Pharmacy Med Name: diclofenac 1 % topical gel] 100 g 0    Sig: APPLY 2 GRAMS TOPICALLY TO THE AFFECTED AREA THREE TIMES DAILY     Analgesics:  Topicals Failed - 12/29/2022  2:06 PM      Failed - Manual Review: Labs are only required if the patient has taken medication for more than 8 weeks.      Failed - HGB in normal range and within 360 days    Hemoglobin  Date Value Ref Range Status   12/05/2022 9.6 (L) 11.1 - 15.9 g/dL Final         Failed - HCT in normal range and within 360 days    Hematocrit  Date Value Ref Range Status  12/05/2022 28.7 (L) 34.0 - 46.6 % Final         Failed - Cr in normal range and within 360 days    Creatinine, Ser  Date Value Ref Range Status  08/05/2022 1.08 (H) 0.57 - 1.00 mg/dL Final         Passed - PLT in normal range and within 360 days    Platelets  Date Value Ref Range Status  12/05/2022 365 150 - 450 x10E3/uL Final         Passed - eGFR is 30 or above and within 360 days    GFR calc Af Amer  Date Value Ref Range Status  01/30/2020 75 >59 mL/min/1.73 Final    Comment:    **[facility] currently reports eGFR in compliance with the current**   recommendations of the [employer]. [facility]  will   update reporting as new guidelines are published from the NKF-ASN   Task force.    GFR, Estimated  Date Value Ref Range Status  03/02/2022 43 (L) >60 mL/min Final    Comment:    (NOTE) Calculated using the CKD-EPI Creatinine Equation (2021)    eGFR  Date Value Ref Range Status  08/05/2022 56 (L) >59 mL/min/1.73 Final         Passed - Patient is not pregnant      Passed - Valid encounter within last 12 months    Recent Outpatient Visits           3 weeks ago Black stools   [place] [facility] Jonah Blue B, MD   3 months ago Need for hepatitis C screening test   [facility] [place], Cornelius Moras, RPH-CPP   4 months ago Essential hypertension   [place] [facility] Marcine Matar, MD   7 months ago Essential hypertension   [facility] [facility] Center [place], [place] L, RPH-CPP   9 months ago Chronic obstructive pulmonary disease, unspecified COPD type [facility])   [place] [facility] Marcine Matar, MD       Future Appointments             In 2 weeks  Terri Piedra, DO [facility] Health Dermatology   In 2 months Marcine Matar, MD [facility]             tiotropium (SPIRIVA HANDIHALER) 18 MCG inhalation capsule [Pharmacy Med Name: Spiriva with HandiHaler 18 mcg and inhalation capsules] 30 capsule 0    Sig: PLACE 1 CAPSULE INTO INHALER AND INHALE DAILY     Pulmonology:  Anticholinergic Agents Passed - 12/29/2022  2:06 PM      Passed - Valid encounter within last 12 months    Recent Outpatient Visits           3 weeks ago Black stools   [place] [facility] Jonah Blue B, MD   3 months ago Need for hepatitis C screening test   [facility] [place], Cornelius Moras, RPH-CPP   4 months ago Essential hypertension   [place] [facility] Center Marcine Matar, MD   7 months ago Essential hypertension   [place] [facility] Center [place], [place] L, RPH-CPP   9 months ago Chronic obstructive pulmonary disease, unspecified COPD type [facility])   [place] [facility] Marcine Matar, MD       Future Appointments             In 2 weeks Terri Piedra, DO [facility] Dermatology   In 2 months Marcine Matar, MD [facility]             sertraline (ZOLOFT) 25 MG tablet [Pharmacy Med Name: sertraline 25 mg tablet] 30 tablet 2    Sig: TAKE 1 Tablet BY MOUTH ONCE DAILY     Psychiatry:  Antidepressants - SSRI - sertraline Passed - 12/29/2022  2:06 PM      Passed - AST in normal range and within 360 days    AST  Date Value Ref Range Status  03/02/2022 27 15 - 41 U/L Final  Passed - ALT in normal range and within 360 days    ALT  Date Value Ref Range Status  03/02/2022 25 0 - 44 U/L Final         Passed - Completed PHQ-2 or PHQ-9 in the last 360 days      Passed - Valid encounter within last 6 months     Recent Outpatient Visits           3 weeks ago Black stools   [facility] [place], [place] B, MD   3 months ago Need for hepatitis C screening test   [facility] Center [place], Cornelius Moras, RPH-CPP   4 months ago Essential hypertension   [place] [facility] Center Jonah Blue B, MD   7 months ago Essential hypertension   [place] [facility] & Wellness Center [place], Cornelius Moras, RPH-CPP   9 months ago Chronic obstructive pulmonary disease, unspecified COPD type [facility])   [place] [facility] Center Marcine Matar, MD       Future Appointments             In 2 weeks Terri Piedra, DO [facility] Health Dermatology   In 2 months Marcine Matar, MD [facility]             irbesartan (AVAPRO) 75 MG tablet [Pharmacy Med Name: irbesartan 75 mg tablet] 90 tablet 0    Sig: TAKE 1 Tablet BY MOUTH ONCE DAILY     Cardiovascular:  Angiotensin Receptor Blockers Failed - 12/29/2022  2:06 PM      Failed - Cr in normal range and within 180 days    Creatinine, Ser  Date Value Ref Range Status  08/05/2022 1.08 (H) 0.57 - 1.00 mg/dL Final         Passed - K in normal range and within 180 days    Potassium  Date Value Ref Range Status  08/05/2022 4.4 3.5 - 5.2 mmol/L Final         Passed - Patient is not pregnant      Passed - Last BP in normal range    BP Readings from Last 1 Encounters:  12/05/22 (!) 125/58         Passed - Valid encounter within last 6 months    Recent Outpatient Visits           3 weeks ago Black stools   [place] [facility] Marcine Matar, MD   3 months ago Need for hepatitis C screening test   [facility] [place], Cornelius Moras, RPH-CPP   4 months ago Essential hypertension   [place] [facility] Center Marcine Matar, MD   7 months ago Essential hypertension   [place] [facility] Wellness Center [place], [place] L, RPH-CPP   9 months ago Chronic obstructive pulmonary disease, unspecified COPD type [facility])   [place] [facility] Center Marcine Matar, MD       Future Appointments             In 2 weeks Terri Piedra, DO [facility] Health Dermatology   In 2 months Marcine Matar, MD [facility] Center             buPROPion ER [facility]  SR) 100 MG 12 hr tablet [Pharmacy Med Name: bupropion HCl SR 100 mg tablet,12 hr sustained-release] 60 tablet 6    Sig: TAKE 1 Tablet BY MOUTH TWICE DAILY     Psychiatry: Antidepressants - bupropion Failed - 12/29/2022  2:06 PM      Failed - Cr in normal range and within 360 days    Creatinine, Ser  Date Value Ref Range Status  08/05/2022 1.08 (H) 0.57 - 1.00 mg/dL Final         Passed - AST in normal range and within 360 days    AST  Date Value Ref Range Status  03/02/2022 27 15 - 41 U/L Final         Passed - ALT in normal range and within 360 days    ALT  Date Value Ref Range Status  03/02/2022 25 0 - 44 U/L Final         Passed - Last BP in normal range    BP Readings from Last 1 Encounters:  12/05/22 (!) 125/58         Passed - Valid encounter within last 6 months    Recent Outpatient Visits           3 weeks ago Black stools   [place] [facility] Center Marcine Matar, MD   3 months ago Need for hepatitis C screening test   [facility] Center [place], Cornelius Moras, RPH-CPP   4 months ago Essential hypertension   [place] [facility] Center Marcine Matar, MD   7 months ago Essential hypertension   [place] [facility] Wellness Center [place] L, RPH-CPP   9 months ago Chronic obstructive pulmonary disease, unspecified COPD type [facility])    [place] Marcine Matar, MD       Future Appointments             In 2 weeks Terri Piedra, DO [facility] Dermatology   In 2 months Marcine Matar, MD [facility]

## 2023-01-12 ENCOUNTER — Other Ambulatory Visit: Payer: Self-pay | Admitting: Internal Medicine

## 2023-01-12 DIAGNOSIS — M1712 Unilateral primary osteoarthritis, left knee: Secondary | ICD-10-CM

## 2023-01-12 DIAGNOSIS — J449 Chronic obstructive pulmonary disease, unspecified: Secondary | ICD-10-CM

## 2023-01-12 DIAGNOSIS — R109 Unspecified abdominal pain: Secondary | ICD-10-CM

## 2023-01-19 ENCOUNTER — Ambulatory Visit: Payer: 59 | Admitting: Dermatology

## 2023-01-19 ENCOUNTER — Encounter: Payer: Self-pay | Admitting: Internal Medicine

## 2023-01-28 ENCOUNTER — Other Ambulatory Visit: Payer: Self-pay | Admitting: Internal Medicine

## 2023-01-28 DIAGNOSIS — M1712 Unilateral primary osteoarthritis, left knee: Secondary | ICD-10-CM

## 2023-01-28 DIAGNOSIS — R109 Unspecified abdominal pain: Secondary | ICD-10-CM

## 2023-01-28 DIAGNOSIS — J449 Chronic obstructive pulmonary disease, unspecified: Secondary | ICD-10-CM

## 2023-02-03 ENCOUNTER — Ambulatory Visit (HOSPITAL_COMMUNITY)
Admission: EM | Admit: 2023-02-03 | Discharge: 2023-02-03 | Disposition: A | Discharge status: Home / Self Care | Payer: 59

## 2023-02-03 ENCOUNTER — Encounter (HOSPITAL_COMMUNITY): Payer: Self-pay | Admitting: Emergency Medicine

## 2023-02-03 DIAGNOSIS — M25571 Pain in right ankle and joints of right foot: Secondary | ICD-10-CM | POA: Diagnosis not present

## 2023-02-03 DIAGNOSIS — M25511 Pain in right shoulder: Secondary | ICD-10-CM | POA: Diagnosis not present

## 2023-02-03 DIAGNOSIS — G8929 Other chronic pain: Secondary | ICD-10-CM

## 2023-02-03 DIAGNOSIS — M79643 Pain in unspecified hand: Secondary | ICD-10-CM | POA: Diagnosis not present

## 2023-02-03 DIAGNOSIS — M2550 Pain in unspecified joint: Secondary | ICD-10-CM

## 2023-02-03 MED ORDER — DEXAMETHASONE SODIUM PHOSPHATE 10 MG/ML IJ SOLN
INTRAMUSCULAR | Status: AC
  Filled 2023-02-03: qty 1

## 2023-02-03 MED ORDER — DEXAMETHASONE SODIUM PHOSPHATE 10 MG/ML IJ SOLN
10.0000 mg | Freq: Once | INTRAMUSCULAR | Status: AC
  Administered 2023-02-03: 10 mg via INTRAMUSCULAR

## 2023-02-03 NOTE — ED Provider Notes (Signed)
[facility]    CSN: [id number] Arrival date & time: 02/03/23  1429      History   Chief Complaint Chief Complaint  Patient presents with   Shoulder Pain   Foot Pain   Leg Pain    HPI Anna Mcguire is a 67 y.o. female.   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 She receives intraarticular steroid injections by specialist but missed her appointment with orthopedics to have her ankle and shoulder injection last month. She receives this injections every 3 months and has not had the chance to schedule another appointment with specialist for follow-up. Taking tylenol and hydrocodone-acetaminophen intermittently for pain without relief. She tells nursing staff in triage that she has had recent falls, unable to remember last time she fell on questioning during provider interview. No urinary symptoms, dizziness, recent antibiotic/steroid use, or history of diabetes.    Shoulder Pain Foot Pain  Leg Pain   Past Medical History:  Diagnosis Date   Acid reflux    Arthritis    Hypertension     Patient Active Problem List   Diagnosis Date Noted   Arthritis of carpometacarpal (CMC) joint of left thumb 04/23/2022   Pain in left shoulder 04/23/2022   Hypercalcemia 12/31/2021   Stage 3b chronic kidney disease (HCC) 12/31/2021   Allergic rhinitis 10/24/2020   Rotator cuff arthropathy of left shoulder 08/07/2020   Cervicalgia 08/07/2020   Chronic cough 03/21/2020   Alcohol use disorder, moderate, dependence (HCC) 01/30/2020   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   Tobacco dependence 01/30/2020   Diarrhea of infectious origin 01/30/2020   Hyponatremia 01/13/2020   Alcohol abuse 01/13/2020   Tobacco abuse 01/13/2020   Chronic diarrhea 01/13/2020   Hypophosphatemia 01/13/2020   Hypertension    Chronic obstructive  pulmonary disease (HCC)    Alcohol use    Hypokalemia 01/12/2020   Tobacco use 10/06/2019   Unilateral primary osteoarthritis, left knee 09/08/2019   Pain in right shoulder 08/11/2019    Past Surgical History:  Procedure Laterality Date   COLONOSCOPY WITH PROPOFOL N/A 06/07/2014   Procedure: COLONOSCOPY WITH PROPOFOL;  Surgeon: Willis Modena, MD;  Location: [facility];  Service: Endoscopy;  Laterality: N/A;   ESOPHAGOGASTRODUODENOSCOPY (EGD) WITH PROPOFOL N/A 06/07/2014   Procedure: ESOPHAGOGASTRODUODENOSCOPY (EGD) WITH PROPOFOL;  Surgeon: Willis Modena, MD;  Location: [facility];  Service: Endoscopy;  Laterality: N/A;    OB History   No obstetric history on file.      Home Medications    Prior to Admission medications   Medication Sig Start Date End Date Taking? Authorizing Provider  ferrous sulfate 325 (65 FE) MG tablet Take 1 tablet (325 mg total) by mouth daily with breakfast. 12/06/22   Marcine Matar, MD  HYDROcodone-acetaminophen (NORCO/VICODIN) 5-325 MG tablet Take 1 tablet by mouth every 4 (four) hours as needed. 01/29/23  Yes [provider]  albuterol (PROVENTIL) (2.5 MG/3ML) 0.083% nebulizer solution inhale THE contents of 1 vial PER nebulizer EVERY 6 HOURS AS NEEDED FOR wheezing OR SHORTNESS OF BREATH 12/31/21   Marcine Matar, MD  albuterol (VENTOLIN HFA) 108 (90 Base) MCG/ACT inhaler inhale 2 puffs BY MOUTH EVERY 6 HOURS AS NEEDED FOR wheezing OR shortness of breath 12/30/22   Marcine Matar, MD  amLODipine (NORVASC) 10 MG tablet TAKE 1 Tablet BY MOUTH ONCE DAILY FOR BLOOD PRESSURE 12/03/22  Marcine Matar, MD  atorvastatin (LIPITOR) 80 MG tablet TAKE 1 Tablet BY MOUTH ONCE DAILY 12/10/22   Marcine Matar, MD  diclofenac Sodium (VOLTAREN) 1 % GEL APPLY 2 GRAMS TOPICALLY TO THE AFFECTED AREA THREE TIMES DAILY 01/28/23   Marcine Matar, MD  DULERA 100-5 MCG/ACT AERO INHALE 2 PUFFS BY MOUTH TWICE DAILY 01/28/23   Marcine Matar, MD   hydrALAZINE (APRESOLINE) 10 MG tablet TAKE 1 Tablet BY MOUTH THREE TIMES DAILY 10/08/22   Marcine Matar, MD  irbesartan (AVAPRO) 75 MG tablet TAKE 1 Tablet BY MOUTH ONCE DAILY 12/30/22   Marcine Matar, MD  loratadine (CLARITIN) 10 MG tablet Take 1 tablet (10 mg total) by mouth daily. Patient not taking: Reported on 12/31/2021 03/19/21   Marcine Matar, MD  methocarbamol (ROBAXIN) 500 MG tablet Take 1 tablet (500 mg total) by mouth 3 (three) times daily as needed for muscle spasms. Patient not taking: Reported on 12/05/2022 10/24/22   Charlynne Pander, MD  pantoprazole (PROTONIX) 40 MG tablet 1 tab Po twice a day x 1 month then 1 tab daily 12/05/22   Marcine Matar, MD  sertraline (ZOLOFT) 25 MG tablet TAKE 1 Tablet BY MOUTH ONCE DAILY 12/30/22   Marcine Matar, MD  tiotropium (SPIRIVA HANDIHALER) 18 MCG inhalation capsule PLACE 1 CAPSULE INTO INHALER AND INHALE DAILY 01/12/23   Marcine Matar, MD  triamcinolone cream (KENALOG) 0.1 % Apply 1 Application topically 2 (two) times daily. Patient not taking: Reported on 12/05/2022 08/05/22   Marcine Matar, MD  fluticasone [facility]) 50 MCG/ACT nasal spray Place 1 spray into both nostrils daily.  05/12/19  [provider]  lisinopril-hydrochlorothiazide (PRINZIDE,ZESTORETIC) 20-12.5 MG per tablet Take 1 tablet by mouth daily.  05/12/19  [provider]  lovastatin (MEVACOR) 20 MG tablet Take 20 mg by mouth daily at 6 PM.  05/12/19  [provider]    Family History Family History  Problem Relation Age of Onset   Kidney failure Mother    Aneurysm Father     Social History Social History   Tobacco Use   Smoking status: Every Day    Current packs/day: 0.25    Average packs/day: 0.3 packs/day for 48.0 years (12.0 ttl pk-yrs)    Types: Cigarettes   Smokeless tobacco: Never   Tobacco comments:    3 times a week - 10/24/2020    Buproprion helps  Vaping Use   Vaping status: Never Used  Substance Use  Topics   Alcohol use: Yes    Comment: has an alcoholic beverage 3 days/week   Drug use: Yes    Types: Marijuana     Allergies   Chantix [varenicline tartrate] and Penicillins   Review of Systems Review of Systems Per HPI  Physical Exam Triage Vital Signs ED Triage Vitals  Encounter Vitals Group     BP 02/03/23 1443 (!) 163/70     Systolic BP Percentile --      Diastolic BP Percentile --      Pulse Rate 02/03/23 1443 77     Resp 02/03/23 1443 19     Temp 02/03/23 1443 98 F (36.7 C)     Temp Source 02/03/23 1443 Oral     SpO2 02/03/23 1443 99 %     Weight --      Height --      Head Circumference --      Peak Flow --      Pain  Score 02/03/23 1442 8     Pain Loc --      Pain Education --      Exclude from Growth Chart --    No data found.  Updated Vital Signs BP (!) 163/70 (BP Location: Left Arm)   Pulse 77   Temp 98 F (36.7 C) (Oral)   Resp 19   SpO2 99%   Visual Acuity Right Eye Distance:   Left Eye Distance:   Bilateral Distance:    Right Eye Near:   Left Eye Near:    Bilateral Near:     Physical Exam Vitals and nursing note reviewed.  Constitutional:      Appearance: She is not ill-appearing or toxic-appearing.  HENT:     Head: Normocephalic and atraumatic.     Right Ear: Hearing and external ear normal.     Left Ear: Hearing and external ear normal.     Nose: Nose normal.     Mouth/Throat:     Lips: Pink.  Eyes:     General: Lids are normal. Vision grossly intact. Gaze aligned appropriately.     Extraocular Movements: Extraocular movements intact.     Conjunctiva/sclera: Conjunctivae normal.  Pulmonary:     Effort: Pulmonary effort is normal.  Musculoskeletal:     Right shoulder: Tenderness present. No swelling, deformity, effusion, laceration, bony tenderness or crepitus. Decreased range of motion (Minimally decreased range of motion secondary to tenderness around the diffuse right shoulder joint). Normal strength. Normal pulse.      Cervical back: Neck supple.     Right ankle: No swelling, deformity, ecchymosis or lacerations. Tenderness (Diffuse tenderness to palpation of the anterior right ankle worsened by weightbearing activity.) present. Normal range of motion. Anterior drawer test negative. Normal pulse.     Right Achilles Tendon: Normal.     Right foot: Tenderness present.       Legs:     Comments: Strength and sensation intact distally to tenderness of the bilateral upper and lower extremities.  +2 bilateral radial and anterior tibialis pulses.   Skin:    General: Skin is warm and dry.     Capillary Refill: Capillary refill takes less than 2 seconds.     Findings: No rash.  Neurological:     General: No focal deficit present.     Mental Status: She is alert and oriented to person, place, and time. Mental status is at baseline.     Cranial Nerves: No dysarthria or facial asymmetry.  Psychiatric:        Mood and Affect: Mood normal.        Speech: Speech normal.        Behavior: Behavior normal.        Thought Content: Thought content normal.        Judgment: Judgment normal.      UC Treatments / Results  Labs (all labs ordered are listed, but only abnormal results are displayed) Labs Reviewed - No data to display  EKG   Radiology No results found.  Procedures Procedures (including critical care time)  Medications Ordered in UC Medications  dexamethasone (DECADRON) injection 10 mg (10 mg Intramuscular Given 02/03/23 1527)    Initial Impression / Assessment and Plan / UC Course  I have reviewed the triage vital signs and the nursing notes.  Pertinent labs & imaging results that were available during my care of the patient were reviewed by me and considered in my medical decision making (see chart for details).  1.  Chronic right shoulder pain, bilateral hand pain, and right ankle pain Chronic pain secondary to bone-on-bone arthritis to multiple joints.  Advised to schedule appointment with  orthopedic specialist as soon as possible for follow-up and intra-articular steroid injection.  Patient is not a candidate for NSAID use or oral steroid use due to history of alcohol abuse and chronic kidney disease stage III. Will treat today with intramuscular injection of dexamethasone 10 mg IM which will work in the body over the next 2 to 3 days to reduce pain and inflammation. Advised to follow-up with orthopedics at her earliest availability and continue taking medications from home supply for chronic pain.  No new injuries, therefore deferred imaging.  Counseled patient on potential for adverse effects with medications prescribed/recommended today, strict ER and return-to-clinic precautions discussed, patient verbalized understanding.    Final Clinical Impressions(s) / UC Diagnoses   Final diagnoses:  Chronic right shoulder pain  Chronic hand pain, unspecified laterality  Chronic pain of right ankle     Discharge Instructions      I gave you a steroid shot in your hip today to help with pain and inflammation.  You may resume taking tylenol as needed for pain.  Please schedule an appointment with the orthopedic provider listed on your paperwork for follow-up.   If you develop any new or worsening symptoms or if your symptoms do not start to improve, pleases return here or follow-up with your primary care provider. If your symptoms are severe, please go to the emergency room.    ED Prescriptions   None    PDMP not reviewed this encounter.   Carlisle Beers, [place] 02/03/23 1538

## 2023-02-03 NOTE — Discharge Instructions (Addendum)
I gave you a steroid shot in your hip today to help with pain and inflammation.  You may resume taking tylenol as needed for pain.  Please schedule an appointment with the orthopedic provider listed on your paperwork for follow-up.   If you develop any new or worsening symptoms or if your symptoms do not start to improve, pleases return here or follow-up with your primary care provider. If your symptoms are severe, please go to the emergency room.

## 2023-02-03 NOTE — ED Triage Notes (Signed)
Pt c/o right shoulder pain for a week.  Pt reports bilateral hand numbness for 2 weeks.  Pt c/o pain in right foot for 2 weeks. Been icing and putting alcohol on it as well as arthritis cream.  Left leg has arthritis and having bad pains in it since missed appt last month about it  Reports that she is falling a lot.

## 2023-02-05 DIAGNOSIS — M25511 Pain in right shoulder: Secondary | ICD-10-CM | POA: Diagnosis not present

## 2023-02-05 DIAGNOSIS — M25562 Pain in left knee: Secondary | ICD-10-CM | POA: Diagnosis not present

## 2023-02-06 ENCOUNTER — Other Ambulatory Visit: Payer: Self-pay | Admitting: Internal Medicine

## 2023-02-06 DIAGNOSIS — J449 Chronic obstructive pulmonary disease, unspecified: Secondary | ICD-10-CM

## 2023-02-17 ENCOUNTER — Ambulatory Visit (INDEPENDENT_AMBULATORY_CARE_PROVIDER_SITE_OTHER): Payer: 59 | Admitting: Podiatry

## 2023-02-17 ENCOUNTER — Telehealth: Payer: Self-pay

## 2023-02-17 DIAGNOSIS — L84 Corns and callosities: Secondary | ICD-10-CM

## 2023-02-17 DIAGNOSIS — B351 Tinea unguium: Secondary | ICD-10-CM | POA: Diagnosis not present

## 2023-02-17 DIAGNOSIS — I739 Peripheral vascular disease, unspecified: Secondary | ICD-10-CM

## 2023-02-17 DIAGNOSIS — M2041 Other hammer toe(s) (acquired), right foot: Secondary | ICD-10-CM

## 2023-02-17 NOTE — Telephone Encounter (Signed)
Copied from CRM [id number]. Topic: General - Other >> Feb 17, 2023 11:54 AM [facility] wrote: Reason for CRM: Pt is calling in wanting to know can she get an evaluation for a home health aid when she comes in for her appointment in September. She would also like to discuss her results from her colon screening.

## 2023-02-17 NOTE — Progress Notes (Unsigned)
       Subjective:  Patient ID: Anna Mcguire, female    DOB: 1955-12-05,  MRN: [id number]  Anna Mcguire presents to clinic today for:  Chief Complaint  Patient presents with   [facility]    RFC    Patient notes nails are thick and elongated, causing pain in shoe gear when ambulating.  She has painful calluses bilateral hallux and bilateral submet 1.  She also has painful corns on the distal aspect of third toe bilateral.  She notes her right third hammertoe has been painful.  Denies injury  PCP is Marcine Matar, MD..  Date last seen was 12/05/2022  Allergies  Allergen Reactions   Chantix [Varenicline Tartrate]     Diarrhea and dizziness   Penicillins Diarrhea    Review of Systems: Negative except as noted in the HPI.  Objective:  There were no vitals filed for this visit.  Anna Mcguire is a pleasant 67 y.o. female in NAD. AAO x 3.  Vascular Examination: Patient has palpable DP pulse, absent PT pulse bilateral.  Delayed capillary refill bilateral toes.  Sparse digital hair bilateral.  Proximal to distal cooling WNL bilateral.    Dermatological Examination: Interspaces are clear with no open lesions noted bilateral.  Nails are 3-24mm thick, with yellowish/brown discoloration, subungual debris and distal onycholysis x10.  There is pain with compression of nails x10.  There are hyperkeratotic lesions noted on the plantar medial aspect of the hallux IPJ bilateral, plantar medial aspect of the first MPJ bilateral, and the distal aspect of the third toe bilateral.  Musculoskeletal examination: There is a semirigid contracture of the right third toe at the PIP joint.  There is pain on palpation at the PIP joint.  No edema is noted.  The toe is not fully reducible.  Patient qualifies for at-risk foot care because of PVD .  Assessment/Plan: 1. Dermatophytosis of nail   2. Callus of foot   3. PVD (peripheral vascular disease) (HCC)   4. Hammertoe of right foot      Mycotic nails x10 were sharply debrided with sterile nail nippers and power debriding burr to decrease bulk and length.  Hyperkeratotic lesions x 6 were shaved with a sterile #312 blade.  Two of these lesions were proximal to the DIPJ.  Discussed the third hammertoe deformity on the right foot.  Patient would benefit from a supportive device since the toe is not reducible with manipulation.  She was fitted for a size medium gel crest pad and noted immediate improvement upon weightbearing.  She will wear this during waking hours.  If she continues to have discomfort to the area we may need to discuss surgical correction of the hammertoe deformity.  Return in about 3 months (around 05/20/2023) for RFC.   Clerance Lav, DPM, FACFAS [facility]     [address] [place], [place] [address]                Office [phone]  Fax [phone]

## 2023-02-19 NOTE — Telephone Encounter (Signed)
Called & spoke to a family member. Patient is no available. Left message for patient to call back.

## 2023-02-24 ENCOUNTER — Encounter (HOSPITAL_COMMUNITY): Payer: Self-pay | Admitting: *Deleted

## 2023-02-24 ENCOUNTER — Ambulatory Visit (HOSPITAL_COMMUNITY)
Admission: EM | Admit: 2023-02-24 | Discharge: 2023-02-24 | Disposition: A | Discharge status: Home / Self Care | Payer: 59

## 2023-02-24 DIAGNOSIS — M79671 Pain in right foot: Secondary | ICD-10-CM | POA: Diagnosis not present

## 2023-02-24 DIAGNOSIS — M79674 Pain in right toe(s): Secondary | ICD-10-CM

## 2023-02-24 DIAGNOSIS — W19XXXA Unspecified fall, initial encounter: Secondary | ICD-10-CM

## 2023-02-24 DIAGNOSIS — M25511 Pain in right shoulder: Secondary | ICD-10-CM | POA: Diagnosis not present

## 2023-02-24 NOTE — ED Triage Notes (Signed)
Pt states she fell 2 weeks ago and now she is having right pain and right 3rd digit pain. She states she has been taking tylenol and rubbing alcohol on her shoulder without relief. She states her hands are going numb as well which she was seen for at her last visit.   She does have an appt with pcp in sept

## 2023-02-24 NOTE — ED Provider Notes (Signed)
[facility]    CSN: [id number] Arrival date & time: 02/24/23  1307      History   Chief Complaint Chief Complaint  Patient presents with   Fall   Shoulder Pain    HPI Anna Mcguire is a 67 y.o. female.   Patient presents today with right shoulder and right third digit pain that began 2 weeks ago after she fell.  Reports she has severe pain in both of her knees, worse on the left currently and her left knee gave out, causing the fall.  Reports she missed her last appointment with an orthopedic provider, usually gets an injection in her shoulder for chronic pain and is planning to make another appointment soon.  She denies decreased range of motion to the shoulder or toe.  Reports the foot was a little bit swollen at first, however this improved after application of alcohol.  No bruising to the shoulder.  Has been taking Tylenol and using Voltaren gel with some improvement in pain.    Past Medical History:  Diagnosis Date   Acid reflux    Arthritis    Hypertension     Patient Active Problem List   Diagnosis Date Noted   Arthritis of carpometacarpal [facility]) joint of left thumb 04/23/2022   Pain in left shoulder 04/23/2022   Hypercalcemia 12/31/2021   Stage 3b chronic kidney disease (HCC) 12/31/2021   Allergic rhinitis 10/24/2020   Rotator cuff arthropathy of left shoulder 08/07/2020   Cervicalgia 08/07/2020   Chronic cough 03/21/2020   Alcohol use disorder, moderate, dependence (HCC) 01/30/2020   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   Tobacco dependence 01/30/2020   Diarrhea of infectious origin 01/30/2020   Hyponatremia 01/13/2020   Alcohol abuse 01/13/2020   Tobacco abuse 01/13/2020   Chronic diarrhea 01/13/2020   Hypophosphatemia 01/13/2020   Hypertension    Chronic obstructive pulmonary disease (HCC)    Alcohol use    Hypokalemia 01/12/2020   Tobacco use 10/06/2019   Unilateral primary osteoarthritis, left knee  09/08/2019   Pain in right shoulder 08/11/2019    Past Surgical History:  Procedure Laterality Date   COLONOSCOPY WITH PROPOFOL N/A 06/07/2014   Procedure: COLONOSCOPY WITH PROPOFOL;  Surgeon: Willis Modena, MD;  Location: [facility];  Service: Endoscopy;  Laterality: N/A;   ESOPHAGOGASTRODUODENOSCOPY (EGD) WITH PROPOFOL N/A 06/07/2014   Procedure: ESOPHAGOGASTRODUODENOSCOPY (EGD) WITH PROPOFOL;  Surgeon: Willis Modena, MD;  Location: [facility];  Service: Endoscopy;  Laterality: N/A;    OB History   No obstetric history on file.      Home Medications    Prior to Admission medications   Medication Sig Start Date End Date Taking? Authorizing Provider  albuterol (PROVENTIL) (2.5 MG/3ML) 0.083% nebulizer solution inhale THE contents of 1 vial PER nebulizer EVERY 6 HOURS AS NEEDED FOR wheezing OR SHORTNESS OF BREATH 12/31/21  Yes Marcine Matar, MD  albuterol (VENTOLIN HFA) 108 (90 Base) MCG/ACT inhaler inhale 2 puffs BY MOUTH EVERY 6 HOURS AS NEEDED FOR wheezing OR shortness of breath 02/06/23  Yes Marcine Matar, MD  amLODipine (NORVASC) 10 MG tablet TAKE 1 Tablet BY MOUTH ONCE DAILY FOR BLOOD PRESSURE 12/03/22  Yes Marcine Matar, MD  atorvastatin (LIPITOR) 80 MG tablet TAKE 1 Tablet BY MOUTH ONCE DAILY 12/10/22  Yes Marcine Matar, MD  diclofenac Sodium (VOLTAREN) 1 % GEL APPLY 2 GRAMS TOPICALLY TO THE AFFECTED AREA THREE TIMES DAILY 01/28/23  Yes Marcine Matar, MD  [facility]  100-5 MCG/ACT AERO INHALE 2 PUFFS BY MOUTH TWICE DAILY 01/28/23  Yes Marcine Matar, MD  ferrous sulfate 325 (65 FE) MG tablet Take 1 tablet (325 mg total) by mouth daily with breakfast. 12/06/22  Yes Marcine Matar, MD  hydrALAZINE (APRESOLINE) 10 MG tablet TAKE 1 Tablet BY MOUTH THREE TIMES DAILY 10/08/22  Yes Marcine Matar, MD  HYDROcodone-acetaminophen (NORCO/VICODIN) 5-325 MG tablet Take 1 tablet by mouth every 4 (four) hours as needed. 01/29/23  Yes [provider]  irbesartan  (AVAPRO) 75 MG tablet TAKE 1 Tablet BY MOUTH ONCE DAILY 12/30/22  Yes Marcine Matar, MD  loratadine (CLARITIN) 10 MG tablet Take 1 tablet (10 mg total) by mouth daily. 03/19/21  Yes Marcine Matar, MD  methocarbamol (ROBAXIN) 500 MG tablet Take 1 tablet (500 mg total) by mouth 3 (three) times daily as needed for muscle spasms. 10/24/22  Yes Charlynne Pander, MD  pantoprazole (PROTONIX) 40 MG tablet 1 tab Po twice a day x 1 month then 1 tab daily 12/05/22  Yes Marcine Matar, MD  sertraline (ZOLOFT) 25 MG tablet TAKE 1 Tablet BY MOUTH ONCE DAILY 12/30/22  Yes Marcine Matar, MD  tiotropium (SPIRIVA HANDIHALER) 18 MCG inhalation capsule PLACE 1 CAPSULE INTO INHALER AND INHALE DAILY 01/12/23  Yes Marcine Matar, MD  triamcinolone cream (KENALOG) 0.1 % Apply 1 Application topically 2 (two) times daily. 08/05/22  Yes Marcine Matar, MD  fluticasone (FLONASE) 50 MCG/ACT nasal spray Place 1 spray into both nostrils daily.  05/12/19  [provider]  lisinopril-hydrochlorothiazide (PRINZIDE,ZESTORETIC) 20-12.5 MG per tablet Take 1 tablet by mouth daily.  05/12/19  [provider]  lovastatin (MEVACOR) 20 MG tablet Take 20 mg by mouth daily at 6 PM.  05/12/19  [provider]    Family History Family History  Problem Relation Age of Onset   Kidney failure Mother    Aneurysm Father     Social History Social History   Tobacco Use   Smoking status: Every Day    Current packs/day: 0.25    Average packs/day: 0.3 packs/day for 48.0 years (12.0 ttl pk-yrs)    Types: Cigarettes   Smokeless tobacco: Never   Tobacco comments:    3 times a week - 10/24/2020    Buproprion helps  Vaping Use   Vaping status: Never Used  Substance Use Topics   Alcohol use: Yes    Comment: has an alcoholic beverage 3 days/week   Drug use: Yes    Types: Marijuana     Allergies   Chantix [varenicline tartrate] and Penicillins   Review of Systems Review of Systems Per  HPI  Physical Exam Triage Vital Signs ED Triage Vitals  Encounter Vitals Group     BP 02/24/23 1328 (!) 151/72     Systolic BP Percentile --      Diastolic BP Percentile --      Pulse Rate 02/24/23 1328 82     Resp 02/24/23 1328 18     Temp 02/24/23 1328 98.4 F (36.9 C)     Temp Source 02/24/23 1328 Oral     SpO2 02/24/23 1328 98 %     Weight --      Height --      Head Circumference --      Peak Flow --      Pain Score 02/24/23 1327 8     Pain Loc --      Pain Education --  Exclude from Growth Chart --    No data found.  Updated Vital Signs BP (!) 151/72 (BP Location: Left Arm)   Pulse 82   Temp 98.4 F (36.9 C) (Oral)   Resp 18   SpO2 98%   Visual Acuity Right Eye Distance:   Left Eye Distance:   Bilateral Distance:    Right Eye Near:   Left Eye Near:    Bilateral Near:     Physical Exam Vitals and nursing note reviewed.  Constitutional:      General: She is not in acute distress.    Appearance: Normal appearance. She is not toxic-appearing.  HENT:     Mouth/Throat:     Mouth: Mucous membranes are moist.     Pharynx: Oropharynx is clear.  Pulmonary:     Effort: Pulmonary effort is normal. No respiratory distress.  Musculoskeletal:     Comments: Inspection: no swelling, bruising, obvious deformity or redness tor right shoulder Palpation: Right shoulder is nontender to palpation diffusely; no obvious deformities palpated ROM: Full ROM to right shoulder Strength: 5/5 bilateral upper extremities Neurovascular: neurovascularly intact in lateral upper extremities  Feet:     Comments: Right third distal phalanx tender to touch; no obvious deformity, swelling, or bruising.  Patient has full range of motion of the digit.  She is neurovascularly intact distal to the pain. Skin:    General: Skin is warm and dry.     Capillary Refill: Capillary refill takes less than 2 seconds.     Coloration: Skin is not jaundiced or pale.     Findings: No erythema.   Neurological:     Mental Status: She is alert and oriented to person, place, and time.  Psychiatric:        Behavior: Behavior is cooperative.      UC Treatments / Results  Labs (all labs ordered are listed, but only abnormal results are displayed) Labs Reviewed - No data to display  EKG   Radiology No results found.  Procedures Procedures (including critical care time)  Medications Ordered in UC Medications - No data to display  Initial Impression / Assessment and Plan / UC Course  I have reviewed the triage vital signs and the nursing notes.  Pertinent labs & imaging results that were available during my care of the patient were reviewed by me and considered in my medical decision making (see chart for details).   Patient is well-appearing,  afebrile, not tachycardic, not tachypneic, oxygenating well on room air.  Patient is mildly hypertensive in urgent care today.  1. Fall, initial encounter 2. Acute pain of right shoulder 3. Pain of toe of right foot Reassurance provided Examination is largely reassuring today, no concern for fracture Given length of injury, x-ray images deferred Postop shoe provided for ongoing right toe pain Recommended continue Tylenol/Voltaren gel as needed for pain Follow-up with orthopedic provider with no improvement or worsening symptoms despite treatment  The patient was given the opportunity to ask questions.  All questions answered to their satisfaction.  The patient is in agreement to this plan.    Final Clinical Impressions(s) / UC Diagnoses   Final diagnoses:  Fall, initial encounter  Acute pain of right shoulder  Pain of toe of right foot     Discharge Instructions      Please wear the postop shoe when you are bearing weight to help with the toe pain.  You can continue Tylenol 500 to 1000 mg every 6 hours for pain.  Continue Voltaren gel as well.  Plan to follow-up with orthopedic provider regarding pain.    ED  Prescriptions   None    I have reviewed the PDMP during this encounter.   Valentino Nose, NP 02/24/23 1409

## 2023-02-24 NOTE — Discharge Instructions (Addendum)
Please wear the postop shoe when you are bearing weight to help with the toe pain.  You can continue Tylenol 500 to 1000 mg every 6 hours for pain.  Continue Voltaren gel as well.  Plan to follow-up with orthopedic provider regarding pain.

## 2023-02-25 DIAGNOSIS — M25511 Pain in right shoulder: Secondary | ICD-10-CM | POA: Diagnosis not present

## 2023-02-27 ENCOUNTER — Other Ambulatory Visit: Payer: Self-pay | Admitting: Internal Medicine

## 2023-02-27 DIAGNOSIS — J449 Chronic obstructive pulmonary disease, unspecified: Secondary | ICD-10-CM

## 2023-02-28 DIAGNOSIS — M25511 Pain in right shoulder: Secondary | ICD-10-CM | POA: Diagnosis not present

## 2023-02-28 DIAGNOSIS — S46811D Strain of other muscles, fascia and tendons at shoulder and upper arm level, right arm, subsequent encounter: Secondary | ICD-10-CM | POA: Diagnosis not present

## 2023-02-28 DIAGNOSIS — M7581 Other shoulder lesions, right shoulder: Secondary | ICD-10-CM | POA: Diagnosis not present

## 2023-03-13 ENCOUNTER — Ambulatory Visit
Admission: RE | Admit: 2023-03-13 | Discharge: 2023-03-13 | Disposition: A | Discharge status: Home / Self Care | Payer: 59 | Source: Ambulatory Visit | Attending: Internal Medicine | Admitting: Internal Medicine

## 2023-03-13 ENCOUNTER — Ambulatory Visit: Payer: 59 | Attending: Internal Medicine | Admitting: Internal Medicine

## 2023-03-13 ENCOUNTER — Encounter: Payer: Self-pay | Admitting: Internal Medicine

## 2023-03-13 VITALS — BP 126/62 | HR 76 | Ht 66.0 in | Wt 106.0 lb

## 2023-03-13 DIAGNOSIS — M25552 Pain in left hip: Secondary | ICD-10-CM | POA: Diagnosis not present

## 2023-03-13 DIAGNOSIS — I10 Essential (primary) hypertension: Secondary | ICD-10-CM

## 2023-03-13 DIAGNOSIS — M1712 Unilateral primary osteoarthritis, left knee: Secondary | ICD-10-CM

## 2023-03-13 DIAGNOSIS — Z23 Encounter for immunization: Secondary | ICD-10-CM

## 2023-03-13 DIAGNOSIS — R109 Unspecified abdominal pain: Secondary | ICD-10-CM

## 2023-03-13 DIAGNOSIS — E46 Unspecified protein-calorie malnutrition: Secondary | ICD-10-CM

## 2023-03-13 DIAGNOSIS — F320 Major depressive disorder, single episode, mild: Secondary | ICD-10-CM

## 2023-03-13 DIAGNOSIS — D649 Anemia, unspecified: Secondary | ICD-10-CM

## 2023-03-13 DIAGNOSIS — R296 Repeated falls: Secondary | ICD-10-CM

## 2023-03-13 DIAGNOSIS — R200 Anesthesia of skin: Secondary | ICD-10-CM | POA: Diagnosis not present

## 2023-03-13 DIAGNOSIS — R2689 Other abnormalities of gait and mobility: Secondary | ICD-10-CM

## 2023-03-13 DIAGNOSIS — R202 Paresthesia of skin: Secondary | ICD-10-CM | POA: Diagnosis not present

## 2023-03-13 DIAGNOSIS — M160 Bilateral primary osteoarthritis of hip: Secondary | ICD-10-CM | POA: Diagnosis not present

## 2023-03-13 DIAGNOSIS — R634 Abnormal weight loss: Secondary | ICD-10-CM | POA: Diagnosis not present

## 2023-03-13 DIAGNOSIS — F419 Anxiety disorder, unspecified: Secondary | ICD-10-CM

## 2023-03-13 MED ORDER — TRAMADOL HCL 50 MG PO TABS
50.0000 mg | ORAL_TABLET | Freq: Two times a day (BID) | ORAL | 0 refills | Status: DC | PRN
Start: 2023-03-13 — End: 2023-03-25

## 2023-03-13 MED ORDER — SERTRALINE HCL 25 MG PO TABS: 25.0000 mg | ORAL_TABLET | Freq: Every day | ORAL | 2 refills | Status: DC

## 2023-03-13 MED ORDER — PANTOPRAZOLE SODIUM 40 MG PO TBEC: DELAYED_RELEASE_TABLET | ORAL | 1 refills | Status: DC

## 2023-03-13 MED ORDER — DICLOFENAC SODIUM 1 % EX GEL: 2.0000 g | Freq: Four times a day (QID) | CUTANEOUS | 1 refills | Status: DC

## 2023-03-13 MED ORDER — AMLODIPINE BESYLATE 10 MG PO TABS
ORAL_TABLET | ORAL | 1 refills | Status: DC
Start: 2023-03-13 — End: 2023-08-05

## 2023-03-13 MED ORDER — IRBESARTAN 75 MG PO TABS: 75.0000 mg | ORAL_TABLET | Freq: Every day | ORAL | 1 refills | Status: DC

## 2023-03-13 NOTE — Progress Notes (Signed)
Patient ID: Anna Mcguire, female    DOB: 05-16-1956  MRN: [id number]  CC: Hypertension (HTN f/u. Med refills. /Reports fall last week - multiple falls in the last 12 months/Numbness in hands  X1 mo/Flu vax administered today 03/13/23 - C.A)   Subjective: Anna Mcguire is a 67 y.o. female who presents for chronic ds management. Her concerns today include:  patient with history of HTN, HL, tob dep, COPD, OA left knee, EtOH abuse, chronic diarrhea, SIADH, ACD with macrocytosis, CKD 3, Hypercalcemia likely due to primary hyperparathyroid   HTN:   On hydralazine 10 mg 3 times a day, Norvasc 10 mg daily and irbesartan 75 mg daily.  Did take meds  this a.m.  taking consistently. Limit salt in the foods.  OA/Falls: Complains of recurrent falls over the past 2 months which she attributes to the left knee giving out on her.  Seen at UC end last mth post fall.  Last fall was last wk; had problems getting around since then with increase pain LT hip and RT shoulder.  LT knee has been giving out. Saw ortho,Dr. Wyline Mood several wks ago; given knee brace and told she needs knee replacement ASAP.  Has f/u on 03/16/2023 to talk about surgery.  Sister currently staying with her to assist with care.  Request HH aid. Numbness in both hands x 1 mth.  All fingers, getting worse every wk.  Constant.  Problems with grip.  I note she has loss wgh since last visit.  Down 14 lbs since end of January She is not sure why.  Eats 2-3 meals a day with snacks in between; good appetite. Little cough; Referred for MMG and LDCT chest for lung CA screen.  Has not had them done states she has been sick Also referred to GI for eval of black stools on last visit; they have not been able to reach her.  No longer having black stools.  CBC on last visit in May revealed Hb stable in the upper 9s.  Was advised to restart iron.  She has not been taking it.  Endorses dizziness sometimes when she stands up. Started on Zoloft last visit  for depression/anxiety.  Feels Zoloft helps.  Declines increase dose but requests refill. We always have issues reaching her via phone and I suspect [facility] has not been able to reach her to schedule mammogram and CT of the chest for the same reason.  Phone number listed is her daughter's phone number.  She relies on her to give her messages.  Does not have a phone of her own.  Patient Active Problem List   Diagnosis Date Noted   Arthritis of carpometacarpal [facility]) joint of left thumb 04/23/2022   Pain in left shoulder 04/23/2022   Hypercalcemia 12/31/2021   Stage 3b chronic kidney disease (HCC) 12/31/2021   Allergic rhinitis 10/24/2020   Rotator cuff arthropathy of left shoulder 08/07/2020   Cervicalgia 08/07/2020   Chronic cough 03/21/2020   Alcohol use disorder, moderate, dependence (HCC) 01/30/2020   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   Tobacco dependence 01/30/2020   Diarrhea of infectious origin 01/30/2020   Hyponatremia 01/13/2020   Alcohol abuse 01/13/2020   Tobacco abuse 01/13/2020   Chronic diarrhea 01/13/2020   Hypophosphatemia 01/13/2020   Hypertension    Chronic obstructive pulmonary disease (HCC)    Alcohol use    Hypokalemia 01/12/2020   Tobacco use 10/06/2019   Unilateral primary osteoarthritis, left knee 09/08/2019  Pain in right shoulder 08/11/2019     Current Outpatient Medications on File Prior to Visit  Medication Sig Dispense Refill   albuterol (PROVENTIL) (2.5 MG/3ML) 0.083% nebulizer solution inhale THE contents of 1 vial PER nebulizer EVERY 6 HOURS AS NEEDED FOR wheezing OR SHORTNESS OF BREATH 150 mL 5   albuterol (VENTOLIN HFA) 108 (90 Base) MCG/ACT inhaler inhale 2 puffs BY MOUTH EVERY 6 HOURS AS NEEDED FOR wheezing OR shortness of breath 8.5 g 0   atorvastatin (LIPITOR) 80 MG tablet TAKE 1 Tablet BY MOUTH ONCE DAILY 90 tablet 1   hydrALAZINE (APRESOLINE) 10 MG tablet TAKE 1 Tablet BY MOUTH THREE TIMES DAILY 270  tablet 1   DULERA 100-5 MCG/ACT AERO INHALE 2 PUFFS BY MOUTH TWICE DAILY (Patient not taking: Reported on 03/13/2023) 13 g 6   ferrous sulfate 325 (65 FE) MG tablet Take 1 tablet (325 mg total) by mouth daily with breakfast. (Patient not taking: Reported on 03/13/2023) 100 tablet 1   loratadine (CLARITIN) 10 MG tablet Take 1 tablet (10 mg total) by mouth daily. (Patient not taking: Reported on 03/13/2023) 90 tablet 2   methocarbamol (ROBAXIN) 500 MG tablet Take 1 tablet (500 mg total) by mouth 3 (three) times daily as needed for muscle spasms. (Patient not taking: Reported on 03/13/2023) 10 tablet 0   tiotropium (SPIRIVA HANDIHALER) 18 MCG inhalation capsule PLACE 1 CAPSULE INTO INHALER AND INHALE DAILY (Patient not taking: Reported on 03/13/2023) 30 capsule 1   triamcinolone cream (KENALOG) 0.1 % Apply 1 Application topically 2 (two) times daily. (Patient not taking: Reported on 03/13/2023) 30 g 1   [DISCONTINUED] fluticasone (FLONASE) 50 MCG/ACT nasal spray Place 1 spray into both nostrils daily.     [DISCONTINUED] lisinopril-hydrochlorothiazide (PRINZIDE,ZESTORETIC) 20-12.5 MG per tablet Take 1 tablet by mouth daily.     [DISCONTINUED] lovastatin (MEVACOR) 20 MG tablet Take 20 mg by mouth daily at 6 PM.     No current facility-administered medications on file prior to visit.    Allergies  Allergen Reactions   Chantix [Varenicline Tartrate]     Diarrhea and dizziness   Penicillins Diarrhea    Social History   Socioeconomic History   Marital status: Single    Spouse name: Not on file   Number of children: Not on file   Years of education: Not on file   Highest education level: Not on file  Occupational History   Not on file  Tobacco Use   Smoking status: Every Day    Current packs/day: 0.25    Average packs/day: 0.3 packs/day for 48.0 years (12.0 ttl pk-yrs)    Types: Cigarettes   Smokeless tobacco: Never   Tobacco comments:    3 times a week - 10/24/2020    Buproprion helps  Vaping Use    Vaping status: Never Used  Substance and Sexual Activity   Alcohol use: Yes    Comment: has an alcoholic beverage 3 days/week   Drug use: Yes    Types: Marijuana   Sexual activity: Not Currently  Other Topics Concern   Not on file  Social History Narrative   Retired   International aid/development worker of [employer] Strain: Not on file  Food Insecurity: Not on file  Transportation Needs: Not on file  Physical Activity: Not on file  Stress: Not on file  Social Connections: Not on file  Intimate Partner Violence: Not on file    Family History  Problem Relation Age of Onset  Kidney failure Mother    Aneurysm Father     Past Surgical History:  Procedure Laterality Date   COLONOSCOPY WITH PROPOFOL N/A 06/07/2014   Procedure: COLONOSCOPY WITH PROPOFOL;  Surgeon: Willis Modena, MD;  Location: [facility];  Service: Endoscopy;  Laterality: N/A;   ESOPHAGOGASTRODUODENOSCOPY (EGD) WITH PROPOFOL N/A 06/07/2014   Procedure: ESOPHAGOGASTRODUODENOSCOPY (EGD) WITH PROPOFOL;  Surgeon: Willis Modena, MD;  Location: [facility];  Service: Endoscopy;  Laterality: N/A;    ROS: Review of Systems Negative except as stated above  PHYSICAL EXAM: BP 126/62 (BP Location: Left Arm, Patient Position: Sitting, Cuff Size: Normal)   Pulse 76   Ht 5\' 6"  (1.676 m)   Wt 106 lb (48.1 kg)   SpO2 99%   BMI 17.11 kg/m   Wt Readings from Last 3 Encounters:  03/13/23 106 lb (48.1 kg)  12/05/22 117 lb 6.4 oz (53.3 kg)  10/24/22 120 lb (54.4 kg)    Physical Exam  General appearance -patient is elderly African-American female.  She is alert.  She appears malnourished/underweight for height Mental status - normal mood, behavior, speech, dress, motor activity, and thought processes Eyes -pale conjunctiva Mouth -oral mucosa is moist Chest - clear to auscultation, no wheezes, rales or rhonchi, symmetric air entry Heart - normal rate, regular rhythm, normal S1, S2, no murmurs, rubs, clicks or  gallops Abdomen - soft, nontender,  Musculoskeletal -wearing brace on left knee.  Has a rollator walker with her.  Mild tenderness on palpation over the left lateral hip. Extremities -no lower extremity edema Neuro: Mild enlargement of joints in the hands.  Mild wasting of intrinsic muscles of the hands.  Grip 4/5.     03/13/2023    9:28 AM 12/05/2022   12:18 PM 09/04/2022   10:57 AM  Depression screen PHQ 2/9  Decreased Interest 2 2 1   Down, Depressed, Hopeless 2 1 1   PHQ - 2 Score 4 3 2   Altered sleeping 0 0 1  Tired, decreased energy 2 2 1   Change in appetite 2 2 0  Feeling bad or failure about yourself  2 2 1   Trouble concentrating 2 0 1  Moving slowly or fidgety/restless 2 2 0  Suicidal thoughts 0 0 0  PHQ-9 Score 14 11 6   Difficult doing work/chores Somewhat difficult  Somewhat difficult       Latest Ref Rng & Units 08/05/2022    9:43 AM 03/02/2022    3:37 PM 01/21/2022   10:31 AM  CMP  Glucose 70 - 99 mg/dL 86  474    BUN 8 - 27 mg/dL 19  22    Creatinine 2.59 - 1.00 mg/dL 5.63  8.75    Sodium 643 - 144 mmol/L 140  139    Potassium 3.5 - 5.2 mmol/L 4.4  4.4    Chloride 96 - 106 mmol/L 106  110    CO2 20 - 29 mmol/L 19  23    Calcium 8.7 - 10.3 mg/dL 32.9  51.8  84.1   Total Protein 6.5 - 8.1 g/dL  6.5    Total Bilirubin 0.3 - 1.2 mg/dL  0.3    Alkaline Phos 38 - 126 U/L  115    AST 15 - 41 U/L  27    ALT 0 - 44 U/L  25     Lipid Panel     Component Value Date/Time   CHOL 155 03/20/2021 1454   TRIG 258 (H) 03/20/2021 1454   HDL 82 03/20/2021 1454  CHOLHDL 1.9 03/20/2021 1454   CHOLHDL 1.5 01/15/2020 0258   VLDL 23 01/15/2020 0258   LDLCALC 35 03/20/2021 1454    CBC    Component Value Date/Time   WBC 7.1 12/05/2022 1223   WBC 4.7 03/02/2022 1537   RBC 2.96 (L) 12/05/2022 1223   RBC 3.02 (L) 03/02/2022 1537   HGB 9.6 (L) 12/05/2022 1223   HCT 28.7 (L) 12/05/2022 1223   PLT 365 12/05/2022 1223   MCV 97 12/05/2022 1223   MCH 32.4 12/05/2022 1223   MCH  32.8 03/02/2022 1537   MCHC 33.4 12/05/2022 1223   MCHC 32.5 03/02/2022 1537   RDW 12.1 12/05/2022 1223   LYMPHSABS 1.4 03/02/2022 1537   MONOABS 0.6 03/02/2022 1537   EOSABS 0.2 03/02/2022 1537   BASOSABS 0.1 03/02/2022 1537    ASSESSMENT AND PLAN: 1. Primary osteoarthritis of left knee Patient to keep follow-up appointment with her orthopedics next week. I have given a limited course of tramadol for her to use as needed.  Advised that the medication is a narcotic and should not be taken with alcohol.  Advised that the medication can cause drowsiness.  Stop the medication if she develops any significant side effects and give me a call. - diclofenac Sodium (VOLTAREN) 1 % GEL; Apply 2 g topically 4 (four) times daily.  Dispense: 100 g; Refill: 1 - traMADol (ULTRAM) 50 MG tablet; Take 1 tablet (50 mg total) by mouth 2 (two) times daily as needed.  Dispense: 30 tablet; Refill: 0  2. Recurrent falls 3. Decreased mobility Recurrent falls due to severe OA of the left knee.  Now has a knee brace.  Encouraged her to continue using rollator walker.  Will submit referral for home health aide.  4. Weight loss, abnormal Discussed importance of getting her up-to-date with age-appropriate cancer screenings.  We have sent her to [facility] imaging today for x-ray of the left hip.  Advised to have them schedule her mammogram and CAT scan of the chest while she is there since there is always difficulty in getting her on the phone.  She expressed understanding. Also given phone number for [facility] to call them to schedule colonoscopy/endoscopy as they have not been able to reach her via phone. -Check baseline blood tests including thyroid studies. - CBC - Comprehensive metabolic panel - [id number]  5. Protein-calorie malnutrition, unspecified severity (HCC) See #4 above.  Reports good appetite.  Needs to get up-to-date with cancer screenings.  Discouraged alcohol use.  6. Acute left  flank pain 7. Acute hip pain, left See #1 above. - diclofenac Sodium (VOLTAREN) 1 % GEL; Apply 2 g topically 4 (four) times daily.  Dispense: 100 g; Refill: 1 - DG Hip Unilat W OR W/O Pelvis 2-3 Views Left; Future - traMADol (ULTRAM) 50 MG tablet; Take 1 tablet (50 mg total) by mouth 2 (two) times daily as needed.  Dispense: 30 tablet; Refill: 0    8. Essential hypertension At goal.  Continue current medications - amLODipine (NORVASC) 10 MG tablet; TAKE 1 Tablet BY MOUTH ONCE DAILY FOR BLOOD PRESSURE  Dispense: 90 tablet; Refill: 1 - irbesartan (AVAPRO) 75 MG tablet; Take 1 tablet (75 mg total) by mouth daily.  Dispense: 90 tablet; Refill: 1  9. Mild major depression (HCC) Patient reports she is doing okay on the Zoloft.  PHQ-9 is still elevated.  She declines increased dose. - sertraline (ZOLOFT) 25 MG tablet; Take 1 tablet (25 mg total) by mouth daily.  Dispense: 30  tablet; Refill: 2  10. Anxiety - sertraline (ZOLOFT) 25 MG tablet; Take 1 tablet (25 mg total) by mouth daily.  Dispense: 30 tablet; Refill: 2  11. Chronic anemia Advised patient to restart iron supplement daily - Vitamin B12 - Folate  12. Numbness and tingling in both hands Given prescription to get cock up wrist splints - For home use only DME Other see comment - Vitamin B12 - Folate  13. Encounter for immunization - Flu Vaccine Trivalent High Dose (Fluad)    Patient was given the opportunity to ask questions.  Patient verbalized understanding of the plan and was able to repeat key elements of the plan.   This documentation was completed using Paediatric nurse.  Any transcriptional errors are unintentional.  Orders Placed This Encounter  Procedures   For home use only DME Other see comment   DG Hip Unilat W OR W/O Pelvis 2-3 Views Left   Flu Vaccine Trivalent High Dose (Fluad)   CBC   Comprehensive metabolic panel   Vitamin B12   Folate   TSH+T4F+T3Free     Requested  Prescriptions   Signed Prescriptions Disp Refills   diclofenac Sodium (VOLTAREN) 1 % GEL 100 g 1    Sig: Apply 2 g topically 4 (four) times daily.   amLODipine (NORVASC) 10 MG tablet 90 tablet 1    Sig: TAKE 1 Tablet BY MOUTH ONCE DAILY FOR BLOOD PRESSURE   irbesartan (AVAPRO) 75 MG tablet 90 tablet 1    Sig: Take 1 tablet (75 mg total) by mouth daily.   pantoprazole (PROTONIX) 40 MG tablet 120 tablet 1    Sig: 1 tab Po twice a day x 1 month then 1 tab daily   sertraline (ZOLOFT) 25 MG tablet 30 tablet 2    Sig: Take 1 tablet (25 mg total) by mouth daily.   traMADol (ULTRAM) 50 MG tablet 30 tablet 0    Sig: Take 1 tablet (50 mg total) by mouth 2 (two) times daily as needed.    Return in about 2 months (around 05/13/2023).  Jonah Blue, MD, FACP

## 2023-03-13 NOTE — Patient Instructions (Addendum)
Please call [facility] to schedule your appointment.  They have been trying to reach you.  You need to have a colonoscopy and endoscopy done due to the anemia and unexplained weight loss.  [facility] Address: [address] [place] [address], [place], [place] [address] Phone #: [phone]  Please go to [facility] imaging today to have x-ray done of the left hip.  They are located at [address]  While there, have them set up the appointment for you to have  the CAT scan of your chest and your mammogram that were ordered several months ago..  We will submit referral for home health aide.

## 2023-03-15 ENCOUNTER — Other Ambulatory Visit: Payer: Self-pay | Admitting: Internal Medicine

## 2023-03-15 LAB — CBC
Hematocrit: 31.6 % — ABNORMAL LOW (ref 34.0–46.6)
Hemoglobin: 10.4 g/dL — ABNORMAL LOW (ref 11.1–15.9)
MCH: 31.9 pg (ref 26.6–33.0)
MCHC: 32.9 g/dL (ref 31.5–35.7)
MCV: 97 fL (ref 79–97)
Platelets: 367 10*3/uL (ref 150–450)
RBC: 3.26 x10E6/uL — ABNORMAL LOW (ref 3.77–5.28)
RDW: 13.3 % (ref 11.7–15.4)
WBC: 5.3 10*3/uL (ref 3.4–10.8)

## 2023-03-15 LAB — COMPREHENSIVE METABOLIC PANEL
ALT: 23 IU/L (ref 0–32)
AST: 25 IU/L (ref 0–40)
Albumin: 4.9 g/dL (ref 3.9–4.9)
Alkaline Phosphatase: 116 IU/L (ref 44–121)
BUN/Creatinine Ratio: 18 (ref 12–28)
BUN: 20 mg/dL (ref 8–27)
Bilirubin Total: 0.3 mg/dL (ref 0.0–1.2)
CO2: 18 mmol/L — ABNORMAL LOW (ref 20–29)
Calcium: 11.5 mg/dL — ABNORMAL HIGH (ref 8.7–10.3)
Chloride: 102 mmol/L (ref 96–106)
Creatinine, Ser: 1.1 mg/dL — ABNORMAL HIGH (ref 0.57–1.00)
Globulin, Total: 2.2 g/dL (ref 1.5–4.5)
Glucose: 105 mg/dL — ABNORMAL HIGH (ref 70–99)
Potassium: 4.5 mmol/L (ref 3.5–5.2)
Sodium: 136 mmol/L (ref 134–144)
Total Protein: 7.1 g/dL (ref 6.0–8.5)
eGFR: 54 mL/min/{1.73_m2} — ABNORMAL LOW (ref >59)

## 2023-03-15 LAB — TSH+T4F+T3FREE
Free T4: 1.49 ng/dL (ref 0.82–1.77)
T3, Free: 2.3 pg/mL (ref 2.0–4.4)
TSH: 0.507 u[IU]/mL (ref 0.450–4.500)

## 2023-03-15 LAB — FOLATE: Folate: >20 ng/mL (ref >3.0)

## 2023-03-15 LAB — VITAMIN B12: Vitamin B-12: 525 pg/mL (ref 232–1245)

## 2023-03-16 ENCOUNTER — Telehealth: Payer: Self-pay

## 2023-03-16 ENCOUNTER — Other Ambulatory Visit: Payer: Self-pay | Admitting: Internal Medicine

## 2023-03-16 DIAGNOSIS — F17200 Nicotine dependence, unspecified, uncomplicated: Secondary | ICD-10-CM

## 2023-03-16 DIAGNOSIS — E785 Hyperlipidemia, unspecified: Secondary | ICD-10-CM

## 2023-03-16 DIAGNOSIS — M25562 Pain in left knee: Secondary | ICD-10-CM | POA: Diagnosis not present

## 2023-03-16 DIAGNOSIS — J449 Chronic obstructive pulmonary disease, unspecified: Secondary | ICD-10-CM

## 2023-03-16 DIAGNOSIS — Z122 Encounter for screening for malignant neoplasm of respiratory organs: Secondary | ICD-10-CM

## 2023-03-16 DIAGNOSIS — Z1231 Encounter for screening mammogram for malignant neoplasm of breast: Secondary | ICD-10-CM

## 2023-03-16 NOTE — Telephone Encounter (Signed)
Singed PCS request faxed to [facility]

## 2023-03-16 NOTE — Telephone Encounter (Signed)
Message received from Dr Laural Benes that the patient is requesting PCS.  I called [facility] to inquire if the patient received services when a referral was submitted about a year ago. I spoke to [place] who said they did not approve the patient for services at that time. They received another referral in 08/2022 and they were unable to reach the patient so the referral was closed.  He said a new referral is needed at this time .

## 2023-03-20 ENCOUNTER — Other Ambulatory Visit: Payer: 59

## 2023-03-23 NOTE — Telephone Encounter (Signed)
Pt is calling Erskine Squibb back regarding PCS. Please advise CB- [phone]

## 2023-03-23 NOTE — Telephone Encounter (Signed)
Call returned to patient and number provided. Her daughter, Anna Mcguire, answered and she was not with the patient.  She said her mother was probably calling to reschedule an appointment she missed last week. I informed her that it was an appointment for lab work and an appointment is not needed, she can just come to the lab.  [name] said she would let her know about the lab and also let her know I was returning her call.

## 2023-03-25 ENCOUNTER — Ambulatory Visit: Payer: 59 | Attending: Internal Medicine

## 2023-03-25 ENCOUNTER — Encounter: Payer: Self-pay | Admitting: Family Medicine

## 2023-03-25 ENCOUNTER — Ambulatory Visit: Payer: 59 | Attending: Family Medicine | Admitting: Family Medicine

## 2023-03-25 VITALS — BP 145/64 | HR 72 | Ht 66.0 in | Wt 111.8 lb

## 2023-03-25 DIAGNOSIS — R296 Repeated falls: Secondary | ICD-10-CM

## 2023-03-25 DIAGNOSIS — R109 Unspecified abdominal pain: Secondary | ICD-10-CM

## 2023-03-25 DIAGNOSIS — M1712 Unilateral primary osteoarthritis, left knee: Secondary | ICD-10-CM

## 2023-03-25 DIAGNOSIS — R10A2 Flank pain, left side: Secondary | ICD-10-CM

## 2023-03-25 MED ORDER — TRAMADOL HCL 50 MG PO TABS: 50.0000 mg | ORAL_TABLET | Freq: Two times a day (BID) | ORAL | 0 refills | Status: DC | PRN

## 2023-03-25 MED ORDER — METHOCARBAMOL 500 MG PO TABS: 500.0000 mg | ORAL_TABLET | Freq: Two times a day (BID) | ORAL | 0 refills | Status: DC | PRN

## 2023-03-25 NOTE — Progress Notes (Signed)
Subjective:  Patient ID: Anna Mcguire, female    DOB: 09-Jan-1956  Age: 67 y.o. MRN: [id number]  CC: Fall (Pain on left side after fall.)   HPI Anna Mcguire is a 67 y.o. year old female patient of Dr. Laural Benes with a history of hypertension, left knee osteoarthritis, COPD, stage III CKD, hyperlipidemia. She had a visit for chronic disease management 2 weeks ago with her PCP.  Interval History:     Today she presents for an acute visit after 4 yesterday while trying to get off the toilet seat.  She left knee gave out and she fell and hit her left knee and slid and hit her left buttock and left side of her face.  Denies losing consciousness.  She is currently experiencing pain in her left knee, left side of her abdomen, left shoulder. She has had recurrent falls and mentioned this to her PCP.  PCS services were ordered for her. She is being evaluated by orthopedic for a left knee replacement.    Past Medical History:  Diagnosis Date   Acid reflux    Arthritis    Hypertension     Past Surgical History:  Procedure Laterality Date   COLONOSCOPY WITH PROPOFOL N/A 06/07/2014   Procedure: COLONOSCOPY WITH PROPOFOL;  Surgeon: Willis Modena, MD;  Location: [facility];  Service: Endoscopy;  Laterality: N/A;   ESOPHAGOGASTRODUODENOSCOPY (EGD) WITH PROPOFOL N/A 06/07/2014   Procedure: ESOPHAGOGASTRODUODENOSCOPY (EGD) WITH PROPOFOL;  Surgeon: Willis Modena, MD;  Location: [facility];  Service: Endoscopy;  Laterality: N/A;    Family History  Problem Relation Age of Onset   Kidney failure Mother    Aneurysm Father     Social History   Socioeconomic History   Marital status: Single    Spouse name: Not on file   Number of children: Not on file   Years of education: Not on file   Highest education level: Not on file  Occupational History   Not on file  Tobacco Use   Smoking status: Every Day    Current packs/day: 0.25    Average packs/day: 0.3 packs/day for  48.0 years (12.0 ttl pk-yrs)    Types: Cigarettes   Smokeless tobacco: Never   Tobacco comments:    3 times a week - 10/24/2020    Buproprion helps  Vaping Use   Vaping status: Never Used  Substance and Sexual Activity   Alcohol use: Yes    Comment: has an alcoholic beverage 3 days/week   Drug use: Yes    Types: Marijuana   Sexual activity: Not Currently  Other Topics Concern   Not on file  Social History Narrative   Retired   International aid/development worker of [employer] Strain: Not on file  Food Insecurity: Not on file  Transportation Needs: Not on file  Physical Activity: Not on file  Stress: Not on file  Social Connections: Not on file    Allergies  Allergen Reactions   Chantix [Varenicline Tartrate]     Diarrhea and dizziness   Penicillins Diarrhea    Outpatient Medications Prior to Visit  Medication Sig Dispense Refill   albuterol (PROVENTIL) (2.5 MG/3ML) 0.083% nebulizer solution inhale THE contents of 1 vial PER nebulizer EVERY 6 HOURS AS NEEDED FOR wheezing OR SHORTNESS OF BREATH 150 mL 5   albuterol (VENTOLIN HFA) 108 (90 Base) MCG/ACT inhaler inhale 2 puffs BY MOUTH EVERY 6 HOURS AS NEEDED FOR wheezing OR shortness of breath 8.5 g 0  amLODipine (NORVASC) 10 MG tablet TAKE 1 Tablet BY MOUTH ONCE DAILY FOR BLOOD PRESSURE 90 tablet 1   atorvastatin (LIPITOR) 80 MG tablet TAKE 1 Tablet BY MOUTH ONCE DAILY 90 tablet 1   diclofenac Sodium (VOLTAREN) 1 % GEL Apply 2 g topically 4 (four) times daily. 100 g 1   DULERA 100-5 MCG/ACT AERO INHALE 2 PUFFS BY MOUTH TWICE DAILY 13 g 6   ferrous sulfate 325 (65 FE) MG tablet Take 1 tablet (325 mg total) by mouth daily with breakfast. 100 tablet 1   hydrALAZINE (APRESOLINE) 10 MG tablet TAKE 1 Tablet BY MOUTH THREE TIMES DAILY 270 tablet 1   irbesartan (AVAPRO) 75 MG tablet Take 1 tablet (75 mg total) by mouth daily. 90 tablet 1   loratadine (CLARITIN) 10 MG tablet Take 1 tablet (10 mg total) by mouth daily. 90 tablet 2    methocarbamol (ROBAXIN) 500 MG tablet Take 1 tablet (500 mg total) by mouth 3 (three) times daily as needed for muscle spasms. 10 tablet 0   pantoprazole (PROTONIX) 40 MG tablet 1 tab Po twice a day x 1 month then 1 tab daily 120 tablet 1   sertraline (ZOLOFT) 25 MG tablet Take 1 tablet (25 mg total) by mouth daily. 30 tablet 2   tiotropium (SPIRIVA HANDIHALER) 18 MCG inhalation capsule PLACE 1 CAPSULE INTO INHALER AND INHALE DAILY 30 capsule 1   traMADol (ULTRAM) 50 MG tablet Take 1 tablet (50 mg total) by mouth 2 (two) times daily as needed. 30 tablet 0   triamcinolone cream (KENALOG) 0.1 % Apply 1 Application topically 2 (two) times daily. 30 g 1   No facility-administered medications prior to visit.     ROS Review of Systems  Constitutional:  Negative for activity change and appetite change.  HENT:  Negative for sinus pressure and sore throat.   Respiratory:  Negative for chest tightness, shortness of breath and wheezing.   Cardiovascular:  Negative for chest pain and palpitations.  Gastrointestinal:  Negative for abdominal distention, abdominal pain and constipation.  Genitourinary: Negative.   Musculoskeletal:        See HPI  Psychiatric/Behavioral:  Negative for behavioral problems and dysphoric mood.     Objective:  BP (!) 145/64   Pulse 72   Ht 5\' 6"  (1.676 m)   Wt 111 lb 12.8 oz (50.7 kg)   SpO2 99%   BMI 18.04 kg/m      03/25/2023    2:44 PM 03/13/2023    9:21 AM 02/24/2023    1:28 PM  BP/Weight  Systolic BP 145 126 151  Diastolic BP 64 62 72  Wt. (Lbs) 111.8 106   BMI 18.04 kg/m2 17.11 kg/m2       Physical Exam Constitutional:      Appearance: She is well-developed.  Cardiovascular:     Rate and Rhythm: Normal rate.     Heart sounds: Normal heart sounds. No murmur heard. Pulmonary:     Effort: Pulmonary effort is normal.     Breath sounds: Normal breath sounds. No wheezing or rales.  Chest:     Chest wall: No tenderness.  Abdominal:     General:  Bowel sounds are normal. There is no distension.     Palpations: Abdomen is soft. There is no mass.     Tenderness: There is abdominal tenderness (slight left abdominal wall and left side TTP).  Musculoskeletal:     Right lower leg: No edema.     Left lower leg:  No edema.     Comments: Left knee genu valgus Slight tenderness on palpation of left shoulder, left knee  Neurological:     Mental Status: She is alert and oriented to person, place, and time.  Psychiatric:        Mood and Affect: Mood normal.        Latest Ref Rng & Units 03/13/2023   10:55 AM 08/05/2022    9:43 AM 03/02/2022    3:37 PM  CMP  Glucose 70 - 99 mg/dL 295  86  284   BUN 8 - 27 mg/dL 20  19  22    Creatinine 0.57 - 1.00 mg/dL 1.32  4.40  1.02   Sodium 134 - 144 mmol/L 136  140  139   Potassium 3.5 - 5.2 mmol/L 4.5  4.4  4.4   Chloride 96 - 106 mmol/L 102  106  110   CO2 20 - 29 mmol/L 18  19  23    Calcium 8.7 - 10.3 mg/dL 72.5  36.6  44.0   Total Protein 6.0 - 8.5 g/dL 7.1   6.5   Total Bilirubin 0.0 - 1.2 mg/dL 0.3   0.3   Alkaline Phos 44 - 121 IU/L 116   115   AST 0 - 40 IU/L 25   27   ALT 0 - 32 IU/L 23   25     Lipid Panel     Component Value Date/Time   CHOL 155 03/20/2021 1454   TRIG 258 (H) 03/20/2021 1454   HDL 82 03/20/2021 1454   CHOLHDL 1.9 03/20/2021 1454   CHOLHDL 1.5 01/15/2020 0258   VLDL 23 01/15/2020 0258   LDLCALC 35 03/20/2021 1454    CBC    Component Value Date/Time   WBC 5.3 03/13/2023 1055   WBC 4.7 03/02/2022 1537   RBC 3.26 (L) 03/13/2023 1055   RBC 3.02 (L) 03/02/2022 1537   HGB 10.4 (L) 03/13/2023 1055   HCT 31.6 (L) 03/13/2023 1055   PLT 367 03/13/2023 1055   MCV 97 03/13/2023 1055   MCH 31.9 03/13/2023 1055   MCH 32.8 03/02/2022 1537   MCHC 32.9 03/13/2023 1055   MCHC 32.5 03/02/2022 1537   RDW 13.3 03/13/2023 1055   LYMPHSABS 1.4 03/02/2022 1537   MONOABS 0.6 03/02/2022 1537   EOSABS 0.2 03/02/2022 1537   BASOSABS 0.1 03/02/2022 1537    Lab Results   Component Value Date   HGBA1C 5.2 07/29/2019    Assessment & Plan:  1. Primary osteoarthritis of left knee She is due for left knee replacement She has an upcoming appointment with orthopedic tomorrow to discuss definitive management as this has been the etiology of her multiple falls - traMADol (ULTRAM) 50 MG tablet; Take 1 tablet (50 mg total) by mouth 2 (two) times daily as needed.  Dispense: 30 tablet; Refill: 0 - methocarbamol (ROBAXIN) 500 MG tablet; Take 1 tablet (500 mg total) by mouth 2 (two) times daily as needed for muscle spasms.  Dispense: 60 tablet; Refill: 0  2. Acute left flank pain She had complained of this at her PCP visit and unfortunately suffered another acute event Will add on methocarbamol Advised to apply heat as pain is likely musculoskeletal Sister will assist her with this - traMADol (ULTRAM) 50 MG tablet; Take 1 tablet (50 mg total) by mouth 2 (two) times daily as needed.  Dispense: 30 tablet; Refill: 0 - methocarbamol (ROBAXIN) 500 MG tablet; Take 1 tablet (500 mg total) by mouth  2 (two) times daily as needed for muscle spasms.  Dispense: 60 tablet; Refill: 0  3. Recurrent falls She is at high risk for recurrent falls due to instability from left knee osteoarthritis Fall precautions She is in the process of obtaining PCS services which will be beneficial At the moment she is coherent and has no signs or symptoms of CNS abnormality hence no imaging of the brain indicated - methocarbamol (ROBAXIN) 500 MG tablet; Take 1 tablet (500 mg total) by mouth 2 (two) times daily as needed for muscle spasms.  Dispense: 60 tablet; Refill: 0                No orders of the defined types were placed in this encounter.   Follow-up: No follow-ups on file.       Hoy Register, MD, FAAFP. [facility] [place], [place] [phone]   03/25/2023, 2:48 PM

## 2023-03-25 NOTE — Patient Instructions (Signed)
Fall Prevention in the Home, Adult Falls can cause injuries and affect people of all ages. There are many simple things that you can do to make your home safe and to help prevent falls. If you need it, ask for help making these changes. What actions can I take to prevent falls? General information Use good lighting in all rooms. Make sure to: Replace any light bulbs that burn out. Turn on lights if it is dark and use night-lights. Keep items that you use often in easy-to-reach places. Lower the shelves around your home if needed. Move furniture so that there are clear paths around it. Do not keep throw rugs or other things on the floor that can make you trip. If any of your floors are uneven, fix them. Add color or contrast paint or tape to clearly mark and help you see: Grab bars or handrails. First and last steps of staircases. Where the edge of each step is. If you use a ladder or stepladder: Make sure that it is fully opened. Do not climb a closed ladder. Make sure the sides of the ladder are locked in place. Have someone hold the ladder while you use it. Know where your pets are as you move through your home. What can I do in the bathroom?     Keep the floor dry. Clean up any water that is on the floor right away. Remove soap buildup in the bathtub or shower. Buildup makes bathtubs and showers slippery. Use non-skid mats or decals on the floor of the bathtub or shower. Attach bath mats securely with double-sided, non-slip rug tape. If you need to sit down while you are in the shower, use a non-slip stool. Install grab bars by the toilet and in the bathtub and shower. Do not use towel bars as grab bars. What can I do in the bedroom? Make sure that you have a light by your bed that is easy to reach. Do not use any sheets or blankets on your bed that hang to the floor. Have a firm bench or chair with side arms that you can use for support when you get dressed. What can I do in  the kitchen? Clean up any spills right away. If you need to reach something above you, use a sturdy step stool that has a grab bar. Keep electrical cables out of the way. Do not use floor polish or wax that makes floors slippery. What can I do with my stairs? Do not leave anything on the stairs. Make sure that you have a light switch at the top and the bottom of the stairs. Have them installed if you do not have them. Make sure that there are handrails on both sides of the stairs. Fix handrails that are broken or loose. Make sure that handrails are as long as the staircases. Install non-slip stair treads on all stairs in your home if they do not have carpet. Avoid having throw rugs at the top or bottom of stairs, or secure the rugs with carpet tape to prevent them from moving. Choose a carpet design that does not hide the edge of steps on the stairs. Make sure that carpet is firmly attached to the stairs. Fix any carpet that is loose or worn. What can I do on the outside of my home? Use bright outdoor lighting. Repair the edges of walkways and driveways and fix any cracks. Clear paths of anything that can make you trip, such as tools or rocks. Add  color or contrast paint or tape to clearly mark and help you see high doorway thresholds. Trim any bushes or trees on the main path into your home. Check that handrails are securely fastened and in good repair. Both sides of all steps should have handrails. Install guardrails along the edges of any raised decks or porches. Have leaves, snow, and ice cleared regularly. Use sand, salt, or ice melt on walkways during winter months if you live where there is ice and snow. In the garage, clean up any spills right away, including grease or oil spills. What other actions can I take? Review your medicines with your health care provider. Some medicines can make you confused or feel dizzy. This can increase your chance of falling. Wear closed-toe shoes that  fit well and support your feet. Wear shoes that have rubber soles and low heels. Use a cane, walker, scooter, or crutches that help you move around if needed. Talk with your provider about other ways that you can decrease your risk of falls. This may include seeing a physical therapist to learn to do exercises to improve movement and strength. Where to find more information Centers for Disease Control and Prevention, STEADI: TonerPromos.no [employer] on Aging: BaseRingTones.pl National Institute on Aging: BaseRingTones.pl Contact a health care provider if: You are afraid of falling at home. You feel weak, drowsy, or dizzy at home. You fall at home. Get help right away if you: Lose consciousness or have trouble moving after a fall. Have a fall that causes a head injury. These symptoms may be an emergency. Get help right away. Call 911. Do not wait to see if the symptoms will go away. Do not drive yourself to the hospital. This information is not intended to replace advice given to you by your health care provider. Make sure you discuss any questions you have with your health care provider. Document Revised: 02/24/2022 Document Reviewed: 02/24/2022 Elsevier Patient Education  2024 [employer].

## 2023-03-26 ENCOUNTER — Encounter: Payer: Self-pay | Admitting: Internal Medicine

## 2023-03-26 ENCOUNTER — Other Ambulatory Visit: Payer: Self-pay | Admitting: Internal Medicine

## 2023-03-26 ENCOUNTER — Encounter: Payer: Self-pay | Admitting: Gastroenterology

## 2023-03-26 DIAGNOSIS — E210 Primary hyperparathyroidism: Secondary | ICD-10-CM | POA: Insufficient documentation

## 2023-03-26 DIAGNOSIS — M25562 Pain in left knee: Secondary | ICD-10-CM | POA: Diagnosis not present

## 2023-03-26 LAB — PTH, INTACT AND CALCIUM
Calcium: 11.1 mg/dL — ABNORMAL HIGH (ref 8.7–10.3)
PTH: 76 pg/mL — ABNORMAL HIGH (ref 15–65)

## 2023-03-26 LAB — VITAMIN D 25 HYDROXY (VIT D DEFICIENCY, FRACTURES): Vit D, 25-Hydroxy: 26.5 ng/mL — ABNORMAL LOW (ref 30.0–100.0)

## 2023-03-26 MED ORDER — VITAMIN D (CHOLECALCIFEROL) 10 MCG (400 UNIT) PO CAPS
400.0000 [IU] | ORAL_CAPSULE | Freq: Every day | ORAL | 1 refills | Status: AC
Start: 2023-03-26 — End: ?

## 2023-03-29 ENCOUNTER — Telehealth: Payer: Self-pay | Admitting: Internal Medicine

## 2023-03-30 ENCOUNTER — Telehealth: Payer: Self-pay | Admitting: Gastroenterology

## 2023-03-30 NOTE — Telephone Encounter (Signed)
Inbound call from Dr. Henriette Combs office from [facility]. Placed an urgent referral for black tarry stools. They stated patient has been having recurrent falls and needs surgery. They also stated the patient would not be able to get her surgery scheduled until she had all of her work ups completed. Stated Dr. Laural Benes is requesting for patient to be seen ASAP, so she can have her surgery completed. Please advise.

## 2023-03-30 NOTE — Telephone Encounter (Signed)
Patient GI referral chang to Urgent as requested by GI office. Spoke with [place] office is aware that order has been changed

## 2023-03-31 NOTE — Telephone Encounter (Signed)
I put her with an APP next week 04/07/23. I did not attach it to the referral. Would you contact the patient and attach the appointment to the referral please?

## 2023-03-31 NOTE — Telephone Encounter (Signed)
03/30/2023 Late entry: 4:10 PM Call placed to patient unable to reach message left on VM.

## 2023-03-31 NOTE — Telephone Encounter (Signed)
Call placed to patient unable to reach message left on VM.

## 2023-03-31 NOTE — Telephone Encounter (Signed)
Letter mailed to patient.

## 2023-03-31 NOTE — Telephone Encounter (Signed)
Call other available number in patient chart. Unable to reach or leave message.

## 2023-04-07 ENCOUNTER — Ambulatory Visit (INDEPENDENT_AMBULATORY_CARE_PROVIDER_SITE_OTHER): Payer: 59 | Admitting: Gastroenterology

## 2023-04-07 ENCOUNTER — Other Ambulatory Visit: Payer: Self-pay | Admitting: Internal Medicine

## 2023-04-07 VITALS — BP 126/72 | HR 86 | Ht 66.0 in | Wt 112.0 lb

## 2023-04-07 DIAGNOSIS — R1111 Vomiting without nausea: Secondary | ICD-10-CM | POA: Diagnosis not present

## 2023-04-07 DIAGNOSIS — M1712 Unilateral primary osteoarthritis, left knee: Secondary | ICD-10-CM

## 2023-04-07 DIAGNOSIS — R634 Abnormal weight loss: Secondary | ICD-10-CM

## 2023-04-07 DIAGNOSIS — L299 Pruritus, unspecified: Secondary | ICD-10-CM

## 2023-04-07 DIAGNOSIS — R109 Unspecified abdominal pain: Secondary | ICD-10-CM

## 2023-04-07 DIAGNOSIS — R194 Change in bowel habit: Secondary | ICD-10-CM

## 2023-04-07 DIAGNOSIS — D649 Anemia, unspecified: Secondary | ICD-10-CM

## 2023-04-07 MED ORDER — NA SULFATE-K SULFATE-MG SULF 17.5-3.13-1.6 GM/177ML PO SOLN: 1.0000 | Freq: Once | ORAL | 0 refills | Status: AC

## 2023-04-07 NOTE — Patient Instructions (Signed)
You have been scheduled for an endoscopy and colonoscopy. Please follow the written instructions given to you at your visit today.  Please pick up your prep supplies at the pharmacy within the next 1-3 days.  If you use inhalers (even only as needed), please bring them with you on the day of your procedure.  DO NOT TAKE 7 DAYS PRIOR TO TEST- Trulicity (dulaglutide) Ozempic, Wegovy (semaglutide) Mounjaro (tirzepatide) Bydureon Bcise (exanatide extended release)  DO NOT TAKE 1 DAY PRIOR TO YOUR TEST Rybelsus (semaglutide) Adlyxin (lixisenatide) Victoza (liraglutide) Byetta (exanatide) ___________________________________________________________________________  _______________________________________________________  If your blood pressure at your visit was 140/90 or greater, please contact your primary care physician to follow up on this.  _______________________________________________________  If you are age 69 or older, your body mass index should be between 23-30. Your Body mass index is 18.08 kg/m. If this is out of the aforementioned range listed, please consider follow up with your Primary Care Provider.  If you are age 21 or younger, your body mass index should be between 19-25. Your Body mass index is 18.08 kg/m. If this is out of the aformentioned range listed, please consider follow up with your Primary Care Provider.   ________________________________________________________  The [facility] providers would like to encourage you to use [employer] to communicate with providers for non-urgent requests or questions.  Due to long hold times on the telephone, sending your provider a message by [facility] may be a faster and more efficient way to get a response.  Please allow 48 business hours for a response.  Please remember that this is for non-urgent requests.  _______________________________________________________

## 2023-04-07 NOTE — Progress Notes (Unsigned)
04/08/2023 Anna Mcguire [id number] [birth year]   HISTORY OF PRESENT ILLNESS: This is a 67 year old female who is new to our office.  She has been referred here by her PCP, Dr. Laural Benes, for evaluation of black stools.  She tells me that her stools were black, but she was on iron before and when she stopped the iron supplements the black stools resolved.  She says that her bowel movements do alternate between constipation where she will not have a bowel movement for a few days and then to loose or watery stools.  She tells me that she thinks she has lost about 12 pounds in the past 2 or 3 months, looks like documented in our system of about 8 pounds since April.  Says that she has intermittent episodes of vomiting when she eats.  She had been on the pantoprazole daily ongoingly, but says it was recently increased to twice daily in about August.  She is anemic with a hemoglobin of 10.4 g and MCV that is normal, appears that this anemia is chronic.  Most recent iron studies are normal.  She denies seeing any red blood in her stools.  No NSAID use.  No abdominal pain.  She had an EGD and colonoscopy in December 2015 by Dr. Dulce Sellar.  At that time colonoscopy revealed only hemorrhoids and scattered diverticulosis.  EGD was normal at that time.   Past Medical History:  Diagnosis Date   Acid reflux    Arthritis    Hypertension    Past Surgical History:  Procedure Laterality Date   COLONOSCOPY WITH PROPOFOL N/A 06/07/2014   Procedure: COLONOSCOPY WITH PROPOFOL;  Surgeon: Willis Modena, MD;  Location: [facility];  Service: Endoscopy;  Laterality: N/A;   ESOPHAGOGASTRODUODENOSCOPY (EGD) WITH PROPOFOL N/A 06/07/2014   Procedure: ESOPHAGOGASTRODUODENOSCOPY (EGD) WITH PROPOFOL;  Surgeon: Willis Modena, MD;  Location: [facility];  Service: Endoscopy;  Laterality: N/A;    reports that she has been smoking cigarettes. She has a 12 pack-year smoking history. She has never used smokeless tobacco.  She reports current alcohol use. She reports current drug use. Drug: Marijuana. family history includes Aneurysm in her father; Kidney failure in her mother. Allergies  Allergen Reactions   Chantix [Varenicline Tartrate]     Diarrhea and dizziness   Penicillins Diarrhea      Outpatient Encounter Medications as of 04/07/2023  Medication Sig   albuterol (PROVENTIL) (2.5 MG/3ML) 0.083% nebulizer solution inhale THE contents of 1 vial PER nebulizer EVERY 6 HOURS AS NEEDED FOR wheezing OR SHORTNESS OF BREATH   albuterol (VENTOLIN HFA) 108 (90 Base) MCG/ACT inhaler inhale 2 puffs BY MOUTH EVERY 6 HOURS AS NEEDED FOR wheezing OR shortness of breath   amLODipine (NORVASC) 10 MG tablet TAKE 1 Tablet BY MOUTH ONCE DAILY FOR BLOOD PRESSURE   atorvastatin (LIPITOR) 80 MG tablet TAKE 1 Tablet BY MOUTH ONCE DAILY   DULERA 100-5 MCG/ACT AERO INHALE 2 PUFFS BY MOUTH TWICE DAILY   ferrous sulfate 325 (65 FE) MG tablet Take 1 tablet (325 mg total) by mouth daily with breakfast.   irbesartan (AVAPRO) 75 MG tablet Take 1 tablet (75 mg total) by mouth daily.   [EXPIRED] Na Sulfate-K Sulfate-Mg Sulf 17.5-3.13-1.6 GM/177ML SOLN Take 1 kit by mouth once for 1 dose.   pantoprazole (PROTONIX) 40 MG tablet Take 40 mg by mouth 2 (two) times daily.   sertraline (ZOLOFT) 25 MG tablet Take 1 tablet (25 mg total) by mouth daily.   tiotropium (SPIRIVA HANDIHALER)  18 MCG inhalation capsule PLACE 1 CAPSULE INTO INHALER AND INHALE DAILY   traMADol (ULTRAM) 50 MG tablet Take 1 tablet (50 mg total) by mouth 2 (two) times daily as needed.   Vitamin D, Cholecalciferol, 10 MCG (400 UNIT) CAPS Take 400 Int'l Units by mouth daily.   [DISCONTINUED] diclofenac Sodium (VOLTAREN) 1 % GEL Apply 2 g topically 4 (four) times daily.   [DISCONTINUED] pantoprazole (PROTONIX) 40 MG tablet 1 tab Po twice a day x 1 month then 1 tab daily   [DISCONTINUED] fluticasone (FLONASE) 50 MCG/ACT nasal spray Place 1 spray into both nostrils daily.    [DISCONTINUED] hydrALAZINE (APRESOLINE) 10 MG tablet TAKE 1 Tablet BY MOUTH THREE TIMES DAILY   [DISCONTINUED] lisinopril-hydrochlorothiazide (PRINZIDE,ZESTORETIC) 20-12.5 MG per tablet Take 1 tablet by mouth daily.   [DISCONTINUED] loratadine (CLARITIN) 10 MG tablet Take 1 tablet (10 mg total) by mouth daily.   [DISCONTINUED] lovastatin (MEVACOR) 20 MG tablet Take 20 mg by mouth daily at 6 PM.   [DISCONTINUED] methocarbamol (ROBAXIN) 500 MG tablet Take 1 tablet (500 mg total) by mouth 2 (two) times daily as needed for muscle spasms.   [DISCONTINUED] triamcinolone cream (KENALOG) 0.1 % Apply 1 Application topically 2 (two) times daily.   No facility-administered encounter medications on file as of 04/07/2023.     REVIEW OF SYSTEMS  : All other systems reviewed and negative except where noted in the History of Present Illness.   PHYSICAL EXAM: BP 126/72   Pulse 86   Ht 5\' 6"  (1.676 m)   Wt 112 lb (50.8 kg)   BMI 18.08 kg/m  General: Well developed female in no acute distress Head: Normocephalic and atraumatic Eyes:  Sclerae anicteric, conjunctiva pink. Ears: Normal auditory acuity Lungs: Course breath sounds noted B/L Heart: Regular rate and rhythm; no M/R/G. Abdomen: Soft, non-distended.  BS present.  Non-tender. Rectal:  Will be done at the time of colonoscopy. Musculoskeletal: Symmetrical with no gross deformities  Skin: No lesions on visible extremities Extremities: No edema  Neurological: Alert oriented x 4, grossly non-focal Psychological:  Alert and cooperative. Normal mood and affect  ASSESSMENT AND PLAN: *Normocytic anemia with a hemoglobin of 10.4 g.  Hemoglobin is stable and anemia appears chronic.  MCV is normal and iron studies are normal.  Reported black stools previously, but that she was on iron before and when she stopped the iron the black stools stopped. *Episodes of vomiting: Has been on pantoprazole daily for a long time, increase to twice daily in about  August. *Altered bowel habits, changing from constipation to diarrhea/loose stools. *Weight loss: She reports about 12 pound weight loss in the last 2 to 3 months.  Documented looks like maybe 8 pound weight loss since April in our system.  **Last EGD and colonoscopy almost 9 years ago.  Will proceed with EGD and colonoscopy with Dr. Tomasa Rand.  The risks, benefits, and alternatives to EGD and colonoscopy were discussed with the patient and she consents to proceed. **If EGD and colonoscopy unrevealing and weight loss continues would consider CT scan of the abdomen and pelvis with possibly chest as well.  CC:  Marcine Matar, MD

## 2023-04-08 ENCOUNTER — Encounter: Payer: Self-pay | Admitting: Gastroenterology

## 2023-04-08 DIAGNOSIS — D649 Anemia, unspecified: Secondary | ICD-10-CM | POA: Insufficient documentation

## 2023-04-08 DIAGNOSIS — R634 Abnormal weight loss: Secondary | ICD-10-CM | POA: Insufficient documentation

## 2023-04-08 DIAGNOSIS — R1111 Vomiting without nausea: Secondary | ICD-10-CM | POA: Insufficient documentation

## 2023-04-08 DIAGNOSIS — R194 Change in bowel habit: Secondary | ICD-10-CM | POA: Insufficient documentation

## 2023-04-08 NOTE — Progress Notes (Signed)
Agree with the assessment and plan as outlined by Jessica Zehr, PA-C.  Saulo Anthis E. Darcel Frane, MD  [facility]  

## 2023-04-08 NOTE — Telephone Encounter (Signed)
Requested Prescriptions  Pending Prescriptions Disp Refills   diclofenac Sodium (VOLTAREN) 1 % GEL [Pharmacy Med Name: diclofenac 1 % topical gel] 100 g 1    Sig: apply 2 grams TO THE affected AREA FOUR TIMES DAILY     Analgesics:  Topicals Failed - 04/07/2023  2:19 PM      Failed - Manual Review: Labs are only required if the patient has taken medication for more than 8 weeks.      Failed - HGB in normal range and within 360 days    Hemoglobin  Date Value Ref Range Status  03/13/2023 10.4 (L) 11.1 - 15.9 g/dL Final         Failed - HCT in normal range and within 360 days    Hematocrit  Date Value Ref Range Status  03/13/2023 31.6 (L) 34.0 - 46.6 % Final         Failed - Cr in normal range and within 360 days    Creatinine, Ser  Date Value Ref Range Status  03/13/2023 1.10 (H) 0.57 - 1.00 mg/dL Final         Passed - PLT in normal range and within 360 days    Platelets  Date Value Ref Range Status  03/13/2023 367 150 - 450 x10E3/uL Final         Passed - eGFR is 30 or above and within 360 days    GFR calc Af Amer  Date Value Ref Range Status  01/30/2020 75 >59 mL/min/1.73 Final    Comment:    **[facility] currently reports eGFR in compliance with the current**   recommendations of the [employer]. [facility] will   update reporting as new guidelines are published from the NKF-ASN   Task force.    GFR, Estimated  Date Value Ref Range Status  03/02/2022 43 (L) >60 mL/min Final    Comment:    (NOTE) Calculated using the CKD-EPI Creatinine Equation (2021)    eGFR  Date Value Ref Range Status  03/13/2023 54 (L) >59 mL/min/1.73 Final         Passed - Patient is not pregnant      Passed - Valid encounter within last 12 months    Recent Outpatient Visits           2 weeks ago Recurrent falls   [facility] Hoy Register, MD   3 weeks ago Primary osteoarthritis of left knee   [facility] Marcine Matar, MD   4 months ago Black stools   [place] [facility] Marcine Matar, MD   7 months ago Need for hepatitis C screening test   [facility] Drucilla Chalet, RPH-CPP   8 months ago Essential hypertension   [place] [facility] Marcine Matar, MD       Future Appointments             In 1 month Laural Benes Binnie Rail, MD [facility]   In 1 month Terri Piedra, [place] [facility] Dermatology             triamcinolone cream (KENALOG) 0.1 % [Pharmacy Med Name: triamcinolone acetonide 0.1 % topical cream] 30 g 1    Sig: APPLY TO THE AFFECTED AREA(S) TWICE DAILY     Not Delegated - Dermatology:  Corticosteroids Failed - 04/07/2023  2:19 PM      Failed - This refill cannot be delegated      Passed - Valid encounter within last 12 months    Recent Outpatient Visits           2 weeks ago Recurrent falls   [place] Hoy Register, MD   3 weeks ago Primary osteoarthritis of left knee   [facility] Marcine Matar, MD   4 months ago Black stools   [facility] Marcine Matar, MD   7 months ago Need for hepatitis C screening test   [facility] Drucilla Chalet, RPH-CPP   8 months ago Essential hypertension   [facility] Marcine Matar, MD       Future Appointments             In 1 month Laural Benes Binnie Rail, MD [facility]   In 1 month Terri Piedra, [facility]erm[facility]

## 2023-04-17 ENCOUNTER — Telehealth: Payer: Self-pay | Admitting: Gastroenterology

## 2023-04-17 MED ORDER — NA SULFATE-K SULFATE-MG SULF 17.5-3.13-1.6 GM/177ML PO SOLN: 1.0000 | Freq: Once | ORAL | 0 refills | Status: AC

## 2023-04-17 NOTE — Telephone Encounter (Signed)
Script sent to new pharmacy

## 2023-04-17 NOTE — Telephone Encounter (Signed)
Inbound call from patient, states pharmacy she chose for her prep medication is currently closed. She would like it resent to [facility] on [address]. Please advise.

## 2023-04-20 ENCOUNTER — Other Ambulatory Visit: Payer: Self-pay | Admitting: Internal Medicine

## 2023-04-20 ENCOUNTER — Other Ambulatory Visit: Payer: Self-pay | Admitting: Family Medicine

## 2023-04-20 DIAGNOSIS — M1712 Unilateral primary osteoarthritis, left knee: Secondary | ICD-10-CM

## 2023-04-20 DIAGNOSIS — J449 Chronic obstructive pulmonary disease, unspecified: Secondary | ICD-10-CM

## 2023-04-20 DIAGNOSIS — R296 Repeated falls: Secondary | ICD-10-CM

## 2023-04-20 DIAGNOSIS — R109 Unspecified abdominal pain: Secondary | ICD-10-CM

## 2023-04-21 ENCOUNTER — Encounter: Payer: Self-pay | Admitting: Gastroenterology

## 2023-04-21 NOTE — Telephone Encounter (Signed)
Requested medication (s) are due for refill today - no  Requested medication (s) are on the active medication list -no  Future visit scheduled -yes  Last refill: unknown  Notes to clinic: medication no longer listed on current medication list- non delegated Rx  Requested Prescriptions  Pending Prescriptions Disp Refills   methocarbamol (ROBAXIN) 500 MG tablet [Pharmacy Med Name: methocarbamol 500 mg tablet] 60 tablet 0    Sig: TAKE 1 Tablet BY MOUTH TWICE DAILY AS NEEDED FOR MUSCLE SPASMS     Not Delegated - Analgesics:  Muscle Relaxants Failed - 04/20/2023  2:47 PM      Failed - This refill cannot be delegated      Passed - Valid encounter within last 6 months    Recent Outpatient Visits           3 weeks ago Recurrent falls   [facility] Hoy Register, MD   1 month ago Primary osteoarthritis of left knee   [facility] Marcine Matar, MD   4 months ago Black stools   [place] Marcine Matar, MD   7 months ago Need for hepatitis C screening test   [facility] [place], Cornelius Moras, RPH-CPP   8 months ago Essential hypertension   [facility] Marcine Matar, MD       Future Appointments             In 3 weeks Marcine Matar, MD [facility]   In 1 month Terri Piedra, [facility] Derm[facility]               Requested Prescriptions  Pending Prescriptions Disp Refills   methocarbamol (ROBAXIN) 500 MG tablet [Pharmacy Med Name: methocarbamol 500 mg tablet] 60 tablet 0    Sig: TAKE 1 Tablet BY MOUTH TWICE DAILY AS NEEDED FOR MUSCLE SPASMS     Not Delegated - Analgesics:  Muscle Relaxants Failed - 04/20/2023  2:47 PM      Failed - This refill cannot be delegated      Passed - Valid encounter within last 6 months    Recent Outpatient  Visits           3 weeks ago Recurrent falls   [place] [facility] Hoy Register, MD   1 month ago Primary osteoarthritis of left knee   [facility] Marcine Matar, MD   4 months ago Black stools   [place] [facility] Marcine Matar, MD   7 months ago Need for hepatitis C screening test   [facility] [place], Cornelius Moras, RPH-CPP   8 months ago Essential hypertension   [place] [facility] Marcine Matar, MD       Future Appointments             In 3 weeks Laural Benes Binnie Rail, MD [facility]   In 1 month Terri Piedra, [place] [facility] Dermatology

## 2023-04-21 NOTE — Telephone Encounter (Signed)
Requested Prescriptions  Pending Prescriptions Disp Refills   albuterol (VENTOLIN HFA) 108 (90 Base) MCG/ACT inhaler [Pharmacy Med Name: albuterol sulfate HFA 90 mcg/actuation aerosol inhaler] 8.5 g 0    Sig: inhale 2 puffs BY MOUTH EVERY 6 HOURS AS NEEDED FOR wheezing OR shortness of breath     Pulmonology:  Beta Agonists 2 Passed - 04/20/2023  2:48 PM      Passed - Last BP in normal range    BP Readings from Last 1 Encounters:  04/07/23 126/72         Passed - Last Heart Rate in normal range    Pulse Readings from Last 1 Encounters:  04/07/23 86         Passed - Valid encounter within last 12 months    Recent Outpatient Visits           3 weeks ago Recurrent falls   [facility] Hoy Register, MD   1 month ago Primary osteoarthritis of left knee   [facility] Marcine Matar, MD   4 months ago Black stools   [place] [facility] Marcine Matar, MD   7 months ago Need for hepatitis C screening test   [facility] [place], Cornelius Moras, RPH-CPP   8 months ago Essential hypertension   [place] [facility] Marcine Matar, MD       Future Appointments             In 3 weeks Marcine Matar, MD [facility]   In 1 month Terri Piedra, [place] [facility] Dermat[facility]             tiotropium (SPIRIVA HANDIHALER) 18 MCG inhalation capsule [Pharmacy Med Name: Spiriva with HandiHaler 18 mcg and inhalation capsules] 90 capsule 0    Sig: PLACE 1 CAPSULE INTO INHALER AND INHALE DAILY     Pulmonology:  Anticholinergic Agents Passed - 04/20/2023  2:48 PM      Passed - Valid encounter within last 12 months    Recent Outpatient Visits           3 weeks ago Recurrent falls   [facility] Hoy Register, MD   1 month ago Primary osteoarthritis  of left knee   [facility] Marcine Matar, MD   4 months ago Black stools   [facility] Marcine Matar, MD   7 months ago Need for hepatitis C screening test   [place] [facility] [place], Cornelius Moras, RPH-CPP   8 months ago Essential hypertension   [facility] Marcine Matar, MD       Future Appointments             In 3 weeks Laural Benes Binnie Rail, MD [facility]   In 1 month Terri Piedra, [place] [facility] Dermatology

## 2023-04-22 ENCOUNTER — Other Ambulatory Visit: Payer: Self-pay | Admitting: Internal Medicine

## 2023-04-22 DIAGNOSIS — E213 Hyperparathyroidism, unspecified: Secondary | ICD-10-CM

## 2023-05-04 ENCOUNTER — Encounter: Payer: Self-pay | Admitting: Gastroenterology

## 2023-05-04 ENCOUNTER — Ambulatory Visit: Payer: 59 | Admitting: Gastroenterology

## 2023-05-04 VITALS — BP 130/66 | HR 78 | Temp 97.3°F | Resp 15 | Ht 66.0 in | Wt 112.0 lb

## 2023-05-04 DIAGNOSIS — R1084 Generalized abdominal pain: Secondary | ICD-10-CM

## 2023-05-04 DIAGNOSIS — D649 Anemia, unspecified: Secondary | ICD-10-CM

## 2023-05-04 DIAGNOSIS — K259 Gastric ulcer, unspecified as acute or chronic, without hemorrhage or perforation: Secondary | ICD-10-CM | POA: Diagnosis not present

## 2023-05-04 DIAGNOSIS — R112 Nausea with vomiting, unspecified: Secondary | ICD-10-CM

## 2023-05-04 DIAGNOSIS — R634 Abnormal weight loss: Secondary | ICD-10-CM

## 2023-05-04 DIAGNOSIS — K5790 Diverticulosis of intestine, part unspecified, without perforation or abscess without bleeding: Secondary | ICD-10-CM | POA: Diagnosis not present

## 2023-05-04 DIAGNOSIS — K921 Melena: Secondary | ICD-10-CM

## 2023-05-04 MED ORDER — SODIUM CHLORIDE 0.9 % IV SOLN: 500.0000 mL | INTRAVENOUS | Status: DC

## 2023-05-04 NOTE — Progress Notes (Signed)
History and Physical Interval Note:  05/04/2023 2:32 PM  Anna Mcguire  has presented today for endoscopic procedure(s), with the diagnosis of  Encounter Diagnosis  Name Primary?   Normocytic anemia Yes  .  The various methods of evaluation and treatment have been discussed with the patient and/or family. After consideration of risks, benefits and other options for treatment, the patient has consented to  the endoscopic procedure(s).   The patient's history has been reviewed, patient examined, no change in status, stable for endoscopic procedure(s).  I have reviewed the patient's chart and labs.  Questions were answered to the patient's satisfaction.     Salomon Ganser E. Tomasa Rand, MD [facility]

## 2023-05-04 NOTE — Patient Instructions (Signed)
**  Handouts given on Diverticulosis and Hemorrhoids**    YOU HAD AN ENDOSCOPIC PROCEDURE TODAY AT THE [facility]:   Refer to the procedure report that was given to you for any specific questions about what was found during the examination.  If the procedure report does not answer your questions, please call your gastroenterologist to clarify.  If you requested that your care partner not be given the details of your procedure findings, then the procedure report has been included in a sealed envelope for you to review at your convenience later.  YOU SHOULD EXPECT: Some feelings of bloating in the abdomen. Passage of more gas than usual.  Walking can help get rid of the air that was put into your GI tract during the procedure and reduce the bloating. If you had a lower endoscopy (such as a colonoscopy or flexible sigmoidoscopy) you may notice spotting of blood in your stool or on the toilet paper. If you underwent a bowel prep for your procedure, you may not have a normal bowel movement for a few days.  Please Note:  You might notice some irritation and congestion in your nose or some drainage.  This is from the oxygen used during your procedure.  There is no need for concern and it should clear up in a day or so.  SYMPTOMS TO REPORT IMMEDIATELY:  Following lower endoscopy (colonoscopy or flexible sigmoidoscopy):  Excessive amounts of blood in the stool  Significant tenderness or worsening of abdominal pains  Swelling of the abdomen that is new, acute  Fever of 100F or higher  Following upper endoscopy (EGD)  Vomiting of blood or coffee ground material  New chest pain or pain under the shoulder blades  Painful or persistently difficult swallowing  New shortness of breath  Fever of 100F or higher  Black, tarry-looking stools  For urgent or emergent issues, a gastroenterologist can be reached at any hour by calling [phone]. Do not use MyChart messaging for urgent concerns.     DIET:  We do recommend a small meal at first, but then you may proceed to your regular diet.  Drink plenty of fluids but you should avoid alcoholic beverages for 24 hours.  ACTIVITY:  You should plan to take it easy for the rest of today and you should NOT DRIVE or use heavy machinery until tomorrow (because of the sedation medicines used during the test).    FOLLOW UP: Our staff will call the number listed on your records the next business day following your procedure.  We will call around 7:15- 8:00 am to check on you and address any questions or concerns that you may have regarding the information given to you following your procedure. If we do not reach you, we will leave a message.     If any biopsies were taken you will be contacted by phone or by letter within the next 1-3 weeks.  Please call us at [phone] if you have not heard about the biopsies in 3 weeks.    SIGNATURES/CONFIDENTIALITY: You and/or your care partner have signed paperwork which will be entered into your electronic medical record.  These signatures attest to the fact that that the information above on your After Visit Summary has been reviewed and is understood.  Full responsibility of the confidentiality of this discharge information lies with you and/or your care-partner.

## 2023-05-04 NOTE — Progress Notes (Signed)
To pacu, VSS. Report to [email or web address] 

## 2023-05-04 NOTE — Op Note (Signed)
[facility] Patient Name: Anna Mcguire Procedure Date: 05/04/2023 2:25 PM MRN: [id number] Endoscopist: Lorin Picket E. Tomasa Rand , MD, [id number] Age: 67 Referring MD:  Date of Birth: [birth year] Gender: Female Account #: [id number] Procedure:                Colonoscopy Indications:              Generalized abdominal pain, Weight loss Medicines:                Monitored Anesthesia Care Procedure:                Pre-Anesthesia Assessment:                           - Prior to the procedure, a History and Physical                            was performed, and patient medications and                            allergies were reviewed. The patient's tolerance of                            previous anesthesia was also reviewed. The risks                            and benefits of the procedure and the sedation                            options and risks were discussed with the patient.                            All questions were answered, and informed consent                            was obtained. Prior Anticoagulants: The patient has                            taken no anticoagulant or antiplatelet agents. ASA                            Grade Assessment: II - A patient with mild systemic                            disease. After reviewing the risks and benefits,                            the patient was deemed in satisfactory condition to                            undergo the procedure.                           After obtaining informed consent, the colonoscope  was passed under direct vision. Throughout the                            procedure, the patient's blood pressure, pulse, and                            oxygen saturations were monitored continuously. The                            Olympus Scope [id number] was introduced through the                            anus and advanced to the the cecum, identified by                             appendiceal orifice and ileocecal valve. The                            colonoscopy was performed without difficulty. The                            patient tolerated the procedure well. The quality                            of the bowel preparation was adequate. The                            ileocecal valve, appendiceal orifice, and rectum                            were photographed. The bowel preparation used was                            SUPREP via split dose instruction. Scope In: 2:51:02 PM Scope Out: 3:06:44 PM Scope Withdrawal Time: 0 hours 12 minutes 16 seconds  Total Procedure Duration: 0 hours 15 minutes 42 seconds  Findings:                 The perianal and digital rectal examinations were                            normal. Pertinent negatives include normal                            sphincter tone and no palpable rectal lesions.                           Many medium-mouthed and small-mouthed diverticula                            were found in the sigmoid colon, descending colon,                            transverse colon, ascending colon and cecum.  The exam was otherwise normal throughout the                            examined colon.                           Non-bleeding internal hemorrhoids were found during                            retroflexion. The hemorrhoids were Grade I                            (internal hemorrhoids that do not prolapse).                           No additional abnormalities were found on                            retroflexion. Complications:            No immediate complications. Estimated Blood Loss:     Estimated blood loss: none. Impression:               - Moderate diverticulosis in the sigmoid colon, in                            the descending colon, in the transverse colon, in                            the ascending colon and in the cecum.                           - Non-bleeding internal hemorrhoids.                            - No specimens collected. Recommendation:           - Patient has a contact number available for                            emergencies. The signs and symptoms of potential                            delayed complications were discussed with the                            patient. Return to normal activities tomorrow.                            Written discharge instructions were provided to the                            patient.                           - Resume previous diet.                           -  Continue present medications.                           - Await pathology results.                           - Recommend daily fiber supplement (Metamucil) to                            reduce risk of diverticular complications and                            improve constipation. [name] E. Tomasa Rand, MD 05/04/2023 3:21:47 PM This report has been signed electronically.

## 2023-05-04 NOTE — Op Note (Signed)
[facility] Patient Name: Anna Mcguire Procedure Date: 05/04/2023 2:33 PM MRN: [id number] Endoscopist: Lorin Picket E. Tomasa Rand , MD, [id number] Age: 67 Referring MD:  Date of Birth: [birth year] Gender: Female Account #: [id number] Procedure:                Upper GI endoscopy Indications:              Generalized abdominal pain, Melena, Nausea with                            vomiting, Weight loss Medicines:                Monitored Anesthesia Care Procedure:                Pre-Anesthesia Assessment:                           - Prior to the procedure, a History and Physical                            was performed, and patient medications and                            allergies were reviewed. The patient's tolerance of                            previous anesthesia was also reviewed. The risks                            and benefits of the procedure and the sedation                            options and risks were discussed with the patient.                            All questions were answered, and informed consent                            was obtained. Prior Anticoagulants: The patient has                            taken no anticoagulant or antiplatelet agents. ASA                            Grade Assessment: II - A patient with mild systemic                            disease. After reviewing the risks and benefits,                            the patient was deemed in satisfactory condition to                            undergo the procedure.  After obtaining informed consent, the endoscope was                            passed under direct vision. Throughout the                            procedure, the patient's blood pressure, pulse, and                            oxygen saturations were monitored continuously. The                            Olympus Scope [id number] was introduced through the                            mouth, and advanced to  the third part of duodenum.                            The upper GI endoscopy was accomplished without                            difficulty. The patient tolerated the procedure                            well. Scope In: Scope Out: Findings:                 The examined portions of the nasopharynx,                            oropharynx and larynx were normal.                           The Z-line was irregular.                           The exam of the esophagus was otherwise normal.                           Localized mild mucosal changes characterized by                            erythema and granularity were found on the greater                            curvature of the gastric antrum/distal body.                            Biopsies were taken with a cold forceps for                            Helicobacter pylori testing. Estimated blood loss                            was minimal.  A small scar was found in the prepyloric region of                            the stomach.                           The exam of the stomach was otherwise normal.                           A small hiatal hernia was present.                           The examined duodenum was normal. Complications:            No immediate complications. Estimated Blood Loss:     Estimated blood loss was minimal. Impression:               - The examined portions of the nasopharynx,                            oropharynx and larynx were normal.                           - Z-line irregular.                           - Erythematous and granular mucosa in the greater                            curvature of the gastric antrum. Biopsied.                           - Scar in the prepyloric region of the stomach.                            Likely related to history of gastric ulcer.                           - Small hiatal hernia.                           - Normal examined duodenum.                            - No obvious endoscopic abnormalities to explain                            the patient's symptoms Recommendation:           - Patient has a contact number available for                            emergencies. The signs and symptoms of potential                            delayed complications were discussed with the  patient. Return to normal activities tomorrow.                            Written discharge instructions were provided to the                            patient.                           - Resume previous diet.                           - Continue present medications.                           - Await pathology results. Charlesetta Milliron E. Tomasa Rand, MD 05/04/2023 3:16:37 PM This report has been signed electronically.

## 2023-05-04 NOTE — Progress Notes (Signed)
Called to room to assist during endoscopic procedure.  Patient ID and intended procedure confirmed with present staff. Received instructions for my participation in the procedure from the performing physician.  

## 2023-05-05 ENCOUNTER — Telehealth: Payer: Self-pay | Admitting: *Deleted

## 2023-05-05 NOTE — Telephone Encounter (Signed)
Post procedure follow up call placed, no answer and left VM.  

## 2023-05-05 NOTE — Telephone Encounter (Signed)
Patient called back stated that she is doing well and that she is a little constipated.

## 2023-05-07 ENCOUNTER — Ambulatory Visit: Payer: 59

## 2023-05-07 LAB — SURGICAL PATHOLOGY

## 2023-05-11 ENCOUNTER — Encounter: Payer: Self-pay | Admitting: Gastroenterology

## 2023-05-12 ENCOUNTER — Telehealth: Payer: Self-pay | Admitting: Internal Medicine

## 2023-05-12 NOTE — Telephone Encounter (Signed)
-----   Message from Salem Blas sent at 05/12/2023 10:49 AM EST ----- Regarding: RE: Endocrinelogy Good Morning  Dr  Lol[facility]  is taking  long to schedule  an appointment  and I sent it to [facility]  and patient declined she wants to be seen in [place] . ----- Message ----- From: Marcine Matar, MD Sent: 04/22/2023   5:22 PM EST To: Dionne Bucy Subject: Endocrinelogy                                  Please try to get her in ASAP.

## 2023-05-13 ENCOUNTER — Telehealth: Payer: Self-pay | Admitting: Internal Medicine

## 2023-05-13 NOTE — Telephone Encounter (Signed)
-----   Message from Chalmers Blas sent at 05/13/2023 10:24 AM EST ----- Regarding: RE: Endocrinelogy Appt Patient  schedule with  [facility]   05/28/23  @ 9:45am ----- Message ----- From: Marcine Matar, MD Sent: 04/22/2023   5:22 PM EST To: Dionne Bucy Subject: Endocrinelogy                                  Please try to get her in ASAP.

## 2023-05-14 ENCOUNTER — Ambulatory Visit: Payer: 59 | Attending: Internal Medicine | Admitting: Internal Medicine

## 2023-05-14 ENCOUNTER — Encounter: Payer: Self-pay | Admitting: Internal Medicine

## 2023-05-14 VITALS — BP 126/66 | HR 70 | Temp 98.2°F | Ht 66.0 in | Wt 110.0 lb

## 2023-05-14 DIAGNOSIS — F17200 Nicotine dependence, unspecified, uncomplicated: Secondary | ICD-10-CM

## 2023-05-14 DIAGNOSIS — D649 Anemia, unspecified: Secondary | ICD-10-CM | POA: Diagnosis not present

## 2023-05-14 DIAGNOSIS — R202 Paresthesia of skin: Secondary | ICD-10-CM

## 2023-05-14 DIAGNOSIS — M1712 Unilateral primary osteoarthritis, left knee: Secondary | ICD-10-CM

## 2023-05-14 DIAGNOSIS — I10 Essential (primary) hypertension: Secondary | ICD-10-CM | POA: Diagnosis not present

## 2023-05-14 DIAGNOSIS — Z1231 Encounter for screening mammogram for malignant neoplasm of breast: Secondary | ICD-10-CM

## 2023-05-14 DIAGNOSIS — Z122 Encounter for screening for malignant neoplasm of respiratory organs: Secondary | ICD-10-CM

## 2023-05-14 DIAGNOSIS — E213 Hyperparathyroidism, unspecified: Secondary | ICD-10-CM

## 2023-05-14 DIAGNOSIS — J449 Chronic obstructive pulmonary disease, unspecified: Secondary | ICD-10-CM | POA: Diagnosis not present

## 2023-05-14 DIAGNOSIS — R200 Anesthesia of skin: Secondary | ICD-10-CM | POA: Diagnosis not present

## 2023-05-14 DIAGNOSIS — M25512 Pain in left shoulder: Secondary | ICD-10-CM

## 2023-05-14 MED ORDER — DICLOFENAC SODIUM 1 % EX GEL: 2.0000 g | Freq: Four times a day (QID) | CUTANEOUS | 1 refills | Status: DC

## 2023-05-14 MED ORDER — FERROUS SULFATE 325 (65 FE) MG PO TABS: 325.0000 mg | ORAL_TABLET | Freq: Every day | ORAL | 1 refills | Status: AC

## 2023-05-14 MED ORDER — HYDRALAZINE HCL 10 MG PO TABS: 10.0000 mg | ORAL_TABLET | Freq: Three times a day (TID) | ORAL | 4 refills | Status: DC

## 2023-05-14 MED ORDER — PANTOPRAZOLE SODIUM 40 MG PO TBEC: 40.0000 mg | DELAYED_RELEASE_TABLET | Freq: Two times a day (BID) | ORAL | 4 refills | Status: DC

## 2023-05-14 MED ORDER — TRAMADOL HCL 50 MG PO TABS: 50.0000 mg | ORAL_TABLET | Freq: Three times a day (TID) | ORAL | 0 refills | Status: DC | PRN

## 2023-05-14 NOTE — Patient Instructions (Addendum)
Please stop at [facility] imaging at [address] to get x-rays of your left shoulder.  We will send prescription to [facility] for your nebulizer machine and for the cock up wrist splints for both hands.  You have an appointment with [facility] endocrinology on 06/03/2023 at 8:20 AM for evaluation of the high calcium and hyperthyroidism.  Please keep that appointment.  You are still anemic.  In preparation for your surgery, please make sure that you are taking your iron supplement 1 tablet daily.  I have referred you to the neurologist for the numbness in your hands.  I have sent a refill on the Voltaren gel and tramadol for you to use as needed.  The tramadol can cause some drowsiness.

## 2023-05-14 NOTE — Progress Notes (Signed)
Patient ID: Anna Mcguire, female    DOB: 1955/08/20  MRN: [id number]  CC: Hypertension (HTN f/u. Nicki Reaper fall on 05/06/23 - fell while getting out of bed, pain on L shoulder Dallie Piles received flu vax)   Subjective: Anna Mcguire is a 67 y.o. female who presents for chronic ds management. Her concerns today include:  patient with history of HTN, HL, tob dep, COPD, OA left knee, EtOH abuse, chronic diarrhea, SIADH, ACD with macrocytosis, CKD 3, Hypercalcemia likely due to primary hyperparathyroid   Since last visit with me, patient has had colonoscopy and EGD as part of her workup for weight loss and anemia.  Colonoscopy revealed nonbleeding hemorrhoids.  EGD showed some erythema and granularity on the greater curvature of the stomach.  H. pylori was negative. -She reports she has ran out of iron supplement about 2 weeks ago.  She purchased over-the-counter.  Hb has been range b/w 9-10.4. -Should be on pantoprazole 40 mg twice a day.  She reports that she is taking consistently.  She has gained a few pounds since she last saw me.  Reports that she is eating normally.  Still waiting for her to have mammogram and CT of the chest for lung cancer screening.  Reports having another fall about a week ago and landed on her left shoulder.  Having a lot of pain in the left shoulder. She ambulates with a rollator walker which she has with her today.  At the time of the fall, she states she did not have her rollator walker in her bedroom and was trying to hold onto the walls. -We are trying to get her worked up and send in approval for her orthopedics to move forward with left total knee replacement to be done with spinal anesthesia. -Request refill on Voltaren gel.  In regards to her COPD, she reports no recent flares.  She is using Spiriva and Dulera as prescribed.  Uses albuterol inhaler only occasionally.  Reports that she needs a new nebulizer machine as her current one gave out several  weeks ago.  Still complains of numbness in both hands.  Vitamin B12 and folic acid levels were normal.  HTN: Reports compliance with a Avapro 75 mg daily, amlodipine 10 mg daily and hydralazine 10 mg 3 times a day.  However I do not see the hydralazine on her medication list.  It may have fallen off.  However she confirms that she still has hydralazine at home and is taking it consistently.  Tobacco dependence, she continues to smoke but states she has cut back to 3 cigarettes a day.  Trying to quit.  In regards to alcohol use, she states she has also cut back significantly.  Drinks 1 cup of beer about every 2 to 3 days.  Hypercalcemia/elevated PTH: I have referred her to [facility] endocrinology for further evaluation and management.  She has the appointment coming up on the 27th of this month at 8:20 AM.  I printed this information and gave it to her and advised her to keep the appointment.   HM: She did not get her mammogram done.  States it was scheduled but she did not go because she had fallen that day.  She also has not had a CT scan of the chest for lung cancer screening. Patient Active Problem List   Diagnosis Date Noted   Normocytic anemia 04/08/2023   Loss of weight 04/08/2023   Vomiting without nausea 04/08/2023   Altered bowel habits 04/08/2023  Primary hyperparathyroidism (HCC) 03/26/2023   Arthritis of [facility]ometac[facility]al [facility]) joint of left thumb 04/23/2022   Pain in left shoulder 04/23/2022   Hypercalcemia 12/31/2021   Stage 3b chronic kidney disease (HCC) 12/31/2021   Allergic rhinitis 10/24/2020   Rotator cuff arthropathy of left shoulder 08/07/2020   Cervicalgia 08/07/2020   Chronic cough 03/21/2020   Alcohol use disorder, moderate, dependence (HCC) 01/30/2020   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   Tobacco dependence 01/30/2020   Diarrhea of infectious origin 01/30/2020   Hyponatremia 01/13/2020   Alcohol abuse 01/13/2020   Tobacco abuse  01/13/2020   Chronic diarrhea 01/13/2020   Hypophosphatemia 01/13/2020   Hypertension    Chronic obstructive pulmonary disease (HCC)    Alcohol use    Hypokalemia 01/12/2020   Tobacco use 10/06/2019   Unilateral primary osteoarthritis, left knee 09/08/2019   Pain in right shoulder 08/11/2019     Current Outpatient Medications on File Prior to Visit  Medication Sig Dispense Refill   albuterol (PROVENTIL) (2.5 MG/3ML) 0.083% nebulizer solution inhale THE contents of 1 vial PER nebulizer EVERY 6 HOURS AS NEEDED FOR wheezing OR SHORTNESS OF BREATH 150 mL 5   albuterol (VENTOLIN HFA) 108 (90 Base) MCG/ACT inhaler inhale 2 puffs BY MOUTH EVERY 6 HOURS AS NEEDED FOR wheezing OR shortness of breath 8.5 g 0   amLODipine (NORVASC) 10 MG tablet TAKE 1 Tablet BY MOUTH ONCE DAILY FOR BLOOD PRESSURE 90 tablet 1   atorvastatin (LIPITOR) 80 MG tablet TAKE 1 Tablet BY MOUTH ONCE DAILY 90 tablet 1   DULERA 100-5 MCG/ACT AERO INHALE 2 PUFFS BY MOUTH TWICE DAILY 13 g 6   irbesartan (AVAPRO) 75 MG tablet Take 1 tablet (75 mg total) by mouth daily. 90 tablet 1   sertraline (ZOLOFT) 25 MG tablet Take 1 tablet (25 mg total) by mouth daily. 30 tablet 2   tiotropium (SPIRIVA HANDIHALER) 18 MCG inhalation capsule PLACE 1 CAPSULE INTO INHALER AND INHALE DAILY 90 capsule 0   Vitamin D, Cholecalciferol, 10 MCG (400 UNIT) CAPS Take 400 Int'l Units by mouth daily. 100 capsule 1   [DISCONTINUED] fluticasone (FLONASE) 50 MCG/ACT nasal spray Place 1 spray into both nostrils daily.     [DISCONTINUED] lovastatin (MEVACOR) 20 MG tablet Take 20 mg by mouth daily at 6 PM.     No current facility-administered medications on file prior to visit.    Allergies  Allergen Reactions   Chantix [Varenicline Tartrate]     Diarrhea and dizziness   Penicillins Diarrhea    Social History   Socioeconomic History   Marital status: Single    Spouse name: Not on file   Number of children: Not on file   Years of education: Not on  file   Highest education level: Not on file  Occupational History   Not on file  Tobacco Use   Smoking status: Every Day    Current packs/day: 0.25    Types: Cigarettes   Smokeless tobacco: Never   Tobacco comments:    3 times a week - 10/24/2020    Buproprion helps  Vaping Use   Vaping status: Never Used  Substance and Sexual Activity   Alcohol use: Yes    Comment: has an alcoholic beverage 3 days/week   Drug use: Yes    Types: Marijuana    Comment: last used 04/27/23   Sexual activity: Not Currently  Other Topics Concern   Not on file  Social History Narrative   Retired  Social Determinants of Health   Financial Resource Strain: Low Risk  (05/14/2023)   Overall Financial Resource Strain (CARDIA)    Difficulty of Paying Living Expenses: Not hard at all  Food Insecurity: No Food Insecurity (05/14/2023)   Hunger Vital Sign    Worried About Running Out of Food in the Last Year: Never true    Ran Out of Food in the Last Year: Never true  Transportation Needs: No Transportation Needs (05/14/2023)   PRAPARE - Administrator, Civil Service (Medical): No    Lack of Transportation (Non-Medical): No  Physical Activity: Inactive (05/14/2023)   Exercise Vital Sign    Days of Exercise per Week: 0 days    Minutes of Exercise per Session: 0 min  Stress: No Stress Concern Present (05/14/2023)   [employer] of Occupational Health - Occupational Stress Questionnaire    Feeling of Stress : Not at all  Social Connections: Moderately Integrated (05/14/2023)   Social Connection and Isolation Panel [NHANES]    Frequency of Communication with Friends and Family: More than three times a week    Frequency of Social Gatherings with Friends and Family: More than three times a week    Attends Religious Services: More than 4 times per year    Active Member of Clubs or Organizations: Yes    Attends Banker Meetings: More than 4 times per year    Marital Status: Never  married  Intimate Partner Violence: Not At Risk (05/14/2023)   Humiliation, Afraid, Rape, and Kick questionnaire    Fear of Current or Ex-Partner: No    Emotionally Abused: No    Physically Abused: No    Sexually Abused: No    Family History  Problem Relation Age of Onset   Kidney failure Mother    Aneurysm Father     Past Surgical History:  Procedure Laterality Date   COLONOSCOPY WITH PROPOFOL N/A 06/07/2014   Procedure: COLONOSCOPY WITH PROPOFOL;  Surgeon: Willis Modena, MD;  Location: [facility];  Service: Endoscopy;  Laterality: N/A;   ESOPHAGOGASTRODUODENOSCOPY (EGD) WITH PROPOFOL N/A 06/07/2014   Procedure: ESOPHAGOGASTRODUODENOSCOPY (EGD) WITH PROPOFOL;  Surgeon: Willis Modena, MD;  Location: [facility];  Service: Endoscopy;  Laterality: N/A;    ROS: Review of Systems Negative except as stated above  PHYSICAL EXAM: BP 126/66   Pulse 70   Temp 98.2 F (36.8 C) (Oral)   Ht 5\' 6"  (1.676 m)   Wt 110 lb (49.9 kg)   SpO2 100%   BMI 17.75 kg/m   Wt Readings from Last 3 Encounters:  05/14/23 110 lb (49.9 kg)  05/04/23 112 lb (50.8 kg)  04/07/23 112 lb (50.8 kg)    Physical Exam  General appearance -elderly African-American female.  She appears malnourished and underweight for height. Mental status -patient is a bit forgetful but answers most questions appropriately. Mouth -oral mucosa is moist. Neck -supple.  No cervical lymphadenopathy or thyroid enlargement. Lymphatics -no axillary lymphadenopathy. Chest - clear to auscultation, no wheezes, rales or rhonchi, symmetric air entry Heart - normal rate, regular rhythm, normal S1, S2, no murmurs, rubs, clicks or gallops Musculoskeletal -she has her rollator walker with seat with her today.  Knees: She has enlargement of both knee joints.  Left shoulder: Mild edema of the soft tissues.  Moderate tenderness on palpation anteriorly.  Limited range of motion of the joint due to pain.  Mild enlargement of the PIP joints  of both hands.  Mild wasting of intrinsic muscles  of the hands. Extremities -no lower extremity edema.      Latest Ref Rng & Units 03/25/2023   10:37 AM 03/13/2023   10:55 AM 08/05/2022    9:43 AM  CMP  Glucose 70 - 99 mg/dL  841  86   BUN 8 - 27 mg/dL  20  19   Creatinine 3.24 - 1.00 mg/dL  4.01  0.27   Sodium 253 - 144 mmol/L  136  140   Potassium 3.5 - 5.2 mmol/L  4.5  4.4   Chloride 96 - 106 mmol/L  102  106   CO2 20 - 29 mmol/L  18  19   Calcium 8.7 - 10.3 mg/dL 66.4  40.3  47.4   Total Protein 6.0 - 8.5 g/dL  7.1    Total Bilirubin 0.0 - 1.2 mg/dL  0.3    Alkaline Phos 44 - 121 IU/L  116    AST 0 - 40 IU/L  25    ALT 0 - 32 IU/L  23     Lipid Panel     Component Value Date/Time   CHOL 155 03/20/2021 1454   TRIG 258 (H) 03/20/2021 1454   HDL 82 03/20/2021 1454   CHOLHDL 1.9 03/20/2021 1454   CHOLHDL 1.5 01/15/2020 0258   VLDL 23 01/15/2020 0258   LDLCALC 35 03/20/2021 1454    CBC    Component Value Date/Time   WBC 5.3 03/13/2023 1055   WBC 4.7 03/02/2022 1537   RBC 3.26 (L) 03/13/2023 1055   RBC 3.02 (L) 03/02/2022 1537   HGB 10.4 (L) 03/13/2023 1055   HCT 31.6 (L) 03/13/2023 1055   PLT 367 03/13/2023 1055   MCV 97 03/13/2023 1055   MCH 31.9 03/13/2023 1055   MCH 32.8 03/02/2022 1537   MCHC 32.9 03/13/2023 1055   MCHC 32.5 03/02/2022 1537   RDW 13.3 03/13/2023 1055   LYMPHSABS 1.4 03/02/2022 1537   MONOABS 0.6 03/02/2022 1537   EOSABS 0.2 03/02/2022 1537   BASOSABS 0.1 03/02/2022 1537    ASSESSMENT AND PLAN: 1. Primary osteoarthritis of left knee Will recheck CBC and chem today. If everything is stable, will move forward in releasing her to have surgery on her knee.  I think without the surgery, she will continue to have falls.  Would have preferred that her nutritional status was better. Given a limited RF on Tramadol.  Advise that med can cause drowsiness. NCCSRS reviewed - diclofenac Sodium (VOLTAREN) 1 % GEL; Apply 2 g topically 4 (four) times  daily.  Dispense: 100 g; Refill: 1 - traMADol (ULTRAM) 50 MG tablet; Take 1 tablet (50 mg total) by mouth every 8 (eight) hours as needed.  Dispense: 40 tablet; Refill: 0  2. Acute pain of left shoulder X-ray needed to eval for fracture - DG Shoulder Left; Future  3. Essential hypertension At goal.  Continue current meds listed above. - Comprehensive metabolic panel - hydrALAZINE (APRESOLINE) 10 MG tablet; Take 1 tablet (10 mg total) by mouth 3 (three) times daily.  Dispense: 90 tablet; Refill: 4  4. Chronic obstructive pulmonary disease, unspecified COPD type (HCC) Stable.  Continue Elwin Sleight and Barnetta Chapel - For home use only DME Nebulizer machine  5. Tobacco dependence Commended on cutting back.  Encouraged to quit completely  6. Chronic anemia - ferrous sulfate 325 (65 FE) MG tablet; Take 1 tablet (325 mg total) by mouth daily with breakfast. Patient to pay out of pocket if not covered by her insurance.  Dispense: 100  tablet; Refill: 1 - CBC  7. Hyperparathyroidism (HCC) 8. Hypercalcemia Advised to keep upcoming appt with [facility] Endocrinology  9. Screening for lung cancer I had my CMA call and scheduled appt for her with [place] for screening CT lung  10. Encounter for screening mammogram for malignant neoplasm of breast We called and scheduled MMG for her.  Pt given information  11. Numbness and tingling in both hands - Ambulatory referral to Neurology - For home use only DME Other see comment   Patient was given the opportunity to ask questions.  Patient verbalized understanding of the plan and was able to repeat key elements of the plan.   This documentation was completed using Paediatric nurse.  Any transcriptional errors are unintentional.  Orders Placed This Encounter  Procedures   For home use only DME Nebulizer machine   For home use only DME Other see comment   DG Shoulder Left   CBC   Comprehensive metabolic panel   Ambulatory referral to  Neurology     Requested Prescriptions   Signed Prescriptions Disp Refills   diclofenac Sodium (VOLTAREN) 1 % GEL 100 g 1    Sig: Apply 2 g topically 4 (four) times daily.   ferrous sulfate 325 (65 FE) MG tablet 100 tablet 1    Sig: Take 1 tablet (325 mg total) by mouth daily with breakfast. Patient to pay out of pocket if not covered by her insurance.   traMADol (ULTRAM) 50 MG tablet 40 tablet 0    Sig: Take 1 tablet (50 mg total) by mouth every 8 (eight) hours as needed.   pantoprazole (PROTONIX) 40 MG tablet 60 tablet 4    Sig: Take 1 tablet (40 mg total) by mouth 2 (two) times daily.   hydrALAZINE (APRESOLINE) 10 MG tablet 90 tablet 4    Sig: Take 1 tablet (10 mg total) by mouth 3 (three) times daily.    Return in about 3 months (around 08/14/2023).  Jonah Blue, MD, FACP

## 2023-05-15 ENCOUNTER — Ambulatory Visit
Admission: RE | Admit: 2023-05-15 | Discharge: 2023-05-15 | Disposition: A | Discharge status: Home / Self Care | Payer: 59 | Source: Ambulatory Visit | Attending: Internal Medicine | Admitting: Internal Medicine

## 2023-05-15 DIAGNOSIS — M25512 Pain in left shoulder: Secondary | ICD-10-CM | POA: Diagnosis not present

## 2023-05-19 ENCOUNTER — Telehealth: Payer: Self-pay | Admitting: Internal Medicine

## 2023-05-19 NOTE — Telephone Encounter (Signed)
Copied from CRM [id number]. Topic: General - Other >> May 19, 2023 12:48 PM Everette C wrote: Ethelene Browns with [employer] has called to request an updated prescription details for the patient's recently submitted paperwork to include Dr. Henriette Combs NPI and resubmitted to [employer] at  [phone] when possible   Please call [phone] if needed further

## 2023-05-20 NOTE — Telephone Encounter (Signed)
All requested information with provider NPI successfully faxed to [employer] on 05/20/2023.

## 2023-05-21 ENCOUNTER — Ambulatory Visit: Payer: 59 | Admitting: Dermatology

## 2023-05-22 ENCOUNTER — Other Ambulatory Visit: Payer: Self-pay

## 2023-05-26 ENCOUNTER — Ambulatory Visit (INDEPENDENT_AMBULATORY_CARE_PROVIDER_SITE_OTHER): Payer: 59 | Admitting: Podiatry

## 2023-05-26 DIAGNOSIS — Z91199 Patient's noncompliance with other medical treatment and regimen due to unspecified reason: Secondary | ICD-10-CM

## 2023-05-27 ENCOUNTER — Inpatient Hospital Stay: Admission: RE | Admit: 2023-05-27 | Payer: 59 | Source: Ambulatory Visit

## 2023-05-27 NOTE — Progress Notes (Signed)
1. No-show for appointment     

## 2023-05-28 ENCOUNTER — Other Ambulatory Visit: Payer: 59

## 2023-06-03 ENCOUNTER — Ambulatory Visit: Payer: 59 | Admitting: "Endocrinology

## 2023-06-09 NOTE — Progress Notes (Signed)
Sent message, via epic in basket, requesting orders in epic from surgeon.  

## 2023-06-10 NOTE — Progress Notes (Addendum)
Anesthesia Review:  PCP: Cardiologist : Chest x-ray : EKG : Echo : Stress test: Cardiac Cath :  Activity level:  Sleep Study/ CPAP : Fasting Blood Sugar :      / Checks Blood Sugar -- times a day:   Blood Thinner/ Instructions /Last Dose: ASA / Instructions/ Last Dose :  

## 2023-06-10 NOTE — Patient Instructions (Addendum)
SURGICAL WAITING ROOM VISITATION  Patients having surgery or a procedure may have no more than 2 support people in the waiting area - these visitors may rotate.    Children under the age of 93 must have an adult with them who is not the patient.  Due to an increase in RSV and influenza rates and associated hospitalizations, children ages 82 and under may not visit patients in [facility] hospitals.  If the patient needs to stay at the hospital during part of their recovery, the visitor guidelines for inpatient rooms apply. Pre-op nurse will coordinate an appropriate time for 1 support person to accompany patient in pre-op.  This support person may not rotate.    Please refer to the [facility] website for the visitor guidelines for Inpatients (after your surgery is over and you are in a regular room).       Your procedure is scheduled on:  06/24/2023    Report to [facility] Main Entrance    Report to admitting at   0830AM   Call this number if you have problems the morning of surgery [phone]   Do not eat food :After Midnight.   After Midnight you may have the following liquids until __ 0800____ AM DAY OF SURGERY  Water Non-Citrus Juices (without pulp, NO RED-Apple, White grape, White cranberry) Black Coffee (NO MILK/CREAM OR CREAMERS, sugar ok)  Clear Tea (NO MILK/CREAM OR CREAMERS, sugar ok) regular and decaf                             Plain Jell-O (NO RED)                                           Fruit ices (not with fruit pulp, NO RED)                                     Popsicles (NO RED)                                                               Sports drinks like Gatorade (NO RED)                   The day of surgery:  Drink ONE (1) Pre-Surgery Clear Ensure or G2 at  0800 AM ( have completed by )  the morning of surgery. Drink in one sitting. Do not sip.  This drink was given to you during your hospital  pre-op appointment visit. Nothing else to  drink after completing the  Pre-Surgery Clear Ensure or G2.          If you have questions, please contact your surgeon's office.      Oral Hygiene is also important to reduce your risk of infection.                                    Remember - BRUSH YOUR TEETH THE MORNING OF SURGERY WITH YOUR REGULAR  TOOTHPASTE  DENTURES WILL BE REMOVED PRIOR TO SURGERY PLEASE DO NOT APPLY "Poly grip" OR ADHESIVES!!!   Do NOT smoke after Midnight   Stop all vitamins and herbal supplements 7 days before surgery.   Take these medicines the morning of surgery with A SIP OF WATER:  nebulizer if needed, inhalers as usual and bring, amlodipine, hydralazine , protonix, zoloft   DO NOT TAKE ANY ORAL DIABETIC MEDICATIONS DAY OF YOUR SURGERY  Bring CPAP mask and tubing day of surgery.                              You may not have any metal on your body including hair pins, jewelry, and body piercing             Do not wear make-up, lotions, powders, perfumes/cologne, or deodorant  Do not wear nail polish including gel and S&S, artificial/acrylic nails, or any other type of covering on natural nails including finger and toenails. If you have artificial nails, gel coating, etc. that needs to be removed by a nail salon please have this removed prior to surgery or surgery may need to be canceled/ delayed if the surgeon/ anesthesia feels like they are unable to be safely monitored.   Do not shave  48 hours prior to surgery.               Men may shave face and neck.   Do not bring valuables to the hospital. [facility] IS NOT             RESPONSIBLE   FOR VALUABLES.   Contacts, glasses, dentures or bridgework may not be worn into surgery.   Bring small overnight bag day of surgery.   DO NOT BRING YOUR HOME MEDICATIONS TO THE HOSPITAL. PHARMACY WILL DISPENSE MEDICATIONS LISTED ON YOUR MEDICATION LIST TO YOU DURING YOUR ADMISSION IN THE HOSPITAL!    Patients discharged on the day of surgery will not be  allowed to drive home.  Someone NEEDS to stay with you for the first 24 hours after anesthesia.   Special Instructions: Bring a copy of your healthcare power of attorney and living will documents the day of surgery if you haven't scanned them before.              Please read over the following fact sheets you were given: IF YOU HAVE QUESTIONS ABOUT YOUR PRE-OP INSTRUCTIONS PLEASE CALL [phone]   If you received a COVID test during your pre-op visit  it is requested that you wear a mask when out in public, stay away from anyone that may not be feeling well and notify your surgeon if you develop symptoms. If you test positive for Covid or have been in contact with anyone that has tested positive in the last 10 days please notify you surgeon.      Pre-operative 5 CHG Bath Instructions   You can play a key role in reducing the risk of infection after surgery. Your skin needs to be as free of germs as possible. You can reduce the number of germs on your skin by washing with CHG (chlorhexidine gluconate) soap before surgery. CHG is an antiseptic soap that kills germs and continues to kill germs even after washing.   DO NOT use if you have an allergy to chlorhexidine/CHG or antibacterial soaps. If your skin becomes reddened or irritated, stop using the CHG and notify one of our RNs at  [phone].   Please shower with the CHG soap starting 4 days before surgery using the following schedule:     Please keep in mind the following:  DO NOT shave, including legs and underarms, starting the day of your first shower.   You may shave your face at any point before/day of surgery.  Place clean sheets on your bed the day you start using CHG soap. Use a clean washcloth (not used since being washed) for each shower. DO NOT sleep with pets once you start using the CHG.   CHG Shower Instructions:  If you choose to wash your hair and private area, wash first with your normal shampoo/soap.  After you use  shampoo/soap, rinse your hair and body thoroughly to remove shampoo/soap residue.  Turn the water OFF and apply about 3 tablespoons (45 ml) of CHG soap to a CLEAN washcloth.  Apply CHG soap ONLY FROM YOUR NECK DOWN TO YOUR TOES (washing for 3-5 minutes)  DO NOT use CHG soap on face, private areas, open wounds, or sores.  Pay special attention to the area where your surgery is being performed.  If you are having back surgery, having someone wash your back for you may be helpful. Wait 2 minutes after CHG soap is applied, then you may rinse off the CHG soap.  Pat dry with a clean towel  Put on clean clothes/pajamas   If you choose to wear lotion, please use ONLY the CHG-compatible lotions on the back of this paper.     Additional instructions for the day of surgery: DO NOT APPLY any lotions, deodorants, cologne, or perfumes.   Put on clean/comfortable clothes.  Brush your teeth.  Ask your nurse before applying any prescription medications to the skin.      CHG Compatible Lotions   Aveeno Moisturizing lotion  Cetaphil Moisturizing Cream  Cetaphil Moisturizing Lotion  Clairol Herbal Essence Moisturizing Lotion, Dry Skin  Clairol Herbal Essence Moisturizing Lotion, Extra Dry Skin  Clairol Herbal Essence Moisturizing Lotion, Normal Skin  Curel Age Defying Therapeutic Moisturizing Lotion with Alpha Hydroxy  Curel Extreme Care Body Lotion  Curel Soothing Hands Moisturizing Hand Lotion  Curel Therapeutic Moisturizing Cream, Fragrance-Free  Curel Therapeutic Moisturizing Lotion, Fragrance-Free  Curel Therapeutic Moisturizing Lotion, Original Formula  Eucerin Daily Replenishing Lotion  Eucerin Dry Skin Therapy Plus Alpha Hydroxy Crme  Eucerin Dry Skin Therapy Plus Alpha Hydroxy Lotion  Eucerin Original Crme  Eucerin Original Lotion  Eucerin Plus Crme Eucerin Plus Lotion  Eucerin TriLipid Replenishing Lotion  Keri Anti-Bacterial Hand Lotion  Keri Deep Conditioning Original Lotion Dry  Skin Formula Softly Scented  Keri Deep Conditioning Original Lotion, Fragrance Free Sensitive Skin Formula  Keri Lotion Fast Absorbing Fragrance Free Sensitive Skin Formula  Keri Lotion Fast Absorbing Softly Scented Dry Skin Formula  Keri Original Lotion  Keri Skin Renewal Lotion Keri Silky Smooth Lotion  Keri Silky Smooth Sensitive Skin Lotion  Nivea Body Creamy Conditioning Oil  Nivea Body Extra Enriched Teacher, adult education Moisturizing Lotion Nivea Crme  Nivea Skin Firming Lotion  NutraDerm 30 Skin Lotion  NutraDerm Skin Lotion  NutraDerm Therapeutic Skin Cream  NutraDerm Therapeutic Skin Lotion  ProShield Protective Hand Cream  Provon moisturizing lotion

## 2023-06-11 ENCOUNTER — Ambulatory Visit: Payer: 59

## 2023-06-11 DIAGNOSIS — M25562 Pain in left knee: Secondary | ICD-10-CM | POA: Diagnosis not present

## 2023-06-12 NOTE — Progress Notes (Signed)
Second request for pre op orders in [facility]: Left voicemail with [employer].

## 2023-06-15 ENCOUNTER — Ambulatory Visit: Payer: Self-pay | Admitting: Emergency Medicine

## 2023-06-15 DIAGNOSIS — G8929 Other chronic pain: Secondary | ICD-10-CM

## 2023-06-15 NOTE — H&P (Signed)
TOTAL KNEE ADMISSION H&P  Patient is being admitted for left total knee arthroplasty.  Subjective:  Chief Complaint:left knee pain.  HPI: Anna Mcguire, 67 y.o. female, has a history of pain and functional disability in the left knee due to arthritis and has failed non-surgical conservative treatments for greater than 12 weeks to includeNSAID's and/or analgesics, use of assistive devices, and activity modification.  Onset of symptoms was gradual, starting 5 years ago with gradually worsening course since that time. The patient noted no past surgery on the left knee(s).  Patient currently rates pain in the left knee(s) at 8 out of 10 with activity. Patient has night pain, worsening of pain with activity and weight bearing, pain that interferes with activities of daily living, and pain with passive range of motion.  Patient has evidence of periarticular osteophytes and joint space narrowing by imaging studies.  There is no active infection.  Patient Active Problem List   Diagnosis Date Noted   Normocytic anemia 04/08/2023   Loss of weight 04/08/2023   Vomiting without nausea 04/08/2023   Altered bowel habits 04/08/2023   Primary hyperparathyroidism (HCC) 03/26/2023   Arthritis of carpometacarpal (CMC) joint of left thumb 04/23/2022   Pain in left shoulder 04/23/2022   Hypercalcemia 12/31/2021   Stage 3b chronic kidney disease (HCC) 12/31/2021   Allergic rhinitis 10/24/2020   Rotator cuff arthropathy of left shoulder 08/07/2020   Cervicalgia 08/07/2020   Chronic cough 03/21/2020   Alcohol use disorder, moderate, dependence (HCC) 01/30/2020   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   Tobacco dependence 01/30/2020   Diarrhea of infectious origin 01/30/2020   Hyponatremia 01/13/2020   Alcohol abuse 01/13/2020   Tobacco abuse 01/13/2020   Chronic diarrhea 01/13/2020   Hypophosphatemia 01/13/2020   Hypertension    Chronic obstructive pulmonary disease  (HCC)    Alcohol use    Hypokalemia 01/12/2020   Tobacco use 10/06/2019   Unilateral primary osteoarthritis, left knee 09/08/2019   Pain in right shoulder 08/11/2019   Past Medical History:  Diagnosis Date   Acid reflux    Arthritis    Hypertension     Past Surgical History:  Procedure Laterality Date   COLONOSCOPY WITH PROPOFOL N/A 06/07/2014   Procedure: COLONOSCOPY WITH PROPOFOL;  Surgeon: Willis Modena, MD;  Location: [facility];  Service: Endoscopy;  Laterality: N/A;   ESOPHAGOGASTRODUODENOSCOPY (EGD) WITH PROPOFOL N/A 06/07/2014   Procedure: ESOPHAGOGASTRODUODENOSCOPY (EGD) WITH PROPOFOL;  Surgeon: Willis Modena, MD;  Location: [facility];  Service: Endoscopy;  Laterality: N/A;    Current Outpatient Medications  Medication Sig Dispense Refill Last Dose   albuterol (PROVENTIL) (2.5 MG/3ML) 0.083% nebulizer solution inhale THE contents of 1 vial PER nebulizer EVERY 6 HOURS AS NEEDED FOR wheezing OR SHORTNESS OF BREATH 150 mL 5    albuterol (VENTOLIN HFA) 108 (90 Base) MCG/ACT inhaler inhale 2 puffs BY MOUTH EVERY 6 HOURS AS NEEDED FOR wheezing OR shortness of breath 8.5 g 0    amLODipine (NORVASC) 10 MG tablet TAKE 1 Tablet BY MOUTH ONCE DAILY FOR BLOOD PRESSURE 90 tablet 1    atorvastatin (LIPITOR) 80 MG tablet TAKE 1 Tablet BY MOUTH ONCE DAILY 90 tablet 1    diclofenac Sodium (VOLTAREN) 1 % GEL Apply 2 g topically 4 (four) times daily. 100 g 1    DULERA 100-5 MCG/ACT AERO INHALE 2 PUFFS BY MOUTH TWICE DAILY 13 g 6    ferrous sulfate 325 (65 FE) MG tablet Take 1 tablet (325 mg total)  by mouth daily with breakfast. Patient to pay out of pocket if not covered by her insurance. 100 tablet 1    hydrALAZINE (APRESOLINE) 10 MG tablet Take 1 tablet (10 mg total) by mouth 3 (three) times daily. 90 tablet 4    irbesartan (AVAPRO) 75 MG tablet Take 1 tablet (75 mg total) by mouth daily. 90 tablet 1    pantoprazole (PROTONIX) 40 MG tablet Take 1 tablet (40 mg total) by mouth 2 (two)  times daily. 60 tablet 4    sertraline (ZOLOFT) 25 MG tablet Take 1 tablet (25 mg total) by mouth daily. 30 tablet 2    tiotropium (SPIRIVA HANDIHALER) 18 MCG inhalation capsule PLACE 1 CAPSULE INTO INHALER AND INHALE DAILY 90 capsule 0    traMADol (ULTRAM) 50 MG tablet Take 1 tablet (50 mg total) by mouth every 8 (eight) hours as needed. 40 tablet 0    Vitamin D, Cholecalciferol, 10 MCG (400 UNIT) CAPS Take 400 Int'l Units by mouth daily. 100 capsule 1    No current facility-administered medications for this visit.   Allergies  Allergen Reactions   Chantix [Varenicline Tartrate]     Diarrhea and dizziness   Penicillins Diarrhea    Social History   Tobacco Use   Smoking status: Every Day    Current packs/day: 0.25    Types: Cigarettes   Smokeless tobacco: Never   Tobacco comments:    3 times a week - 10/24/2020    Buproprion helps  Substance Use Topics   Alcohol use: Yes    Comment: has an alcoholic beverage 3 days/week    Family History  Problem Relation Age of Onset   Kidney failure Mother    Aneurysm Father      Review of Systems  Musculoskeletal:  Positive for arthralgias.  All other systems reviewed and are negative.   Objective:  Physical Exam Constitutional:      General: She is not in acute distress.    Appearance: Normal appearance. She is not ill-appearing.  HENT:     Head: Normocephalic and atraumatic.     Right Ear: External ear normal.     Left Ear: External ear normal.     Nose: Nose normal.     Mouth/Throat:     Mouth: Mucous membranes are moist.     Pharynx: Oropharynx is clear.  Eyes:     Extraocular Movements: Extraocular movements intact.     Conjunctiva/sclera: Conjunctivae normal.  Cardiovascular:     Rate and Rhythm: Normal rate and regular rhythm.     Pulses: Normal pulses.     Heart sounds: Normal heart sounds.  Pulmonary:     Effort: Pulmonary effort is normal.     Breath sounds: Normal breath sounds.  Abdominal:     General:  Bowel sounds are normal.     Palpations: Abdomen is soft.     Tenderness: There is no abdominal tenderness.  Musculoskeletal:        General: Tenderness present.     Cervical back: Normal range of motion and neck supple.     Comments: TTP over medial and lateral joint lines.  No calf tenderness, swelling, or erythema.  No overlying lesions of area of chief complaint.  Decreased strength and ROM due to elicited pain.  Pre-operative ROM 10-100.  Dorsiflexion and plantarflexion intact.  Stable to varus and valgus stress.  BLE appear grossly neurovascularly intact.  Gait mildly antalgic.   Skin:    General: Skin is warm and  dry.  Neurological:     Mental Status: She is alert and oriented to person, place, and time. Mental status is at baseline.  Psychiatric:        Mood and Affect: Mood normal.        Behavior: Behavior normal.     Vital signs in last 24 hours: @VSRANGES @  Labs:   Estimated body mass index is 17.75 kg/m as calculated from the following:   Height as of 05/14/23: 5\' 6"  (1.676 m).   Weight as of 05/14/23: 49.9 kg.   Imaging Review Plain radiographs demonstrate severe degenerative joint disease of the left knee(s). The overall alignment issignificant valgus. The bone quality appears to be fair for age and reported activity level.     Assessment/Plan:  End stage arthritis, left knee   The patient history, physical examination, clinical judgment of the provider and imaging studies are consistent with end stage degenerative joint disease of the left knee(s) and total knee arthroplasty is deemed medically necessary. The treatment options including medical management, injection therapy arthroscopy and arthroplasty were discussed at length. The risks and benefits of total knee arthroplasty were presented and reviewed. The risks due to aseptic loosening, infection, stiffness, patella tracking problems, thromboembolic complications and other imponderables were discussed. The  patient acknowledged the explanation, agreed to proceed with the plan and consent was signed. Patient is being admitted for inpatient treatment for surgery, pain control, PT, OT, prophylactic antibiotics, VTE prophylaxis, progressive ambulation and ADL's and discharge planning. The patient is planning to be discharged home with outpatient PT.    Anticipated LOS equal to or greater than 2 midnights due to - Age 43 and older with one or more of the following:  - Obesity  - Expected need for hospital services (PT, OT, Nursing) required for safe  discharge  - Anticipated need for postoperative skilled nursing care or inpatient rehab  - Active co-morbidities: SIADH, ETOH abuse, anemia of chronic disease, CKD stage 3b, hyperparathyroidism, chronic diarrhea, HTN, HLD, GERD, COPD  OR   - Unanticipated findings during/Post Surgery: None  - Patient is a high risk of re-admission due to: None

## 2023-06-15 NOTE — H&P (View-Only) (Signed)
 TOTAL KNEE ADMISSION H&P  Patient is being admitted for left total knee arthroplasty.  Subjective:  Chief Complaint:left knee pain.  HPI: Anna Mcguire, 67 y.o. female, has a history of pain and functional disability in the left knee due to arthritis and has failed non-surgical conservative treatments for greater than 12 weeks to includeNSAID's and/or analgesics, use of assistive devices, and activity modification.  Onset of symptoms was gradual, starting 5 years ago with gradually worsening course since that time. The patient noted no past surgery on the left knee(s).  Patient currently rates pain in the left knee(s) at 8 out of 10 with activity. Patient has night pain, worsening of pain with activity and weight bearing, pain that interferes with activities of daily living, and pain with passive range of motion.  Patient has evidence of periarticular osteophytes and joint space narrowing by imaging studies.  There is no active infection.  Patient Active Problem List   Diagnosis Date Noted   Normocytic anemia 04/08/2023   Loss of weight 04/08/2023   Vomiting without nausea 04/08/2023   Altered bowel habits 04/08/2023   Primary hyperparathyroidism (HCC) 03/26/2023   Arthritis of carpometacarpal (CMC) joint of left thumb 04/23/2022   Pain in left shoulder 04/23/2022   Hypercalcemia 12/31/2021   Stage 3b chronic kidney disease (HCC) 12/31/2021   Allergic rhinitis 10/24/2020   Rotator cuff arthropathy of left shoulder 08/07/2020   Cervicalgia 08/07/2020   Chronic cough 03/21/2020   Alcohol use disorder, moderate, dependence (HCC) 01/30/2020   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   Tobacco dependence 01/30/2020   Diarrhea of infectious origin 01/30/2020   Hyponatremia 01/13/2020   Alcohol abuse 01/13/2020   Tobacco abuse 01/13/2020   Chronic diarrhea 01/13/2020   Hypophosphatemia 01/13/2020   Hypertension    Chronic obstructive pulmonary disease  (HCC)    Alcohol use    Hypokalemia 01/12/2020   Tobacco use 10/06/2019   Unilateral primary osteoarthritis, left knee 09/08/2019   Pain in right shoulder 08/11/2019   Past Medical History:  Diagnosis Date   Acid reflux    Arthritis    Hypertension     Past Surgical History:  Procedure Laterality Date   COLONOSCOPY WITH PROPOFOL N/A 06/07/2014   Procedure: COLONOSCOPY WITH PROPOFOL;  Surgeon: Willis Modena, MD;  Location: [facility];  Service: Endoscopy;  Laterality: N/A;   ESOPHAGOGASTRODUODENOSCOPY (EGD) WITH PROPOFOL N/A 06/07/2014   Procedure: ESOPHAGOGASTRODUODENOSCOPY (EGD) WITH PROPOFOL;  Surgeon: Willis Modena, MD;  Location: [facility];  Service: Endoscopy;  Laterality: N/A;    Current Outpatient Medications  Medication Sig Dispense Refill Last Dose   albuterol (PROVENTIL) (2.5 MG/3ML) 0.083% nebulizer solution inhale THE contents of 1 vial PER nebulizer EVERY 6 HOURS AS NEEDED FOR wheezing OR SHORTNESS OF BREATH 150 mL 5    albuterol (VENTOLIN HFA) 108 (90 Base) MCG/ACT inhaler inhale 2 puffs BY MOUTH EVERY 6 HOURS AS NEEDED FOR wheezing OR shortness of breath 8.5 g 0    amLODipine (NORVASC) 10 MG tablet TAKE 1 Tablet BY MOUTH ONCE DAILY FOR BLOOD PRESSURE 90 tablet 1    atorvastatin (LIPITOR) 80 MG tablet TAKE 1 Tablet BY MOUTH ONCE DAILY 90 tablet 1    diclofenac Sodium (VOLTAREN) 1 % GEL Apply 2 g topically 4 (four) times daily. 100 g 1    DULERA 100-5 MCG/ACT AERO INHALE 2 PUFFS BY MOUTH TWICE DAILY 13 g 6    ferrous sulfate 325 (65 FE) MG tablet Take 1 tablet (325 mg total)  by mouth daily with breakfast. Patient to pay out of pocket if not covered by her insurance. 100 tablet 1    hydrALAZINE (APRESOLINE) 10 MG tablet Take 1 tablet (10 mg total) by mouth 3 (three) times daily. 90 tablet 4    irbesartan (AVAPRO) 75 MG tablet Take 1 tablet (75 mg total) by mouth daily. 90 tablet 1    pantoprazole (PROTONIX) 40 MG tablet Take 1 tablet (40 mg total) by mouth 2 (two)  times daily. 60 tablet 4    sertraline (ZOLOFT) 25 MG tablet Take 1 tablet (25 mg total) by mouth daily. 30 tablet 2    tiotropium (SPIRIVA HANDIHALER) 18 MCG inhalation capsule PLACE 1 CAPSULE INTO INHALER AND INHALE DAILY 90 capsule 0    traMADol (ULTRAM) 50 MG tablet Take 1 tablet (50 mg total) by mouth every 8 (eight) hours as needed. 40 tablet 0    Vitamin D, Cholecalciferol, 10 MCG (400 UNIT) CAPS Take 400 Int'l Units by mouth daily. 100 capsule 1    No current facility-administered medications for this visit.   Allergies  Allergen Reactions   Chantix [Varenicline Tartrate]     Diarrhea and dizziness   Penicillins Diarrhea    Social History   Tobacco Use   Smoking status: Every Day    Current packs/day: 0.25    Types: Cigarettes   Smokeless tobacco: Never   Tobacco comments:    3 times a week - 10/24/2020    Buproprion helps  Substance Use Topics   Alcohol use: Yes    Comment: has an alcoholic beverage 3 days/week    Family History  Problem Relation Age of Onset   Kidney failure Mother    Aneurysm Father      Review of Systems  Musculoskeletal:  Positive for arthralgias.  All other systems reviewed and are negative.   Objective:  Physical Exam Constitutional:      General: She is not in acute distress.    Appearance: Normal appearance. She is not ill-appearing.  HENT:     Head: Normocephalic and atraumatic.     Right Ear: External ear normal.     Left Ear: External ear normal.     Nose: Nose normal.     Mouth/Throat:     Mouth: Mucous membranes are moist.     Pharynx: Oropharynx is clear.  Eyes:     Extraocular Movements: Extraocular movements intact.     Conjunctiva/sclera: Conjunctivae normal.  Cardiovascular:     Rate and Rhythm: Normal rate and regular rhythm.     Pulses: Normal pulses.     Heart sounds: Normal heart sounds.  Pulmonary:     Effort: Pulmonary effort is normal.     Breath sounds: Normal breath sounds.  Abdominal:     General:  Bowel sounds are normal.     Palpations: Abdomen is soft.     Tenderness: There is no abdominal tenderness.  Musculoskeletal:        General: Tenderness present.     Cervical back: Normal range of motion and neck supple.     Comments: TTP over medial and lateral joint lines.  No calf tenderness, swelling, or erythema.  No overlying lesions of area of chief complaint.  Decreased strength and ROM due to elicited pain.  Pre-operative ROM 10-100.  Dorsiflexion and plantarflexion intact.  Stable to varus and valgus stress.  BLE appear grossly neurovascularly intact.  Gait mildly antalgic.   Skin:    General: Skin is warm and  dry.  Neurological:     Mental Status: She is alert and oriented to person, place, and time. Mental status is at baseline.  Psychiatric:        Mood and Affect: Mood normal.        Behavior: Behavior normal.     Vital signs in last 24 hours: @VSRANGES @  Labs:   Estimated body mass index is 17.75 kg/m as calculated from the following:   Height as of 05/14/23: 5\' 6"  (1.676 m).   Weight as of 05/14/23: 49.9 kg.   Imaging Review Plain radiographs demonstrate severe degenerative joint disease of the left knee(s). The overall alignment issignificant valgus. The bone quality appears to be fair for age and reported activity level.     Assessment/Plan:  End stage arthritis, left knee   The patient history, physical examination, clinical judgment of the provider and imaging studies are consistent with end stage degenerative joint disease of the left knee(s) and total knee arthroplasty is deemed medically necessary. The treatment options including medical management, injection therapy arthroscopy and arthroplasty were discussed at length. The risks and benefits of total knee arthroplasty were presented and reviewed. The risks due to aseptic loosening, infection, stiffness, patella tracking problems, thromboembolic complications and other imponderables were discussed. The  patient acknowledged the explanation, agreed to proceed with the plan and consent was signed. Patient is being admitted for inpatient treatment for surgery, pain control, PT, OT, prophylactic antibiotics, VTE prophylaxis, progressive ambulation and ADL's and discharge planning. The patient is planning to be discharged home with outpatient PT.    Anticipated LOS equal to or greater than 2 midnights due to - Age 43 and older with one or more of the following:  - Obesity  - Expected need for hospital services (PT, OT, Nursing) required for safe  discharge  - Anticipated need for postoperative skilled nursing care or inpatient rehab  - Active co-morbidities: SIADH, ETOH abuse, anemia of chronic disease, CKD stage 3b, hyperparathyroidism, chronic diarrhea, HTN, HLD, GERD, COPD  OR   - Unanticipated findings during/Post Surgery: None  - Patient is a high risk of re-admission due to: None

## 2023-06-16 ENCOUNTER — Encounter (HOSPITAL_COMMUNITY)
Admission: RE | Admit: 2023-06-16 | Discharge: 2023-06-16 | Disposition: A | Discharge status: Home / Self Care | Payer: 59 | Source: Ambulatory Visit | Attending: Internal Medicine | Admitting: Internal Medicine

## 2023-06-17 ENCOUNTER — Ambulatory Visit: Payer: Self-pay | Admitting: Emergency Medicine

## 2023-06-17 NOTE — Care Plan (Signed)
Ortho Bundle Case Management Note  Patient Details  Name: Anna Mcguire MRN: [id number] Date of Birth: Oct 31, 1955 Patient seen in the office for H&P. Will discharge to home with family to assist. Has rolling walker. [facility] Questions answered.  Patient and MD in agreement with plan. Choice offered.                     DME Arranged:    DME Agency:     HH Arranged:    HH Agency:     Additional Comments: Please contact me with any questions of if this plan should need to change.  Shauna Hugh,  RN,BSN,MHA,CCM  [facility]  [phone] 06/17/2023, 10:41 AM

## 2023-06-18 NOTE — Patient Instructions (Signed)
DUE TO COVID-19 ONLY TWO VISITORS  (aged 69 and older)  ARE ALLOWED TO COME WITH YOU AND STAY IN THE WAITING ROOM ONLY DURING PRE OP AND PROCEDURE.   **NO VISITORS ARE ALLOWED IN THE SHORT STAY AREA OR RECOVERY ROOM!!**  IF YOU WILL BE ADMITTED INTO THE HOSPITAL YOU ARE ALLOWED ONLY FOUR SUPPORT PEOPLE DURING VISITATION HOURS ONLY (7 AM -8PM)   The support person(s) must pass our screening, gel in and out, and wear a mask at all times, including in the patient's room. Patients must also wear a mask when staff or their support person are in the room. Visitors GUEST BADGE MUST BE WORN VISIBLY  One adult visitor may remain with you overnight and MUST be in the room by 8 P.M.     Your procedure is scheduled on: 06/24/23   Report to [facility] Main Entrance    Report to admitting at : 8:30 AM   Call this number if you have problems the morning of surgery [phone]   Do not eat food :After Midnight.   After Midnight you may have the following liquids until : 8:00 AM DAY OF SURGERY  Water Black Coffee (sugar ok, NO MILK/CREAM OR CREAMERS)  Tea (sugar ok, NO MILK/CREAM OR CREAMERS) regular and decaf                             Plain Jell-O (NO RED)                                           Fruit ices (not with fruit pulp, NO RED)                                     Popsicles (NO RED)                                                                  Juice: apple, WHITE grape, WHITE cranberry Sports drinks like Gatorade (NO RED)   The day of surgery:  Drink ONE (1) Pre-Surgery Clear Ensure at : 8:00 AM the morning of surgery. Drink in one sitting. Do not sip.  This drink was given to you during your hospital  pre-op appointment visit. Nothing else to drink after completing the  Pre-Surgery Clear Ensure or G2.          If you have questions, please contact your surgeon's office.  FOLLOW ANY ADDITIONAL PRE OP INSTRUCTIONS YOU RECEIVED FROM YOUR SURGEON'S OFFICE!!!   Oral  Hygiene is also important to reduce your risk of infection.                                    Remember - BRUSH YOUR TEETH THE MORNING OF SURGERY WITH YOUR REGULAR TOOTHPASTE  DENTURES WILL BE REMOVED PRIOR TO SURGERY PLEASE DO NOT APPLY "Poly grip" OR ADHESIVES!!!   Do NOT smoke after Midnight   Take these medicines the morning of surgery  with A SIP OF WATER: sertraline,hydralazine,amlodipine,pantoprazole.Use inhalers as usual,bring your rescue inhaler.  Bring CPAP mask and tubing day of surgery.                              You may not have any metal on your body including hair pins, jewelry, and body piercing             Do not wear make-up, lotions, powders, perfumes/cologne, or deodorant  Do not wear nail polish including gel and S&S, artificial/acrylic nails, or any other type of covering on natural nails including finger and toenails. If you have artificial nails, gel coating, etc. that needs to be removed by a nail salon please have this removed prior to surgery or surgery may need to be canceled/ delayed if the surgeon/ anesthesia feels like they are unable to be safely monitored.   Do not shave  48 hours prior to surgery.    Do not bring valuables to the hospital. [facility] IS NOT             RESPONSIBLE   FOR VALUABLES.   Contacts, glasses, or bridgework may not be worn into surgery.   Bring small overnight bag day of surgery.   DO NOT BRING YOUR HOME MEDICATIONS TO THE HOSPITAL. PHARMACY WILL DISPENSE MEDICATIONS LISTED ON YOUR MEDICATION LIST TO YOU DURING YOUR ADMISSION IN THE HOSPITAL!    Patients discharged on the day of surgery will not be allowed to drive home.  Someone NEEDS to stay with you for the first 24 hours after anesthesia.   Special Instructions: Bring a copy of your healthcare power of attorney and living will documents         the day of surgery if you haven't scanned them before.              Please read over the following fact sheets you were given:  IF YOU HAVE QUESTIONS ABOUT YOUR PRE-OP INSTRUCTIONS PLEASE CALL [phone]      Pre-operative 5 CHG Bath Instructions   You can play a key role in reducing the risk of infection after surgery. Your skin needs to be as free of germs as possible. You can reduce the number of germs on your skin by washing with CHG (chlorhexidine gluconate) soap before surgery. CHG is an antiseptic soap that kills germs and continues to kill germs even after washing.   DO NOT use if you have an allergy to chlorhexidine/CHG or antibacterial soaps. If your skin becomes reddened or irritated, stop using the CHG and notify one of our RNs at : [phone].   Please shower with the CHG soap starting 4 days before surgery using the following schedule:     Please keep in mind the following:  DO NOT shave, including legs and underarms, starting the day of your first shower.   You may shave your face at any point before/day of surgery.  Place clean sheets on your bed the day you start using CHG soap. Use a clean washcloth (not used since being washed) for each shower. DO NOT sleep with pets once you start using the CHG.   CHG Shower Instructions:  If you choose to wash your hair and private area, wash first with your normal shampoo/soap.  After you use shampoo/soap, rinse your hair and body thoroughly to remove shampoo/soap residue.  Turn the water OFF and apply about 3 tablespoons (45 ml) of  CHG soap to a CLEAN washcloth.  Apply CHG soap ONLY FROM YOUR NECK DOWN TO YOUR TOES (washing for 3-5 minutes)  DO NOT use CHG soap on face, private areas, open wounds, or sores.  Pay special attention to the area where your surgery is being performed.  If you are having back surgery, having someone wash your back for you may be helpful. Wait 2 minutes after CHG soap is applied, then you may rinse off the CHG soap.  Pat dry with a clean towel  Put on clean clothes/pajamas   If you choose to wear lotion, please use ONLY the  CHG-compatible lotions on the back of this paper.     Additional instructions for the day of surgery: DO NOT APPLY any lotions, deodorants, cologne, or perfumes.   Put on clean/comfortable clothes.  Brush your teeth.  Ask your nurse before applying any prescription medications to the skin.   CHG Compatible Lotions   Aveeno Moisturizing lotion  Cetaphil Moisturizing Cream  Cetaphil Moisturizing Lotion  Clairol Herbal Essence Moisturizing Lotion, Dry Skin  Clairol Herbal Essence Moisturizing Lotion, Extra Dry Skin  Clairol Herbal Essence Moisturizing Lotion, Normal Skin  Curel Age Defying Therapeutic Moisturizing Lotion with Alpha Hydroxy  Curel Extreme Care Body Lotion  Curel Soothing Hands Moisturizing Hand Lotion  Curel Therapeutic Moisturizing Cream, Fragrance-Free  Curel Therapeutic Moisturizing Lotion, Fragrance-Free  Curel Therapeutic Moisturizing Lotion, Original Formula  Eucerin Daily Replenishing Lotion  Eucerin Dry Skin Therapy Plus Alpha Hydroxy Crme  Eucerin Dry Skin Therapy Plus Alpha Hydroxy Lotion  Eucerin Original Crme  Eucerin Original Lotion  Eucerin Plus Crme Eucerin Plus Lotion  Eucerin TriLipid Replenishing Lotion  Keri Anti-Bacterial Hand Lotion  Keri Deep Conditioning Original Lotion Dry Skin Formula Softly Scented  Keri Deep Conditioning Original Lotion, Fragrance Free Sensitive Skin Formula  Keri Lotion Fast Absorbing Fragrance Free Sensitive Skin Formula  Keri Lotion Fast Absorbing Softly Scented Dry Skin Formula  Keri Original Lotion  Keri Skin Renewal Lotion Keri Silky Smooth Lotion  Keri Silky Smooth Sensitive Skin Lotion  Nivea Body Creamy Conditioning Oil  Nivea Body Extra Enriched Lotion  Nivea Body Original Lotion  Nivea Body Sheer Moisturizing Lotion Nivea Crme  Nivea Skin Firming Lotion  NutraDerm 30 Skin Lotion  NutraDerm Skin Lotion  NutraDerm Therapeutic Skin Cream  NutraDerm Therapeutic Skin Lotion  ProShield Protective Hand  Cream  Provon moisturizing lotion   Incentive Spirometer  An incentive spirometer is a tool that can help keep your lungs clear and active. This tool measures how well you are filling your lungs with each breath. Taking long deep breaths may help reverse or decrease the chance of developing breathing (pulmonary) problems (especially infection) following: A long period of time when you are unable to move or be active. BEFORE THE PROCEDURE  If the spirometer includes an indicator to show your best effort, your nurse or respiratory therapist will set it to a desired goal. If possible, sit up straight or lean slightly forward. Try not to slouch. Hold the incentive spirometer in an upright position. INSTRUCTIONS FOR USE  Sit on the edge of your bed if possible, or sit up as far as you can in bed or on a chair. Hold the incentive spirometer in an upright position. Breathe out normally. Place the mouthpiece in your mouth and seal your lips tightly around it. Breathe in slowly and as deeply as possible, raising the piston or the ball toward the top of the column. Hold your breath for  3-5 seconds or for as long as possible. Allow the piston or ball to fall to the bottom of the column. Remove the mouthpiece from your mouth and breathe out normally. Rest for a few seconds and repeat Steps 1 through 7 at least 10 times every 1-2 hours when you are awake. Take your time and take a few normal breaths between deep breaths. The spirometer may include an indicator to show your best effort. Use the indicator as a goal to work toward during each repetition. After each set of 10 deep breaths, practice coughing to be sure your lungs are clear. If you have an incision (the cut made at the time of surgery), support your incision when coughing by placing a pillow or rolled up towels firmly against it. Once you are able to get out of bed, walk around indoors and cough well. You may stop using the incentive spirometer  when instructed by your caregiver.  RISKS AND COMPLICATIONS Take your time so you do not get dizzy or light-headed. If you are in pain, you may need to take or ask for pain medication before doing incentive spirometry. It is harder to take a deep breath if you are having pain. AFTER USE Rest and breathe slowly and easily. It can be helpful to keep track of a log of your progress. Your caregiver can provide you with a simple table to help with this. If you are using the spirometer at home, follow these instructions: SEEK MEDICAL CARE IF:  You are having difficultly using the spirometer. You have trouble using the spirometer as often as instructed. Your pain medication is not giving enough relief while using the spirometer. You develop fever of 100.5 F (38.1 C) or higher. SEEK IMMEDIATE MEDICAL CARE IF:  You cough up bloody sputum that had not been present before. You develop fever of 102 F (38.9 C) or greater. You develop worsening pain at or near the incision site. MAKE SURE YOU:  Understand these instructions. Will watch your condition. Will get help right away if you are not doing well or get worse. Document Released: 11/03/2006 Document Revised: 09/15/2011 Document Reviewed: 01/04/2007 [facility] Patient Information 2014 [place], [place].   ________________________________________________________________________

## 2023-06-19 ENCOUNTER — Ambulatory Visit: Payer: 59 | Admitting: Gastroenterology

## 2023-06-19 ENCOUNTER — Encounter (HOSPITAL_COMMUNITY)
Admission: RE | Admit: 2023-06-19 | Discharge: 2023-06-19 | Disposition: A | Discharge status: Home / Self Care | Payer: 59 | Source: Ambulatory Visit | Attending: Internal Medicine | Admitting: Internal Medicine

## 2023-06-19 DIAGNOSIS — I10 Essential (primary) hypertension: Secondary | ICD-10-CM

## 2023-06-19 NOTE — Progress Notes (Signed)
Pt. Did not showed up for PST appointment.Did not answer three phone calls,message was left in answer machine.

## 2023-06-22 ENCOUNTER — Telehealth: Payer: Self-pay

## 2023-06-22 NOTE — Patient Instructions (Addendum)
SURGICAL WAITING ROOM VISITATION Patients having surgery or a procedure may have no more than 2 support people in the waiting area - these visitors may rotate.    Children under the age of 83 must have an adult with them who is not the patient.  If the patient needs to stay at the hospital during part of their recovery, the visitor guidelines for inpatient rooms apply. Pre-op nurse will coordinate an appropriate time for 1 support person to accompany patient in pre-op.  This support person may not rotate.    Please refer to the [facility] website for the visitor guidelines for Inpatients (after your surgery is over and you are in a regular room).       Your procedure is scheduled on:  06-24-23   Report to [facility] Main Entrance    Report to admitting at 8:30 AM   Call this number if you have problems the morning of surgery [phone]   Do not eat food :After Midnight.   After Midnight you may have the following liquids until 8:00 AM DAY OF SURGERY  Water Non-Citrus Juices (without pulp, NO RED-Apple, White grape, White cranberry) Black Coffee (NO MILK/CREAM OR CREAMERS, sugar ok)  Clear Tea (NO MILK/CREAM OR CREAMERS, sugar ok) regular and decaf                             Plain Jell-O (NO RED)                                           Fruit ices (not with fruit pulp, NO RED)                                     Popsicles (NO RED)                                                               Sports drinks like Gatorade (NO RED)                   The day of surgery:  Drink ONE (1) Pre-Surgery Clear Ensure by 8:00 AM the morning of surgery. Drink in one sitting. Do not sip.  This drink was given to you during your hospital  pre-op appointment visit. Nothing else to drink after completing the Pre-Surgery Clear Ensure or G2.          If you have questions, please contact your surgeon's office.   FOLLOW  ANY ADDITIONAL PRE OP INSTRUCTIONS YOU RECEIVED FROM YOUR  SURGEON'S OFFICE!!!     Oral Hygiene is also important to reduce your risk of infection.                                    Remember - BRUSH YOUR TEETH THE MORNING OF SURGERY WITH YOUR REGULAR TOOTHPASTE   Do NOT smoke after Midnight   Take these medicines the morning of surgery with A SIP OF WATER:   Amlodipine  Atorvastatin  Hydralazine  Pantoprazole  Sertraline  Okay to use inhalers  Tramadol if neeeded  Stop all vitamins and herbal supplements 7 days before surgery                              You may not have any metal on your body including hair pins, jewelry, and body piercing             Do not wear make-up, lotions, powders, perfumes or deodorant  Do not wear nail polish including gel and S&S, artificial/acrylic nails, or any other type of covering on natural nails including finger and toenails. If you have artificial nails, gel coating, etc. that needs to be removed by a nail salon please have this removed prior to surgery or surgery may need to be canceled/ delayed if the surgeon/ anesthesia feels like they are unable to be safely monitored.   Do not shave  48 hours prior to surgery.    Do not bring valuables to the hospital. [facility] IS NOT RESPONSIBLE   FOR VALUABLES.   Contacts, dentures or bridgework may not be worn into surgery.   Bring small overnight bag day of surgery.   DO NOT BRING YOUR HOME MEDICATIONS TO THE HOSPITAL. PHARMACY WILL DISPENSE MEDICATIONS LISTED ON YOUR MEDICATION LIST TO YOU DURING YOUR ADMISSION IN THE HOSPITAL!    Patients discharged on the day of surgery will not be allowed to drive home.  Someone NEEDS to stay with you for the first 24 hours after anesthesia.              Please read over the following fact sheets you were given: IF YOU HAVE QUESTIONS ABOUT YOUR PRE-OP INSTRUCTIONS PLEASE CALL [phone]   If you received a COVID test during your pre-op visit  it is requested that you wear a mask when out in public, stay away from  anyone that may not be feeling well and notify your surgeon if you develop symptoms. If you test positive for Covid or have been in contact with anyone that has tested positive in the last 10 days please notify you surgeon.  [facility] - Preparing for Surgery Before surgery, you can play an important role.  Because skin is not sterile, your skin needs to be as free of germs as possible.  You can reduce the number of germs on your skin by washing with CHG (chlorahexidine gluconate) soap before surgery.  CHG is an antiseptic cleaner which kills germs and bonds with the skin to continue killing germs even after washing. Please DO NOT use if you have an allergy to CHG or antibacterial soaps.  If your skin becomes reddened/irritated stop using the CHG and inform your nurse when you arrive at Short Stay. Do not shave (including legs and underarms) for at least 48 hours prior to the first CHG shower.  You may shave your face/neck.  Please follow these instructions carefully:  1.  Shower with CHG Soap the night before surgery and the  morning of surgery.  2.  If you choose to wash your hair, wash your hair first as usual with your normal  shampoo.  3.  After you shampoo, rinse your hair and body thoroughly to remove the shampoo.                             4.  Use CHG  as you would any other liquid soap.  You can apply chg directly to the skin and wash.  Gently with a scrungie or clean washcloth.  5.  Apply the CHG Soap to your body ONLY FROM THE NECK DOWN.   Do   not use on face/ open                           Wound or open sores. Avoid contact with eyes, ears mouth and   genitals (private parts).                       Wash face,  Genitals (private parts) with your normal soap.             6.  Wash thoroughly, paying special attention to the area where your    surgery  will be performed.  7.  Thoroughly rinse your body with warm water from the neck down.  8.  DO NOT shower/wash with your normal soap after  using and rinsing off the CHG Soap.                9.  Pat yourself dry with a clean towel.            10.  Wear clean pajamas.            11.  Place clean sheets on your bed the night of your first shower and do not  sleep with pets. Day of Surgery : Do not apply any lotions/deodorants the morning of surgery.  Please wear clean clothes to the hospital/surgery center.  FAILURE TO FOLLOW THESE INSTRUCTIONS MAY RESULT IN THE CANCELLATION OF YOUR SURGERY  PATIENT SIGNATURE_________________________________  NURSE SIGNATURE__________________________________  ________________________________________________________________________   Anna Mcguire  An incentive spirometer is a tool that can help keep your lungs clear and active. This tool measures how well you are filling your lungs with each breath. Taking long deep breaths may help reverse or decrease the chance of developing breathing (pulmonary) problems (especially infection) following: A long period of time when you are unable to move or be active. BEFORE THE PROCEDURE  If the spirometer includes an indicator to show your best effort, your nurse or respiratory therapist will set it to a desired goal. If possible, sit up straight or lean slightly forward. Try not to slouch. Hold the incentive spirometer in an upright position. INSTRUCTIONS FOR USE  Sit on the edge of your bed if possible, or sit up as far as you can in bed or on a chair. Hold the incentive spirometer in an upright position. Breathe out normally. Place the mouthpiece in your mouth and seal your lips tightly around it. Breathe in slowly and as deeply as possible, raising the piston or the ball toward the top of the column. Hold your breath for 3-5 seconds or for as long as possible. Allow the piston or ball to fall to the bottom of the column. Remove the mouthpiece from your mouth and breathe out normally. Rest for a few seconds and repeat Steps 1 through 7 at  least 10 times every 1-2 hours when you are awake. Take your time and take a few normal breaths between deep breaths. The spirometer may include an indicator to show your best effort. Use the indicator as a goal to work toward during each repetition. After each set of 10 deep breaths, practice coughing to be sure your lungs are clear.  If you have an incision (the cut made at the time of surgery), support your incision when coughing by placing a pillow or rolled up towels firmly against it. Once you are able to get out of bed, walk around indoors and cough well. You may stop using the incentive spirometer when instructed by your caregiver.  RISKS AND COMPLICATIONS Take your time so you do not get dizzy or light-headed. If you are in pain, you may need to take or ask for pain medication before doing incentive spirometry. It is harder to take a deep breath if you are having pain. AFTER USE Rest and breathe slowly and easily. It can be helpful to keep track of a log of your progress. Your caregiver can provide you with a simple table to help with this. If you are using the spirometer at home, follow these instructions: SEEK MEDICAL CARE IF:  You are having difficultly using the spirometer. You have trouble using the spirometer as often as instructed. Your pain medication is not giving enough relief while using the spirometer. You develop fever of 100.5 F (38.1 C) or higher. SEEK IMMEDIATE MEDICAL CARE IF:  You cough up bloody sputum that had not been present before. You develop fever of 102 F (38.9 C) or greater. You develop worsening pain at or near the incision site. MAKE SURE YOU:  Understand these instructions. Will watch your condition. Will get help right away if you are not doing well or get worse. Document Released: 11/03/2006 Document Revised: 09/15/2011 Document Reviewed: 01/04/2007 [facility] Patient Information 2014 [place],  [place].   ________________________________________________________________________

## 2023-06-23 ENCOUNTER — Inpatient Hospital Stay (HOSPITAL_COMMUNITY)
Admission: RE | Admit: 2023-06-23 | Discharge: 2023-06-23 | Disposition: A | Discharge status: Home / Self Care | Payer: 59 | Source: Ambulatory Visit | Attending: Orthopedic Surgery | Admitting: Orthopedic Surgery

## 2023-06-23 ENCOUNTER — Encounter (HOSPITAL_COMMUNITY): Payer: Self-pay

## 2023-06-23 ENCOUNTER — Other Ambulatory Visit: Payer: Self-pay

## 2023-06-23 VITALS — BP 171/75 | HR 64 | Temp 98.5°F | Resp 20 | Ht 66.0 in | Wt 118.0 lb

## 2023-06-23 DIAGNOSIS — G8929 Other chronic pain: Secondary | ICD-10-CM | POA: Insufficient documentation

## 2023-06-23 DIAGNOSIS — M25562 Pain in left knee: Secondary | ICD-10-CM | POA: Insufficient documentation

## 2023-06-23 DIAGNOSIS — I10 Essential (primary) hypertension: Secondary | ICD-10-CM | POA: Insufficient documentation

## 2023-06-23 DIAGNOSIS — Z01818 Encounter for other preprocedural examination: Secondary | ICD-10-CM | POA: Insufficient documentation

## 2023-06-23 DIAGNOSIS — M25561 Pain in right knee: Secondary | ICD-10-CM | POA: Diagnosis not present

## 2023-06-23 HISTORY — DX: Anxiety disorder, unspecified: F41.9

## 2023-06-23 HISTORY — DX: Depression, unspecified: F32.A

## 2023-06-23 HISTORY — DX: Chronic obstructive pulmonary disease, unspecified: J44.9

## 2023-06-23 HISTORY — DX: Dyspnea, unspecified: R06.00

## 2023-06-23 HISTORY — DX: Inflammatory liver disease, unspecified: K75.9

## 2023-06-23 LAB — COMPREHENSIVE METABOLIC PANEL
ALT: 16 U/L (ref 0–44)
AST: 20 U/L (ref 15–41)
Albumin: 4.4 g/dL (ref 3.5–5.0)
Alkaline Phosphatase: 93 U/L (ref 38–126)
Anion gap: 7 (ref 5–15)
BUN: 15 mg/dL (ref 8–23)
CO2: 23 mmol/L (ref 22–32)
Calcium: 10.5 mg/dL — ABNORMAL HIGH (ref 8.9–10.3)
Chloride: 108 mmol/L (ref 98–111)
Creatinine, Ser: 0.79 mg/dL (ref 0.44–1.00)
GFR, Estimated: >60 mL/min (ref >60)
Glucose, Bld: 86 mg/dL (ref 70–99)
Potassium: 3.8 mmol/L (ref 3.5–5.1)
Sodium: 138 mmol/L (ref 135–145)
Total Bilirubin: 0.2 mg/dL (ref <1.2)
Total Protein: 7.1 g/dL (ref 6.5–8.1)

## 2023-06-23 LAB — CBC WITH DIFFERENTIAL/PLATELET
Abs Immature Granulocytes: 0.01 10*3/uL (ref 0.00–0.07)
Basophils Absolute: 0 10*3/uL (ref 0.0–0.1)
Basophils Relative: 1 %
Eosinophils Absolute: 0.1 10*3/uL (ref 0.0–0.5)
Eosinophils Relative: 1 %
HCT: 32.6 % — ABNORMAL LOW (ref 36.0–46.0)
Hemoglobin: 10.2 g/dL — ABNORMAL LOW (ref 12.0–15.0)
Immature Granulocytes: 0 %
Lymphocytes Relative: 28 %
Lymphs Abs: 1.3 10*3/uL (ref 0.7–4.0)
MCH: 31.7 pg (ref 26.0–34.0)
MCHC: 31.3 g/dL (ref 30.0–36.0)
MCV: 101.2 fL — ABNORMAL HIGH (ref 80.0–100.0)
Monocytes Absolute: 0.4 10*3/uL (ref 0.1–1.0)
Monocytes Relative: 8 %
Neutro Abs: 3 10*3/uL (ref 1.7–7.7)
Neutrophils Relative %: 62 %
Platelets: 268 10*3/uL (ref 150–400)
RBC: 3.22 MIL/uL — ABNORMAL LOW (ref 3.87–5.11)
RDW: 13.9 % (ref 11.5–15.5)
WBC: 4.8 10*3/uL (ref 4.0–10.5)
nRBC: 0 % (ref 0.0–0.2)

## 2023-06-23 LAB — SURGICAL PCR SCREEN
MRSA, PCR: NEGATIVE
Staphylococcus aureus: NEGATIVE

## 2023-06-23 NOTE — Anesthesia Preprocedure Evaluation (Signed)
Anesthesia Evaluation  Patient identified by MRN, date of birth, ID band Patient awake    Reviewed: Allergy & Precautions, NPO status , Patient's Chart, lab work & pertinent test results  Airway Mallampati: II  TM Distance: >3 FB Neck ROM: Full    Dental no notable dental hx. (+) Poor Dentition, Dental Advisory Given   Pulmonary shortness of breath, COPD,  COPD inhaler, Current Smoker and Patient abstained from smoking.   Pulmonary exam normal breath sounds clear to auscultation       Cardiovascular hypertension, Pt. on medications Normal cardiovascular exam Rhythm:Regular Rate:Normal     Neuro/Psych  PSYCHIATRIC DISORDERS Anxiety Depression       GI/Hepatic ,GERD  Medicated,,  Endo/Other  hyperparathyroidism  Renal/GU Renal InsufficiencyRenal diseaseLab Results      Component                Value               Date                      NA                       138                 06/23/2023                CL                       108                 06/23/2023                K                        3.8                 06/23/2023                CO2                      23                  06/23/2023                BUN                      15                  06/23/2023                CREATININE               0.79                06/23/2023                GFRNONAA                 >60                 06/23/2023                CALCIUM                  10.5 (H)            06/23/2023  Musculoskeletal  (+) Arthritis , Osteoarthritis,    Abdominal   Peds  Hematology  (+) Blood dyscrasia, anemia Lab Results      Component                Value               Date                      WBC                      4.8                 06/23/2023                HGB                      10.2 (L)            06/23/2023                HCT                      32.6 (L)            06/23/2023                MCV                       101.2 (H)           06/23/2023                PLT                      268                 06/23/2023           L   Anesthesia Other Findings All to [facility] and PCNs  Reproductive/Obstetrics                             Anesthesia Physical Anesthesia Plan  ASA: 3  Anesthesia Plan: Spinal and Regional   Post-op Pain Management: Regional block* and Minimal or no pain anticipated   Induction:   PONV Risk Score and Plan: 2 and Treatment may vary due to age or medical condition, Ondansetron, Midazolam and Dexamethasone  Airway Management Planned: Nasal Cannula, Natural Airway and Mask  Additional Equipment: None  Intra-op Plan:   Post-operative Plan:   Informed Consent: I have reviewed the patients History and Physical, chart, labs and discussed the procedure including the risks, benefits and alternatives for the proposed anesthesia with the patient or authorized representative who has indicated his/her understanding and acceptance.     Dental advisory given  Plan Discussed with: CRNA and Anesthesiologist  Anesthesia Plan Comments: (Spinal + L adductor)       Anesthesia Quick Evaluation

## 2023-06-23 NOTE — Progress Notes (Addendum)
COVID Vaccine Completed:  Yes  Date of COVID positive in last 90 days:  No  PCP - Jonah Blue, MD Cardiologist - N/A  Chest x-ray -  N/A EKG - 06-23-23 Epic Stress Test -  N/A ECHO -  N/A Cardiac Cath - N/A Pacemaker/ICD device last checked: Spinal Cord Stimulator: N/A  Bowel Prep -  N/A  Sleep Study -  N/A CPAP -   Fasting Blood Sugar -  N/A Checks Blood Sugar _____ times a day  Last dose of GLP1 agonist-  N/A GLP1 instructions:  Hold 7 days before surgery    Last dose of SGLT-2 inhibitors-  N/A SGLT-2 instructions:  Hold 3 days before surgery   Blood Thinner Instructions:   N/A Aspirin Instructions: Last Dose:  Activity level:  Can go up a flight of stairs and perform activities of daily living without stopping and without symptoms of chest pain.  Patient states that she has shortness of breath with exertion.  SOB has not worsened.  Anesthesia review:  COPD with shortness of breath.  BP elevated at PAT, patient stated that she has not taken medicine today.  Denies any associated symptoms.  Patient denies shortness of breath, fever, cough and chest pain at PAT appointment  Patient verbalized understanding of instructions that were given to them at the PAT appointment. Patient was also instructed that they will need to review over the PAT instructions again at home before surgery.

## 2023-06-24 ENCOUNTER — Ambulatory Visit (HOSPITAL_COMMUNITY): Payer: 59 | Admitting: Physician Assistant

## 2023-06-24 ENCOUNTER — Other Ambulatory Visit: Payer: Self-pay

## 2023-06-24 ENCOUNTER — Encounter (HOSPITAL_COMMUNITY)
Admission: RE | Disposition: A | Discharge status: Home / Self Care | Payer: Self-pay | Source: Home / Self Care | Attending: Orthopedic Surgery

## 2023-06-24 ENCOUNTER — Observation Stay (HOSPITAL_COMMUNITY): Payer: 59

## 2023-06-24 ENCOUNTER — Ambulatory Visit (HOSPITAL_COMMUNITY): Payer: 59 | Admitting: Anesthesiology

## 2023-06-24 ENCOUNTER — Encounter (HOSPITAL_COMMUNITY): Payer: Self-pay | Admitting: Orthopedic Surgery

## 2023-06-24 ENCOUNTER — Observation Stay (HOSPITAL_COMMUNITY)
Admission: RE | Admit: 2023-06-24 | Discharge: 2023-06-25 | Disposition: A | Discharge status: Home / Self Care | Payer: 59 | Attending: Orthopedic Surgery | Admitting: Orthopedic Surgery

## 2023-06-24 DIAGNOSIS — M1712 Unilateral primary osteoarthritis, left knee: Principal | ICD-10-CM | POA: Diagnosis present

## 2023-06-24 DIAGNOSIS — M21062 Valgus deformity, not elsewhere classified, left knee: Secondary | ICD-10-CM | POA: Insufficient documentation

## 2023-06-24 DIAGNOSIS — I129 Hypertensive chronic kidney disease with stage 1 through stage 4 chronic kidney disease, or unspecified chronic kidney disease: Secondary | ICD-10-CM | POA: Diagnosis not present

## 2023-06-24 DIAGNOSIS — F418 Other specified anxiety disorders: Secondary | ICD-10-CM

## 2023-06-24 DIAGNOSIS — F17210 Nicotine dependence, cigarettes, uncomplicated: Secondary | ICD-10-CM | POA: Diagnosis not present

## 2023-06-24 DIAGNOSIS — Z96652 Presence of left artificial knee joint: Secondary | ICD-10-CM | POA: Diagnosis not present

## 2023-06-24 DIAGNOSIS — G8918 Other acute postprocedural pain: Secondary | ICD-10-CM | POA: Diagnosis not present

## 2023-06-24 DIAGNOSIS — J449 Chronic obstructive pulmonary disease, unspecified: Secondary | ICD-10-CM | POA: Insufficient documentation

## 2023-06-24 DIAGNOSIS — N1832 Chronic kidney disease, stage 3b: Secondary | ICD-10-CM | POA: Insufficient documentation

## 2023-06-24 DIAGNOSIS — I10 Essential (primary) hypertension: Secondary | ICD-10-CM | POA: Diagnosis not present

## 2023-06-24 DIAGNOSIS — Z471 Aftercare following joint replacement surgery: Secondary | ICD-10-CM | POA: Diagnosis not present

## 2023-06-24 DIAGNOSIS — M25462 Effusion, left knee: Secondary | ICD-10-CM | POA: Diagnosis not present

## 2023-06-24 DIAGNOSIS — Z79899 Other long term (current) drug therapy: Secondary | ICD-10-CM | POA: Diagnosis not present

## 2023-06-24 HISTORY — PX: TOTAL KNEE ARTHROPLASTY: SHX125

## 2023-06-24 LAB — TYPE AND SCREEN
ABO/RH(D): O POS
Antibody Screen: NEGATIVE

## 2023-06-24 LAB — ABO/RH: ABO/RH(D): O POS

## 2023-06-24 SURGERY — ARTHROPLASTY, KNEE, TOTAL
Anesthesia: Regional | Site: Knee | Laterality: Left

## 2023-06-24 MED ORDER — ONDANSETRON HCL 4 MG/2ML IJ SOLN
INTRAMUSCULAR | Status: DC | PRN
  Administered 2023-06-24: 4 mg via INTRAVENOUS

## 2023-06-24 MED ORDER — UMECLIDINIUM BROMIDE 62.5 MCG/ACT IN AEPB
1.0000 | INHALATION_SPRAY | Freq: Every day | RESPIRATORY_TRACT | Status: DC
  Administered 2023-06-25: 1 via RESPIRATORY_TRACT
  Filled 2023-06-24: qty 7

## 2023-06-24 MED ORDER — FENTANYL CITRATE PF 50 MCG/ML IJ SOSY
PREFILLED_SYRINGE | INTRAMUSCULAR | Status: AC
  Filled 2023-06-24: qty 1

## 2023-06-24 MED ORDER — METHOCARBAMOL 500 MG PO TABS
ORAL_TABLET | ORAL | Status: AC
  Filled 2023-06-24: qty 1

## 2023-06-24 MED ORDER — DEXAMETHASONE SODIUM PHOSPHATE 10 MG/ML IJ SOLN
8.0000 mg | Freq: Once | INTRAMUSCULAR | Status: AC
  Administered 2023-06-24: 10 mg via INTRAVENOUS

## 2023-06-24 MED ORDER — SERTRALINE HCL 25 MG PO TABS
25.0000 mg | ORAL_TABLET | Freq: Every day | ORAL | Status: DC
  Administered 2023-06-25: 25 mg via ORAL
  Filled 2023-06-24: qty 1

## 2023-06-24 MED ORDER — BUPIVACAINE IN DEXTROSE 0.75-8.25 % IT SOLN
INTRATHECAL | Status: DC | PRN
  Administered 2023-06-24: 12.75 mg via INTRATHECAL

## 2023-06-24 MED ORDER — HYDRALAZINE HCL 10 MG PO TABS
10.0000 mg | ORAL_TABLET | Freq: Three times a day (TID) | ORAL | Status: DC
  Administered 2023-06-24 – 2023-06-25 (×2): 10 mg via ORAL
  Filled 2023-06-24 (×4): qty 1

## 2023-06-24 MED ORDER — ACETAMINOPHEN 500 MG PO TABS
1000.0000 mg | ORAL_TABLET | Freq: Four times a day (QID) | ORAL | Status: AC
  Administered 2023-06-24 – 2023-06-25 (×4): 1000 mg via ORAL
  Filled 2023-06-24 (×4): qty 2

## 2023-06-24 MED ORDER — SODIUM CHLORIDE 0.9 % IV SOLN: INTRAVENOUS | Status: DC

## 2023-06-24 MED ORDER — ALBUTEROL SULFATE HFA 108 (90 BASE) MCG/ACT IN AERS: 1.0000 | INHALATION_SPRAY | Freq: Four times a day (QID) | RESPIRATORY_TRACT | Status: DC | PRN

## 2023-06-24 MED ORDER — SODIUM CHLORIDE (PF) 0.9 % IJ SOLN
INTRAMUSCULAR | Status: DC | PRN
  Administered 2023-06-24: 80 mL

## 2023-06-24 MED ORDER — ONDANSETRON HCL 4 MG PO TABS: 4.0000 mg | ORAL_TABLET | Freq: Four times a day (QID) | ORAL | Status: DC | PRN

## 2023-06-24 MED ORDER — CEFAZOLIN SODIUM-DEXTROSE 2-4 GM/100ML-% IV SOLN
2.0000 g | Freq: Four times a day (QID) | INTRAVENOUS | Status: AC
  Administered 2023-06-24 – 2023-06-25 (×2): 2 g via INTRAVENOUS
  Filled 2023-06-24 (×2): qty 100

## 2023-06-24 MED ORDER — ACETAMINOPHEN 500 MG PO TABS
1000.0000 mg | ORAL_TABLET | Freq: Once | ORAL | Status: AC
  Administered 2023-06-24: 1000 mg via ORAL
  Filled 2023-06-24: qty 2

## 2023-06-24 MED ORDER — BUPIVACAINE LIPOSOME 1.3 % IJ SUSP
INTRAMUSCULAR | Status: AC
Start: 2023-06-24 — End: ?
  Filled 2023-06-24: qty 20

## 2023-06-24 MED ORDER — IRBESARTAN 75 MG PO TABS
75.0000 mg | ORAL_TABLET | Freq: Every day | ORAL | Status: DC
Start: 2023-06-25 — End: 2023-06-25
  Administered 2023-06-25: 75 mg via ORAL
  Filled 2023-06-24: qty 1

## 2023-06-24 MED ORDER — KETOROLAC TROMETHAMINE 15 MG/ML IJ SOLN
INTRAMUSCULAR | Status: AC
  Filled 2023-06-24: qty 1

## 2023-06-24 MED ORDER — TIOTROPIUM BROMIDE MONOHYDRATE 18 MCG IN CAPS: 18.0000 ug | ORAL_CAPSULE | Freq: Every day | RESPIRATORY_TRACT | Status: DC

## 2023-06-24 MED ORDER — HYDROMORPHONE HCL 1 MG/ML IJ SOLN: 0.5000 mg | INTRAMUSCULAR | Status: DC | PRN

## 2023-06-24 MED ORDER — ACETAMINOPHEN 10 MG/ML IV SOLN: 1000.0000 mg | Freq: Once | INTRAVENOUS | Status: DC | PRN

## 2023-06-24 MED ORDER — CHLORHEXIDINE GLUCONATE 0.12 % MT SOLN
15.0000 mL | Freq: Once | OROMUCOSAL | Status: AC
  Administered 2023-06-24: 15 mL via OROMUCOSAL

## 2023-06-24 MED ORDER — 0.9 % SODIUM CHLORIDE (POUR BTL) OPTIME
TOPICAL | Status: DC | PRN
  Administered 2023-06-24: 1000 mL

## 2023-06-24 MED ORDER — KETOROLAC TROMETHAMINE 15 MG/ML IJ SOLN
7.5000 mg | Freq: Four times a day (QID) | INTRAMUSCULAR | Status: AC
  Administered 2023-06-24 – 2023-06-25 (×4): 7.5 mg via INTRAVENOUS
  Filled 2023-06-24 (×3): qty 1

## 2023-06-24 MED ORDER — ASPIRIN 81 MG PO CHEW
81.0000 mg | CHEWABLE_TABLET | Freq: Two times a day (BID) | ORAL | Status: DC
  Administered 2023-06-24 – 2023-06-25 (×2): 81 mg via ORAL
  Filled 2023-06-24 (×2): qty 1

## 2023-06-24 MED ORDER — OXYCODONE HCL 5 MG PO TABS
5.0000 mg | ORAL_TABLET | ORAL | Status: DC | PRN
  Administered 2023-06-25: 10 mg via ORAL
  Filled 2023-06-24: qty 2

## 2023-06-24 MED ORDER — ALBUTEROL SULFATE (2.5 MG/3ML) 0.083% IN NEBU: 2.5000 mg | INHALATION_SOLUTION | Freq: Four times a day (QID) | RESPIRATORY_TRACT | Status: DC | PRN

## 2023-06-24 MED ORDER — MENTHOL 3 MG MT LOZG: 1.0000 | LOZENGE | OROMUCOSAL | Status: DC | PRN

## 2023-06-24 MED ORDER — ROPIVACAINE HCL 5 MG/ML IJ SOLN
INTRAMUSCULAR | Status: DC | PRN
  Administered 2023-06-24: 25 mL via PERINEURAL

## 2023-06-24 MED ORDER — SODIUM CHLORIDE 0.9 % IR SOLN: Status: DC | PRN

## 2023-06-24 MED ORDER — SODIUM CHLORIDE 0.9 % IR SOLN
Status: DC | PRN
  Administered 2023-06-24: 3000 mL

## 2023-06-24 MED ORDER — ONDANSETRON HCL 4 MG/2ML IJ SOLN: 4.0000 mg | Freq: Once | INTRAMUSCULAR | Status: DC | PRN

## 2023-06-24 MED ORDER — MOMETASONE FURO-FORMOTEROL FUM 100-5 MCG/ACT IN AERO
2.0000 | INHALATION_SPRAY | Freq: Two times a day (BID) | RESPIRATORY_TRACT | Status: DC
  Administered 2023-06-24 – 2023-06-25 (×2): 2 via RESPIRATORY_TRACT
  Filled 2023-06-24: qty 8.8

## 2023-06-24 MED ORDER — FENTANYL CITRATE PF 50 MCG/ML IJ SOSY
100.0000 ug | PREFILLED_SYRINGE | Freq: Once | INTRAMUSCULAR | Status: AC
  Administered 2023-06-24: 50 ug via INTRAVENOUS
  Filled 2023-06-24: qty 2

## 2023-06-24 MED ORDER — ATORVASTATIN CALCIUM 20 MG PO TABS
80.0000 mg | ORAL_TABLET | Freq: Every day | ORAL | Status: DC
Start: 2023-06-25 — End: 2023-06-25
  Administered 2023-06-25: 80 mg via ORAL
  Filled 2023-06-24: qty 4

## 2023-06-24 MED ORDER — TRANEXAMIC ACID-NACL 1000-0.7 MG/100ML-% IV SOLN
1000.0000 mg | INTRAVENOUS | Status: AC
  Administered 2023-06-24: 1000 mg via INTRAVENOUS
  Filled 2023-06-24: qty 100

## 2023-06-24 MED ORDER — BUPIVACAINE-EPINEPHRINE 0.25% -1:200000 IJ SOLN
INTRAMUSCULAR | Status: AC
  Filled 2023-06-24: qty 1

## 2023-06-24 MED ORDER — POLYETHYLENE GLYCOL 3350 17 G PO PACK: 17.0000 g | PACK | Freq: Every day | ORAL | Status: DC | PRN

## 2023-06-24 MED ORDER — LACTATED RINGERS IV SOLN
INTRAVENOUS | Status: AC
Start: 2023-06-24 — End: 2023-06-24

## 2023-06-24 MED ORDER — SODIUM CHLORIDE (PF) 0.9 % IJ SOLN
INTRAMUSCULAR | Status: AC
  Filled 2023-06-24: qty 30

## 2023-06-24 MED ORDER — POVIDONE-IODINE 10 % EX SWAB
2.0000 | Freq: Once | CUTANEOUS | Status: DC
Start: 2023-06-24 — End: 2023-06-24

## 2023-06-24 MED ORDER — DIPHENHYDRAMINE HCL 12.5 MG/5ML PO ELIX: 12.5000 mg | ORAL_SOLUTION | ORAL | Status: DC | PRN

## 2023-06-24 MED ORDER — ACETAMINOPHEN 500 MG PO TABS
ORAL_TABLET | ORAL | Status: AC
  Filled 2023-06-24: qty 2

## 2023-06-24 MED ORDER — PROPOFOL 1000 MG/100ML IV EMUL
INTRAVENOUS | Status: AC
  Filled 2023-06-24: qty 100

## 2023-06-24 MED ORDER — LACTATED RINGERS IV SOLN: INTRAVENOUS | Status: DC

## 2023-06-24 MED ORDER — PANTOPRAZOLE SODIUM 40 MG PO TBEC
40.0000 mg | DELAYED_RELEASE_TABLET | Freq: Every day | ORAL | Status: DC
  Administered 2023-06-24 – 2023-06-25 (×2): 40 mg via ORAL
  Filled 2023-06-24 (×2): qty 1

## 2023-06-24 MED ORDER — METHOCARBAMOL 500 MG PO TABS
500.0000 mg | ORAL_TABLET | Freq: Four times a day (QID) | ORAL | Status: DC | PRN
  Administered 2023-06-24 – 2023-06-25 (×2): 500 mg via ORAL
  Filled 2023-06-24: qty 1

## 2023-06-24 MED ORDER — FENTANYL CITRATE PF 50 MCG/ML IJ SOSY
25.0000 ug | PREFILLED_SYRINGE | INTRAMUSCULAR | Status: DC | PRN
  Administered 2023-06-24: 50 ug via INTRAVENOUS

## 2023-06-24 MED ORDER — PROPOFOL 10 MG/ML IV BOLUS
INTRAVENOUS | Status: DC | PRN
  Administered 2023-06-24: 20 mg via INTRAVENOUS

## 2023-06-24 MED ORDER — MIDAZOLAM HCL 2 MG/2ML IJ SOLN: 2.0000 mg | Freq: Once | INTRAMUSCULAR | Status: DC

## 2023-06-24 MED ORDER — ONDANSETRON HCL 4 MG/2ML IJ SOLN
4.0000 mg | Freq: Four times a day (QID) | INTRAMUSCULAR | Status: DC | PRN
Start: 2023-06-24 — End: 2023-06-25

## 2023-06-24 MED ORDER — AMLODIPINE BESYLATE 10 MG PO TABS
10.0000 mg | ORAL_TABLET | Freq: Every day | ORAL | Status: DC
  Administered 2023-06-25: 10 mg via ORAL
  Filled 2023-06-24: qty 1

## 2023-06-24 MED ORDER — PHENOL 1.4 % MT LIQD: 1.0000 | OROMUCOSAL | Status: DC | PRN

## 2023-06-24 MED ORDER — ORAL CARE MOUTH RINSE: 15.0000 mL | Freq: Once | OROMUCOSAL | Status: AC

## 2023-06-24 MED ORDER — ACETAMINOPHEN 325 MG PO TABS
325.0000 mg | ORAL_TABLET | Freq: Four times a day (QID) | ORAL | Status: DC | PRN
Start: 2023-06-25 — End: 2023-06-25

## 2023-06-24 MED ORDER — BUPIVACAINE LIPOSOME 1.3 % IJ SUSP: 20.0000 mL | Freq: Once | INTRAMUSCULAR | Status: DC

## 2023-06-24 MED ORDER — CEFAZOLIN SODIUM-DEXTROSE 2-4 GM/100ML-% IV SOLN
2.0000 g | INTRAVENOUS | Status: AC
Start: 2023-06-24 — End: 2023-06-24
  Administered 2023-06-24: 2 g via INTRAVENOUS
  Filled 2023-06-24: qty 100

## 2023-06-24 MED ORDER — DOCUSATE SODIUM 100 MG PO CAPS
100.0000 mg | ORAL_CAPSULE | Freq: Two times a day (BID) | ORAL | Status: DC
  Administered 2023-06-24 – 2023-06-25 (×2): 100 mg via ORAL
  Filled 2023-06-24 (×2): qty 1

## 2023-06-24 MED ORDER — METHOCARBAMOL 1000 MG/10ML IJ SOLN: 500.0000 mg | Freq: Four times a day (QID) | INTRAMUSCULAR | Status: DC | PRN

## 2023-06-24 MED ORDER — PHENYLEPHRINE HCL-NACL 20-0.9 MG/250ML-% IV SOLN
INTRAVENOUS | Status: AC
  Filled 2023-06-24: qty 250

## 2023-06-24 MED ORDER — CLONIDINE HCL (ANALGESIA) 100 MCG/ML EP SOLN
EPIDURAL | Status: DC | PRN
  Administered 2023-06-24: 100 ug

## 2023-06-24 MED ORDER — PROPOFOL 500 MG/50ML IV EMUL
INTRAVENOUS | Status: DC | PRN
  Administered 2023-06-24: 50 ug/kg/min via INTRAVENOUS

## 2023-06-24 SURGICAL SUPPLY — 54 items
BAG COUNTER SPONGE SURGICOUNT (BAG) IMPLANT
BLADE SAG 18X100X1.27 (BLADE) ×1 IMPLANT
BLADE SAW SAG 35X64 .89 (BLADE) ×1 IMPLANT
BNDG COHESIVE 3X5 TAN ST LF (GAUZE/BANDAGES/DRESSINGS) ×1 IMPLANT
BNDG ELASTIC 6X10 VLCR STRL LF (GAUZE/BANDAGES/DRESSINGS) ×1 IMPLANT
BOWL SMART MIX CTS (DISPOSABLE) ×1 IMPLANT
CEMENT BONE REFOBACIN R1X40 US (Cement) IMPLANT
CHLORAPREP W/TINT 26 (MISCELLANEOUS) ×2 IMPLANT
COVER SURGICAL LIGHT HANDLE (MISCELLANEOUS) ×1 IMPLANT
CUFF TRNQT CYL 34X4.125X (TOURNIQUET CUFF) ×1 IMPLANT
DERMABOND ADVANCED .7 DNX12 (GAUZE/BANDAGES/DRESSINGS) ×1 IMPLANT
DRAPE INCISE IOBAN 85X60 (DRAPES) ×1 IMPLANT
DRAPE SHEET LG 3/4 BI-LAMINATE (DRAPES) ×1 IMPLANT
DRAPE U-SHAPE 47X51 STRL (DRAPES) ×1 IMPLANT
DRSG AQUACEL AG ADV 3.5X10 (GAUZE/BANDAGES/DRESSINGS) ×1 IMPLANT
ELECT REM PT RETURN 15FT ADLT (MISCELLANEOUS) ×1 IMPLANT
FEMUR CMT CCR STD SZ8 L KNEE (Knees) ×1 IMPLANT
FEMUR CMTD CCR STD SZ8 L KNEE (Knees) IMPLANT
GAUZE SPONGE 4X4 12PLY STRL (GAUZE/BANDAGES/DRESSINGS) ×1 IMPLANT
GLOVE BIO SURGEON STRL SZ 6.5 (GLOVE) ×2 IMPLANT
GLOVE BIOGEL PI IND STRL 6.5 (GLOVE) ×1 IMPLANT
GLOVE BIOGEL PI IND STRL 8 (GLOVE) ×1 IMPLANT
GLOVE SURG ORTHO 8.0 STRL STRW (GLOVE) ×2 IMPLANT
GOWN STRL REUS W/ TWL XL LVL3 (GOWN DISPOSABLE) ×2 IMPLANT
HOLDER FOLEY CATH W/STRAP (MISCELLANEOUS) ×1 IMPLANT
HOOD PEEL AWAY T7 (MISCELLANEOUS) ×3 IMPLANT
KIT TURNOVER KIT A (KITS) IMPLANT
LINER TIB KNEE PS EF/6-9 12 LT (Liner) IMPLANT
MANIFOLD NEPTUNE II (INSTRUMENTS) ×1 IMPLANT
MARKER SKIN DUAL TIP RULER LAB (MISCELLANEOUS) ×1 IMPLANT
NS IRRIG 1000ML POUR BTL (IV SOLUTION) ×1 IMPLANT
PACK TOTAL KNEE CUSTOM (KITS) ×1 IMPLANT
PIN DRILL HDLS TROCAR 75 4PK (PIN) IMPLANT
SCREW HEADED 33MM KNEE (MISCELLANEOUS) IMPLANT
SET HNDPC FAN SPRY TIP SCT (DISPOSABLE) ×1 IMPLANT
SOLUTION IRRIG SURGIPHOR (IV SOLUTION) IMPLANT
SPIKE FLUID TRANSFER (MISCELLANEOUS) ×1 IMPLANT
STEM POLY PAT PLY 35M KNEE (Knees) IMPLANT
STEM TIB ST PERS 14+30 (Stem) IMPLANT
STEM TIBIA 5 DEG SZ F L KNEE (Knees) IMPLANT
STRIP CLOSURE SKIN 1/2X4 (GAUZE/BANDAGES/DRESSINGS) ×1 IMPLANT
SUT MNCRL AB 3-0 PS2 18 (SUTURE) ×1 IMPLANT
SUT STRATAFIX 0 PDS 27 VIOLET (SUTURE) ×1
SUT STRATAFIX 14 PDO 48 VLT (SUTURE) ×1 IMPLANT
SUT STRATAFIX PDO 1 14 VIOLET (SUTURE) ×1
SUT VIC AB 2-0 CT2 27 (SUTURE) ×2 IMPLANT
SUT VICRYL+ 3-0 36IN CT-1 (SUTURE) IMPLANT
SUTURE STRATFX 0 PDS 27 VIOLET (SUTURE) ×1 IMPLANT
SYR 50ML LL SCALE MARK (SYRINGE) ×1 IMPLANT
TIBIA STEM 5 DEG SZ F L KNEE (Knees) ×1 IMPLANT
TRAY FOLEY MTR SLVR 14FR STAT (SET/KITS/TRAYS/PACK) IMPLANT
TUBE SUCTION HIGH CAP CLEAR NV (SUCTIONS) ×1 IMPLANT
UNDERPAD 30X36 HEAVY ABSORB (UNDERPADS AND DIAPERS) ×1 IMPLANT
WRAP KNEE MAXI GEL POST OP (GAUZE/BANDAGES/DRESSINGS) IMPLANT

## 2023-06-24 NOTE — Interval H&P Note (Signed)
The patient has been re-examined, and the chart reviewed, and there have been no interval changes to the documented history and physical.    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   The risks, benefits, and alternatives have been discussed at length with patient, and the patient is willing to proceed.  Left knee marked. Consent has been signed.

## 2023-06-24 NOTE — Anesthesia Procedure Notes (Signed)
Spinal  Patient location during procedure: OR Start time: 06/24/2023 11:55 AM End time: 06/24/2023 12:01 PM Reason for block: surgical anesthesia Staffing Performed: anesthesiologist  Anesthesiologist: Trevor Iha, MD Performed by: Trevor Iha, MD Authorized by: Trevor Iha, MD   Preanesthetic Checklist Completed: patient identified, IV checked, risks and benefits discussed, surgical consent, monitors and equipment checked, pre-op evaluation and timeout performed Spinal Block Patient position: sitting Prep: DuraPrep and site prepped and draped Patient monitoring: heart rate, cardiac monitor, continuous pulse ox and blood pressure Approach: midline Location: L3-4 Injection technique: single-shot Needle Needle type: Pencan  Needle gauge: 24 G Needle length: 10 cm Assessment Sensory level: T4 Events: CSF return Additional Notes  Attempt (s). Pt tolerated procedure well.

## 2023-06-24 NOTE — Transfer of Care (Signed)
Immediate Anesthesia Transfer of Care Note  Patient: Anna Mcguire  Procedure(s) Performed: TOTAL KNEE ARTHROPLASTY (Left: Knee)  Patient Location: PACU  Anesthesia Type:MAC, Regional, and Spinal  Level of Consciousness: awake, alert , and oriented  Airway & Oxygen Therapy: Patient Spontanous Breathing and Patient connected to nasal cannula oxygen  Post-op Assessment: Report given to RN and Post -op Vital signs reviewed and stable  Post vital signs: Reviewed and stable  Last Vitals:  Vitals Value Taken Time  BP 141/77 06/24/23 1430  Temp    Pulse    Resp 17 06/24/23 1430  SpO2    Vitals shown include unfiled device data.  Last Pain:  Vitals:   06/24/23 0926  TempSrc:   PainSc: 0-No pain         Complications: No notable events documented.

## 2023-06-24 NOTE — Anesthesia Procedure Notes (Signed)
Anesthesia Regional Block: Adductor canal block   Pre-Anesthetic Checklist: , timeout performed,  Correct Patient, Correct Site, Correct Laterality,  Correct Procedure, Correct Position, site marked,  Risks and benefits discussed,  Surgical consent,  Pre-op evaluation,  At surgeon's request and post-op pain management  Laterality: Lower and Left  Prep: chloraprep       Needles:  Injection technique: Single-shot  Needle Type: Echogenic Needle     Needle Length: 9cm  Needle Gauge: 22     Additional Needles:   Procedures:,,,, ultrasound used (permanent image in chart),,    Narrative:  Start time: 06/24/2023 10:33 AM End time: 06/24/2023 10:39 AM Injection made incrementally with aspirations every 5 mL.  Performed by: Personally  Anesthesiologist: Trevor Iha, MD  Additional Notes: Block assessed prior to surgery. Pt tolerated procedure well.

## 2023-06-24 NOTE — Plan of Care (Signed)
  Problem: Clinical Measurements: Goal: Ability to maintain clinical measurements within normal limits will improve Outcome: Progressing   

## 2023-06-24 NOTE — Op Note (Signed)
DATE OF SURGERY:  06/24/2023 TIME: 2:02 PM  PATIENT NAME:  Anna Mcguire   AGE: 67 y.o.    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   OPERATIVE IMPLANTS:  Cemented Zimmer persona left size 8 standard PS femur, left tibial baseplate with 30 mm stem extension, 12 mm CPS poly insert Implant Name Type Inv. Item Serial No. Manufacturer Lot No. LRB No. Used Action  CEMENT BONE REFOBACIN [id number] [place] - [id number] Cement CEMENT BONE REFOBACIN [id number] [place]  ZIMMER RECON(ORTH,TRAU,BIO,SG) [id number] Left 2 Implanted  STEM TIB ST PERS 14+30 - [id number] Stem STEM TIB ST PERS 14+30  ZIMMER RECON(ORTH,TRAU,BIO,SG) [id number] Left 1 Implanted  STEM POLY PAT PLY 45M KNEE - [id number] Knees STEM POLY PAT PLY 45M KNEE  ZIMMER RECON(ORTH,TRAU,BIO,SG) [id number] Left 1 Implanted  TIBIA STEM 5 DEG SZ F L KNEE - [id number] Knees TIBIA STEM 5 DEG SZ F L KNEE  ZIMMER RECON(ORTH,TRAU,BIO,SG) [id number] Left 1 Implanted  FEMUR CMT CCR STD SZ8 L KNEE - [id number] Knees FEMUR CMT CCR STD SZ8 L KNEE  ZIMMER RECON(ORTH,TRAU,BIO,SG) [id number] Left 1 Implanted  LINER TIB KNEE PS EF/6-9 12 LT - [id number] Liner LINER TIB KNEE PS EF/6-9 12 LT  ZIMMER RECON(ORTH,TRAU,BIO,SG) [id number] Left 1 Implanted      PREOPERATIVE INDICATIONS:  Anna Mcguire is a 66 y.o. year old female with end stage bone on bone degenerative arthritis of the knee who failed conservative treatment, including injections, antiinflammatories, activity modification, and assistive devices, and had significant impairment of their activities of daily living, and elected for Total Knee Arthroplasty.   The risks, benefits, and  alternatives were discussed at length including but not limited to the risks of infection, bleeding, nerve injury, stiffness, blood clots, the need for revision surgery, cardiopulmonary complications, among others, and they were willing to proceed.  OPERATIVE FINDINGS AND UNIQUE ASPECTS OF THE CASE: Severe valgus deformity with significant MCL laxity and deficiency elected for CPS poly for added stability  ESTIMATED BLOOD LOSS: 50cc  OPERATIVE DESCRIPTION:   Once adequate anesthesia was induced, preoperative antibiotics, 2 gm of ancef,1 gm of Tranexamic Acid, and 8 mg of Decadron administered, the patient was positioned supine with a left thigh tourniquet placed.  The left lower extremity was prepped and draped in sterile fashion.  A time-  out was performed identifying the patient, planned procedure, and the appropriate extremity.     The leg was  exsanguinated, tourniquet elevated to 250 mmHg.  A midline incision was  made followed by median parapatellar arthrotomy. Anterior horn of the medial meniscus was released and resected. A medial release was performed, the infrapatellar fat pad was resected with care taken to protect the patellar tendon. The suprapatellar fat was removed to exposed the distal anterior femur. The anterior horn of the lateral meniscus and ACL were released.    Following initial  exposure, I first started with the femur  The femoral  canal was opened with a drill, canal was suctioned to try to prevent fat emboli.  An  intramedullary rod was passed set at 6 degrees valgus,  10mm. The distal femur was resected.  Following this resection, the tibia was  subluxated anteriorly.  Using the extramedullary guide, 2mm of bone was resected off  the  proximal lateral tibia.  Even with this resection there was still significant posterior lateral tibial bone loss.  Elected to resect additional 2 mm at this point only had a minimal far posterior defect.  Felt the tibial surface was amenable to  primary baseplate fixation.  Elected to use a 30 mm stem extension for additional stability.  We confirmed the gap would be  stable medially and laterally with a size 10mm spacer block as well as confirmed that the tibial cut was perpendicular in the coronal plane, checking with an alignment rod.    Once this was done, the posterior femoral referencing femoral sizer was placed under to the posterior condyles with 3 degrees of external rotational which was parallel to the transepicondylar axis and perpendicular to [employer]. The femur was sized to be a size 8 in the anterior-  posterior dimension. The  anterior, posterior, and  chamfer cuts were made without difficulty nor  notching making certain that I was along the anterior cortex to help  with flexion gap stability. Next a laminar spreader was placed with the knee in flexion and the medial lateral menisci were resected.  5 cc of the Exparel mixture was injected in the medial side of the back of the knee and 3 cc in the lateral side.  1/2 inch curved osteotome was used to resect posterior osteophyte that was then removed with a pituitary rongeur.       At this point, the tibia was sized to be a size F.  The size F tray was  then pinned in position. Trial reduction was now carried with a 8 femur, F tibia, a 10 mm MC insert.  The knee had significant medial laxity so elected to convert to a PS femur and a CPS insert for additional varus valgus stability.    Attention was next directed to the patella.  Precut  measurement was noted to be 24 mm.  I resected down to 14 mm and used a  35mm patellar button to restore patellar height as well as cover the cut surface.     The patella lug holes were drilled and a 35 mm patella poly trial was placed.    The knee was brought to full extension with good flexion stability with the patella tracking through the trochlea without application of pressure.     Next the femoral component was again assessed and  determined to be seated and appropriately lateralized.  The femoral lug holes were drilled.  The femoral component was then removed. Tibial component was again assessed and felt to be seated and appropriately rotated with the medial third of the tubercle. The tibia was then drilled, and keel punched.     Final components were  opened and antibiotic cement was mixed.      Final implants were then  cemented onto cleaned and dried cut surfaces of bone with the knee brought to extension with a 12 mm CPS poly.  The knee was irrigated with sterile Betadine diluted in saline as well as pulse lavage normal saline. The synovial lining was  then injected a dilute Exparel with 30cc of 0.25% marcaine with epinephrine.         Once the cement had fully cured, excess cement was removed throughout the knee.  I confirmed that I was satisfied with the range of motion and stability, and the final 12 mm CPS poly insert was chosen.  It was placed into the knee.  The tourniquet had been let down at 71 minutes.  No significant hemostasis was required.  The medial parapatellar arthrotomy was then reapproximated using #1 Stratafix sutures with the knee  in flexion.  The remaining wound was closed with 0 stratafix, 2-0 Vicryl, and running 3-0 Monocryl. The knee was cleaned, dried, dressed sterilely using Dermabond and   Aquacel dressing.  The patient was then brought to recovery room in stable condition, tolerating the procedure  well. There were no complications.   Post op recs: WB: WBAT Abx: ancef Imaging: PACU xrays DVT prophylaxis: Aspirin 81mg  BID x4 weeks Follow up: 2 weeks after surgery for a wound check with Dr. Blanchie Dessert at [facility]  Address: [address], [place], [place] [address]  Office Phone: [phone]  Weber Cooks, MD Orthopaedic Surgery

## 2023-06-24 NOTE — Discharge Instructions (Signed)
INSTRUCTIONS AFTER JOINT REPLACEMENT   Remove items at home which could result in a fall. This includes throw rugs or furniture in walking pathways ICE to the affected joint every three hours while awake for 30 minutes at a time, for at least the first 3-5 days, and then as needed for pain and swelling.  Continue to use ice for pain and swelling. You may notice swelling that will progress down to the foot and ankle.  This is normal after surgery.  Elevate your leg when you are not up walking on it.   Continue to use the breathing machine you got in the hospital (incentive spirometer) which will help keep your temperature down.  It is common for your temperature to cycle up and down following surgery, especially at night when you are not up moving around and exerting yourself.  The breathing machine keeps your lungs expanded and your temperature down.  DIET:  As you were doing prior to hospitalization, we recommend a well-balanced diet.  DRESSING / WOUND CARE / SHOWERING:  Keep the surgical dressing until follow up.  The dressing is water proof, so you can shower without any extra covering.  IF THE DRESSING FALLS OFF or the wound gets wet inside, change the dressing with sterile gauze.  Please use good hand washing techniques before changing the dressing.  Do not use any lotions or creams on the incision until instructed by your surgeon.    ACTIVITY  Increase activity slowly as tolerated, but follow the weight bearing instructions below.   No driving for 6 weeks or until further direction given by your physician.  You cannot drive while taking narcotics.  No lifting or carrying greater than 10 lbs. until further directed by your surgeon. Avoid periods of inactivity such as sitting longer than an hour when not asleep. This helps prevent blood clots.  You may return to work once you are authorized by your doctor.   WEIGHT BEARING: Weight bearing as tolerated with assist device (walker, cane, etc) as  directed, use it as long as suggested by your surgeon or therapist, typically at least 4-6 weeks.  EXERCISES  Results after joint replacement surgery are often greatly improved when you follow the exercise, range of motion and muscle strengthening exercises prescribed by your doctor. Safety measures are also important to protect the joint from further injury. Any time any of these exercises cause you to have increased pain or swelling, decrease what you are doing until you are comfortable again and then slowly increase them. If you have problems or questions, call your caregiver or physical therapist for advice.   Rehabilitation is important following a joint replacement. After just a few days of immobilization, the muscles of the leg can become weakened and shrink (atrophy).  These exercises are designed to build up the tone and strength of the thigh and leg muscles and to improve motion. Often times heat used for twenty to thirty minutes before working out will loosen up your tissues and help with improving the range of motion but do not use heat for the first two weeks following surgery (sometimes heat can increase post-operative swelling).   These exercises can be done on a training (exercise) mat, on the floor, on a table or on a bed. Use whatever works the best and is most comfortable for you.    Use music or television while you are exercising so that the exercises are a pleasant break in your day. This will make your life  better with the exercises acting as a break in your routine that you can look forward to.   Perform all exercises about fifteen times, three times per day or as directed.  You should exercise both the operative leg and the other leg as well.  Exercises include:   Quad Sets - Tighten up the muscle on the front of the thigh (Quad) and hold for 5-10 seconds.   Straight Leg Raises - With your knee straight (if you were given a brace, keep it on), lift the leg to 60 degrees, hold  for 3 seconds, and slowly lower the leg.  Perform this exercise against resistance later as your leg gets stronger.  Leg Slides: Lying on your back, slowly slide your foot toward your buttocks, bending your knee up off the floor (only go as far as is comfortable). Then slowly slide your foot back down until your leg is flat on the floor again.  Angel Wings: Lying on your back spread your legs to the side as far apart as you can without causing discomfort.  Hamstring Strength:  Lying on your back, push your heel against the floor with your leg straight by tightening up the muscles of your buttocks.  Repeat, but this time bend your knee to a comfortable angle, and push your heel against the floor.  You may put a pillow under the heel to make it more comfortable if necessary.   A rehabilitation program following joint replacement surgery can speed recovery and prevent re-injury in the future due to weakened muscles. Contact your doctor or a physical therapist for more information on knee rehabilitation.   CONSTIPATION:  Constipation is defined medically as fewer than three stools per week and severe constipation as less than one stool per week.  Even if you have a regular bowel pattern at home, your normal regimen is likely to be disrupted due to multiple reasons following surgery.  Combination of anesthesia, postoperative narcotics, change in appetite and fluid intake all can affect your bowels.   YOU MUST use at least one of the following options; they are listed in order of increasing strength to get the job done.  They are all available over the counter, and you may need to use some, POSSIBLY even all of these options:    Drink plenty of fluids (prune juice may be helpful) and high fiber foods Colace 100 mg by mouth twice a day  Senokot for constipation as directed and as needed Dulcolax (bisacodyl), take with full glass of water  Miralax (polyethylene glycol) once or twice a day as needed.  If you  have tried all these things and are unable to have a bowel movement in the first 3-4 days after surgery call either your surgeon or your primary doctor.    If you experience loose stools or diarrhea, hold the medications until you stool forms back up.  If your symptoms do not get better within 1 week or if they get worse, check with your doctor.  If you experience "the worst abdominal pain ever" or develop nausea or vomiting, please contact the office immediately for further recommendations for treatment.  ITCHING:  If you experience itching with your medications, try taking only a single pain pill, or even half a pain pill at a time.  You can also use Benadryl over the counter for itching or also to help with sleep.   TED HOSE STOCKINGS:  Use stockings on both legs until for at least 2 weeks or  as directed by physician office. They may be removed at night for sleeping.  MEDICATIONS:  See your medication summary on the "After Visit Summary" that nursing will review with you.  You may have some home medications which will be placed on hold until you complete the course of blood thinner medication.  It is important for you to complete the blood thinner medication as prescribed.  Blood clot prevention (DVT Prophylaxis): After surgery you are at an increased risk for a blood clot. you were prescribed a blood thinner, Aspirin 81mg , to be taken twice daily for a total of 4 weeks from surgery to help reduce your risk of getting a blood clot.  Signs of a pulmonary embolus (blood clot in the lungs) include sudden short of breath, feeling lightheaded or dizzy, chest pain with a deep breath, rapid pulse rapid breathing.  Signs of a blood clot in your arms or legs include new unexplained swelling and cramping, warm, red or darkened skin around the painful area.  Please call the office or 911 right away if these signs or symptoms develop.  PRECAUTIONS:   If you experience chest pain or shortness of breath - call 911  immediately for transfer to the hospital emergency department.   If you develop a fever greater that 101 F, purulent drainage from wound, increased redness or drainage from wound, foul odor from the wound/dressing, or calf pain - CONTACT YOUR SURGEON.                                                   FOLLOW-UP APPOINTMENTS:  If you do not already have a post-op appointment, please call the office for an appointment to be seen by your surgeon.  Guidelines for how soon to be seen are listed in your "After Visit Summary", but are typically between 2-3 weeks after surgery.  If you have a specialized bandage, you may be told to follow up 1 week after surgery.  OTHER INSTRUCTIONS:  Knee Replacement:  Do not place pillow under knee, focus on keeping the knee straight while resting.  Place foam block, curve side up under heel at all times except when walking.  DO NOT modify, tear, cut, or change the foam block in any way.  POST-OPERATIVE OPIOID TAPER INSTRUCTIONS: It is important to wean off of your opioid medication as soon as possible. If you do not need pain medication after your surgery it is ok to stop day one. Opioids include: Codeine, Hydrocodone(Norco, Vicodin), Oxycodone(Percocet, oxycontin) and hydromorphone amongst others.  Long term and even short term use of opiods can cause: Increased pain response Dependence Constipation Depression Respiratory depression And more.  Withdrawal symptoms can include Flu like symptoms Nausea, vomiting And more Techniques to manage these symptoms Hydrate well Eat regular healthy meals Stay active Use relaxation techniques(deep breathing, meditating, yoga) Do Not substitute Alcohol to help with tapering If you have been on opioids for less than two weeks and do not have pain than it is ok to stop all together.  Plan to wean off of opioids This plan should start within one week post op of your joint replacement. Maintain the same interval or time  between taking each dose and first decrease the dose.  Cut the total daily intake of opioids by one tablet each day Next start to increase the time between doses.  The last dose that should be eliminated is the evening dose.   MAKE SURE YOU:  Understand these instructions.  Get help right away if you are not doing well or get worse.    Thank you for letting us be a part of your medical care team.  It is a privilege we respect greatly.  We hope these instructions will help you stay on track for a fast and full recovery!

## 2023-06-24 NOTE — Progress Notes (Signed)
Orthopedic Tech Progress Note Patient Details:  Antoniya Vorndran [facility] 14-Jan-1956 [id number]  Ortho Devices Type of Ortho Device: Bone foam zero knee Ortho Device/Splint Location: left Ortho Device/Splint Interventions: Ordered, Application, Adjustment   Post Interventions Patient Tolerated: Well Instructions Provided: Adjustment of device, Care of device  Kizzie Fantasia 06/24/2023, 3:01 PM

## 2023-06-25 ENCOUNTER — Other Ambulatory Visit (HOSPITAL_COMMUNITY): Payer: Self-pay

## 2023-06-25 ENCOUNTER — Encounter (HOSPITAL_COMMUNITY): Payer: Self-pay | Admitting: Orthopedic Surgery

## 2023-06-25 DIAGNOSIS — F17210 Nicotine dependence, cigarettes, uncomplicated: Secondary | ICD-10-CM | POA: Diagnosis not present

## 2023-06-25 DIAGNOSIS — Z79899 Other long term (current) drug therapy: Secondary | ICD-10-CM | POA: Diagnosis not present

## 2023-06-25 DIAGNOSIS — M21062 Valgus deformity, not elsewhere classified, left knee: Secondary | ICD-10-CM | POA: Diagnosis not present

## 2023-06-25 DIAGNOSIS — M1712 Unilateral primary osteoarthritis, left knee: Secondary | ICD-10-CM | POA: Diagnosis not present

## 2023-06-25 DIAGNOSIS — N1832 Chronic kidney disease, stage 3b: Secondary | ICD-10-CM | POA: Diagnosis not present

## 2023-06-25 DIAGNOSIS — Z96652 Presence of left artificial knee joint: Secondary | ICD-10-CM | POA: Diagnosis not present

## 2023-06-25 DIAGNOSIS — J449 Chronic obstructive pulmonary disease, unspecified: Secondary | ICD-10-CM | POA: Diagnosis not present

## 2023-06-25 DIAGNOSIS — I129 Hypertensive chronic kidney disease with stage 1 through stage 4 chronic kidney disease, or unspecified chronic kidney disease: Secondary | ICD-10-CM | POA: Diagnosis not present

## 2023-06-25 LAB — CBC
HCT: 22.3 % — ABNORMAL LOW (ref 36.0–46.0)
Hemoglobin: 7.5 g/dL — ABNORMAL LOW (ref 12.0–15.0)
MCH: 33.5 pg (ref 26.0–34.0)
MCHC: 33.6 g/dL (ref 30.0–36.0)
MCV: 99.6 fL (ref 80.0–100.0)
Platelets: 203 10*3/uL (ref 150–400)
RBC: 2.24 MIL/uL — ABNORMAL LOW (ref 3.87–5.11)
RDW: 13.9 % (ref 11.5–15.5)
WBC: 13.1 10*3/uL — ABNORMAL HIGH (ref 4.0–10.5)
nRBC: 0 % (ref 0.0–0.2)

## 2023-06-25 LAB — BASIC METABOLIC PANEL
Anion gap: 9 (ref 5–15)
BUN: 18 mg/dL (ref 8–23)
CO2: 21 mmol/L — ABNORMAL LOW (ref 22–32)
Calcium: 10 mg/dL (ref 8.9–10.3)
Chloride: 104 mmol/L (ref 98–111)
Creatinine, Ser: 1.08 mg/dL — ABNORMAL HIGH (ref 0.44–1.00)
GFR, Estimated: 56 mL/min — ABNORMAL LOW (ref >60)
Glucose, Bld: 118 mg/dL — ABNORMAL HIGH (ref 70–99)
Potassium: 4.2 mmol/L (ref 3.5–5.1)
Sodium: 134 mmol/L — ABNORMAL LOW (ref 135–145)

## 2023-06-25 MED ORDER — OXYCODONE HCL 5 MG PO TABS
5.0000 mg | ORAL_TABLET | ORAL | 0 refills | Status: AC | PRN
  Filled 2023-06-25: qty 30, 5d supply, fill #0

## 2023-06-25 MED ORDER — ONDANSETRON HCL 4 MG PO TABS
4.0000 mg | ORAL_TABLET | Freq: Three times a day (TID) | ORAL | 0 refills | Status: AC | PRN
  Filled 2023-06-25: qty 14, 5d supply, fill #0

## 2023-06-25 MED ORDER — ACETAMINOPHEN 500 MG PO TABS: 1000.0000 mg | ORAL_TABLET | Freq: Three times a day (TID) | ORAL | Status: AC | PRN

## 2023-06-25 MED ORDER — POLYETHYLENE GLYCOL 3350 17 GM/SCOOP PO POWD
17.0000 g | Freq: Every day | ORAL | 0 refills | Status: DC
  Filled 2023-06-25: qty 238, 14d supply, fill #0

## 2023-06-25 MED ORDER — ASPIRIN 81 MG PO TBEC: 81.0000 mg | DELAYED_RELEASE_TABLET | Freq: Two times a day (BID) | ORAL | Status: AC

## 2023-06-25 MED ORDER — METHOCARBAMOL 500 MG PO TABS
500.0000 mg | ORAL_TABLET | Freq: Three times a day (TID) | ORAL | 0 refills | Status: AC | PRN
  Filled 2023-06-25: qty 30, 10d supply, fill #0

## 2023-06-25 NOTE — Anesthesia Postprocedure Evaluation (Signed)
Anesthesia Post Note  Patient: [name]  Procedure(s) Performed: TOTAL KNEE ARTHROPLASTY (Left: Knee)     Patient location during evaluation: Nursing Unit Anesthesia Type: Regional and Spinal Level of consciousness: oriented and awake and alert Pain management: pain level controlled Vital Signs Assessment: post-procedure vital signs reviewed and stable Respiratory status: spontaneous breathing and respiratory function stable Cardiovascular status: blood pressure returned to baseline and stable Postop Assessment: no headache, no backache, no apparent nausea or vomiting and patient able to bend at knees Anesthetic complications: no   No notable events documented.  Last Vitals:  Vitals:   06/25/23 0912 06/25/23 0949  BP:  137/64  Pulse:  80  Resp:  18  Temp:  37.1 C  SpO2: 98% 100%    Last Pain:  Vitals:   06/25/23 1350  TempSrc:   PainSc: 6    Pain Goal: Patients Stated Pain Goal: 0 (06/25/23 1350)                 Anna Mcguire

## 2023-06-25 NOTE — Progress Notes (Signed)
Physical Therapy Treatment Patient Details Name: Anna Mcguire MRN: [id number] DOB: 10/07/1955 Today's Date: 06/25/2023   History of Present Illness Pt is a 67 year old female s/p Lt TKA on 06/24/23. PMHx: COPD, arthritis, HTN    PT Comments  Pt ambulated in hallway and practiced steps.  Pt provided with HEP handout and plans to f/u with OPPT.  Pt had no further questions and feels ready for d/c home today.     If plan is discharge home, recommend the following: A little help with walking and/or transfers;A little help with bathing/dressing/bathroom   Can travel by private vehicle        Equipment Recommendations  Rolling walker (2 wheels)    Recommendations for Other Services       Precautions / Restrictions Precautions Precautions: Fall;Knee Restrictions Weight Bearing Restrictions Per Provider Order: No Other Position/Activity Restrictions: WBAT     Mobility  Bed Mobility Overal bed mobility: Modified Independent                  Transfers Overall transfer level: Needs assistance Equipment used: Rolling walker (2 wheels) Transfers: Sit to/from Stand Sit to Stand: Contact guard assist, Supervision           General transfer comment: verbal cues for UE and LE positioning for pain control    Ambulation/Gait Ambulation/Gait assistance: Contact guard assist, Supervision Gait Distance (Feet): 100 Feet Assistive device: Rolling walker (2 wheels) Gait Pattern/deviations: Step-to pattern, Decreased stance time - left, Antalgic Gait velocity: decr     General Gait Details: verbal cues for sequence, RW positioning, step length, posture   Stairs Stairs: Yes Stairs assistance: Contact guard assist Stair Management: Step to pattern, Forwards, Two rails Number of Stairs: 2 General stair comments: verbal cues for sequence and safety; pt reports understanding   Wheelchair Mobility     Tilt Bed    Modified Rankin (Stroke Patients Only)        Balance                                            Cognition Arousal: Alert Behavior During Therapy: WFL for tasks assessed/performed Overall Cognitive Status: Within Functional Limits for tasks assessed                                          Exercises     General Comments        Pertinent Vitals/Pain Pain Assessment Pain Assessment: 0-10 Pain Score: 8  Pain Location: left knee Pain Descriptors / Indicators: Aching, Sore Pain Intervention(s): Monitored during session, Repositioned, Patient requesting pain meds-RN notified, Ice applied    Home Living Family/patient expects to be discharged to:: Private residence Living Arrangements: Alone Available Help at Discharge: Family (daughter and sister) Type of Home: Apartment Home Access: Stairs to enter Entrance Stairs-Rails: Right;Left;Can reach both Secretary/administrator of Steps: 5   Home Layout: One level Home Equipment: Rollator (4 wheels)      Prior Function            PT Goals (current goals can now be found in the care plan section) Acute Rehab PT Goals PT Goal Formulation: With patient Time For Goal Achievement: 07/02/23 Potential to Achieve Goals: Good Progress towards PT goals: Progressing toward goals  Frequency    7X/week      PT Plan      Co-evaluation              AM-PAC PT "6 Clicks" Mobility   Outcome Measure  Help needed turning from your back to your side while in a flat bed without using bedrails?: A Little Help needed moving from lying on your back to sitting on the side of a flat bed without using bedrails?: A Little Help needed moving to and from a bed to a chair (including a wheelchair)?: A Little Help needed standing up from a chair using your arms (e.g., wheelchair or bedside chair)?: A Little Help needed to walk in hospital room?: A Little Help needed climbing 3-5 steps with a railing? : A Little 6 Click Score: 18    End  of Session Equipment Utilized During Treatment: Gait belt Activity Tolerance: Patient tolerated treatment well Patient left: in bed;with call bell/phone within reach;with bed alarm set;with family/visitor present Nurse Communication: Mobility status;Patient requests pain meds PT Visit Diagnosis: Difficulty in walking, not elsewhere classified (R26.2)     Time: 1610-9604 PT Time Calculation (min) (ACUTE ONLY): 18 min  Charges:    $Gait Training: 8-22 mins  PT General Charges $$ ACUTE PT VISIT: 1 Visit                     Paulino Door, DPT Physical Therapist Acute Rehabilitation Services Office: [phone]    Kati L Payson 06/25/2023, 3:30 PM

## 2023-06-25 NOTE — Plan of Care (Signed)
  Problem: Education: Goal: Knowledge of General Education information will improve Description: Including pain rating scale, medication(s)/side effects and non-pharmacologic comfort measures 06/25/2023 1457 by Amil Amen, RN Outcome: Adequate for Discharge 06/25/2023 1457 by Amil Amen, RN Outcome: Adequate for Discharge   Problem: Health Behavior/Discharge Planning: Goal: Ability to manage health-related needs will improve 06/25/2023 1457 by Amil Amen, RN Outcome: Adequate for Discharge 06/25/2023 1457 by Amil Amen, RN Outcome: Adequate for Discharge   Problem: Clinical Measurements: Goal: Ability to maintain clinical measurements within normal limits will improve 06/25/2023 1457 by Amil Amen, RN Outcome: Adequate for Discharge 06/25/2023 1457 by Amil Amen, RN Outcome: Adequate for Discharge Goal: Will remain free from infection 06/25/2023 1457 by Amil Amen, RN Outcome: Adequate for Discharge 06/25/2023 1457 by Amil Amen, RN Outcome: Adequate for Discharge Goal: Diagnostic test results will improve 06/25/2023 1457 by Amil Amen, RN Outcome: Adequate for Discharge 06/25/2023 1457 by Amil Amen, RN Outcome: Adequate for Discharge Goal: Respiratory complications will improve 06/25/2023 1457 by Amil Amen, RN Outcome: Adequate for Discharge 06/25/2023 1457 by Amil Amen, RN Outcome: Adequate for Discharge Goal: Cardiovascular complication will be avoided 06/25/2023 1457 by Amil Amen, RN Outcome: Adequate for Discharge 06/25/2023 1457 by Amil Amen, RN Outcome: Adequate for Discharge

## 2023-06-25 NOTE — TOC Transition Note (Signed)
Transition of Care [facility]) - Discharge Note  Patient Details  Name: Anna Mcguire MRN: [id number] Date of Birth: 1955/10/04  [facility] of [facility]) CM/SW Contact:  Ewing Schlein, LCSW Phone Number: 06/25/2023, 10:11 AM  Clinical Narrative: Patient is expected to discharge home after working with PT. CSW spoke with patient to confirm discharge plan and needs. Patient will go home with OPPT at [facility]. Patient reported she had a rolling walker at home that someone gave her, but it is worn out and no longer works. Per patient, she has not received a walker through insurance in the past 5 years and is agreeable to having [employer] deliver the walker to her room. CSW made DME referral to [facility] with [employer] and rolling walker was delivered to patient's room. TOC signing off.  Final next level of care: OP Rehab Barriers to Discharge: No Barriers Identified  Patient Goals and CMS Choice Patient states their goals for this hospitalization and ongoing recovery are:: Discharge home with OPPT at [facility] CMS Medicare.gov Compare Post Acute Care list provided to:: Patient Choice offered to / list presented to : Patient  Discharge Plan and Services Additional resources added to the After Visit Summary for          DME Arranged: Walker rolling DME Agency: [employer] Date DME Agency Contacted: 06/25/23 Representative spoke with at DME Agency: Loraine Leriche  Social Drivers of Health (SDOH) Interventions SDOH Screenings   Food Insecurity: No Food Insecurity (06/24/2023)  Housing: Low Risk  (06/24/2023)  Transportation Needs: No Transportation Needs (06/24/2023)  Utilities: Not At Risk (06/24/2023)  Alcohol Screen: Low Risk  (05/14/2023)  Depression (PHQ2-9): High Risk (05/14/2023)  Financial Resource Strain: Low Risk  (05/14/2023)  Physical Activity: Inactive (05/14/2023)  Social Connections: Moderately Integrated (05/14/2023)  Stress: No Stress Concern Present (05/14/2023)  Tobacco Use:  High Risk (06/24/2023)  Health Literacy: Inadequate Health Literacy (05/14/2023)   Readmission Risk Interventions     No data to display

## 2023-06-25 NOTE — Discharge Summary (Signed)
Physician Discharge Summary  Patient ID: Caelie Wadleigh MRN: [id number] DOB/AGE: [birth year] 69 y.o.  Admit date: 06/24/2023 Discharge date: 06/25/2023  Admission Diagnoses:  Primary osteoarthritis of left knee  Discharge Diagnoses:  Principal Problem:   Primary osteoarthritis of left knee   Past Medical History:  Diagnosis Date   Acid reflux    Anxiety    Arthritis    COPD (chronic obstructive pulmonary disease) (HCC)    Depression    Dyspnea    Hepatitis    Hypertension     Surgeries: Procedure(s): TOTAL KNEE ARTHROPLASTY on 06/24/2023   Consultants (if any):   Discharged Condition: Improved  Hospital Course: Nikaya Otterstrom is an 67 y.o. female who was admitted 06/24/2023 with a diagnosis of Primary osteoarthritis of left knee and went to the operating room on 06/24/2023 and underwent the above named procedures.    She was given perioperative antibiotics:  Anti-infectives (From admission, onward)    Start     Dose/Rate Route Frequency Ordered Stop   06/24/23 1800  ceFAZolin (ANCEF) IVPB 2g/100 mL premix        2 g 200 mL/hr over 30 Minutes Intravenous Every 6 hours 06/24/23 1644 06/25/23 0056   06/24/23 0900  ceFAZolin (ANCEF) IVPB 2g/100 mL premix        2 g 200 mL/hr over 30 Minutes Intravenous On call to O.R. 06/24/23 4098 06/24/23 1203     .  She was given sequential compression devices, early ambulation, and aspirin for DVT prophylaxis.  She benefited maximally from the hospital stay and there were no complications.    Recent vital signs:  Vitals:   06/25/23 0128 06/25/23 0449  BP: 127/71 129/75  Pulse: 79 78  Resp: 15 16  Temp: 99 F (37.2 C) 98.4 F (36.9 C)  SpO2: 100% 100%    Recent laboratory studies:  Lab Results  Component Value Date   HGB 10.2 (L) 06/23/2023   HGB 10.4 (L) 03/13/2023   HGB 9.6 (L) 12/05/2022   Lab Results  Component Value Date   WBC 4.8 06/23/2023   PLT 268 06/23/2023   No results found for:  "INR" Lab Results  Component Value Date   NA 138 06/23/2023   K 3.8 06/23/2023   CL 108 06/23/2023   CO2 23 06/23/2023   BUN 15 06/23/2023   CREATININE 0.79 06/23/2023   GLUCOSE 86 06/23/2023    Discharge Medications:   Allergies as of 06/25/2023       Reactions   Chantix [varenicline Tartrate]    Diarrhea and dizziness   Penicillins Diarrhea        Medication List     STOP taking these medications    traMADol 50 MG tablet Commonly known as: ULTRAM       TAKE these medications    acetaminophen 500 MG tablet Commonly known as: TYLENOL Take 2 tablets (1,000 mg total) by mouth every 8 (eight) hours as needed.   albuterol (2.5 MG/3ML) 0.083% nebulizer solution Commonly known as: PROVENTIL inhale THE contents of 1 vial PER nebulizer EVERY 6 HOURS AS NEEDED FOR wheezing OR SHORTNESS OF BREATH   albuterol 108 (90 Base) MCG/ACT inhaler Commonly known as: VENTOLIN HFA inhale 2 puffs BY MOUTH EVERY 6 HOURS AS NEEDED FOR wheezing OR shortness of breath   amLODipine 10 MG tablet Commonly known as: NORVASC TAKE 1 Tablet BY MOUTH ONCE DAILY FOR BLOOD PRESSURE   aspirin EC 81 MG tablet Take 1 tablet (81 mg total) by  mouth 2 (two) times daily for 28 days. Swallow whole.   atorvastatin 80 MG tablet Commonly known as: LIPITOR TAKE 1 Tablet BY MOUTH ONCE DAILY   diclofenac Sodium 1 % Gel Commonly known as: VOLTAREN Apply 2 g topically 4 (four) times daily.   Dulera 100-5 MCG/ACT Aero Generic drug: mometasone-formoterol INHALE 2 PUFFS BY MOUTH TWICE DAILY   ferrous sulfate 325 (65 FE) MG tablet Take 1 tablet (325 mg total) by mouth daily with breakfast. Patient to pay out of pocket if not covered by her insurance.   hydrALAZINE 10 MG tablet Commonly known as: APRESOLINE Take 1 tablet (10 mg total) by mouth 3 (three) times daily.   irbesartan 75 MG tablet Commonly known as: AVAPRO Take 1 tablet (75 mg total) by mouth daily.   methocarbamol 500 MG  tablet Commonly known as: ROBAXIN Take 1 tablet (500 mg total) by mouth every 8 (eight) hours as needed for up to 10 days for muscle spasms.   ondansetron 4 MG tablet Commonly known as: Zofran Take 1 tablet (4 mg total) by mouth every 8 (eight) hours as needed for up to 14 days for nausea or vomiting.   oxyCODONE 5 MG immediate release tablet Commonly known as: Roxicodone Take 1 tablet (5 mg total) by mouth every 4 (four) hours as needed for up to 7 days for severe pain (pain score 7-10) or moderate pain (pain score 4-6).   pantoprazole 40 MG tablet Commonly known as: PROTONIX Take 1 tablet (40 mg total) by mouth 2 (two) times daily.   polyethylene glycol 17 g packet Commonly known as: MiraLax Take 17 g by mouth daily.   sertraline 25 MG tablet Commonly known as: ZOLOFT Take 1 tablet (25 mg total) by mouth daily.   tiotropium 18 MCG inhalation capsule Commonly known as: Spiriva HandiHaler PLACE 1 CAPSULE INTO INHALER AND INHALE DAILY   Vitamin D (Cholecalciferol) 10 MCG (400 UNIT) Caps Take 400 Int'l Units by mouth daily.        Diagnostic Studies: DG Knee Left Port Result Date: 06/24/2023 CLINICAL DATA:  Postoperative state 8. EXAM: PORTABLE LEFT KNEE - 1-2 VIEW COMPARISON:  Left knee radiographs 04/23/2022 FINDINGS: Interval total left knee arthroplasty. No perihardware lucency is seen to indicate hardware failure or loosening. Postoperative changes of intra-articular air and subcutaneous air. Tiny joint effusion. No acute fracture or dislocation. IMPRESSION: Interval total left knee arthroplasty without evidence of hardware failure. Electronically Signed   By: Neita Garnet M.D.   On: 06/24/2023 16:19    Disposition: Discharge disposition: 01-Home or Self Care       Discharge Instructions     Call MD / Call 911   Complete by: As directed    If you experience chest pain or shortness of breath, CALL 911 and be transported to the hospital emergency room.  If you  develope a fever above 101 F, pus (white drainage) or increased drainage or redness at the wound, or calf pain, call your surgeon's office.   Constipation Prevention   Complete by: As directed    Drink plenty of fluids.  Prune juice may be helpful.  You may use a stool softener, such as Colace (over the counter) 100 mg twice a day.  Use MiraLax (over the counter) for constipation as needed.   Diet - low sodium heart healthy   Complete by: As directed    Do not put a pillow under the knee. Place it under the heel.   Complete by:  As directed    Increase activity slowly as tolerated   Complete by: As directed    Post-operative opioid taper instructions:   Complete by: As directed    POST-OPERATIVE OPIOID TAPER INSTRUCTIONS: It is important to wean off of your opioid medication as soon as possible. If you do not need pain medication after your surgery it is ok to stop day one. Opioids include: Codeine, Hydrocodone(Norco, Vicodin), Oxycodone(Percocet, oxycontin) and hydromorphone amongst others.  Long term and even short term use of opiods can cause: Increased pain response Dependence Constipation Depression Respiratory depression And more.  Withdrawal symptoms can include Flu like symptoms Nausea, vomiting And more Techniques to manage these symptoms Hydrate well Eat regular healthy meals Stay active Use relaxation techniques(deep breathing, meditating, yoga) Do Not substitute Alcohol to help with tapering If you have been on opioids for less than two weeks and do not have pain than it is ok to stop all together.  Plan to wean off of opioids This plan should start within one week post op of your joint replacement. Maintain the same interval or time between taking each dose and first decrease the dose.  Cut the total daily intake of opioids by one tablet each day Next start to increase the time between doses. The last dose that should be eliminated is the evening dose.            Follow-up Information     Joen Laura, MD. Go on 07/09/2023.   Specialty: Orthopedic Surgery Why: Your appointment is scheduled for 2:15. Contact information: [address] [place] [address] [phone]         Drinda Butts [facility] Go on 06/29/2023.   Why: Your outpaitent physical therapy has been scheduled.  They will call you with a time Contact information: [phone]                   Discharge Instructions      INSTRUCTIONS AFTER JOINT REPLACEMENT   Remove items at home which could result in a fall. This includes throw rugs or furniture in walking pathways ICE to the affected joint every three hours while awake for 30 minutes at a time, for at least the first 3-5 days, and then as needed for pain and swelling.  Continue to use ice for pain and swelling. You may notice swelling that will progress down to the foot and ankle.  This is normal after surgery.  Elevate your leg when you are not up walking on it.   Continue to use the breathing machine you got in the hospital (incentive spirometer) which will help keep your temperature down.  It is common for your temperature to cycle up and down following surgery, especially at night when you are not up moving around and exerting yourself.  The breathing machine keeps your lungs expanded and your temperature down.  DIET:  As you were doing prior to hospitalization, we recommend a well-balanced diet.  DRESSING / WOUND CARE / SHOWERING:  Keep the surgical dressing until follow up.  The dressing is water proof, so you can shower without any extra covering.  IF THE DRESSING FALLS OFF or the wound gets wet inside, change the dressing with sterile gauze.  Please use good hand washing techniques before changing the dressing.  Do not use any lotions or creams on the incision until instructed by your surgeon.    ACTIVITY  Increase activity slowly as tolerated, but follow the weight  bearing  instructions below.   No driving for 6 weeks or until further direction given by your physician.  You cannot drive while taking narcotics.  No lifting or carrying greater than 10 lbs. until further directed by your surgeon. Avoid periods of inactivity such as sitting longer than an hour when not asleep. This helps prevent blood clots.  You may return to work once you are authorized by your doctor.   WEIGHT BEARING: Weight bearing as tolerated with assist device (walker, cane, etc) as directed, use it as long as suggested by your surgeon or therapist, typically at least 4-6 weeks.  EXERCISES  Results after joint replacement surgery are often greatly improved when you follow the exercise, range of motion and muscle strengthening exercises prescribed by your doctor. Safety measures are also important to protect the joint from further injury. Any time any of these exercises cause you to have increased pain or swelling, decrease what you are doing until you are comfortable again and then slowly increase them. If you have problems or questions, call your caregiver or physical therapist for advice.   Rehabilitation is important following a joint replacement. After just a few days of immobilization, the muscles of the leg can become weakened and shrink (atrophy).  These exercises are designed to build up the tone and strength of the thigh and leg muscles and to improve motion. Often times heat used for twenty to thirty minutes before working out will loosen up your tissues and help with improving the range of motion but do not use heat for the first two weeks following surgery (sometimes heat can increase post-operative swelling).   These exercises can be done on a training (exercise) mat, on the floor, on a table or on a bed. Use whatever works the best and is most comfortable for you.    Use music or television while you are exercising so that the exercises are a pleasant break in your day. This will make  your life better with the exercises acting as a break in your routine that you can look forward to.   Perform all exercises about fifteen times, three times per day or as directed.  You should exercise both the operative leg and the other leg as well.  Exercises include:   Quad Sets - Tighten up the muscle on the front of the thigh (Quad) and hold for 5-10 seconds.   Straight Leg Raises - With your knee straight (if you were given a brace, keep it on), lift the leg to 60 degrees, hold for 3 seconds, and slowly lower the leg.  Perform this exercise against resistance later as your leg gets stronger.  Leg Slides: Lying on your back, slowly slide your foot toward your buttocks, bending your knee up off the floor (only go as far as is comfortable). Then slowly slide your foot back down until your leg is flat on the floor again.  Angel Wings: Lying on your back spread your legs to the side as far apart as you can without causing discomfort.  Hamstring Strength:  Lying on your back, push your heel against the floor with your leg straight by tightening up the muscles of your buttocks.  Repeat, but this time bend your knee to a comfortable angle, and push your heel against the floor.  You may put a pillow under the heel to make it more comfortable if necessary.   A rehabilitation program following joint replacement surgery can speed recovery and prevent re-injury in  the future due to weakened muscles. Contact your doctor or a physical therapist for more information on knee rehabilitation.   CONSTIPATION:  Constipation is defined medically as fewer than three stools per week and severe constipation as less than one stool per week.  Even if you have a regular bowel pattern at home, your normal regimen is likely to be disrupted due to multiple reasons following surgery.  Combination of anesthesia, postoperative narcotics, change in appetite and fluid intake all can affect your bowels.   YOU MUST use at least one  of the following options; they are listed in order of increasing strength to get the job done.  They are all available over the counter, and you may need to use some, POSSIBLY even all of these options:    Drink plenty of fluids (prune juice may be helpful) and high fiber foods Colace 100 mg by mouth twice a day  Senokot for constipation as directed and as needed Dulcolax (bisacodyl), take with full glass of water  Miralax (polyethylene glycol) once or twice a day as needed.  If you have tried all these things and are unable to have a bowel movement in the first 3-4 days after surgery call either your surgeon or your primary doctor.    If you experience loose stools or diarrhea, hold the medications until you stool forms back up.  If your symptoms do not get better within 1 week or if they get worse, check with your doctor.  If you experience "the worst abdominal pain ever" or develop nausea or vomiting, please contact the office immediately for further recommendations for treatment.  ITCHING:  If you experience itching with your medications, try taking only a single pain pill, or even half a pain pill at a time.  You can also use Benadryl over the counter for itching or also to help with sleep.   TED HOSE STOCKINGS:  Use stockings on both legs until for at least 2 weeks or as directed by physician office. They may be removed at night for sleeping.  MEDICATIONS:  See your medication summary on the "After Visit Summary" that nursing will review with you.  You may have some home medications which will be placed on hold until you complete the course of blood thinner medication.  It is important for you to complete the blood thinner medication as prescribed.  Blood clot prevention (DVT Prophylaxis): After surgery you are at an increased risk for a blood clot. you were prescribed a blood thinner, Aspirin 81mg , to be taken twice daily for a total of 4 weeks from surgery to help reduce your risk of getting  a blood clot.  Signs of a pulmonary embolus (blood clot in the lungs) include sudden short of breath, feeling lightheaded or dizzy, chest pain with a deep breath, rapid pulse rapid breathing.  Signs of a blood clot in your arms or legs include new unexplained swelling and cramping, warm, red or darkened skin around the painful area.  Please call the office or 911 right away if these signs or symptoms develop.  PRECAUTIONS:   If you experience chest pain or shortness of breath - call 911 immediately for transfer to the hospital emergency department.   If you develop a fever greater that 101 F, purulent drainage from wound, increased redness or drainage from wound, foul odor from the wound/dressing, or calf pain - CONTACT YOUR SURGEON.  FOLLOW-UP APPOINTMENTS:  If you do not already have a post-op appointment, please call the office for an appointment to be seen by your surgeon.  Guidelines for how soon to be seen are listed in your "After Visit Summary", but are typically between 2-3 weeks after surgery.  If you have a specialized bandage, you may be told to follow up 1 week after surgery.  OTHER INSTRUCTIONS:  Knee Replacement:  Do not place pillow under knee, focus on keeping the knee straight while resting.  Place foam block, curve side up under heel at all times except when walking.  DO NOT modify, tear, cut, or change the foam block in any way.  POST-OPERATIVE OPIOID TAPER INSTRUCTIONS: It is important to wean off of your opioid medication as soon as possible. If you do not need pain medication after your surgery it is ok to stop day one. Opioids include: Codeine, Hydrocodone(Norco, Vicodin), Oxycodone(Percocet, oxycontin) and hydromorphone amongst others.  Long term and even short term use of opiods can cause: Increased pain response Dependence Constipation Depression Respiratory depression And more.  Withdrawal symptoms can include Flu like  symptoms Nausea, vomiting And more Techniques to manage these symptoms Hydrate well Eat regular healthy meals Stay active Use relaxation techniques(deep breathing, meditating, yoga) Do Not substitute Alcohol to help with tapering If you have been on opioids for less than two weeks and do not have pain than it is ok to stop all together.  Plan to wean off of opioids This plan should start within one week post op of your joint replacement. Maintain the same interval or time between taking each dose and first decrease the dose.  Cut the total daily intake of opioids by one tablet each day Next start to increase the time between doses. The last dose that should be eliminated is the evening dose.   MAKE SURE YOU:  Understand these instructions.  Get help right away if you are not doing well or get worse.    Thank you for letting us be a part of your medical care team.  It is a privilege we respect greatly.  We hope these instructions will help you stay on track for a fast and full recovery!            Signed: Matisse Salais A Tashawnda Bleiler 06/25/2023, 6:21 AM

## 2023-06-25 NOTE — Progress Notes (Signed)
Patient was given DC instructions and medications were brought to room prior to DC. Pt left with rolling walker and taken to main entrance via wheelchair.

## 2023-06-25 NOTE — Progress Notes (Signed)
     Subjective:  Patient reports pain as mild.  Has not yet worked with PT. Denies distal n/t. Hopeful for discharge home today.  Objective:   VITALS:   Vitals:   06/24/23 1923 06/24/23 2103 06/25/23 0128 06/25/23 0449  BP: (!) 146/86 (!) 143/86 127/71 129/75  Pulse: 78 79 79 78  Resp: 16 16 15 16   Temp: 98.7 F (37.1 C) 98.7 F (37.1 C) 99 F (37.2 C) 98.4 F (36.9 C)  TempSrc: Oral Oral Oral Oral  SpO2: 100% 100% 100% 100%  Weight:      Height:        Sensation intact distally Intact pulses distally Dorsiflexion/Plantar flexion intact Incision: dressing C/D/I Compartment soft    Lab Results  Component Value Date   WBC 4.8 06/23/2023   HGB 10.2 (L) 06/23/2023   HCT 32.6 (L) 06/23/2023   MCV 101.2 (H) 06/23/2023   PLT 268 06/23/2023   BMET    Component Value Date/Time   NA 138 06/23/2023 1202   NA 136 03/13/2023 1055   K 3.8 06/23/2023 1202   CL 108 06/23/2023 1202   CO2 23 06/23/2023 1202   GLUCOSE 86 06/23/2023 1202   BUN 15 06/23/2023 1202   BUN 20 03/13/2023 1055   CREATININE 0.79 06/23/2023 1202   CALCIUM 10.5 (H) 06/23/2023 1202   EGFR 54 (L) 03/13/2023 1055   GFRNONAA >60 06/23/2023 1202      Xray: TKA components in good position no adverse features  Assessment/Plan: 1 Day Post-Op   Principal Problem:   Primary osteoarthritis of left knee  S/p L TKA 06/24/23  Post op recs: WB: WBAT Abx: ancef Imaging: PACU xrays DVT prophylaxis: Aspirin 81mg  BID x4 weeks Follow up: 2 weeks after surgery for a wound check with Dr. Blanchie Dessert at [facility].  Address: [address], [place], [place] [address]  Office Phone: [phone]   Joen Laura 06/25/2023, 6:17 AM   Weber Cooks, MD  Contact information:   [phone] 7am-5pm epic message Dr. Blanchie Dessert, or call office for patient follow up: [phone] After hours and holidays please check [email or web address] for group call information for [facility]

## 2023-06-25 NOTE — Evaluation (Signed)
Physical Therapy Evaluation Patient Details Name: Anna Mcguire MRN: [id number] DOB: 1956-03-16 Today's Date: 06/25/2023  History of Present Illness  Pt is a 67 year old female s/p Lt TKA on 06/24/23. PMHx: COPD, arthritis, HTN  Clinical Impression  Pt is s/p TKA resulting in the deficits listed below (see PT Problem List).  Pt will benefit from acute skilled PT to increase their independence and safety with mobility to allow discharge.  Pt ambulated in hallway and performed LE exercises.  Pt plans to return home upon d/c.  Family member (sister per RN) present in room this morning and observed tension and arguing between pt and sister throughout session.  Pt likely to d/c home later today after practicing steps.         If plan is discharge home, recommend the following: A little help with walking and/or transfers;A little help with bathing/dressing/bathroom   Can travel by private vehicle        Equipment Recommendations Rolling walker (2 wheels)  Recommendations for Other Services       Functional Status Assessment Patient has had a recent decline in their functional status and demonstrates the ability to make significant improvements in function in a reasonable and predictable amount of time.     Precautions / Restrictions Precautions Precautions: Fall;Knee Restrictions Weight Bearing Restrictions Per Provider Order: No Other Position/Activity Restrictions: WBAT      Mobility  Bed Mobility Overal bed mobility: Modified Independent                  Transfers Overall transfer level: Needs assistance Equipment used: Rolling walker (2 wheels) Transfers: Sit to/from Stand Sit to Stand: Contact guard assist           General transfer comment: verbal cues for UE and LE positioning for pain control    Ambulation/Gait Ambulation/Gait assistance: Contact guard assist Gait Distance (Feet): 80 Feet Assistive device: Rolling walker (2 wheels) Gait  Pattern/deviations: Step-to pattern, Decreased stance time - left, Antalgic Gait velocity: decr     General Gait Details: verbal cues for sequence, RW positioning, step length, posture  Stairs            Wheelchair Mobility     Tilt Bed    Modified Rankin (Stroke Patients Only)       Balance                                             Pertinent Vitals/Pain Pain Assessment Pain Assessment: 0-10 Pain Score: 6  Pain Location: left knee Pain Descriptors / Indicators: Aching, Sore Pain Intervention(s): Monitored during session, Premedicated before session, Repositioned    Home Living Family/patient expects to be discharged to:: Private residence Living Arrangements: Alone Available Help at Discharge: Family Type of Home: House Home Access: Stairs to enter   Secretary/administrator of Steps: 5   Home Layout: One level Home Equipment: Rollator (4 wheels)      Prior Function Prior Level of Function : Independent/Modified Independent                     Extremity/Trunk Assessment        Lower Extremity Assessment Lower Extremity Assessment: LLE deficits/detail LLE Deficits / Details: 4-90* AAROM left knee       Communication   Communication Communication: No apparent difficulties  Cognition Arousal: Alert Behavior During Therapy:  WFL for tasks assessed/performed Overall Cognitive Status: Within Functional Limits for tasks assessed                                          General Comments      Exercises Total Joint Exercises Ankle Circles/Pumps: AROM, Both, 10 reps Quad Sets: AROM, Both, 10 reps Heel Slides: 10 reps, Both, AAROM, Left Hip ABduction/ADduction: AROM, Left, 10 reps Straight Leg Raises: AAROM, Left, 10 reps   Assessment/Plan    PT Assessment Patient needs continued PT services  PT Problem List Decreased mobility;Decreased activity tolerance;Decreased strength;Decreased range of  motion;Pain;Decreased knowledge of use of DME       PT Treatment Interventions Stair training;Gait training;DME instruction;Balance training;Functional mobility training;Therapeutic activities;Therapeutic exercise;Patient/family education    PT Goals (Current goals can be found in the Care Plan section)  Acute Rehab PT Goals PT Goal Formulation: With patient Time For Goal Achievement: 07/02/23 Potential to Achieve Goals: Good    Frequency 7X/week     Co-evaluation               AM-PAC PT "6 Clicks" Mobility  Outcome Measure Help needed turning from your back to your side while in a flat bed without using bedrails?: A Little Help needed moving from lying on your back to sitting on the side of a flat bed without using bedrails?: A Little Help needed moving to and from a bed to a chair (including a wheelchair)?: A Little Help needed standing up from a chair using your arms (e.g., wheelchair or bedside chair)?: A Little Help needed to walk in hospital room?: A Little Help needed climbing 3-5 steps with a railing? : A Little 6 Click Score: 18    End of Session Equipment Utilized During Treatment: Gait belt Activity Tolerance: Patient tolerated treatment well Patient left: in chair;with call bell/phone within reach;with family/visitor present (pt agreeable to use call bell for assist) Nurse Communication: Mobility status PT Visit Diagnosis: Difficulty in walking, not elsewhere classified (R26.2)    Time: 1610-9604 PT Time Calculation (min) (ACUTE ONLY): 26 min   Charges:   PT Evaluation $PT Eval Low Complexity: 1 Low PT Treatments $Therapeutic Exercise: 8-22 mins PT General Charges $$ ACUTE PT VISIT: 1 Visit        Paulino Door, DPT Physical Therapist Acute Rehabilitation Services Office: [phone]   Janan Halter P[place] 06/25/2023, 12:31 PM

## 2023-06-25 NOTE — Plan of Care (Signed)
  Problem: Education: Goal: Knowledge of General Education information will improve Description: Including pain rating scale, medication(s)/side effects and non-pharmacologic comfort measures 06/25/2023 1458 by Amil Amen, RN Outcome: Adequate for Discharge 06/25/2023 1457 by Amil Amen, RN Outcome: Adequate for Discharge   Problem: Health Behavior/Discharge Planning: Goal: Ability to manage health-related needs will improve 06/25/2023 1458 by Amil Amen, RN Outcome: Adequate for Discharge 06/25/2023 1457 by Amil Amen, RN Outcome: Adequate for Discharge   Problem: Clinical Measurements: Goal: Ability to maintain clinical measurements within normal limits will improve 06/25/2023 1458 by Amil Amen, RN Outcome: Adequate for Discharge 06/25/2023 1457 by Amil Amen, RN Outcome: Adequate for Discharge Goal: Will remain free from infection 06/25/2023 1458 by Amil Amen, RN Outcome: Adequate for Discharge 06/25/2023 1457 by Amil Amen, RN Outcome: Adequate for Discharge Goal: Diagnostic test results will improve 06/25/2023 1458 by Amil Amen, RN Outcome: Adequate for Discharge 06/25/2023 1457 by Amil Amen, RN Outcome: Adequate for Discharge Goal: Respiratory complications will improve 06/25/2023 1458 by Amil Amen, RN Outcome: Adequate for Discharge 06/25/2023 1457 by Amil Amen, RN Outcome: Adequate for Discharge Goal: Cardiovascular complication will be avoided 06/25/2023 1458 by Amil Amen, RN Outcome: Adequate for Discharge 06/25/2023 1457 by Amil Amen, RN Outcome: Adequate for Discharge   Problem: Coping: Goal: Level of anxiety will decrease 06/25/2023 1458 by Amil Amen, RN Outcome: Adequate for Discharge 06/25/2023 1457 by Amil Amen, RN Outcome: Adequate for Discharge   Problem: Nutrition: Goal: Adequate nutrition will be maintained 06/25/2023 1458 by Amil Amen, RN Outcome: Adequate for Discharge 06/25/2023 1457 by Amil Amen, RN Outcome: Adequate for Discharge   Problem: Elimination: Goal: Will not experience complications related to bowel motility 06/25/2023 1458 by Amil Amen, RN Outcome: Adequate for Discharge 06/25/2023 1457 by Amil Amen, RN Outcome: Adequate for Discharge Goal: Will not experience complications related to urinary retention 06/25/2023 1458 by Amil Amen, RN Outcome: Adequate for Discharge 06/25/2023 1457 by Amil Amen, RN Outcome: Adequate for Discharge

## 2023-06-26 ENCOUNTER — Telehealth: Payer: Self-pay

## 2023-06-26 NOTE — Telephone Encounter (Addendum)
Spoke with patient during [facility] call. Patient reported that she was sent home with nebulizer solution and she does not have the machine. After review of patient chart it is noted that on 05/14/2023 PCP placed an order for neb Machine.  Call placed to [facility] spoke with [employer] and was advised that according to their documentation , patient received Neb machine along with supplies in October of 2023/ Spoke with patient. Advised that her insurance will only cover a neb machine every 5 years so This is why a new one has not been sent out to her. Patient voiced that she never received a neb machine. Advised that she call her insurance company and discuss this with them. If the insurance company has not cover a nebulizer then she should be able to get that will be covered by her insurance. If she finds that a new machine not covered by her insurance advised there nebulizer machine are available to order online. Advised that if she needs assistance on how to order neb machine call back and I would assist her.

## 2023-06-26 NOTE — Transitions of Care (Post Inpatient/ED Visit) (Signed)
   06/26/2023  Name: Anna Mcguire MRN: [id number] DOB: 1955-11-28  Today's TOC FU Call Status: Today's TOC FU Call Status:: Successful TOC FU Call Completed TOC FU Call Complete Date: 06/26/23 Patient's Name and Date of Birth confirmed.  Transition Care Management Follow-up Telephone Call Date of Discharge: 06/25/23 Discharge Facility: [facility]) How have you been since you were released from the hospital?: Better Any questions or concerns?: Yes Patient Questions/Concerns:: patient reports she needs a nebulizer Patient Questions/Concerns Addressed: Other:  Items Reviewed: Did you receive and understand the discharge instructions provided?: Yes Medications obtained,verified, and reconciled?: Yes (Medications Reviewed) Any new allergies since your discharge?: No Dietary orders reviewed?: Yes Type of Diet Ordered:: low sodium heart healthy Do you have support at home?: Yes People in Home: child(ren), adult Name of Support/Comfort Primary Source: Owens,[name] R  Medications Reviewed Today: Medications Reviewed Today   Medications were not reviewed in this encounter     Home Care and Equipment/Supplies: Were Home Health Services Ordered?: No Any new equipment or medical supplies ordered?: Yes Name of Medical supply agency?: [facility] Were you able to get the equipment/medical supplies?: Yes Do you have any questions related to the use of the equipment/supplies?: No  Functional Questionnaire: Do you need assistance with bathing/showering or dressing?: Yes Do you need assistance with meal preparation?: Yes Do you need assistance with eating?: No Do you have difficulty maintaining continence: No Do you need assistance with getting out of bed/getting out of a chair/moving?: No Do you have difficulty managing or taking your medications?: No  Follow up appointments reviewed: PCP Follow-up appointment confirmed?: Yes Date of PCP follow-up appointment?:  07/28/23 Follow-up Provider: Dr. Laural Benes [facility] Follow-up appointment confirmed?: Yes Date of Specialist follow-up appointment?: 07/09/23 Follow-Up Specialty Provider:: DANIEL A MARCHWIANY Do you need transportation to your follow-up appointment?: No Do you understand care options if your condition(s) worsen?: Yes-patient verbalized understanding    SIGNATURE Elsie Lincoln, RN Triage Nurse [facility]

## 2023-06-26 NOTE — Transitions of Care (Post Inpatient/ED Visit) (Signed)
   06/26/2023  Name: Anna Mcguire MRN: [id number] DOB: 12/12/1955  Today's TOC FU Call Status:    Attempted to reach the patient regarding the most recent Inpatient/ED visit.  Follow Up Plan: Additional outreach attempts will be made to reach the patient to complete the Transitions of Care (Post Inpatient/ED visit) call.   Signature  Elsie Lincoln, RN

## 2023-06-29 ENCOUNTER — Other Ambulatory Visit: Payer: Self-pay

## 2023-06-29 ENCOUNTER — Ambulatory Visit: Payer: 59 | Attending: Orthopedic Surgery

## 2023-06-29 ENCOUNTER — Ambulatory Visit: Payer: 59

## 2023-06-29 DIAGNOSIS — R600 Localized edema: Secondary | ICD-10-CM | POA: Insufficient documentation

## 2023-06-29 DIAGNOSIS — M25562 Pain in left knee: Secondary | ICD-10-CM | POA: Diagnosis not present

## 2023-06-29 DIAGNOSIS — R262 Difficulty in walking, not elsewhere classified: Secondary | ICD-10-CM | POA: Diagnosis not present

## 2023-06-29 DIAGNOSIS — G8929 Other chronic pain: Secondary | ICD-10-CM | POA: Insufficient documentation

## 2023-06-29 NOTE — Therapy (Signed)
OUTPATIENT PHYSICAL THERAPY LOWER EXTREMITY EVALUATION   Patient Name: Anna Mcguire MRN: [id number] DOB:[birth year], 67 y.o., female Today's Date: 06/29/2023  END OF SESSION:   Past Medical History:  Diagnosis Date   Acid reflux    Anxiety    Arthritis    COPD (chronic obstructive pulmonary disease) (HCC)    Depression    Dyspnea    Hepatitis    Hypertension    Past Surgical History:  Procedure Laterality Date   COLONOSCOPY WITH PROPOFOL N/A 06/07/2014   Procedure: COLONOSCOPY WITH PROPOFOL;  Surgeon: Willis Modena, MD;  Location: [facility];  Service: Endoscopy;  Laterality: N/A;   ESOPHAGOGASTRODUODENOSCOPY (EGD) WITH PROPOFOL N/A 06/07/2014   Procedure: ESOPHAGOGASTRODUODENOSCOPY (EGD) WITH PROPOFOL;  Surgeon: Willis Modena, MD;  Location: [facility]OSCOPY;  Service: Endoscopy;  Laterality: N/A;   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   Procedure: TOTAL KNEE ARTHROPLASTY;  Surgeon: Joen Laura, MD;  Location: [facility] ORS;  Service: Orthopedics;  Laterality: Left;   Patient Active Problem List   Diagnosis Date Noted   Primary osteoarthritis of left knee 06/24/2023   Normocytic anemia 04/08/2023   Loss of weight 04/08/2023   Vomiting without nausea 04/08/2023   Altered bowel habits 04/08/2023   Primary hyperparathyroidism (HCC) 03/26/2023   Arthritis of carpometacarpal (CMC) joint of left thumb 04/23/2022   Pain in left shoulder 04/23/2022   Hypercalcemia 12/31/2021   Stage 3b chronic kidney disease (HCC) 12/31/2021   Allergic rhinitis 10/24/2020   Rotator cuff arthropathy of left shoulder 08/07/2020   Cervicalgia 08/07/2020   Chronic cough 03/21/2020   Alcohol use disorder, moderate, dependence (HCC) 01/30/2020   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   Tobacco dependence 01/30/2020   Diarrhea of infectious origin 01/30/2020   Hyponatremia 01/13/2020   Alcohol abuse 01/13/2020   Tobacco abuse 01/13/2020   Chronic diarrhea  01/13/2020   Hypophosphatemia 01/13/2020   Hypertension    Chronic obstructive pulmonary disease (HCC)    Alcohol use    Hypokalemia 01/12/2020   Tobacco use 10/06/2019   Unilateral primary osteoarthritis, left knee 09/08/2019   Pain in right shoulder 08/11/2019    PCP: Marcine Matar, MD  REFERRING PROVIDER: Joen Laura, MD  REFERRING DIAG: L TKR 06/29/23  THERAPY DIAG:  No diagnosis found.  Rationale for Evaluation and Treatment: Rehabilitation  ONSET DATE: *** POST OP LEFT TOTAL KNEE REPLACEMENT 06/29/23   SUBJECTIVE:   SUBJECTIVE STATEMENT: Patient reports that she  She is present with her sister who states that she has had 2 falls since the surgery.   PERTINENT HISTORY: ***  PAIN:  Are you having pain?  Yes: NPRS scale: 8/10 current & at worst Pain location: L knee  Pain description: aching  Aggravating factors: movement  Relieving factors: pain medicine   PRECAUTIONS: Knee and Fall  RED FLAGS: {PT Red Flags:29287}   WEIGHT BEARING RESTRICTIONS: {Yes ***/No:24003}  FALLS:  Has patient fallen in last 6 months? Yes. Number of falls ***  LIVING ENVIRONMENT: Lives with: {OPRC lives with:25569::"lives with their family"} Lives in: {Lives in:25570} Stairs: {opstairs:27293} Has following equipment at home: {Assistive devices:23999}  OCCUPATION: ***  PLOF: Independent  PATIENT GOALS: ***  NEXT MD VISIT: 07/28/2023  OBJECTIVE:  Note: Objective measures were completed at Evaluation unless otherwise noted.  DIAGNOSTIC FINDINGS: ***  PATIENT SURVEYS:  FOTO ***  COGNITION: Overall cognitive status: Within functional limits for tasks assessed     SENSATION: {sensation:27233}  EDEMA:  {edema:24020}  MUSCLE LENGTH: Hamstrings:  Right *** deg; Left *** deg Maisie Fus test: Right *** deg; Left *** deg  POSTURE: {posture:25561}  PALPATION: ***  LOWER EXTREMITY ROM:  Active ROM Right eval Left eval  Hip flexion    Hip extension     Hip abduction    Hip adduction    Hip internal rotation    Hip external rotation    Knee flexion  90  Knee extension  -8  Ankle dorsiflexion    Ankle plantarflexion    Ankle inversion    Ankle eversion     (Blank rows = not tested)  LOWER EXTREMITY MMT:  MMT Right eval Left eval  Hip flexion    Hip extension    Hip abduction    Hip adduction    Hip internal rotation    Hip external rotation    Knee flexion    Knee extension    Ankle dorsiflexion    Ankle plantarflexion    Ankle inversion    Ankle eversion     (Blank rows = not tested)  LOWER EXTREMITY SPECIAL TESTS:  {LEspecialtests:26242}  FUNCTIONAL TESTS:  {Functional tests:24029}  GAIT: Distance walked: *** Assistive device utilized: {Assistive devices:23999} Level of assistance: {Levels of assistance:24026} Comments: ***                                                                                                                                TREATMENT DATE: ***    PATIENT EDUCATION:  Education details: *** Person educated: Patient Education method: Programmer, multimedia, Facilities manager, and Handouts Education comprehension: verbalized understanding, returned demonstration, and needs further education  HOME EXERCISE PROGRAM: Ankle pumps Knee presses Towel presses Heel slides Short kicks SLR Leg on side Knee bending  Assisted knee bending Long kicks   ASSESSMENT:  CLINICAL IMPRESSION: Anessia is a 67 y.o. female who was seen today for physical therapy evaluation and treatment 5 days s/p L TKA. She is demonstrating decreased Left knee AROM, diminished MMT score, altered gait mechanics, and increased reliance on AD for safe ambulation. She has related pain and difficulty with ***. She requires skilled PT services at this time to address relevant deficits and improve overall function.     OBJECTIVE IMPAIRMENTS: {opptimpairments:25111}.   ACTIVITY LIMITATIONS:  {activitylimitations:27494}  PARTICIPATION LIMITATIONS: {participationrestrictions:25113}  PERSONAL FACTORS: {Personal factors:25162} are also affecting patient's functional outcome.   REHAB POTENTIAL: {rehabpotential:25112}  CLINICAL DECISION MAKING: {clinical decision making:25114}  EVALUATION COMPLEXITY: {Evaluation complexity:25115}   GOALS: Goals reviewed with patient? Yes  SHORT TERM GOALS: Target date: 07/27/2023   *** Baseline: Goal status: INITIAL  2.  *** Baseline:  Goal status: INITIAL  3.  *** Baseline:  Goal status: INITIAL  4.  *** Baseline:  Goal status: INITIAL  5.  *** Baseline:  Goal status: INITIAL  6.  *** Baseline:  Goal status: INITIAL  LONG TERM GOALS: Target date: ***  *** Baseline:  Goal status: INITIAL  2.  *** Baseline:  Goal  status: INITIAL  3.  *** Baseline:  Goal status: INITIAL  4.  *** Baseline:  Goal status: INITIAL  5.  *** Baseline:  Goal status: INITIAL  6.  *** Baseline:  Goal status: INITIAL   PLAN:  PT FREQUENCY: {rehab frequency:25116}  PT DURATION: {rehab duration:25117}  PLANNED INTERVENTIONS: {rehab planned interventions:25118::"97110-Therapeutic exercises","97530- Therapeutic [phone]- Neuromuscular re-education","97535- Self JXBJ","47829- Manual therapy"}  PLAN FOR NEXT SESSION: Mauri Reading, PT, DPT   06/29/2023, 12:13 PM

## 2023-07-04 ENCOUNTER — Other Ambulatory Visit (HOSPITAL_COMMUNITY): Payer: Self-pay

## 2023-07-06 ENCOUNTER — Ambulatory Visit: Payer: Self-pay

## 2023-07-06 NOTE — Telephone Encounter (Signed)
Message from Phill Myron sent at 07/06/2023 10:51 AM EST  Summary: knee swelling, pain  and Vaginal itching and discharge   S/p left knee surgery, still having pain and swelling.  Question regarding medications. Also having vaginal itching and discharge.         Chief Complaint: vaginal discharge (white) Symptoms: intermittent vaginal pain, itching inside vagina, subj. Fever,  Frequency: 1 week Pertinent Negatives: Patient denies foul odor, vag bleeding Disposition: [] ED /[] Urgent Care (no appt availability in office) / [] Appointment(In office/virtual)/ []  [facility]/ [] Home Care/ [] Refused Recommended Disposition /[x] [facility]/ []  Follow-up with PCP Additional Notes: pt req. Appt no appts. Given address of mobile bus.  Advised pt to speak to her surgeon regarding knee pain and swelling. Call transferred to [phone] (surgeons office)  Reason for Disposition  [1] Symptoms of a "yeast infection" (i.e., itchy, white discharge, not bad smelling) AND [2] not improved > 3 days following Care Advice  Answer Assessment - Initial Assessment Questions 1. LOCATION and RADIATION: "Where is the pain located?"      Left knee  2. QUALITY: "What does the pain feel like?"  (e.g., sharp, dull, aching, burning)     *No Answer* 3. SEVERITY: "How bad is the pain?" "What does it keep you from doing?"   (Scale 1-10; or mild, moderate, severe)   -  MILD (1-3): doesn't interfere with normal activities    -  MODERATE (4-7): interferes with normal activities (e.g., work or school) or awakens from sleep, limping    -  SEVERE (8-10): excruciating pain, unable to do any normal activities, unable to walk     7/10 4. ONSET: "When did the pain start?" "Does it come and go, or is it there all the time?"     S/p knee replacement 5. RECURRENT: "Have you had this pain before?" If Yes, ask: "When, and what happened then?"     *No Answer* 6. SETTING: "Has there been any recent  work, exercise or other activity that involved that part of the body?"      *No Answer* 7. AGGRAVATING FACTORS: "What makes the knee pain worse?" (e.g., walking, climbing stairs, running)     *No Answer* 8. ASSOCIATED SYMPTOMS: "Is there any swelling or redness of the knee?"     swelling 9. OTHER SYMPTOMS: "Do you have any other symptoms?" (e.g., chest pain, difficulty breathing, fever, calf pain)     Fever subjective skin real hot and cold chills  Answer Assessment - Initial Assessment Questions 1. DISCHARGE: "Describe the discharge." (e.g., white, yellow, green, gray, foamy, cottage cheese-like)     white 2. ODOR: "Is there a bad odor?"     no 3. ONSET: "When did the discharge begin?"     1 week  4. RASH: "Is there a rash in the genital area?" If Yes, ask: "Describe it." (e.g., redness, blisters, sores, bumps)     no 5. ABDOMEN PAIN: "Are you having any abdomen pain?" If Yes, ask: "What does it feel like? " (e.g., crampy, dull, intermittent, constant)    Vaginal pain  comes and goes 6. ABDOMEN PAIN SEVERITY: If present, ask: "How bad is it?" (e.g., Scale 1-10; mild, moderate, or severe)   - MILD (1-3): Doesn't interfere with normal activities, abdomen soft and not tender to touch.    - MODERATE (4-7): Interferes with normal activities or awakens from sleep, abdomen tender to touch.    - SEVERE (8-10): Excruciating pain, doubled over, unable to  do any normal activities. (R/O peritonitis)      Vaginal sharp pain 5/10 7. CAUSE: "What do you think is causing the discharge?" "Have you had the same problem before? What happened then?"     Has had vaginal issues before  8. OTHER SYMPTOMS: "Do you have any other symptoms?" (e.g., fever, itching, vaginal bleeding, pain with urination, injury to genital area, vaginal foreign body)     fever 9. PREGNANCY: "Is there any chance you are pregnant?" "When was your last menstrual period?"     N/a  Protocols used: Knee Pain-A-AH, Vaginal  Discharge-A-AH

## 2023-07-06 NOTE — Telephone Encounter (Signed)
noted 

## 2023-07-07 ENCOUNTER — Encounter: Payer: Self-pay | Admitting: Physical Therapy

## 2023-07-07 ENCOUNTER — Encounter (HOSPITAL_COMMUNITY): Payer: Self-pay | Admitting: *Deleted

## 2023-07-07 ENCOUNTER — Ambulatory Visit: Payer: 59 | Admitting: Physical Therapy

## 2023-07-07 ENCOUNTER — Ambulatory Visit (HOSPITAL_COMMUNITY)
Admission: EM | Admit: 2023-07-07 | Discharge: 2023-07-07 | Disposition: A | Discharge status: Home / Self Care | Payer: 59

## 2023-07-07 DIAGNOSIS — M25562 Pain in left knee: Secondary | ICD-10-CM | POA: Diagnosis not present

## 2023-07-07 DIAGNOSIS — Z96652 Presence of left artificial knee joint: Secondary | ICD-10-CM | POA: Diagnosis not present

## 2023-07-07 DIAGNOSIS — G8929 Other chronic pain: Secondary | ICD-10-CM

## 2023-07-07 DIAGNOSIS — R600 Localized edema: Secondary | ICD-10-CM

## 2023-07-07 DIAGNOSIS — R262 Difficulty in walking, not elsewhere classified: Secondary | ICD-10-CM

## 2023-07-07 MED ORDER — MUPIROCIN CALCIUM 2 % EX CREA: 1.0000 | TOPICAL_CREAM | Freq: Two times a day (BID) | CUTANEOUS | 0 refills | Status: DC

## 2023-07-07 NOTE — ED Triage Notes (Signed)
 Pt states she went to PT this morning and they advised her to come in to UC she is post op left knee surgery (06/24/2023). She states that her left knee has been bleeding since Sunday. She called her PC and her surgeon they advised her to come to UC as well.

## 2023-07-07 NOTE — ED Provider Notes (Signed)
 [facility]    CSN: [id number] Arrival date & time: 07/07/23  1549      History   Chief Complaint Chief Complaint  Patient presents with   Knee Pain    HPI Anna Mcguire is a 67 y.o. female presents due to having L knee bleeding x 2 days. Has surgery 12/18. She called her PCP and surgeon and was told to come here. She does not recall seeing any blood on her bandage the day before she saw the blood. She denies injuring her knee on anything. She does admit of having more pain today. She has been to PT today. She read her discharge instructions and said the bandage should have been removed on 12/25 and she did not know. She denies fever, chills or sweats.     Past Medical History:  Diagnosis Date   Acid reflux    Anxiety    Arthritis    COPD (chronic obstructive pulmonary disease) (HCC)    Depression    Dyspnea    Hepatitis    Hypertension     Patient Active Problem List   Diagnosis Date Noted   Primary osteoarthritis of left knee 06/24/2023   Normocytic anemia 04/08/2023   Loss of weight 04/08/2023   Vomiting without nausea 04/08/2023   Altered bowel habits 04/08/2023   Primary hyperparathyroidism (HCC) 03/26/2023   Arthritis of carpometacarpal (CMC) joint of left thumb 04/23/2022   Pain in left shoulder 04/23/2022   Hypercalcemia 12/31/2021   Stage 3b chronic kidney disease (HCC) 12/31/2021   Allergic rhinitis 10/24/2020   Rotator cuff arthropathy of left shoulder 08/07/2020   Cervicalgia 08/07/2020   Chronic cough 03/21/2020   Alcohol use disorder, moderate, dependence (HCC) 01/30/2020   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   Tobacco dependence 01/30/2020   Diarrhea of infectious origin 01/30/2020   Hyponatremia 01/13/2020   Alcohol abuse 01/13/2020   Tobacco abuse 01/13/2020   Chronic diarrhea 01/13/2020   Hypophosphatemia 01/13/2020   Hypertension    Chronic obstructive pulmonary disease (HCC)    Alcohol  use    Hypokalemia 01/12/2020   Tobacco use 10/06/2019   Unilateral primary osteoarthritis, left knee 09/08/2019   Pain in right shoulder 08/11/2019    Past Surgical History:  Procedure Laterality Date   COLONOSCOPY WITH PROPOFOL  N/A 06/07/2014   Procedure: COLONOSCOPY WITH PROPOFOL ;  Surgeon: Elsie Cree, MD;  Location: [facility];  Service: Endoscopy;  Laterality: N/A;   ESOPHAGOGASTRODUODENOSCOPY (EGD) WITH PROPOFOL  N/A 06/07/2014   Procedure: ESOPHAGOGASTRODUODENOSCOPY (EGD) WITH PROPOFOL ;  Surgeon: Elsie Cree, MD;  Location: [facility];  Service: Endoscopy;  Laterality: N/A;   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   Procedure: TOTAL KNEE ARTHROPLASTY;  Surgeon: Edna Toribio LABOR, MD;  Location: [facility];  Service: Orthopedics;  Laterality: Left;    OB History   No obstetric history on file.      Home Medications    Prior to Admission medications   Medication Sig Start Date End Date Taking? Authorizing Provider  acetaminophen  (TYLENOL ) 500 MG tablet Take 2 tablets (1,000 mg total) by mouth every 8 (eight) hours as needed. 06/25/23 07/25/23 Yes Marchwiany, Toribio LABOR, MD  amLODipine  (NORVASC ) 10 MG tablet TAKE 1 Tablet BY MOUTH ONCE DAILY FOR BLOOD PRESSURE 03/13/23  Yes Vicci Barnie NOVAK, MD  aspirin  EC 81 MG tablet Take 1 tablet (81 mg total) by mouth 2 (two) times daily for 28 days. Swallow whole. 06/25/23 07/23/23 Yes Edna Toribio LABOR, MD  atorvastatin  (LIPITOR )  80 MG tablet TAKE 1 Tablet BY MOUTH ONCE DAILY 03/16/23  Yes Vicci Barnie NOVAK, MD  DULERA  100-5 MCG/ACT AERO INHALE 2 PUFFS BY MOUTH TWICE DAILY 01/28/23  Yes Vicci Barnie NOVAK, MD  ferrous sulfate  325 (65 FE) MG tablet Take 1 tablet (325 mg total) by mouth daily with breakfast. Patient to pay out of pocket if not covered by her insurance. 05/14/23  Yes Vicci Barnie NOVAK, MD  hydrALAZINE  (APRESOLINE ) 10 MG tablet Take 1 tablet (10 mg total) by mouth 3 (three) times daily. 05/14/23  Yes Vicci Barnie NOVAK, MD   irbesartan  (AVAPRO ) 75 MG tablet Take 1 tablet (75 mg total) by mouth daily. 03/13/23  Yes Vicci Barnie NOVAK, MD  mupirocin  cream (BACTROBAN ) 2 % Apply 1 Application topically 2 (two) times daily. For 10 days 07/07/23  Yes Rodriguez-Southworth, Kyra, PA-C  oxyCODONE  (OXY IR/ROXICODONE ) 5 MG immediate release tablet Take 2.5-5 mg by mouth every 4 (four) hours as needed. 07/06/23  Yes [provider]  pantoprazole  (PROTONIX ) 40 MG tablet Take 1 tablet (40 mg total) by mouth 2 (two) times daily. 05/14/23  Yes Vicci Barnie NOVAK, MD  sertraline  (ZOLOFT ) 25 MG tablet Take 1 tablet (25 mg total) by mouth daily. 03/13/23  Yes Vicci Barnie NOVAK, MD  tiotropium (SPIRIVA  HANDIHALER) 18 MCG inhalation capsule PLACE 1 CAPSULE INTO INHALER AND INHALE DAILY 04/21/23  Yes Vicci Barnie NOVAK, MD  Vitamin D , Cholecalciferol , 10 MCG (400 UNIT) CAPS Take 400 Int'l Units by mouth daily. 03/26/23  Yes Vicci Barnie NOVAK, MD  albuterol  (PROVENTIL ) (2.5 MG/3ML) 0.083% nebulizer solution inhale THE contents of 1 vial PER nebulizer EVERY 6 HOURS AS NEEDED FOR wheezing OR SHORTNESS OF BREATH 12/31/21   Vicci Barnie NOVAK, MD  albuterol  (VENTOLIN  HFA) 108 (90 Base) MCG/ACT inhaler inhale 2 puffs BY MOUTH EVERY 6 HOURS AS NEEDED FOR wheezing OR shortness of breath 04/21/23   Vicci Barnie NOVAK, MD  diclofenac  Sodium (VOLTAREN ) 1 % GEL Apply 2 g topically 4 (four) times daily. 05/14/23   Vicci Barnie NOVAK, MD  ondansetron  (ZOFRAN ) 4 MG tablet Take 1 tablet (4 mg total) by mouth every 8 (eight) hours as needed for up to 14 days for nausea or vomiting. 06/25/23 07/09/23  Edna Toribio LABOR, MD  polyethylene glycol powder (MIRALAX ) 17 GM/SCOOP powder Take 17 g by mouth daily. Mix with liquid as directed. 06/25/23   Edna Toribio LABOR, MD  fluticasone  (FLONASE) 50 MCG/ACT nasal spray Place 1 spray into both nostrils daily.  05/12/19  [provider]  lovastatin (MEVACOR) 20 MG tablet Take 20 mg by mouth daily at 6 PM.   05/12/19  [provider]    Family History Family History  Problem Relation Age of Onset   Kidney failure Mother    Aneurysm Father     Social History Social History   Tobacco Use   Smoking status: Every Day    Current packs/day: 0.25    Types: Cigarettes   Smokeless tobacco: Never   Tobacco comments:    3 times a week - 10/24/2020    Buproprion helps  Vaping Use   Vaping status: Never Used  Substance Use Topics   Alcohol use: Not Currently   Drug use: Yes    Types: Marijuana    Comment: last used 04/27/23     Allergies   Chantix [varenicline tartrate] and Penicillins   Review of Systems Review of Systems As noted in HPI  Physical Exam Triage Vital Signs ED Triage Vitals  Encounter Vitals Group     BP 07/07/23 1648 (!) 150/73     Systolic BP Percentile --      Diastolic BP Percentile --      Pulse Rate 07/07/23 1648 93     Resp 07/07/23 1648 20     Temp 07/07/23 1648 98.9 F (37.2 C)     Temp Source 07/07/23 1648 Oral     SpO2 07/07/23 1648 98 %     Weight --      Height --      Head Circumference --      Peak Flow --      Pain Score 07/07/23 1645 8     Pain Loc --      Pain Education --      Exclude from Growth Chart --    No data found.  Updated Vital Signs BP (!) 150/73 (BP Location: Right Arm)   Pulse 93   Temp 98.9 F (37.2 C) (Oral)   Resp 20   SpO2 98%   Visual Acuity Right Eye Distance:   Left Eye Distance:   Bilateral Distance:    Right Eye Near:   Left Eye Near:    Bilateral Near:     Physical Exam Vitals and nursing note reviewed.  Constitutional:      General: She is not in acute distress.    Appearance: She is not toxic-appearing.  HENT:     Right Ear: External ear normal.     Left Ear: External ear normal.  Eyes:     General: No scleral icterus.    Conjunctiva/sclera: Conjunctivae normal.  Pulmonary:     Effort: Pulmonary effort is normal.  Musculoskeletal:     Cervical back: Neck supple.      Comments: L KNEE- with dried blood on the bandage which I removed. The would is healing well, skin is a little macerated, but there is no redness, warmth or odor. Joint is still swollen. There is scant mount of serous matter on the gauze when I patted the wound. There is no active bleeding of the wound.   Neurological:     Mental Status: She is alert and oriented to person, place, and time.     Gait: Gait normal.  Psychiatric:        Mood and Affect: Mood normal.        Behavior: Behavior normal.        Thought Content: Thought content normal.        Judgment: Judgment normal.      UC Treatments / Results  Labs (all labs ordered are listed, but only abnormal results are displayed) Labs Reviewed - No data to display  EKG   Radiology No results found.  Procedures Procedures (including critical care time)  Medications Ordered in UC Medications - No data to display  Initial Impression / Assessment and Plan / UC Course  I have reviewed the triage vital signs and the nursing notes.  Post L total knee healing and no signs of infection  The wound was cleansed and antibiotic ointment applied with dressing and I sent Bactroban  to her pharmacy to continue this. See instruction.     Final Clinical Impressions(s) / UC Diagnoses   Final diagnoses:  Status post total left knee replacement     Discharge Instructions      Please call your orthopedic doctor and have a follow up on Thursday or Friday this week.  If the knee  gets hot, red  and severe pain, please go to the ER since you will need IV antibiotics and we can't do that here.      ED Prescriptions     Medication Sig Dispense Auth. Provider   mupirocin  cream (BACTROBAN ) 2 % Apply 1 Application topically 2 (two) times daily. For 10 days 30 g Rodriguez-Southworth, Kyra, PA-C      PDMP not reviewed this encounter.   Lindi Kyra, PA-C 07/07/23 1735

## 2023-07-07 NOTE — Discharge Instructions (Signed)
Please call your orthopedic doctor and have a follow up on Thursday or Friday this week.  If the knee  gets hot, red and severe pain, please go to the ER since you will need IV antibiotics and we can't do that here.

## 2023-07-07 NOTE — Therapy (Signed)
 Daily Note   Patient Name: Niylah Hassan MRN: [id number] DOB:1955-07-10, 67 y.o., female Today's Date: 07/07/2023  END OF SESSION:  PT End of Session - 07/07/23 1304     Visit Number 2    Number of Visits 17    Date for PT Re-Evaluation 08/24/23    Authorization Type [employer] MCR/MCD secondary    PT Start Time 1304    PT Stop Time 1344    PT Time Calculation (min) 40 min    Activity Tolerance Patient limited by pain    Behavior During Therapy --             Past Medical History:  Diagnosis Date   Acid reflux    Anxiety    Arthritis    COPD (chronic obstructive pulmonary disease) (HCC)    Depression    Dyspnea    Hepatitis    Hypertension    Past Surgical History:  Procedure Laterality Date   COLONOSCOPY WITH PROPOFOL  N/A 06/07/2014   Procedure: COLONOSCOPY WITH PROPOFOL ;  Surgeon: Elsie Cree, MD;  Location: [facility];  Service: Endoscopy;  Laterality: N/A;   ESOPHAGOGASTRODUODENOSCOPY (EGD) WITH PROPOFOL  N/A 06/07/2014   Procedure: ESOPHAGOGASTRODUODENOSCOPY (EGD) WITH PROPOFOL ;  Surgeon: Elsie Cree, MD;  Location: [facility];  Service: Endoscopy;  Laterality: N/A;   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   Procedure: TOTAL KNEE ARTHROPLASTY;  Surgeon: Edna Toribio LABOR, MD;  Location: [facility];  Service: Orthopedics;  Laterality: Left;   Patient Active Problem List   Diagnosis Date Noted   Primary osteoarthritis of left knee 06/24/2023   Normocytic anemia 04/08/2023   Loss of weight 04/08/2023   Vomiting without nausea 04/08/2023   Altered bowel habits 04/08/2023   Primary hyperparathyroidism (HCC) 03/26/2023   Arthritis of carpometacarpal (CMC) joint of left thumb 04/23/2022   Pain in left shoulder 04/23/2022   Hypercalcemia 12/31/2021   Stage 3b chronic kidney disease (HCC) 12/31/2021   Allergic rhinitis 10/24/2020   Rotator cuff arthropathy of left shoulder 08/07/2020   Cervicalgia 08/07/2020   Chronic cough 03/21/2020   Alcohol use  disorder, moderate, dependence (HCC) 01/30/2020   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   Tobacco dependence 01/30/2020   Diarrhea of infectious origin 01/30/2020   Hyponatremia 01/13/2020   Alcohol abuse 01/13/2020   Tobacco abuse 01/13/2020   Chronic diarrhea 01/13/2020   Hypophosphatemia 01/13/2020   Hypertension    Chronic obstructive pulmonary disease (HCC)    Alcohol use    Hypokalemia 01/12/2020   Tobacco use 10/06/2019   Unilateral primary osteoarthritis, left knee 09/08/2019   Pain in right shoulder 08/11/2019    PCP: Vicci Barnie NOVAK, MD  REFERRING PROVIDER: Edna Toribio LABOR, MD  REFERRING DIAG: L TKA 06/24/23  THERAPY DIAG:  Chronic pain of left knee  Difficulty in walking, not elsewhere classified  Localized edema  Rationale for Evaluation and Treatment: Rehabilitation  ONSET DATE: 06/24/23 DOS    SUBJECTIVE:   SUBJECTIVE STATEMENT:  Pt reports that she had a rough night last night, but overall is starting to improve.   PERTINENT HISTORY: Relevant PMHx includes anxiety, arthritis, COPD, depression, dyspnea on exertion, hypertension, hyperparathyroidism, stage 3b CKD, history of tobacco and alcohol use PAIN:  Are you having pain?  Yes: NPRS scale: 8/10 current & at worst Pain location: L knee  Pain description: aching  Aggravating factors: movement  Relieving factors: pain medicine   PRECAUTIONS: Knee and Fall  RED FLAGS: None   WEIGHT BEARING RESTRICTIONS: No  FALLS:  Has patient fallen in last 6 months? Yes. Number of falls 2 since date of surgery, reports several prior to surgery  LIVING ENVIRONMENT: Lives with: lives with their family and lives alone Stairs: No Has following equipment at home: Vannie - 2 wheeled and Environmental Consultant - 4 wheeled  OCCUPATION: N/A  PLOF: Independent  PATIENT GOALS: To decrease pain, improve independence  NEXT MD VISIT: 07/28/2023  OBJECTIVE:  Note: Objective measures were  completed at Evaluation unless otherwise noted.  DIAGNOSTIC FINDINGS:   06/24/23  FINDINGS: Interval total left knee arthroplasty. No perihardware lucency is seen to indicate hardware failure or loosening. Postoperative changes of intra-articular air and subcutaneous air. Tiny joint effusion. No acute fracture or dislocation.   IMPRESSION: Interval total left knee arthroplasty without evidence of hardware failure.  PATIENT SURVEYS:  FOTO 23 current, 50 predicted  COGNITION: Overall cognitive status: Within functional limits for tasks assessed     SENSATION: Not tested  POSTURE: flexed trunk  and weight shift right  PALPATION: Min-to-moderate TTP surrounding surgical site; moderate TTP around L hip   LOWER EXTREMITY ROM:  Active ROM Right eval Left eval 12/31  Hip flexion     Hip extension     Hip abduction     Hip adduction     Hip internal rotation     Hip external rotation     Knee flexion  90 95  Knee extension  -8 Lacking 6 PROM  Ankle dorsiflexion     Ankle plantarflexion     Ankle inversion     Ankle eversion      (Blank rows = not tested)  LOWER EXTREMITY MMT:  MMT Right eval Left eval  Hip flexion    Hip extension    Hip abduction    Hip adduction    Hip internal rotation    Hip external rotation    Knee flexion  2-  Knee extension  2-  Ankle dorsiflexion    Ankle plantarflexion    Ankle inversion    Ankle eversion     (Blank rows = not tested)   FUNCTIONAL TESTS:  30 seconds chair stand test: to be assessed  2 minute walk test: To be assessed  GAIT: Distance walked: 0 ft Assistive device utilized: Wheelchair (manual) Level of assistance: Mod A for transfers  Comments: unable to transfer with Mod I, limited ambulation tolerance with AD d/t L knee and hip pain                                                                                                                                 TREATMENT DATE:   [facility] Adult PT Treatment   07/07/2023:  Therapeutic Exercise:  nu-step L3 39m while taking subjective and planning session with patient SAQ - 3x10 SLR - 5x5 Supine clam- RTB - 3x10  Manual Therapy  Tailgate AP and PA mobs with distraction Knee ext mob in supine  Modalities:  Vasopneumatic (Game Ready)  (some discomfort with this, adjusted knee position to slight flexion which helped)  Location:  left knee Time:  10 minutes Pressure:  medium Temperature:  40 degrees  [facility] Adult PT Treatment:                                                DATE: 06/29/2023  Initial evaluation: see patient education and home exercise program as noted below      PATIENT EDUCATION:  Education details: reviewed initial home exercise program; discussion of POC, prognosis and goals for skilled PT   Person educated: Patient and Sister Education method: Explanation, Demonstration, and Handouts Education comprehension: verbalized understanding, returned demonstration, and needs further education  HOME EXERCISE PROGRAM: Access Code: [email or web address] URL: [email or web address] Date: 07/07/2023 Prepared by: Helene Gasmen  Exercises - Supine Ankle Pumps  - 8 x daily - 7 x weekly - 2 sets - 10 reps - Supine Heel Slide with Strap  - 1 x daily - 7 x weekly - 3 sets - 10 reps - Seated Hamstring Stretch  - 2 x daily - 7 x weekly - 1 sets - 3 reps - 45 hold - Long Sitting 4 Way Patellar Glide  - 4 x daily - 7 x weekly - 3 sets - 10 reps - Supine Quad Set  - 5 x daily - 7 x weekly - 2 sets - 10 reps - 10 hold - Small Range Straight Leg Raise  - 1 x daily - 7 x weekly - 3 sets - 10 reps - Ice  - 5 x daily - 7 x weekly - 1 sets - 1 reps - 20 min hold  ASSESSMENT:  CLINICAL IMPRESSION: Petrita tolerated session well with no adverse reaction.  Pt reports overall reduction in pain since eval but continues to have significant pain, particularly at night.  She has not seen urgent care following her fall but reports improving hip  pain at this point.  However she does have significant L shoulder pain following the fall.  I encouraged her to follow up regarding this; she confirms understanding.  She does have a ~2'' spot of fresh serosanguinous drainage which is visible through her dressing.  She reports this is new since yesterday.  I will send message to surgeon regarding this and encouraged her to follow up as well.   OBJECTIVE IMPAIRMENTS: Abnormal gait, decreased activity tolerance, decreased balance, decreased endurance, difficulty walking, decreased ROM, decreased strength, decreased safety awareness, and pain.   ACTIVITY LIMITATIONS: carrying, lifting, bending, standing, squatting, sleeping, stairs, transfers, bed mobility, bathing, toileting, dressing, hygiene/grooming, and locomotion level  PARTICIPATION LIMITATIONS: meal prep, cleaning, laundry, interpersonal relationship, shopping, and community activity  PERSONAL FACTORS: Behavior pattern, Past/current experiences, and 3+ comorbidities: Relevant PMHx includes anxiety, arthritis, COPD, depression, dyspnea on exertion, hypertension, hyperparathyroidism, stage 3b CKD, history of tobacco and alcohol use  are also affecting patient's functional outcome.   REHAB POTENTIAL: Fair    CLINICAL DECISION MAKING: Evolving/moderate complexity  EVALUATION COMPLEXITY: Moderate   GOALS: Goals reviewed with patient? Yes  SHORT TERM GOALS: Target date: 07/27/2023   Patient will be independent with initial home program for knee ROM and strengthening as appropriate to post op status .  Baseline: reviewed at eval  Goal status: INITIAL  2.  Patient will demonstrate improved standing tolerance using LRAD and  upright posture.  Baseline: unable to stand d/t pain Goal status: INITIAL   3.  Patient will demonstrate at least Mod I with stand-pivot transfers and safe mechanics.  Baseline: requiring min-mod A with pivot transfer from w/c to treatament table Goal status: INITIAL    LONG TERM GOALS: Target date: 08/23/2022  Patient will report improved overall functional ability with FOTO score of 50 or greater  Baseline: 23 Goal status: INITIAL  2.  Patient will demonstrate at least 120 deg knee flexion AROM and 0 degrees of knee extension AROM.  Baseline: see objective measures  Goal status: INITIAL  3.  Patient will demonstrate ability to safely ambulate at least 150 ft during 2 minute walk test and use of LRAD.  Baseline: unable to tolerate ambulation  Goal status: INITIAL  4.  Patient will demonstrate at least 4/5 MMT with left LE resisted testing Baseline: see objective measures  Goal status: INITIAL  5.  Patient will report ability to perform all ADLs/IADLs with <4/10 pain severity, using activity modifications and rest breaks as appropriate.  Baseline: limited by pain  Goal status: INITIAL    PLAN:  PT FREQUENCY: 2x/week  PT DURATION: 8 weeks  PLANNED INTERVENTIONS: 97164- PT Re-evaluation, 97110-Therapeutic exercises, 97530- Therapeutic activity, 97112- Neuromuscular re-education, 97535- Self Care, 02859- Manual therapy, [phone]- Gait training, [phone]- Aquatic Therapy, [phone]- Vasopneumatic device, Patient/Family education, Balance training, Stair training, Taping, Cryotherapy, and Moist heat  PLAN FOR NEXT SESSION: Left knee ROM, lower extremity strengthening as appropriate for postop status, aerobic activity for AAROM and pain management, transfer training including bed mobility, assess 30sec sit to stand, 2-minute walk test    Helene FORBES Gasmen PT 07/07/2023, 2:51 PM    Date of referral: 06/03/2023 Referring provider: Edna Toribio LABOR, MD Referring diagnosis? Left Total Knee Arthroplasty  Treatment diagnosis? (if different than referring diagnosis) Chronic pain of left knee Difficulty in walking, not elsewhere classified Localized edema  What was this (referring dx) caused by? Surgery (Type: total knee arthroplasty)  Lysle of  Condition: Initial Onset (within last 3 months)   Laterality: Lt  Current Functional Measure Score: FOTO 23% functional  Objective measurements identify impairments when they are compared to normal values, the uninvolved extremity, and prior level of function.  [x]  Yes  []  No  Objective assessment of functional ability: Severe functional limitations   Briefly describe symptoms: left knee pain, decreased mobility, decreased strength, localized edema, left leg pain   How did symptoms start: surgery   Average pain intensity:  Last 24 hours: 8/10  Past week: 10/10  How often does the pt experience symptoms? Constantly  How much have the symptoms interfered with usual daily activities? Extremely  How has condition changed since care began at this facility? NA - initial visit  In general, how is the patients overall health? Fair   BACK PAIN (STarT Back Screening Tool) No

## 2023-07-09 DIAGNOSIS — M25512 Pain in left shoulder: Secondary | ICD-10-CM | POA: Diagnosis not present

## 2023-07-09 DIAGNOSIS — M1712 Unilateral primary osteoarthritis, left knee: Secondary | ICD-10-CM | POA: Diagnosis not present

## 2023-07-10 ENCOUNTER — Telehealth: Payer: Self-pay | Admitting: Internal Medicine

## 2023-07-10 DIAGNOSIS — F320 Major depressive disorder, single episode, mild: Secondary | ICD-10-CM

## 2023-07-10 DIAGNOSIS — F419 Anxiety disorder, unspecified: Secondary | ICD-10-CM

## 2023-07-10 DIAGNOSIS — I10 Essential (primary) hypertension: Secondary | ICD-10-CM

## 2023-07-10 NOTE — Telephone Encounter (Signed)
 Medication Refill -  Most Recent Primary Care Visit:  Provider: VICCI SOBER B  Department: [facility]  Visit Type: OFFICE VISIT  Date: 05/14/2023  Medication:  pantoprazole  (PROTONIX ) 40 MG tablet  sertraline  (ZOLOFT ) 25 MG tablet  irbesartan  (AVAPRO ) 75 MG tablet   Has the patient contacted their pharmacy? No   Is this the correct pharmacy for this prescription? Yes  This is the patient's preferred pharmacy:   [facility] - [place], [place] - [address] [place] [address] Phone: [phone] Fax: [phone]   Has the prescription been filled recently? Yes  Is the patient out of the medication? Yes  Has the patient been seen for an appointment in the last year OR does the patient have an upcoming appointment? Yes  Can we respond through MyChart? No  Agent: Please be advised that Rx refills may take up to 3 business days. We ask that you follow-up with your pharmacy.

## 2023-07-13 ENCOUNTER — Other Ambulatory Visit: Payer: Self-pay | Admitting: Internal Medicine

## 2023-07-13 DIAGNOSIS — F320 Major depressive disorder, single episode, mild: Secondary | ICD-10-CM

## 2023-07-13 DIAGNOSIS — E785 Hyperlipidemia, unspecified: Secondary | ICD-10-CM

## 2023-07-13 DIAGNOSIS — M1712 Unilateral primary osteoarthritis, left knee: Secondary | ICD-10-CM

## 2023-07-13 DIAGNOSIS — I10 Essential (primary) hypertension: Secondary | ICD-10-CM

## 2023-07-13 DIAGNOSIS — F419 Anxiety disorder, unspecified: Secondary | ICD-10-CM

## 2023-07-13 NOTE — Telephone Encounter (Signed)
 Medication Refill -  Most Recent Primary Care Visit:  Provider: VICCI SOBER B  Department: [facility]  Visit Type: OFFICE VISIT  Date: 05/14/2023  Medication:  These medications were requested on 07/10/2023 pantoprazole  (PROTONIX ) 40 MG tablet  irbesartan  (AVAPRO ) 75 MG tablet  sertraline  (ZOLOFT ) 25 MG tablet   Pt is also requesting  diclofenac  Sodium (VOLTAREN ) 1 % GEL atorvastatin  (LIPITOR ) 80 MG tablet Did advise pt it could take up to 3 business days for refill request.   Has the patient contacted their pharmacy? Yes   Is this the correct pharmacy for this prescription? Yes If no, delete pharmacy and type the correct one.  This is the patient's preferred pharmacy:  [facility] - [place], [place] - [address] [place] [address] Phone: [phone] Fax: [phone]     Has the prescription been filled recently? Yes  Is the patient out of the medication? Yes  Has the patient been seen for an appointment in the last year OR does the patient have an upcoming appointment? Yes  Can we respond through MyChart? No  Agent: Please be advised that Rx refills may take up to 3 business days. We ask that you follow-up with your pharmacy.

## 2023-07-14 ENCOUNTER — Telehealth: Payer: Self-pay | Admitting: Internal Medicine

## 2023-07-14 MED ORDER — SERTRALINE HCL 25 MG PO TABS: 25.0000 mg | ORAL_TABLET | Freq: Every day | ORAL | 2 refills | Status: DC

## 2023-07-14 NOTE — Telephone Encounter (Signed)
 See request to change pharmacy on all medications.

## 2023-07-14 NOTE — Telephone Encounter (Signed)
 Requested Prescriptions  Pending Prescriptions Disp Refills   pantoprazole  (PROTONIX ) 40 MG tablet 60 tablet 4    Sig: Take 1 tablet (40 mg total) by mouth 2 (two) times daily.     Gastroenterology: Proton Pump Inhibitors Passed - 07/14/2023 10:02 AM      Passed - Valid encounter within last 12 months    Recent Outpatient Visits           2 months ago Primary osteoarthritis of left knee   [place] [facility] Dept Of [place]. [facility] Vicci Barnie NOVAK, MD   3 months ago Recurrent falls   [place] [facility] [place] - [facility] Dept Of [place]. [facility] Delbert Clam, MD   4 months ago Primary osteoarthritis of left knee   [place] [facility] [place] - [facility] Dept Of [place]. [facility] Vicci Barnie NOVAK, MD   7 months ago Black stools   [place] [facility] [place] - [facility] [place]. [facility] Barnie NOVAK, MD   10 months ago Need for hepatitis C screening test   [facility] Dept Of [place]. [facility] Fleeta Tonia Garnette LITTIE, RPH-CPP       Future Appointments             In 2 weeks Vicci Barnie NOVAK, MD [facility] [place] - A Dept Of [place]. [facility]   In 1 month Vicci, Barnie NOVAK, MD [facility] [place] - A Dept Of [place]. [facility]             sertraline  (ZOLOFT ) 25 MG tablet 30 tablet 2    Sig: Take 1 tablet (25 mg total) by mouth daily.     Psychiatry:  Antidepressants - SSRI - sertraline  Passed - 07/14/2023 10:02 AM      Passed - AST in normal range and within 360 days    AST  Date Value Ref Range Status  06/23/2023 20 15 - 41 U/L Final         Passed - ALT in normal range and within 360 days    ALT  Date Value Ref Range Status  06/23/2023 16 0 - 44 U/L Final         Passed - Completed PHQ-2 or PHQ-9 in the last 360 days      Passed - Valid encounter within last 6 months    Recent Outpatient  Visits           2 months ago Primary osteoarthritis of left knee   [place] [facility] - [facility] Dept Of [place]. [facility] Vicci Barnie NOVAK, MD   3 months ago Recurrent falls   [place] [facility] [place] - A Dept Of [place]. [facility] Delbert Clam, MD   4 months ago Primary osteoarthritis of left knee   [place] [facility] [place] - A Dept Of [place]. [facility] Vicci Barnie NOVAK, MD   7 months ago Black stools   [place] [facility] [place] - A Dept Of [place]. [facility] Vicci Barnie NOVAK, MD   10 months ago Need for hepatitis C screening test   [facility] - A Dept Of . [facility] Fleeta Tonia Garnette LITTIE, RPH-CPP       Future Appointments  In 2 weeks Vicci Barnie NOVAK, MD [facility] [place] - A Dept Of [place]. [facility]   In 1 month Vicci, Barnie NOVAK, MD [facility] [place] - A Dept Of [place]. [facility]             irbesartan  (AVAPRO ) 75 MG tablet 90 tablet 1    Sig: Take 1 tablet (75 mg total) by mouth daily.     Cardiovascular:  Angiotensin Receptor Blockers Failed - 07/14/2023 10:02 AM      Failed - Cr in normal range and within 180 days    Creatinine, Ser  Date Value Ref Range Status  06/25/2023 1.08 (H) 0.44 - 1.00 mg/dL Final         Failed - Last BP in normal range    BP Readings from Last 1 Encounters:  07/07/23 (!) 150/73         Passed - K in normal range and within 180 days    Potassium  Date Value Ref Range Status  06/25/2023 4.2 3.5 - 5.1 mmol/L Final         Passed - Patient is not pregnant      Passed - Valid encounter within last 6 months    Recent Outpatient Visits           2 months ago Primary osteoarthritis of left knee   [place] [facility] - [facility] Dept Of [place]. [facility] Vicci Barnie NOVAK, MD   3 months ago Recurrent falls   [facility] [place] - A Dept Of [place]. [facility] Delbert Clam, MD   4 months ago Primary osteoarthritis of left knee   [place] [facility] [place] - A Dept Of [place]. [facility] Vicci Barnie NOVAK, MD   7 months ago Black stools   [place] [facility] [place] - A Dept Of [place]. [facility] Vicci Barnie NOVAK, MD   10 months ago Need for hepatitis C screening test   [facility] - A Dept Of . [facility] Fleeta Tonia Garnette LITTIE, RPH-CPP       Future Appointments             In 2 weeks Vicci Barnie NOVAK, MD [facility] [place] - A Dept Of [place]. [facility]   In 1 month Vicci, Barnie NOVAK, MD [facility] Health Comm Health [place] - A Dept Of [place]. [facility]

## 2023-07-14 NOTE — Telephone Encounter (Signed)
 Call placed to patient, unable to reach. Message left   Message was to advise patient that in order to have her medications switched to [facility]  she will need to call my pharmacy and have them to call her previous pharmacy and request that they send all her medications there. Please advise patient when she returns call. Thank you.

## 2023-07-14 NOTE — Telephone Encounter (Signed)
 Medication Refill -  Most Recent Primary Care Visit:  Provider: VICCI SOBER B  Department: [facility]  Visit Type: OFFICE VISIT  Date: 05/14/2023  Medication: Pharmacy requesting all active medications be sent to them. The stated patient fell through the cracks and was enrolled with an online pharmacy that refuses to transfer the medication, but the patient prefers to pick it up at [facility].  Please advise.  Has the patient contacted their pharmacy? Yes  (Agent: If yes, when and what did the pharmacy advise?)  Is this the correct pharmacy for this prescription? Yes If no, delete pharmacy and type the correct one.  This is the patient's preferred pharmacy:    [facility] - [place], [place] - [address] [place] [address] Phone: [phone] Fax: [phone]    Has the prescription been filled recently? Yes  Is the patient out of the medication? Yes  Has the patient been seen for an appointment in the last year OR does the patient have an upcoming appointment? Yes  Can we respond through MyChart? No  Agent: Please be advised that Rx refills may take up to 3 business days. We ask that you follow-up with your pharmacy.

## 2023-07-15 ENCOUNTER — Ambulatory Visit: Payer: 59 | Attending: Orthopedic Surgery | Admitting: Physical Therapy

## 2023-07-15 ENCOUNTER — Encounter: Payer: Self-pay | Admitting: Physical Therapy

## 2023-07-15 DIAGNOSIS — R262 Difficulty in walking, not elsewhere classified: Secondary | ICD-10-CM | POA: Insufficient documentation

## 2023-07-15 DIAGNOSIS — R600 Localized edema: Secondary | ICD-10-CM | POA: Diagnosis not present

## 2023-07-15 DIAGNOSIS — G8929 Other chronic pain: Secondary | ICD-10-CM | POA: Diagnosis not present

## 2023-07-15 DIAGNOSIS — M25562 Pain in left knee: Secondary | ICD-10-CM | POA: Insufficient documentation

## 2023-07-15 MED ORDER — ATORVASTATIN CALCIUM 80 MG PO TABS: 80.0000 mg | ORAL_TABLET | Freq: Every day | ORAL | 0 refills | Status: DC

## 2023-07-15 MED ORDER — IRBESARTAN 75 MG PO TABS: 75.0000 mg | ORAL_TABLET | Freq: Every day | ORAL | 0 refills | Status: DC

## 2023-07-15 MED ORDER — PANTOPRAZOLE SODIUM 40 MG PO TBEC: 40.0000 mg | DELAYED_RELEASE_TABLET | Freq: Two times a day (BID) | ORAL | 2 refills | Status: DC

## 2023-07-15 MED ORDER — DICLOFENAC SODIUM 1 % EX GEL: 2.0000 g | Freq: Four times a day (QID) | CUTANEOUS | 1 refills | Status: DC

## 2023-07-15 NOTE — Telephone Encounter (Signed)
 Requested medication (s) are due for refill today - provider review  Requested medication (s) are on the active medication list -yes  Future visit scheduled -yes  Last refill: irbesartan - 03/13/23 #90 1RF- due March                 Atorvastatin - 03/16/23 #90 1RF- due March- fails lab protocol- over 1 year-03/20/21                  Diclofenac  gel- 05/14/23 100g 1RF- abnormal labs  Notes to clinic: see request above- patient is changing from mail order to local pharmacy  Requested Prescriptions  Pending Prescriptions Disp Refills   irbesartan  (AVAPRO ) 75 MG tablet 90 tablet 1    Sig: Take 1 tablet (75 mg total) by mouth daily.     Cardiovascular:  Angiotensin Receptor Blockers Failed - 07/15/2023  4:30 PM      Failed - Cr in normal range and within 180 days    Creatinine, Ser  Date Value Ref Range Status  06/25/2023 1.08 (H) 0.44 - 1.00 mg/dL Final         Failed - Last BP in normal range    BP Readings from Last 1 Encounters:  07/07/23 (!) 150/73         Passed - K in normal range and within 180 days    Potassium  Date Value Ref Range Status  06/25/2023 4.2 3.5 - 5.1 mmol/L Final         Passed - Patient is not pregnant      Passed - Valid encounter within last 6 months    Recent Outpatient Visits           2 months ago Primary osteoarthritis of left knee   [place] Comm Health Wellnss - A Dept Of [place]. [facility] Vicci Barnie NOVAK, MD   3 months ago Recurrent falls   [place] Comm Health [place] - A Dept Of [place]. [facility] Delbert Clam, MD   4 months ago Primary osteoarthritis of left knee   [place] Comm Health [place] - A Dept Of [place]. [facility] Vicci Barnie NOVAK, MD   7 months ago Black stools   [place] Comm Health [place] - A Dept Of [place]. [facility] Vicci Barnie NOVAK, MD   10 months ago Need for hepatitis C screening test   [facility] Health [facility] - A Dept Of . [facility] Fleeta Tonia Garnette LITTIE, RPH-CPP       Future Appointments             In 1 week Vicci Barnie NOVAK, MD [facility] Health Comm Health [place] - A Dept Of Jolynn DEL. [facility]   In 1 month Vicci, Barnie NOVAK, MD [facility] Health Comm Health [place] - A Dept Of Jolynn DEL. [facility]             diclofenac  Sodium (VOLTAREN ) 1 % GEL 100 g 1    Sig: Apply 2 g topically 4 (four) times daily.     Analgesics:  Topicals Failed - 07/15/2023  4:30 PM      Failed - Manual Review: Labs are only required if the patient has taken medication for more than 8 weeks.      Failed - HGB in normal range and within 360 days    Hemoglobin  Date Value Ref Range Status  06/25/2023 7.5 (L) 12.0 -  15.0 g/dL Final    Comment:    REPEATED TO VERIFY PATIENT HAD SURGERY   03/13/2023 10.4 (L) 11.1 - 15.9 g/dL Final         Failed - HCT in normal range and within 360 days    HCT  Date Value Ref Range Status  06/25/2023 22.3 (L) 36.0 - 46.0 % Final   Hematocrit  Date Value Ref Range Status  03/13/2023 31.6 (L) 34.0 - 46.6 % Final         Failed - Cr in normal range and within 360 days    Creatinine, Ser  Date Value Ref Range Status  06/25/2023 1.08 (H) 0.44 - 1.00 mg/dL Final         Passed - PLT in normal range and within 360 days    Platelets  Date Value Ref Range Status  06/25/2023 203 150 - 400 K/uL Final  03/13/2023 367 150 - 450 x10E3/uL Final         Passed - eGFR is 30 or above and within 360 days    GFR calc Af Amer  Date Value Ref Range Status  01/30/2020 75 >59 mL/min/1.73 Final    Comment:    **[facility] currently reports eGFR in compliance with the current**   recommendations of the [employer]. [facility] will   update reporting as new guidelines are published from the NKF-ASN   Task force.    GFR, Estimated  Date Value Ref Range Status  06/25/2023 56 (L) >60 mL/min Final    Comment:    (NOTE) Calculated using the CKD-EPI Creatinine  Equation (2021)    eGFR  Date Value Ref Range Status  03/13/2023 54 (L) >59 mL/min/1.73 Final         Passed - Patient is not pregnant      Passed - Valid encounter within last 12 months    Recent Outpatient Visits           2 months ago Primary osteoarthritis of left knee   [place] Comm Health Welln[facility] - A Dept Of [place]. [facility] Vicci Barnie NOVAK, MD   3 months ago Recurrent falls   [place] Comm Health [place] - A Dept Of [place]. [facility] Delbert Clam, MD   4 months ago Primary osteoarthritis of left knee   [place] Comm Health [place] - A Dept Of [place]. [facility] Vicci Barnie NOVAK, MD   7 months ago Black stools   [place] Comm Health [place] - A Dept Of [place]. [facility] Vicci Barnie NOVAK, MD   10 months ago Need for hepatitis C screening test   [facility] Health Comm Health [facility] - A Dept Of [place]. [facility] Fleeta Tonia Garnette LITTIE, RPH-CPP       Future Appointments             In 1 week Vicci Barnie NOVAK, MD [facility] Health Comm Health [place] - A Dept Of Jolynn DEL. [facility]   In 1 month Vicci, Barnie NOVAK, MD [facility] Health Comm Health [place] - A Dept Of Jolynn DEL. [facility]             atorvastatin  (LIPITOR ) 80 MG tablet 90 tablet 1    Sig: Take 1 tablet (80 mg total) by mouth daily.     Cardiovascular:  Antilipid - Statins Failed - 07/15/2023  4:30 PM      Failed - Lipid  Panel in normal range within the last 12 months    Cholesterol, Total  Date Value Ref Range Status  03/20/2021 155 100 - 199 mg/dL Final   LDL Chol Calc (NIH)  Date Value Ref Range Status  03/20/2021 35 0 - 99 mg/dL Final   HDL  Date Value Ref Range Status  03/20/2021 82 >39 mg/dL Final   Triglycerides  Date Value Ref Range Status  03/20/2021 258 (H) 0 - 149 mg/dL Final         Passed - Patient is not pregnant      Passed - Valid encounter within last 12 months    Recent  Outpatient Visits           2 months ago Primary osteoarthritis of left knee   [place] Comm Health Wellnss - A Dept Of [place]. [facility] Vicci Barnie NOVAK, MD   3 months ago Recurrent falls   [place] Comm Health [place] - A Dept Of [place]. [facility] Delbert Clam, MD   4 months ago Primary osteoarthritis of left knee   [place] Comm Health [place] - A Dept Of [place]. [facility] Vicci Barnie NOVAK, MD   7 months ago Black stools   [place] Comm Health [place] - A Dept Of [place]. [facility] Vicci Barnie NOVAK, MD   10 months ago Need for hepatitis C screening test   [facility] Health Comm Health [facility] - A Dept Of [place]. [facility] Fleeta Tonia Garnette LITTIE, RPH-CPP       Future Appointments             In 1 week Vicci Barnie NOVAK, MD [facility] Health Comm Health [place] - A Dept Of Jolynn DEL. [facility]   In 1 month Vicci, Barnie NOVAK, MD [facility] Health Comm Health [place] - A Dept Of Jolynn DEL. [facility]            Signed Prescriptions Disp Refills   pantoprazole  (PROTONIX ) 40 MG tablet 60 tablet 2    Sig: Take 1 tablet (40 mg total) by mouth 2 (two) times daily.     Gastroenterology: Proton Pump Inhibitors Passed - 07/15/2023  4:30 PM      Passed - Valid encounter within last 12 months    Recent Outpatient Visits           2 months ago Primary osteoarthritis of left knee   [place] Comm Health Wellnss - A Dept Of [place]. [facility] Vicci Barnie NOVAK, MD   3 months ago Recurrent falls   [place] Comm Health [place] - A Dept Of [place]. [facility] Delbert Clam, MD   4 months ago Primary osteoarthritis of left knee   [place] Comm Health [place] - A Dept Of [place]. [facility] Vicci Barnie NOVAK, MD   7 months ago Black stools   [place] Comm Health D[place] - A Dept Of [place]. [facility] Vicci Barnie NOVAK, MD   10 months  ago Need for hepatitis C screening test   [facility] Health Comm Health [facility] - A Dept Of . [facility] Fleeta Tonia Garnette LITTIE, RPH-CPP       Future Appointments             In 1 week Vicci Barnie NOVAK, MD [facility] Health Comm Health [place] - A Dept Of Jolynn DEL. [facility]  Hospital   In 1 month Vicci, Barnie NOVAK, MD [facility] Health Comm Health [place] - A Dept Of Jolynn DEL. [facility]            Refused Prescriptions Disp Refills   sertraline  (ZOLOFT ) 25 MG tablet 30 tablet 2    Sig: Take 1 tablet (25 mg total) by mouth daily.     Psychiatry:  Antidepressants - SSRI - sertraline  Passed - 07/15/2023  4:30 PM      Passed - AST in normal range and within 360 days    AST  Date Value Ref Range Status  06/23/2023 20 15 - 41 U/L Final         Passed - ALT in normal range and within 360 days    ALT  Date Value Ref Range Status  06/23/2023 16 0 - 44 U/L Final         Passed - Completed PHQ-2 or PHQ-9 in the last 360 days      Passed - Valid encounter within last 6 months    Recent Outpatient Visits           2 months ago Primary osteoarthritis of left knee   [place] Comm Health Wellnss - A Dept Of [place]. [facility] Vicci Barnie NOVAK, MD   3 months ago Recurrent falls   [place] Comm Health [place] - A Dept Of [place]. [facility] Delbert Clam, MD   4 months ago Primary osteoarthritis of left knee   [place] Comm Health [place] - A Dept Of [place]. [facility] Vicci Barnie NOVAK, MD   7 months ago Black stools   [place] Comm Health [place] - A Dept Of [place]. [facility] Vicci Barnie NOVAK, MD   10 months ago Need for hepatitis C screening test   [facility] Health Comm Health [facility] - A Dept Of [place]. [facility] Fleeta Tonia Garnette LITTIE, RPH-CPP       Future Appointments             In 1 week Vicci Barnie NOVAK, MD [facility] Health Comm Health [place] - A Dept Of Jolynn DEL. [facility]   In 1 month Vicci, Barnie NOVAK, MD [facility] Health Comm Health [place] - A Dept Of Jolynn DEL. [facility]               Requested Prescriptions  Pending Prescriptions Disp Refills   irbesartan  (AVAPRO ) 75 MG tablet 90 tablet 1    Sig: Take 1 tablet (75 mg total) by mouth daily.     Cardiovascular:  Angiotensin Receptor Blockers Failed - 07/15/2023  4:30 PM      Failed - Cr in normal range and within 180 days    Creatinine, Ser  Date Value Ref Range Status  06/25/2023 1.08 (H) 0.44 - 1.00 mg/dL Final         Failed - Last BP in normal range    BP Readings from Last 1 Encounters:  07/07/23 (!) 150/73         Passed - K in normal range and within 180 days    Potassium  Date Value Ref Range Status  06/25/2023 4.2 3.5 - 5.1 mmol/L Final         Passed - Patient is not pregnant      Passed - Valid encounter within last 6 months    Recent Outpatient Visits  2 months ago Primary osteoarthritis of left knee   [place] Comm Health [place] - A Dept Of [place]. [facility] Vicci Barnie NOVAK, MD   3 months ago Recurrent falls   [place] Comm Health [place] - A Dept Of [place]. [facility] Delbert Clam, MD   4 months ago Primary osteoarthritis of left knee   [place] Comm Health [place] - A Dept Of [place]. [facility] Vicci Barnie NOVAK, MD   7 months ago Black stools   [place] Comm Health [place] - A Dept Of [place]. [facility] Vicci Barnie NOVAK, MD   10 months ago Need for hepatitis C screening test   [facility] Health Comm Health [facility] - A Dept Of [place]. [facility] Fleeta Tonia Garnette LITTIE, RPH-CPP       Future Appointments             In 1 week Vicci Barnie NOVAK, MD [facility] Health Comm Health [place] - A Dept Of Jolynn DEL. [facility]   In 1 month Vicci, Barnie NOVAK, MD [facility] Health Comm Health [place] - A Dept Of Jolynn DEL. [facility]              diclofenac  Sodium (VOLTAREN ) 1 % GEL 100 g 1    Sig: Apply 2 g topically 4 (four) times daily.     Analgesics:  Topicals Failed - 07/15/2023  4:30 PM      Failed - Manual Review: Labs are only required if the patient has taken medication for more than 8 weeks.      Failed - HGB in normal range and within 360 days    Hemoglobin  Date Value Ref Range Status  06/25/2023 7.5 (L) 12.0 - 15.0 g/dL Final    Comment:    REPEATED TO VERIFY PATIENT HAD SURGERY   03/13/2023 10.4 (L) 11.1 - 15.9 g/dL Final         Failed - HCT in normal range and within 360 days    HCT  Date Value Ref Range Status  06/25/2023 22.3 (L) 36.0 - 46.0 % Final   Hematocrit  Date Value Ref Range Status  03/13/2023 31.6 (L) 34.0 - 46.6 % Final         Failed - Cr in normal range and within 360 days    Creatinine, Ser  Date Value Ref Range Status  06/25/2023 1.08 (H) 0.44 - 1.00 mg/dL Final         Passed - PLT in normal range and within 360 days    Platelets  Date Value Ref Range Status  06/25/2023 203 150 - 400 K/uL Final  03/13/2023 367 150 - 450 x10E3/uL Final         Passed - eGFR is 30 or above and within 360 days    GFR calc Af Amer  Date Value Ref Range Status  01/30/2020 75 >59 mL/min/1.73 Final    Comment:    **[facility] currently reports eGFR in compliance with the current**   recommendations of the [employer]. [facility] will   update reporting as new guidelines are published from the NKF-ASN   Task force.    GFR, Estimated  Date Value Ref Range Status  06/25/2023 56 (L) >60 mL/min Final    Comment:    (NOTE) Calculated using the CKD-EPI Creatinine Equation (2021)    eGFR  Date Value Ref Range Status  03/13/2023 54 (L) >59  mL/min/1.73 Final         Passed - Patient is not pregnant      Passed - Valid encounter within last 12 months    Recent Outpatient Visits           2 months ago Primary osteoarthritis of left knee   [place] Comm Health Wellnss - A Dept Of  [place]. [facility] Vicci Barnie NOVAK, MD   3 months ago Recurrent falls   [place] Comm Health [place] - A Dept Of [place]. [facility] Delbert Clam, MD   4 months ago Primary osteoarthritis of left knee   [place] Comm Health [place] - A Dept Of [place]. [facility] Vicci Barnie NOVAK, MD   7 months ago Black stools   [place] Comm Health [place] - A Dept Of [place]. [facility] Vicci Barnie NOVAK, MD   10 months ago Need for hepatitis C screening test   [facility] Health Comm Health [facility] - A Dept Of [place]. [facility] Fleeta Tonia Garnette LITTIE, RPH-CPP       Future Appointments             In 1 week Vicci Barnie NOVAK, MD [facility] Health Comm Health [place] - A Dept Of Jolynn DEL. [facility]   In 1 month Vicci, Barnie NOVAK, MD [facility] Health Comm Health [place] - A Dept Of Jolynn DEL. [facility]             atorvastatin  (LIPITOR ) 80 MG tablet 90 tablet 1    Sig: Take 1 tablet (80 mg total) by mouth daily.     Cardiovascular:  Antilipid - Statins Failed - 07/15/2023  4:30 PM      Failed - Lipid Panel in normal range within the last 12 months    Cholesterol, Total  Date Value Ref Range Status  03/20/2021 155 100 - 199 mg/dL Final   LDL Chol Calc (NIH)  Date Value Ref Range Status  03/20/2021 35 0 - 99 mg/dL Final   HDL  Date Value Ref Range Status  03/20/2021 82 >39 mg/dL Final   Triglycerides  Date Value Ref Range Status  03/20/2021 258 (H) 0 - 149 mg/dL Final         Passed - Patient is not pregnant      Passed - Valid encounter within last 12 months    Recent Outpatient Visits           2 months ago Primary osteoarthritis of left knee   [place] Comm Health Wellnss - A Dept Of [place]. [facility] Vicci Barnie NOVAK, MD   3 months ago Recurrent falls   [place] Comm Health [place] - A Dept Of [place]. [facility] Delbert Clam, MD   4 months ago Primary  osteoarthritis of left knee   [place] Comm Health [place] - A Dept Of [place]. [facility] Vicci Barnie NOVAK, MD   7 months ago Black stools   [place] Comm Health [place] - A Dept Of [place]. [facility] Vicci Barnie NOVAK, MD   10 months ago Need for hepatitis C screening test   [facility] Health Comm Health [facility] - A Dept Of [place]. [facility] Fleeta Tonia Garnette LITTIE, RPH-CPP       Future Appointments             In 1 week  Vicci Barnie NOVAK, MD [place] Comm Health [place] - A Dept Of Jolynn DEL. [facility]   In 1 month Vicci, Barnie NOVAK, MD [facility] Health Comm Health [place] - A Dept Of Jolynn DEL. [facility]            Signed Prescriptions Disp Refills   pantoprazole  (PROTONIX ) 40 MG tablet 60 tablet 2    Sig: Take 1 tablet (40 mg total) by mouth 2 (two) times daily.     Gastroenterology: Proton Pump Inhibitors Passed - 07/15/2023  4:30 PM      Passed - Valid encounter within last 12 months    Recent Outpatient Visits           2 months ago Primary osteoarthritis of left knee   [place] Comm Health Wellnss - A Dept Of [place]. [facility] Vicci Barnie NOVAK, MD   3 months ago Recurrent falls   [place] Comm Health [place] - A Dept Of [place]. [facility] Delbert Clam, MD   4 months ago Primary osteoarthritis of left knee   [place] Comm Health [place] - A Dept Of [place]. [facility] Vicci Barnie NOVAK, MD   7 months ago Black stools   [place] Comm Health [place] - A Dept Of [place]. [facility] Vicci Barnie NOVAK, MD   10 months ago Need for hepatitis C screening test   [facility] Health Comm Health [facility] - A Dept Of [place]. [facility] Fleeta Tonia Garnette LITTIE, RPH-CPP       Future Appointments             In 1 week Vicci Barnie NOVAK, MD [facility] Health Comm Health [place] - A Dept Of Jolynn DEL. [facility]   In 1 month Vicci, Barnie NOVAK, MD [facility] Health Comm Health [place] - A Dept Of Jolynn DEL. [facility]            Refused Prescriptions Disp Refills   sertraline  (ZOLOFT ) 25 MG tablet 30 tablet 2    Sig: Take 1 tablet (25 mg total) by mouth daily.     Psychiatry:  Antidepressants - SSRI - sertraline  Passed - 07/15/2023  4:30 PM      Passed - AST in normal range and within 360 days    AST  Date Value Ref Range Status  06/23/2023 20 15 - 41 U/L Final         Passed - ALT in normal range and within 360 days    ALT  Date Value Ref Range Status  06/23/2023 16 0 - 44 U/L Final         Passed - Completed PHQ-2 or PHQ-9 in the last 360 days      Passed - Valid encounter within last 6 months    Recent Outpatient Visits           2 months ago Primary osteoarthritis of left knee   [place] Comm Health Wellnss - A Dept Of [place]. [facility] Vicci Barnie NOVAK, MD   3 months ago Recurrent falls   [place] Comm Health [place] Center - A Dept Of [place]. [facility] Delbert Clam, MD   4 months ago Primary osteoarthritis of left knee   [place] Comm Health [place] - A Dept Of . [facility] Vicci Barnie NOVAK, MD   7 months ago Black stools     Comm Health Boeing - A Dept Of [place]. [facility] Vicci Barnie NOVAK, MD   10 months ago Need for hepatitis C screening test   [facility] Health Comm Health [facility] - A Dept Of [place]. [facility] Fleeta Tonia Garnette LITTIE, RPH-CPP       Future Appointments             In 1 week Vicci Barnie NOVAK, MD [facility] Health Comm Health [place] - A Dept Of Jolynn DEL. [facility]   In 1 month Vicci, Barnie NOVAK, MD [facility] Health Comm Health [place] - A Dept Of Jolynn DEL. [facility]

## 2023-07-15 NOTE — Telephone Encounter (Signed)
 Call to pharmacy- patient is changing from mail order back to local- they do not have anything on file but the sertraline  that was sent today. Remainder of pantoprazole  sent-it is due today. Requested Prescriptions  Pending Prescriptions Disp Refills   pantoprazole  (PROTONIX ) 40 MG tablet 60 tablet 4    Sig: Take 1 tablet (40 mg total) by mouth 2 (two) times daily.     Gastroenterology: Proton Pump Inhibitors Passed - 07/15/2023  4:29 PM      Passed - Valid encounter within last 12 months    Recent Outpatient Visits           2 months ago Primary osteoarthritis of left knee   [place] [facility] - A Dept Of [place]. [facility] Vicci Barnie NOVAK, MD   3 months ago Recurrent falls   [place] [facility] [place] - A Dept Of [place]. [facility] Delbert Clam, MD   4 months ago Primary osteoarthritis of left knee   [place] [facility] [place] - A Dept Of [place]. [facility] Vicci Barnie NOVAK, MD   7 months ago Black stools   [place] [facility] [place] - A Dept Of [place]. [facility] Vicci Barnie NOVAK, MD   10 months ago Need for hepatitis C screening test   [facility] - A Dept Of [place]. [facility] Fleeta Tonia Garnette LITTIE, RPH-CPP       Future Appointments             In 1 week Vicci Barnie NOVAK, MD [facility] Health [facility] Health [place] - A Dept Of [place] DEL. [facility]   In 1 month Vicci, Barnie NOVAK, MD [facility] Health Comm Health [place] - A Dept Of [place]. [facility]             irbesartan  (AVAPRO ) 75 MG tablet 90 tablet 1    Sig: Take 1 tablet (75 mg total) by mouth daily.     Cardiovascular:  Angiotensin Receptor Blockers Failed - 07/15/2023  4:29 PM      Failed - Cr in normal range and within 180 days    Creatinine, Ser  Date Value Ref Range Status  06/25/2023 1.08 (H) 0.44 - 1.00 mg/dL Final         Failed - Last BP in normal range    BP Readings  from Last 1 Encounters:  07/07/23 (!) 150/73         Passed - K in normal range and within 180 days    Potassium  Date Value Ref Range Status  06/25/2023 4.2 3.5 - 5.1 mmol/L Final         Passed - Patient is not pregnant      Passed - Valid encounter within last 6 months    Recent Outpatient Visits           2 months ago Primary osteoarthritis of left knee   [place] [facility] - A Dept Of [place]. [facility] Vicci Barnie NOVAK, MD   3 months ago Recurrent falls   [place] [facility] [place] - A Dept Of [place]. [facility] Delbert Clam, MD   4 months ago Primary osteoarthritis of left knee    Comm Health [place] - A Dept Of [place]. [facility] Vicci Barnie NOVAK, MD   7 months ago Black stools  [place] [facility] [place] - A Dept Of [place]. [facility] Vicci Barnie NOVAK, MD   10 months ago Need for hepatitis C screening test   [facility] - A Dept Of [place]. [facility] Fleeta Tonia Garnette LITTIE, RPH-CPP       Future Appointments             In 1 week Vicci Barnie NOVAK, MD [facility] Health Comm Health [place] - A Dept Of [place] DEL. [facility]   In 1 month Vicci, Barnie NOVAK, MD [facility] Comm Health [place] - A Dept Of [place]. [facility]             sertraline  (ZOLOFT ) 25 MG tablet 30 tablet 2    Sig: Take 1 tablet (25 mg total) by mouth daily.     Psychiatry:  Antidepressants - SSRI - sertraline  Passed - 07/15/2023  4:29 PM      Passed - AST in normal range and within 360 days    AST  Date Value Ref Range Status  06/23/2023 20 15 - 41 U/L Final         Passed - ALT in normal range and within 360 days    ALT  Date Value Ref Range Status  06/23/2023 16 0 - 44 U/L Final         Passed - Completed PHQ-2 or PHQ-9 in the last 360 days      Passed - Valid encounter within last 6 months    Recent Outpatient Visits           2  months ago Primary osteoarthritis of left knee   [place] [facility] - A Dept Of [place]. [facility] Vicci Barnie NOVAK, MD   3 months ago Recurrent falls   [place] [facility] [place] - A Dept Of [place]. [facility] Delbert Clam, MD   4 months ago Primary osteoarthritis of left knee   [place] [facility] [place] - A Dept Of [place]. [facility] Vicci Barnie NOVAK, MD   7 months ago Black stools   [place] Comm [facility] [place] - A Dept Of [place]. [facility] Vicci Barnie NOVAK, MD   10 months ago Need for hepatitis C screening test   [facility] - A Dept Of [place]. [facility] Fleeta Tonia Garnette LITTIE, RPH-CPP       Future Appointments             In 1 week Vicci Barnie NOVAK, MD [facility] Comm Health [place] - A Dept Of [place] DEL. [facility]   In 1 month Vicci, Barnie NOVAK, MD [facility] Health Comm Health [place] - A Dept Of [place] DEL. [facility]             diclofenac  Sodium (VOLTAREN ) 1 % GEL 100 g 1    Sig: Apply 2 g topically 4 (four) times daily.     Analgesics:  Topicals Failed - 07/15/2023  4:29 PM      Failed - Manual Review: Labs are only required if the patient has taken medication for more than 8 weeks.      Failed - HGB in normal range and within 360 days    Hemoglobin  Date Value Ref Range Status  06/25/2023 7.5 (L) 12.0 - 15.0 g/dL Final    Comment:  REPEATED TO VERIFY PATIENT HAD SURGERY   03/13/2023 10.4 (L) 11.1 - 15.9 g/dL Final         Failed - HCT in normal range and within 360 days    HCT  Date Value Ref Range Status  06/25/2023 22.3 (L) 36.0 - 46.0 % Final   Hematocrit  Date Value Ref Range Status  03/13/2023 31.6 (L) 34.0 - 46.6 % Final         Failed - Cr in normal range and within 360 days    Creatinine, Ser  Date Value Ref Range Status  06/25/2023 1.08 (H) 0.44 - 1.00 mg/dL Final         Passed - PLT in normal range  and within 360 days    Platelets  Date Value Ref Range Status  06/25/2023 203 150 - 400 K/uL Final  03/13/2023 367 150 - 450 x10E3/uL Final         Passed - eGFR is 30 or above and within 360 days    GFR calc Af Amer  Date Value Ref Range Status  01/30/2020 75 >59 mL/min/1.73 Final    Comment:    **[facility] currently reports eGFR in compliance with the current**   recommendations of the [employer]. [facility] will   update reporting as new guidelines are published from the NKF-ASN   Task force.    GFR, Estimated  Date Value Ref Range Status  06/25/2023 56 (L) >60 mL/min Final    Comment:    (NOTE) Calculated using the CKD-EPI Creatinine Equation (2021)    eGFR  Date Value Ref Range Status  03/13/2023 54 (L) >59 mL/min/1.73 Final         Passed - Patient is not pregnant      Passed - Valid encounter within last 12 months    Recent Outpatient Visits           2 months ago Primary osteoarthritis of left knee   [place] [facility] - A Dept Of [place]. [facility] Vicci Barnie NOVAK, MD   3 months ago Recurrent falls   [place] [facility] [place] - A Dept Of [place]. [facility] Delbert Clam, MD   4 months ago Primary osteoarthritis of left knee   [place] [facility] [place] - A Dept Of [place]. [facility] Vicci Barnie NOVAK, MD   7 months ago Black stools   [place] Comm [facility] [place] - A Dept Of [place]. [facility] Vicci Barnie NOVAK, MD   10 months ago Need for hepatitis C screening test   [facility] - A Dept Of [place]. [facility] Fleeta Tonia Garnette LITTIE, RPH-CPP       Future Appointments             In 1 week Vicci Barnie NOVAK, MD [facility] Health Comm Health [place] - A Dept Of [place] DEL. [facility]   In 1 month Vicci, Barnie NOVAK, MD [facility] Health Comm Health [place] - A Dept Of [place] DEL. [facility]             atorvastatin   (LIPITOR ) 80 MG tablet 90 tablet 1    Sig: Take 1 tablet (80 mg total) by mouth daily.     Cardiovascular:  Antilipid - Statins Failed - 07/15/2023  4:29 PM      Failed - Lipid Panel in normal range within the last 12 months  Cholesterol, Total  Date Value Ref Range Status  03/20/2021 155 100 - 199 mg/dL Final   LDL Chol Calc (NIH)  Date Value Ref Range Status  03/20/2021 35 0 - 99 mg/dL Final   HDL  Date Value Ref Range Status  03/20/2021 82 >39 mg/dL Final   Triglycerides  Date Value Ref Range Status  03/20/2021 258 (H) 0 - 149 mg/dL Final         Passed - Patient is not pregnant      Passed - Valid encounter within last 12 months    Recent Outpatient Visits           2 months ago Primary osteoarthritis of left knee   [place] [facility] - A Dept Of [place]. [facility] Vicci Barnie NOVAK, MD   3 months ago Recurrent falls   [place] Comm Health [place] - A Dept Of [place]. [facility] Delbert Clam, MD   4 months ago Primary osteoarthritis of left knee   [place] Comm Health [place] - A Dept Of [place]. [facility] Vicci Barnie NOVAK, MD   7 months ago Black stools    [facility] [place] - A Dept Of [place]. [facility] Vicci Barnie NOVAK, MD   10 months ago Need for hepatitis C screening test   [facility] Health [facility] - A Dept Of [place]. [facility] Fleeta Tonia Garnette LITTIE, RPH-CPP       Future Appointments             In 1 week Vicci Barnie NOVAK, MD [facility] Health Comm Health [place] - A Dept Of [place]. [facility]   In 1 month Vicci, Barnie NOVAK, MD [facility] Health Comm Health [place] - A Dept Of [place] DEL. [facility]

## 2023-07-15 NOTE — Therapy (Signed)
 Daily Note   Patient Name: Anna Mcguire MRN: [id number] DOB:February 26, 1956, 68 y.o., female Today's Date: 07/15/2023  END OF SESSION:  PT End of Session - 07/15/23 1305     Visit Number 3    Number of Visits 17    Date for PT Re-Evaluation 08/24/23    Authorization Type UHC MCR/MCD secondary    PT Start Time 1304    PT Stop Time 1344    PT Time Calculation (min) 40 min    Activity Tolerance Patient limited by pain             Past Medical History:  Diagnosis Date   Acid reflux    Anxiety    Arthritis    COPD (chronic obstructive pulmonary disease) (HCC)    Depression    Dyspnea    Hepatitis    Hypertension    Past Surgical History:  Procedure Laterality Date   COLONOSCOPY WITH PROPOFOL  N/A 06/07/2014   Procedure: COLONOSCOPY WITH PROPOFOL ;  Surgeon: Elsie Cree, MD;  Location: [facility];  Service: Endoscopy;  Laterality: N/A;   ESOPHAGOGASTRODUODENOSCOPY (EGD) WITH PROPOFOL  N/A 06/07/2014   Procedure: ESOPHAGOGASTRODUODENOSCOPY (EGD) WITH PROPOFOL ;  Surgeon: Elsie Cree, MD;  Location: [facility];  Service: Endoscopy;  Laterality: N/A;   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   Procedure: TOTAL KNEE ARTHROPLASTY;  Surgeon: Edna Toribio LABOR, MD;  Location: [facility];  Service: Orthopedics;  Laterality: Left;   Patient Active Problem List   Diagnosis Date Noted   Primary osteoarthritis of left knee 06/24/2023   Normocytic anemia 04/08/2023   Loss of weight 04/08/2023   Vomiting without nausea 04/08/2023   Altered bowel habits 04/08/2023   Primary hyperparathyroidism (HCC) 03/26/2023   Arthritis of carpometacarpal (CMC) joint of left thumb 04/23/2022   Pain in left shoulder 04/23/2022   Hypercalcemia 12/31/2021   Stage 3b chronic kidney disease (HCC) 12/31/2021   Allergic rhinitis 10/24/2020   Rotator cuff arthropathy of left shoulder 08/07/2020   Cervicalgia 08/07/2020   Chronic cough 03/21/2020   Alcohol use disorder, moderate, dependence  (HCC) 01/30/2020   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   Tobacco dependence 01/30/2020   Diarrhea of infectious origin 01/30/2020   Hyponatremia 01/13/2020   Alcohol abuse 01/13/2020   Tobacco abuse 01/13/2020   Chronic diarrhea 01/13/2020   Hypophosphatemia 01/13/2020   Hypertension    Chronic obstructive pulmonary disease (HCC)    Alcohol use    Hypokalemia 01/12/2020   Tobacco use 10/06/2019   Unilateral primary osteoarthritis, left knee 09/08/2019   Pain in right shoulder 08/11/2019    PCP: Vicci Barnie NOVAK, MD  REFERRING PROVIDER: Edna Toribio LABOR, MD  REFERRING DIAG: L TKA 06/24/23  THERAPY DIAG:  Chronic pain of left knee  Difficulty in walking, not elsewhere classified  Localized edema  Rationale for Evaluation and Treatment: Rehabilitation  ONSET DATE: 06/24/23 DOS    SUBJECTIVE:   SUBJECTIVE STATEMENT:  Pt reports that she went to urgent care and her incision looked good.  She has had the dressing removed.  She states she bumped her knee into the wall yesterday and it is sore as a result.   PERTINENT HISTORY: Relevant PMHx includes anxiety, arthritis, COPD, depression, dyspnea on exertion, hypertension, hyperparathyroidism, stage 3b CKD, history of tobacco and alcohol use PAIN:  Are you having pain?  Yes: NPRS scale: 8/10 current & at worst Pain location: L knee  Pain description: aching  Aggravating factors: movement  Relieving factors: pain medicine  PRECAUTIONS: Knee and Fall, ACTIVE BED BUGS  RED FLAGS: None   WEIGHT BEARING RESTRICTIONS: No  FALLS:  Has patient fallen in last 6 months? Yes. Number of falls 2 since date of surgery, reports several prior to surgery  LIVING ENVIRONMENT: Lives with: lives with their family and lives alone Stairs: No Has following equipment at home: Vannie - 2 wheeled and Environmental Consultant - 4 wheeled  OCCUPATION: N/A  PLOF: Independent  PATIENT GOALS: To decrease pain, improve  independence  NEXT MD VISIT: 07/28/2023  OBJECTIVE:  Note: Objective measures were completed at Evaluation unless otherwise noted.  DIAGNOSTIC FINDINGS:   06/24/23  FINDINGS: Interval total left knee arthroplasty. No perihardware lucency is seen to indicate hardware failure or loosening. Postoperative changes of intra-articular air and subcutaneous air. Tiny joint effusion. No acute fracture or dislocation.   IMPRESSION: Interval total left knee arthroplasty without evidence of hardware failure.  PATIENT SURVEYS:  FOTO 23 current, 50 predicted  COGNITION: Overall cognitive status: Within functional limits for tasks assessed     SENSATION: Not tested  POSTURE: flexed trunk  and weight shift right  PALPATION: Min-to-moderate TTP surrounding surgical site; moderate TTP around L hip   LOWER EXTREMITY ROM:  Active ROM Right eval Left eval 12/31 1/8  Hip flexion      Hip extension      Hip abduction      Hip adduction      Hip internal rotation      Hip external rotation      Knee flexion  90 95 112 degrees   Knee extension  -8 Lacking 6 PROM   Ankle dorsiflexion      Ankle plantarflexion      Ankle inversion      Ankle eversion       (Blank rows = not tested)  LOWER EXTREMITY MMT:  MMT Right eval Left eval  Hip flexion    Hip extension    Hip abduction    Hip adduction    Hip internal rotation    Hip external rotation    Knee flexion  2-  Knee extension  2-  Ankle dorsiflexion    Ankle plantarflexion    Ankle inversion    Ankle eversion     (Blank rows = not tested)   FUNCTIONAL TESTS:  30 seconds chair stand test: to be assessed  2 minute walk test: To be assessed  GAIT: Distance walked: 0 ft Assistive device utilized: Wheelchair (manual) Level of assistance: Mod A for transfers  Comments: unable to transfer with Mod I, limited ambulation tolerance with AD d/t L knee and hip pain                                                                                                                                  TREATMENT DATE:   [facility] Adult PT Treatment  07/15/2023:  Therapeutic Exercise:  nu-step L3 58m while taking subjective  and planning session with patient Quad set for ext with PT OP SAQ - 3x10 - cuing for form SLR - 3x10 LAQ - 3x10  Manual Therapy  Tailgate AP and PA mobs with distraction Knee ext mob in supine  [facility] Adult PT Treatment:                                                DATE: 06/29/2023  Initial evaluation: see patient education and home exercise program as noted below      PATIENT EDUCATION:  Education details: reviewed initial home exercise program; discussion of POC, prognosis and goals for skilled PT   Person educated: Patient and Sister Education method: Explanation, Demonstration, and Handouts Education comprehension: verbalized understanding, returned demonstration, and needs further education  HOME EXERCISE PROGRAM: Access Code: [email or web address] URL: [email or web address] Date: 07/07/2023 Prepared by: Helene Gasmen  Exercises - Supine Ankle Pumps  - 8 x daily - 7 x weekly - 2 sets - 10 reps - Supine Heel Slide with Strap  - 1 x daily - 7 x weekly - 3 sets - 10 reps - Seated Hamstring Stretch  - 2 x daily - 7 x weekly - 1 sets - 3 reps - 45 hold - Long Sitting 4 Way Patellar Glide  - 4 x daily - 7 x weekly - 3 sets - 10 reps - Supine Quad Set  - 5 x daily - 7 x weekly - 2 sets - 10 reps - 10 hold - Small Range Straight Leg Raise  - 1 x daily - 7 x weekly - 3 sets - 10 reps - Ice  - 5 x daily - 7 x weekly - 1 sets - 1 reps - 20 min hold  ASSESSMENT:  CLINICAL IMPRESSION: Pt with excellent progress toward flexion goal today with >110 degrees flexion easily.  We will concentrate on getting full ext in the next few visits.  Pt does have evidence of active bed bugs today.  Significant L quad atrophy, but with better activation today.   OBJECTIVE IMPAIRMENTS: Abnormal gait,  decreased activity tolerance, decreased balance, decreased endurance, difficulty walking, decreased ROM, decreased strength, decreased safety awareness, and pain.   ACTIVITY LIMITATIONS: carrying, lifting, bending, standing, squatting, sleeping, stairs, transfers, bed mobility, bathing, toileting, dressing, hygiene/grooming, and locomotion level  PARTICIPATION LIMITATIONS: meal prep, cleaning, laundry, interpersonal relationship, shopping, and community activity  PERSONAL FACTORS: Behavior pattern, Past/current experiences, and 3+ comorbidities: Relevant PMHx includes anxiety, arthritis, COPD, depression, dyspnea on exertion, hypertension, hyperparathyroidism, stage 3b CKD, history of tobacco and alcohol use  are also affecting patient's functional outcome.   REHAB POTENTIAL: Fair    CLINICAL DECISION MAKING: Evolving/moderate complexity  EVALUATION COMPLEXITY: Moderate   GOALS: Goals reviewed with patient? Yes  SHORT TERM GOALS: Target date: 07/27/2023   Patient will be independent with initial home program for knee ROM and strengthening as appropriate to post op status .  Baseline: reviewed at eval  Goal status: INITIAL  2.  Patient will demonstrate improved standing tolerance using LRAD and upright posture.  Baseline: unable to stand d/t pain Goal status: INITIAL   3.  Patient will demonstrate at least Mod I with stand-pivot transfers and safe mechanics.  Baseline: requiring min-mod A with pivot transfer from w/c to treatament table Goal status: INITIAL   LONG TERM GOALS: Target date: 08/23/2022  Patient will report  improved overall functional ability with FOTO score of 50 or greater  Baseline: 23 Goal status: INITIAL  2.  Patient will demonstrate at least 120 deg knee flexion AROM and 0 degrees of knee extension AROM.  Baseline: see objective measures  Goal status: INITIAL  3.  Patient will demonstrate ability to safely ambulate at least 150 ft during 2 minute walk test  and use of LRAD.  Baseline: unable to tolerate ambulation  Goal status: INITIAL  4.  Patient will demonstrate at least 4/5 MMT with left LE resisted testing Baseline: see objective measures  Goal status: INITIAL  5.  Patient will report ability to perform all ADLs/IADLs with <4/10 pain severity, using activity modifications and rest breaks as appropriate.  Baseline: limited by pain  Goal status: INITIAL    PLAN:  PT FREQUENCY: 2x/week  PT DURATION: 8 weeks  PLANNED INTERVENTIONS: [id number]- PT Re-evaluation, 97110-Therapeutic exercises, 97530- Therapeutic activity, 97112- Neuromuscular re-education, 97535- Self Care, [id number]- Manual therapy, [phone]- Gait training, [phone]- Aquatic Therapy, [phone]- Vasopneumatic device, Patient/Family education, Balance training, Stair training, Taping, Cryotherapy, and Moist heat  PLAN FOR NEXT SESSION: Left knee ROM, lower extremity strengthening as appropriate for postop status, aerobic activity for AAROM and pain management, transfer training including bed mobility, assess 30sec sit to stand, 2-minute walk test    Helene FORBES Gasmen PT 07/15/2023, 3:14 PM    Date of referral: 06/03/2023 Referring provider: Edna Toribio LABOR, MD Referring diagnosis? Left Total Knee Arthroplasty  Treatment diagnosis? (if different than referring diagnosis) Chronic pain of left knee Difficulty in walking, not elsewhere classified Localized edema  What was this (referring dx) caused by? Surgery (Type: total knee arthroplasty)  Lysle of Condition: Initial Onset (within last 3 months)   Laterality: Lt  Current Functional Measure Score: FOTO 23% functional  Objective measurements identify impairments when they are compared to normal values, the uninvolved extremity, and prior level of function.  [x]  Yes  []  No  Objective assessment of functional ability: Severe functional limitations   Briefly describe symptoms: left knee pain, decreased mobility, decreased  strength, localized edema, left leg pain   How did symptoms start: surgery   Average pain intensity:  Last 24 hours: 8/10  Past week: 10/10  How often does the pt experience symptoms? Constantly  How much have the symptoms interfered with usual daily activities? Extremely  How has condition changed since care began at this facility? NA - initial visit  In general, how is the patients overall health? Fair   BACK PAIN (STarT Back Screening Tool) No

## 2023-07-17 ENCOUNTER — Ambulatory Visit: Payer: 59

## 2023-07-17 ENCOUNTER — Other Ambulatory Visit: Payer: Self-pay | Admitting: Internal Medicine

## 2023-07-17 DIAGNOSIS — J449 Chronic obstructive pulmonary disease, unspecified: Secondary | ICD-10-CM

## 2023-07-21 ENCOUNTER — Ambulatory Visit: Payer: 59 | Admitting: Physical Therapy

## 2023-07-21 ENCOUNTER — Encounter: Payer: Self-pay | Admitting: Physical Therapy

## 2023-07-21 DIAGNOSIS — R600 Localized edema: Secondary | ICD-10-CM | POA: Diagnosis not present

## 2023-07-21 DIAGNOSIS — R262 Difficulty in walking, not elsewhere classified: Secondary | ICD-10-CM | POA: Diagnosis not present

## 2023-07-21 DIAGNOSIS — G8929 Other chronic pain: Secondary | ICD-10-CM | POA: Diagnosis not present

## 2023-07-21 DIAGNOSIS — M25562 Pain in left knee: Secondary | ICD-10-CM | POA: Diagnosis not present

## 2023-07-21 NOTE — Therapy (Signed)
 Daily Note   Patient Name: Anna Mcguire MRN: [id number] DOB:1956-01-13, 68 y.o., female Today's Date: 07/21/2023  END OF SESSION:  PT End of Session - 07/21/23 1304     Visit Number 4    Number of Visits 17    Date for PT Re-Evaluation 08/24/23    Authorization Type UHC MCR/MCD secondary    PT Start Time 1303    PT Stop Time 1343    PT Time Calculation (min) 40 min    Activity Tolerance Patient limited by pain             Past Medical History:  Diagnosis Date   Acid reflux    Anxiety    Arthritis    COPD (chronic obstructive pulmonary disease) (HCC)    Depression    Dyspnea    Hepatitis    Hypertension    Past Surgical History:  Procedure Laterality Date   COLONOSCOPY WITH PROPOFOL  N/A 06/07/2014   Procedure: COLONOSCOPY WITH PROPOFOL ;  Surgeon: Elsie Cree, MD;  Location: [facility];  Service: Endoscopy;  Laterality: N/A;   ESOPHAGOGASTRODUODENOSCOPY (EGD) WITH PROPOFOL  N/A 06/07/2014   Procedure: ESOPHAGOGASTRODUODENOSCOPY (EGD) WITH PROPOFOL ;  Surgeon: Elsie Cree, MD;  Location: [facility];  Service: Endoscopy;  Laterality: N/A;   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   Procedure: TOTAL KNEE ARTHROPLASTY;  Surgeon: Edna Toribio LABOR, MD;  Location: [facility];  Service: Orthopedics;  Laterality: Left;   Patient Active Problem List   Diagnosis Date Noted   Primary osteoarthritis of left knee 06/24/2023   Normocytic anemia 04/08/2023   Loss of weight 04/08/2023   Vomiting without nausea 04/08/2023   Altered bowel habits 04/08/2023   Primary hyperparathyroidism (HCC) 03/26/2023   Arthritis of carpometacarpal (CMC) joint of left thumb 04/23/2022   Pain in left shoulder 04/23/2022   Hypercalcemia 12/31/2021   Stage 3b chronic kidney disease (HCC) 12/31/2021   Allergic rhinitis 10/24/2020   Rotator cuff arthropathy of left shoulder 08/07/2020   Cervicalgia 08/07/2020   Chronic cough 03/21/2020   Alcohol use disorder, moderate, dependence  (HCC) 01/30/2020   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   Tobacco dependence 01/30/2020   Diarrhea of infectious origin 01/30/2020   Hyponatremia 01/13/2020   Alcohol abuse 01/13/2020   Tobacco abuse 01/13/2020   Chronic diarrhea 01/13/2020   Hypophosphatemia 01/13/2020   Hypertension    Chronic obstructive pulmonary disease (HCC)    Alcohol use    Hypokalemia 01/12/2020   Tobacco use 10/06/2019   Unilateral primary osteoarthritis, left knee 09/08/2019   Pain in right shoulder 08/11/2019    PCP: Vicci Barnie NOVAK, MD  REFERRING PROVIDER: Edna Toribio LABOR, MD  REFERRING DIAG: L TKA 06/24/23  THERAPY DIAG:  Chronic pain of left knee  Difficulty in walking, not elsewhere classified  Localized edema  Rationale for Evaluation and Treatment: Rehabilitation  ONSET DATE: 06/24/23 DOS    SUBJECTIVE:   SUBJECTIVE STATEMENT:  Pt reports that she had a fall out of bed last Wednesday.  She states that she does not feel like she hurt herself when she fell.     PERTINENT HISTORY: Relevant PMHx includes anxiety, arthritis, COPD, depression, dyspnea on exertion, hypertension, hyperparathyroidism, stage 3b CKD, history of tobacco and alcohol use PAIN:  Are you having pain?  Yes: NPRS scale: 8/10 current & at worst Pain location: L knee  Pain description: aching  Aggravating factors: movement  Relieving factors: pain medicine   PRECAUTIONS: Knee and Fall, ACTIVE BED BUGS  RED  FLAGS: None   WEIGHT BEARING RESTRICTIONS: No  FALLS:  Has patient fallen in last 6 months? Yes. Number of falls 2 since date of surgery, reports several prior to surgery  LIVING ENVIRONMENT: Lives with: lives with their family and lives alone Stairs: No Has following equipment at home: Vannie - 2 wheeled and Environmental Consultant - 4 wheeled  OCCUPATION: N/A  PLOF: Independent  PATIENT GOALS: To decrease pain, improve independence  NEXT MD VISIT: 07/28/2023  OBJECTIVE:   Note: Objective measures were completed at Evaluation unless otherwise noted.  DIAGNOSTIC FINDINGS:   06/24/23  FINDINGS: Interval total left knee arthroplasty. No perihardware lucency is seen to indicate hardware failure or loosening. Postoperative changes of intra-articular air and subcutaneous air. Tiny joint effusion. No acute fracture or dislocation.   IMPRESSION: Interval total left knee arthroplasty without evidence of hardware failure.  PATIENT SURVEYS:  FOTO 23 current, 50 predicted  COGNITION: Overall cognitive status: Within functional limits for tasks assessed     SENSATION: Not tested  POSTURE: flexed trunk  and weight shift right  PALPATION: Min-to-moderate TTP surrounding surgical site; moderate TTP around L hip   LOWER EXTREMITY ROM:  Active ROM Right eval Left eval 12/31 1/8 1/14  Hip flexion       Hip extension       Hip abduction       Hip adduction       Hip internal rotation       Hip external rotation       Knee flexion  90 95 112 degrees    Knee extension  -8 Lacking 6 PROM  Lacking 8 PROM  Ankle dorsiflexion       Ankle plantarflexion       Ankle inversion       Ankle eversion        (Blank rows = not tested)  LOWER EXTREMITY MMT:  MMT Right eval Left eval  Hip flexion    Hip extension    Hip abduction    Hip adduction    Hip internal rotation    Hip external rotation    Knee flexion  2-  Knee extension  2-  Ankle dorsiflexion    Ankle plantarflexion    Ankle inversion    Ankle eversion     (Blank rows = not tested)   FUNCTIONAL TESTS:  30 seconds chair stand test: to be assessed  2 minute walk test: To be assessed  GAIT: Distance walked: 0 ft Assistive device utilized: Wheelchair (manual) Level of assistance: Mod A for transfers  Comments: unable to transfer with Mod I, limited ambulation tolerance with AD d/t L knee and hip pain                                                                                                                                  TREATMENT DATE:   [facility] Adult PT Treatment  07/21/2023 :  Therapeutic Exercise:  nu-step L3 39m while taking subjective and planning session with patient Quad set for ext with PT OP SAQ - 3x10 5'' hold - cuing for form SLR - 5x5 LAQ - 3x10 TKE - YTB - 20x  Manual Therapy  Knee ext mob in supine   PATIENT EDUCATION:  Education details: reviewed initial home exercise program; discussion of POC, prognosis and goals for skilled PT   Person educated: Patient and Sister Education method: Explanation, Demonstration, and Handouts Education comprehension: verbalized understanding, returned demonstration, and needs further education  HOME EXERCISE PROGRAM: Access Code: [email or web address] URL: [email or web address] Date: 07/07/2023 Prepared by: Helene Gasmen  Exercises - Supine Ankle Pumps  - 8 x daily - 7 x weekly - 2 sets - 10 reps - Supine Heel Slide with Strap  - 1 x daily - 7 x weekly - 3 sets - 10 reps - Seated Hamstring Stretch  - 2 x daily - 7 x weekly - 1 sets - 3 reps - 45 hold - Long Sitting 4 Way Patellar Glide  - 4 x daily - 7 x weekly - 3 sets - 10 reps - Supine Quad Set  - 5 x daily - 7 x weekly - 2 sets - 10 reps - 10 hold - Small Range Straight Leg Raise  - 1 x daily - 7 x weekly - 3 sets - 10 reps - Ice  - 5 x daily - 7 x weekly - 1 sets - 1 reps - 20 min hold  ASSESSMENT:  CLINICAL IMPRESSION: Pt continues to do well with flexion ROM.  She is continues to lack ~8 degrees of ext ROM.  Discussed the need to work on this frequently at home.  She does show improved quad activation today and reduced lag with SLR.   OBJECTIVE IMPAIRMENTS: Abnormal gait, decreased activity tolerance, decreased balance, decreased endurance, difficulty walking, decreased ROM, decreased strength, decreased safety awareness, and pain.   ACTIVITY LIMITATIONS: carrying, lifting, bending, standing, squatting, sleeping, stairs, transfers, bed  mobility, bathing, toileting, dressing, hygiene/grooming, and locomotion level  PARTICIPATION LIMITATIONS: meal prep, cleaning, laundry, interpersonal relationship, shopping, and community activity  PERSONAL FACTORS: Behavior pattern, Past/current experiences, and 3+ comorbidities: Relevant PMHx includes anxiety, arthritis, COPD, depression, dyspnea on exertion, hypertension, hyperparathyroidism, stage 3b CKD, history of tobacco and alcohol use  are also affecting patient's functional outcome.   REHAB POTENTIAL: Fair    CLINICAL DECISION MAKING: Evolving/moderate complexity  EVALUATION COMPLEXITY: Moderate   GOALS: Goals reviewed with patient? Yes  SHORT TERM GOALS: Target date: 07/27/2023   Patient will be independent with initial home program for knee ROM and strengthening as appropriate to post op status .  Baseline: reviewed at eval  Goal status: INITIAL  2.  Patient will demonstrate improved standing tolerance using LRAD and upright posture.  Baseline: unable to stand d/t pain Goal status: INITIAL   3.  Patient will demonstrate at least Mod I with stand-pivot transfers and safe mechanics.  Baseline: requiring min-mod A with pivot transfer from w/c to treatament table Goal status: INITIAL   LONG TERM GOALS: Target date: 08/23/2022  Patient will report improved overall functional ability with FOTO score of 50 or greater  Baseline: 23 Goal status: INITIAL  2.  Patient will demonstrate at least 120 deg knee flexion AROM and 0 degrees of knee extension AROM.  Baseline: see objective measures  Goal status: INITIAL  3.  Patient will demonstrate ability to safely ambulate at least 150 ft during 2 minute walk test and use  of LRAD.  Baseline: unable to tolerate ambulation  Goal status: INITIAL  4.  Patient will demonstrate at least 4/5 MMT with left LE resisted testing Baseline: see objective measures  Goal status: INITIAL  5.  Patient will report ability to perform all  ADLs/IADLs with <4/10 pain severity, using activity modifications and rest breaks as appropriate.  Baseline: limited by pain  Goal status: INITIAL    PLAN:  PT FREQUENCY: 2x/week  PT DURATION: 8 weeks  PLANNED INTERVENTIONS: 97164- PT Re-evaluation, 97110-Therapeutic exercises, 97530- Therapeutic activity, 97112- Neuromuscular re-education, 97535- Self Care, [id number]- Manual therapy, [phone]- Gait training, [phone]- Aquatic Therapy, [phone]- Vasopneumatic device, Patient/Family education, Balance training, Stair training, Taping, Cryotherapy, and Moist heat  PLAN FOR NEXT SESSION: Left knee ROM, lower extremity strengthening as appropriate for postop status, aerobic activity for AAROM and pain management, transfer training including bed mobility, assess 30sec sit to stand, 2-minute walk test    Helene FORBES Gasmen PT 07/21/2023, 2:46 PM    Date of referral: 06/03/2023 Referring provider: Edna Toribio LABOR, MD Referring diagnosis? Left Total Knee Arthroplasty  Treatment diagnosis? (if different than referring diagnosis) Chronic pain of left knee Difficulty in walking, not elsewhere classified Localized edema  What was this (referring dx) caused by? Surgery (Type: total knee arthroplasty)  Lysle of Condition: Initial Onset (within last 3 months)   Laterality: Lt  Current Functional Measure Score: FOTO 23% functional  Objective measurements identify impairments when they are compared to normal values, the uninvolved extremity, and prior level of function.  [x]  Yes  []  No  Objective assessment of functional ability: Severe functional limitations   Briefly describe symptoms: left knee pain, decreased mobility, decreased strength, localized edema, left leg pain   How did symptoms start: surgery   Average pain intensity:  Last 24 hours: 8/10  Past week: 10/10  How often does the pt experience symptoms? Constantly  How much have the symptoms interfered with usual daily  activities? Extremely  How has condition changed since care began at this facility? NA - initial visit  In general, how is the patients overall health? Fair   BACK PAIN (STarT Back Screening Tool) No

## 2023-07-23 ENCOUNTER — Ambulatory Visit: Payer: 59

## 2023-07-23 DIAGNOSIS — R600 Localized edema: Secondary | ICD-10-CM

## 2023-07-23 DIAGNOSIS — M25562 Pain in left knee: Secondary | ICD-10-CM | POA: Diagnosis not present

## 2023-07-23 DIAGNOSIS — G8929 Other chronic pain: Secondary | ICD-10-CM | POA: Diagnosis not present

## 2023-07-23 DIAGNOSIS — R262 Difficulty in walking, not elsewhere classified: Secondary | ICD-10-CM | POA: Diagnosis not present

## 2023-07-23 NOTE — Therapy (Signed)
Daily Note   Patient Name: Anna Mcguire MRN: [id number] DOB:04/20/1956, 68 y.o., female Today's Date: 07/23/2023  END OF SESSION:  PT End of Session - 07/23/23 1320     Visit Number 5    Number of Visits 17    Date for PT Re-Evaluation 08/24/23    Authorization Type UHC MCR/MCD secondary    PT Start Time 1330    PT Stop Time 1410    PT Time Calculation (min) 40 min    Activity Tolerance Patient tolerated treatment well;Patient limited by pain    Behavior During Therapy [facility] for tasks assessed/performed              Past Medical History:  Diagnosis Date   Acid reflux    Anxiety    Arthritis    COPD (chronic obstructive pulmonary disease) (HCC)    Depression    Dyspnea    Hepatitis    Hypertension    Past Surgical History:  Procedure Laterality Date   COLONOSCOPY WITH PROPOFOL N/A 06/07/2014   Procedure: COLONOSCOPY WITH PROPOFOL;  Surgeon: Willis Modena, MD;  Location: [facility];  Service: Endoscopy;  Laterality: N/A;   ESOPHAGOGASTRODUODENOSCOPY (EGD) WITH PROPOFOL N/A 06/07/2014   Procedure: ESOPHAGOGASTRODUODENOSCOPY (EGD) WITH PROPOFOL;  Surgeon: Willis Modena, MD;  Location: [facility];  Service: Endoscopy;  Laterality: N/A;   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   Procedure: TOTAL KNEE ARTHROPLASTY;  Surgeon: Joen Laura, MD;  Location: [facility];  Service: Orthopedics;  Laterality: Left;   Patient Active Problem List   Diagnosis Date Noted   Primary osteoarthritis of left knee 06/24/2023   Normocytic anemia 04/08/2023   Loss of weight 04/08/2023   Vomiting without nausea 04/08/2023   Altered bowel habits 04/08/2023   Primary hyperparathyroidism (HCC) 03/26/2023   Arthritis of carpometacarpal (CMC) joint of left thumb 04/23/2022   Pain in left shoulder 04/23/2022   Hypercalcemia 12/31/2021   Stage 3b chronic kidney disease (HCC) 12/31/2021   Allergic rhinitis 10/24/2020   Rotator cuff arthropathy of left shoulder 08/07/2020    Cervicalgia 08/07/2020   Chronic cough 03/21/2020   Alcohol use disorder, moderate, dependence (HCC) 01/30/2020   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   Tobacco dependence 01/30/2020   Diarrhea of infectious origin 01/30/2020   Hyponatremia 01/13/2020   Alcohol abuse 01/13/2020   Tobacco abuse 01/13/2020   Chronic diarrhea 01/13/2020   Hypophosphatemia 01/13/2020   Hypertension    Chronic obstructive pulmonary disease (HCC)    Alcohol use    Hypokalemia 01/12/2020   Tobacco use 10/06/2019   Unilateral primary osteoarthritis, left knee 09/08/2019   Pain in right shoulder 08/11/2019    PCP: Marcine Matar, MD  REFERRING PROVIDER: Joen Laura, MD  REFERRING DIAG: L TKA 06/24/23  THERAPY DIAG:  Chronic pain of left knee  Localized edema  Difficulty in walking, not elsewhere classified  Rationale for Evaluation and Treatment: Rehabilitation  ONSET DATE: 06/24/23 DOS    SUBJECTIVE:   SUBJECTIVE STATEMENT: Patient reports that her knee is still sore, states she has been doing a lot around the house.    PERTINENT HISTORY: Relevant PMHx includes anxiety, arthritis, COPD, depression, dyspnea on exertion, hypertension, hyperparathyroidism, stage 3b CKD, history of tobacco and alcohol use PAIN:  Are you having pain?  Yes: NPRS scale: 8/10 current & at worst Pain location: L knee  Pain description: aching  Aggravating factors: movement  Relieving factors: pain medicine   PRECAUTIONS: Knee and Fall, ACTIVE BED  BUGS  RED FLAGS: None   WEIGHT BEARING RESTRICTIONS: No  FALLS:  Has patient fallen in last 6 months? Yes. Number of falls 2 since date of surgery, reports several prior to surgery  LIVING ENVIRONMENT: Lives with: lives with their family and lives alone Stairs: No Has following equipment at home: Dan Humphreys - 2 wheeled and Environmental consultant - 4 wheeled  OCCUPATION: N/A  PLOF: Independent  PATIENT GOALS: To decrease pain,  improve independence  NEXT MD VISIT: 07/28/2023  OBJECTIVE:  Note: Objective measures were completed at Evaluation unless otherwise noted.  DIAGNOSTIC FINDINGS:   06/24/23  FINDINGS: Interval total left knee arthroplasty. No perihardware lucency is seen to indicate hardware failure or loosening. Postoperative changes of intra-articular air and subcutaneous air. Tiny joint effusion. No acute fracture or dislocation.   IMPRESSION: Interval total left knee arthroplasty without evidence of hardware failure.  PATIENT SURVEYS:  FOTO 23 current, 50 predicted  COGNITION: Overall cognitive status: Within functional limits for tasks assessed     SENSATION: Not tested  POSTURE: flexed trunk  and weight shift right  PALPATION: Min-to-moderate TTP surrounding surgical site; moderate TTP around L hip   LOWER EXTREMITY ROM:  Active ROM Right eval Left eval 12/31 1/8 1/14  Hip flexion       Hip extension       Hip abduction       Hip adduction       Hip internal rotation       Hip external rotation       Knee flexion  90 95 112 degrees    Knee extension  -8 Lacking 6 PROM  Lacking 8 PROM  Ankle dorsiflexion       Ankle plantarflexion       Ankle inversion       Ankle eversion        (Blank rows = not tested)  LOWER EXTREMITY MMT:  MMT Right eval Left eval  Hip flexion    Hip extension    Hip abduction    Hip adduction    Hip internal rotation    Hip external rotation    Knee flexion  2-  Knee extension  2-  Ankle dorsiflexion    Ankle plantarflexion    Ankle inversion    Ankle eversion     (Blank rows = not tested)   FUNCTIONAL TESTS:  30 seconds chair stand test: to be assessed  2 minute walk test: To be assessed  GAIT: Distance walked: 0 ft Assistive device utilized: Wheelchair (manual) Level of assistance: Mod A for transfers  Comments: unable to transfer with Mod I, limited ambulation tolerance with AD d/t L knee and hip pain                                                                                                                                  TREATMENT DATE:  [facility] Adult PT Treatment:  DATE: 07/23/23 Therapeutic Exercise: nu-step L3 81m at end of session for flexion ROM and analgesic effects LLLD stretch in supine 1/2 foam roll x2' Quad set for ext with PT OP 5" hold x10 SAQ - 3x10 5'' hold - cuing for form SLR - 5x5 LAQ - 3x10 TKE - YTB - 20x Therapeutic Activity:  207 ft   [facility] Adult PT Treatment  07/21/23 :  Therapeutic Exercise:  nu-step L3 53m while taking subjective and planning session with patient Quad set for ext with PT OP SAQ - 3x10 5'' hold - cuing for form SLR - 5x5 LAQ - 3x10 TKE - YTB - 20x  Manual Therapy  Knee ext mob in supine   PATIENT EDUCATION:  Education details: reviewed initial home exercise program; discussion of POC, prognosis and goals for skilled PT   Person educated: Patient and Sister Education method: Explanation, Demonstration, and Handouts Education comprehension: verbalized understanding, returned demonstration, and needs further education  HOME EXERCISE PROGRAM: Access Code: [email or web address] URL: [email or web address] Date: 07/07/2023 Prepared by: Alphonzo Severance  Exercises - Supine Ankle Pumps  - 8 x daily - 7 x weekly - 2 sets - 10 reps - Supine Heel Slide with Strap  - 1 x daily - 7 x weekly - 3 sets - 10 reps - Seated Hamstring Stretch  - 2 x daily - 7 x weekly - 1 sets - 3 reps - 45 hold - Long Sitting 4 Way Patellar Glide  - 4 x daily - 7 x weekly - 3 sets - 10 reps - Supine Quad Set  - 5 x daily - 7 x weekly - 2 sets - 10 reps - 10 hold - Small Range Straight Leg Raise  - 1 x daily - 7 x weekly - 3 sets - 10 reps - Ice  - 5 x daily - 7 x weekly - 1 sets - 1 reps - 20 min hold  ASSESSMENT:  CLINICAL IMPRESSION: Patient presents to PT reporting continued knee soreness and that she has been active with  her grandkids this week. performed this session with patient achieving 240ft. Session today continued to focus on knee ROM and quadriceps strengthening. Patient was able to tolerate all prescribed exercises with no adverse effects. Patient continues to benefit from skilled PT services and should be progressed as able to improve functional independence.    OBJECTIVE IMPAIRMENTS: Abnormal gait, decreased activity tolerance, decreased balance, decreased endurance, difficulty walking, decreased ROM, decreased strength, decreased safety awareness, and pain.   ACTIVITY LIMITATIONS: carrying, lifting, bending, standing, squatting, sleeping, stairs, transfers, bed mobility, bathing, toileting, dressing, hygiene/grooming, and locomotion level  PARTICIPATION LIMITATIONS: meal prep, cleaning, laundry, interpersonal relationship, shopping, and community activity  PERSONAL FACTORS: Behavior pattern, Past/current experiences, and 3+ comorbidities: Relevant PMHx includes anxiety, arthritis, COPD, depression, dyspnea on exertion, hypertension, hyperparathyroidism, stage 3b CKD, history of tobacco and alcohol use  are also affecting patient's functional outcome.   REHAB POTENTIAL: Fair    CLINICAL DECISION MAKING: Evolving/moderate complexity  EVALUATION COMPLEXITY: Moderate   GOALS: Goals reviewed with patient? Yes  SHORT TERM GOALS: Target date: 07/27/2023   Patient will be independent with initial home program for knee ROM and strengthening as appropriate to post op status .  Baseline: reviewed at eval  Goal status: INITIAL  2.  Patient will demonstrate improved standing tolerance using LRAD and upright posture.  Baseline: unable to stand d/t pain Goal status: INITIAL   3.  Patient will demonstrate at least Mod I with  stand-pivot transfers and safe mechanics.  Baseline: requiring min-mod A with pivot transfer from w/c to treatament table Goal status: INITIAL   LONG TERM GOALS: Target date:  08/23/2022  Patient will report improved overall functional ability with FOTO score of 50 or greater  Baseline: 23 Goal status: INITIAL  2.  Patient will demonstrate at least 120 deg knee flexion AROM and 0 degrees of knee extension AROM.  Baseline: see objective measures  Goal status: INITIAL  3.  Patient will demonstrate ability to safely ambulate at least 150 ft during 2 minute walk test and use of LRAD.  Baseline: unable to tolerate ambulation  Goal status: INITIAL  4.  Patient will demonstrate at least 4/5 MMT with left LE resisted testing Baseline: see objective measures  Goal status: INITIAL  5.  Patient will report ability to perform all ADLs/IADLs with <4/10 pain severity, using activity modifications and rest breaks as appropriate.  Baseline: limited by pain  Goal status: INITIAL    PLAN:  PT FREQUENCY: 2x/week  PT DURATION: 8 weeks  PLANNED INTERVENTIONS: 97164- PT Re-evaluation, 97110-Therapeutic exercises, 97530- Therapeutic activity, 97112- Neuromuscular re-education, 97535- Self Care, 63016- Manual therapy, [phone]- Gait training, [phone]- Aquatic Therapy, [phone]- Vasopneumatic device, Patient/Family education, Balance training, Stair training, Taping, Cryotherapy, and Moist heat  PLAN FOR NEXT SESSION: Left knee ROM, lower extremity strengthening as appropriate for postop status, aerobic activity for AAROM and pain management, transfer training including bed mobility, assess 30sec sit to stand, 2-minute walk test    Berta Minor PTA 07/23/2023, 2:17 PM    Date of referral: 06/03/2023 Referring provider: Joen Laura, MD Referring diagnosis? Left Total Knee Arthroplasty  Treatment diagnosis? (if different than referring diagnosis) Chronic pain of left knee Difficulty in walking, not elsewhere classified Localized edema  What was this (referring dx) caused by? Surgery (Type: total knee arthroplasty)  Ashby Dawes of Condition: Initial Onset (within last  3 months)   Laterality: Lt  Current Functional Measure Score: FOTO 23% functional  Objective measurements identify impairments when they are compared to normal values, the uninvolved extremity, and prior level of function.  [x]  Yes  []  No  Objective assessment of functional ability: Severe functional limitations   Briefly describe symptoms: left knee pain, decreased mobility, decreased strength, localized edema, left leg pain   How did symptoms start: surgery   Average pain intensity:  Last 24 hours: 8/10  Past week: 10/10  How often does the pt experience symptoms? Constantly  How much have the symptoms interfered with usual daily activities? Extremely  How has condition changed since care began at this facility? NA - initial visit  In general, how is the patients overall health? Fair   BACK PAIN (STarT Back Screening Tool) No

## 2023-07-28 ENCOUNTER — Ambulatory Visit: Payer: 59 | Admitting: Physical Therapy

## 2023-07-28 ENCOUNTER — Ambulatory Visit: Payer: 59 | Admitting: Internal Medicine

## 2023-07-28 ENCOUNTER — Encounter: Payer: Self-pay | Admitting: Physical Therapy

## 2023-07-28 DIAGNOSIS — G8929 Other chronic pain: Secondary | ICD-10-CM

## 2023-07-28 DIAGNOSIS — R262 Difficulty in walking, not elsewhere classified: Secondary | ICD-10-CM

## 2023-07-28 DIAGNOSIS — R600 Localized edema: Secondary | ICD-10-CM

## 2023-07-28 DIAGNOSIS — M25562 Pain in left knee: Secondary | ICD-10-CM | POA: Diagnosis not present

## 2023-07-28 NOTE — Therapy (Signed)
Daily Note   Patient Name: Anna Mcguire MRN: [id number] DOB:1956-04-17, 68 y.o., female Today's Date: 07/28/2023  END OF SESSION:  PT End of Session - 07/28/23 1303     Visit Number 6    Number of Visits 17    Date for PT Re-Evaluation 08/24/23    Authorization Type [employer] MCR/MCD secondary    PT Start Time 1302    PT Stop Time 1343    PT Time Calculation (min) 41 min    Activity Tolerance Patient tolerated treatment well;Patient limited by pain    Behavior During Therapy [facility] for tasks assessed/performed              Past Medical History:  Diagnosis Date   Acid reflux    Anxiety    Arthritis    COPD (chronic obstructive pulmonary disease) (HCC)    Depression    Dyspnea    Hepatitis    Hypertension    Past Surgical History:  Procedure Laterality Date   COLONOSCOPY WITH PROPOFOL N/A 06/07/2014   Procedure: COLONOSCOPY WITH PROPOFOL;  Surgeon: Willis Modena, MD;  Location: [facility];  Service: Endoscopy;  Laterality: N/A;   ESOPHAGOGASTRODUODENOSCOPY (EGD) WITH PROPOFOL N/A 06/07/2014   Procedure: ESOPHAGOGASTRODUODENOSCOPY (EGD) WITH PROPOFOL;  Surgeon: Willis Modena, MD;  Location: [facility];  Service: Endoscopy;  Laterality: N/A;   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   Procedure: TOTAL KNEE ARTHROPLASTY;  Surgeon: Joen Laura, MD;  Location: [facility];  Service: Orthopedics;  Laterality: Left;   Patient Active Problem List   Diagnosis Date Noted   Primary osteoarthritis of left knee 06/24/2023   Normocytic anemia 04/08/2023   Loss of weight 04/08/2023   Vomiting without nausea 04/08/2023   Altered bowel habits 04/08/2023   Primary hyperparathyroidism (HCC) 03/26/2023   Arthritis of carpometacarpal (CMC) joint of left thumb 04/23/2022   Pain in left shoulder 04/23/2022   Hypercalcemia 12/31/2021   Stage 3b chronic kidney disease (HCC) 12/31/2021   Allergic rhinitis 10/24/2020   Rotator cuff arthropathy of left shoulder 08/07/2020    Cervicalgia 08/07/2020   Chronic cough 03/21/2020   Alcohol use disorder, moderate, dependence (HCC) 01/30/2020   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   Tobacco dependence 01/30/2020   Diarrhea of infectious origin 01/30/2020   Hyponatremia 01/13/2020   Alcohol abuse 01/13/2020   Tobacco abuse 01/13/2020   Chronic diarrhea 01/13/2020   Hypophosphatemia 01/13/2020   Hypertension    Chronic obstructive pulmonary disease (HCC)    Alcohol use    Hypokalemia 01/12/2020   Tobacco use 10/06/2019   Unilateral primary osteoarthritis, left knee 09/08/2019   Pain in right shoulder 08/11/2019    PCP: Marcine Matar, MD  REFERRING PROVIDER: Joen Laura, MD  REFERRING DIAG: L TKA 06/24/23  THERAPY DIAG:  Chronic pain of left knee  Localized edema  Difficulty in walking, not elsewhere classified  Rationale for Evaluation and Treatment: Rehabilitation  ONSET DATE: 06/24/23 DOS    SUBJECTIVE:   SUBJECTIVE STATEMENT: Pt states her knee is still sore and stiff, but improving.   PERTINENT HISTORY: Relevant PMHx includes anxiety, arthritis, COPD, depression, dyspnea on exertion, hypertension, hyperparathyroidism, stage 3b CKD, history of tobacco and alcohol use PAIN:  Are you having pain?  Yes: NPRS scale: 8/10 current & at worst Pain location: L knee  Pain description: aching  Aggravating factors: movement  Relieving factors: pain medicine   PRECAUTIONS: Knee and Fall, ACTIVE BED BUGS  RED FLAGS: None   WEIGHT  BEARING RESTRICTIONS: No  FALLS:  Has patient fallen in last 6 months? Yes. Number of falls 2 since date of surgery, reports several prior to surgery  LIVING ENVIRONMENT: Lives with: lives with their family and lives alone Stairs: No Has following equipment at home: Dan Humphreys - 2 wheeled and Environmental consultant - 4 wheeled  OCCUPATION: N/A  PLOF: Independent  PATIENT GOALS: To decrease pain, improve independence  NEXT MD VISIT:  07/28/2023  OBJECTIVE:  Note: Objective measures were completed at Evaluation unless otherwise noted.  DIAGNOSTIC FINDINGS:   06/24/23  FINDINGS: Interval total left knee arthroplasty. No perihardware lucency is seen to indicate hardware failure or loosening. Postoperative changes of intra-articular air and subcutaneous air. Tiny joint effusion. No acute fracture or dislocation.   IMPRESSION: Interval total left knee arthroplasty without evidence of hardware failure.  PATIENT SURVEYS:  FOTO 23 current, 50 predicted  COGNITION: Overall cognitive status: Within functional limits for tasks assessed     SENSATION: Not tested  POSTURE: flexed trunk  and weight shift right  PALPATION: Min-to-moderate TTP surrounding surgical site; moderate TTP around L hip   LOWER EXTREMITY ROM:  Active ROM Right eval Left eval 12/31 1/8 1/14 1/21  Hip flexion        Hip extension        Hip abduction        Hip adduction        Hip internal rotation        Hip external rotation        Knee flexion  90 95 112 degrees     Knee extension  -8 Lacking 6 PROM  Lacking 8 PROM   Ankle dorsiflexion        Ankle plantarflexion        Ankle inversion        Ankle eversion         (Blank rows = not tested)  LOWER EXTREMITY MMT:  MMT Right eval Left eval  Hip flexion    Hip extension    Hip abduction    Hip adduction    Hip internal rotation    Hip external rotation    Knee flexion  2-  Knee extension  2-  Ankle dorsiflexion    Ankle plantarflexion    Ankle inversion    Ankle eversion     (Blank rows = not tested)   FUNCTIONAL TESTS:  30 seconds chair stand test: to be assessed  2 minute walk test: To be assessed  GAIT: Distance walked: 0 ft Assistive device utilized: Wheelchair (manual) Level of assistance: Mod A for transfers  Comments: unable to transfer with Mod I, limited ambulation tolerance with AD d/t L knee and hip pain                                                                                                                                  TREATMENT DATE:  [facility]  Treatment:                                                DATE: 07/28/23 Therapeutic Exercise: Bike for ROM LLLD stretch in supine 1/2 foam roll x3' - 5# Quad set for ext with PT OP 5" hold x10 SAQ - 3x10 5'' hold - cuing for form - 5# SLR - 3x10 Bridge - 2x10 LAQ - 3x10 - 5# TKE - YTB - 20x  Consider clam   PATIENT EDUCATION:  Education details: reviewed initial home exercise program; discussion of POC, prognosis and goals for skilled PT   Person educated: Patient and Sister Education method: Explanation, Demonstration, and Handouts Education comprehension: verbalized understanding, returned demonstration, and needs further education  HOME EXERCISE PROGRAM: Access Code: [email or web address] URL: [email or web address] Date: 07/28/2023 Prepared by: Alphonzo Severance  Exercises - Seated Hamstring Stretch  - 2 x daily - 7 x weekly - 1 sets - 3 reps - 45 hold - Long Sitting 4 Way Patellar Glide  - 4 x daily - 7 x weekly - 3 sets - 10 reps - Supine Quad Set  - 5 x daily - 7 x weekly - 2 sets - 10 reps - 10 hold - Small Range Straight Leg Raise  - 1 x daily - 7 x weekly - 3 sets - 10 reps - Supine Bridge  - 1 x daily - 7 x weekly - 2 sets - 10 reps - 2-3 seconds hold - Seated Knee Extension Stretch with Chair  - 1 x daily - 7 x weekly - 2 reps - 20 min hold - Ice  - 5 x daily - 7 x weekly - 1 sets - 1 reps - 20 min hold  ASSESSMENT:  CLINICAL IMPRESSION: Sandara tolerated session well with no adverse reaction.  Pt with some improvement in knee ext ROM today, but still lacking ~6 degrees passively.  I encouraged her to concentrate on this at home and provided a printout of LLLD stretching for knee ext.  Pt requires max cuing in clinic for form, so it is likely she is having difficulty performing exercises as intended at home, though she does have family members that  help her at times.   OBJECTIVE IMPAIRMENTS: Abnormal gait, decreased activity tolerance, decreased balance, decreased endurance, difficulty walking, decreased ROM, decreased strength, decreased safety awareness, and pain.   ACTIVITY LIMITATIONS: carrying, lifting, bending, standing, squatting, sleeping, stairs, transfers, bed mobility, bathing, toileting, dressing, hygiene/grooming, and locomotion level  PARTICIPATION LIMITATIONS: meal prep, cleaning, laundry, interpersonal relationship, shopping, and community activity  PERSONAL FACTORS: Behavior pattern, Past/current experiences, and 3+ comorbidities: Relevant PMHx includes anxiety, arthritis, COPD, depression, dyspnea on exertion, hypertension, hyperparathyroidism, stage 3b CKD, history of tobacco and alcohol use  are also affecting patient's functional outcome.   REHAB POTENTIAL: Fair    CLINICAL DECISION MAKING: Evolving/moderate complexity  EVALUATION COMPLEXITY: Moderate   GOALS: Goals reviewed with patient? Yes  SHORT TERM GOALS: Target date: 07/27/2023   Patient will be independent with initial home program for knee ROM and strengthening as appropriate to post op status .  Baseline: reviewed at eval  Goal status: MET  2.  Patient will demonstrate improved standing tolerance using LRAD and upright posture.  Baseline: unable to stand d/t pain Goal status: Ongoing   3.  Patient will demonstrate at least Mod I with stand-pivot transfers  and safe mechanics.  Baseline: requiring min-mod A with pivot transfer from w/c to treatament table Goal status: MET   LONG TERM GOALS: Target date: 08/23/2022  Patient will report improved overall functional ability with FOTO score of 50 or greater  Baseline: 23 Goal status: INITIAL  2.  Patient will demonstrate at least 120 deg knee flexion AROM and 0 degrees of knee extension AROM.  Baseline: see objective measures  Goal status: INITIAL  3.  Patient will demonstrate ability to  safely ambulate at least 150 ft during 2 minute walk test and use of LRAD.  Baseline: unable to tolerate ambulation  Goal status: INITIAL  4.  Patient will demonstrate at least 4/5 MMT with left LE resisted testing Baseline: see objective measures  Goal status: INITIAL  5.  Patient will report ability to perform all ADLs/IADLs with <4/10 pain severity, using activity modifications and rest breaks as appropriate.  Baseline: limited by pain  Goal status: INITIAL    PLAN:  PT FREQUENCY: 2x/week  PT DURATION: 8 weeks  PLANNED INTERVENTIONS: 97164- PT Re-evaluation, 97110-Therapeutic exercises, 97530- Therapeutic activity, 97112- Neuromuscular re-education, 97535- Self Care, 81191- Manual therapy, [phone]- Gait training, [phone]- Aquatic Therapy, [phone]- Vasopneumatic device, Patient/Family education, Balance training, Stair training, Taping, Cryotherapy, and Moist heat  PLAN FOR NEXT SESSION: Left knee ROM, lower extremity strengthening as appropriate for postop status, aerobic activity for AAROM and pain management, transfer training including bed mobility, assess 30sec sit to stand, 2-minute walk test    Fredderick Phenix PT 07/28/2023, 1:51 PM    Date of referral: 06/03/2023 Referring provider: Joen Laura, MD Referring diagnosis? Left Total Knee Arthroplasty  Treatment diagnosis? (if different than referring diagnosis) Chronic pain of left knee Difficulty in walking, not elsewhere classified Localized edema  What was this (referring dx) caused by? Surgery (Type: total knee arthroplasty)  Ashby Dawes of Condition: Initial Onset (within last 3 months)   Laterality: Lt  Current Functional Measure Score: FOTO 23% functional  Objective measurements identify impairments when they are compared to normal values, the uninvolved extremity, and prior level of function.  [x]  Yes  []  No  Objective assessment of functional ability: Severe functional limitations   Briefly describe  symptoms: left knee pain, decreased mobility, decreased strength, localized edema, left leg pain   How did symptoms start: surgery   Average pain intensity:  Last 24 hours: 8/10  Past week: 10/10  How often does the pt experience symptoms? Constantly  How much have the symptoms interfered with usual daily activities? Extremely  How has condition changed since care began at this facility? NA - initial visit  In general, how is the patients overall health? Fair   BACK PAIN (STarT Back Screening Tool) No

## 2023-07-29 ENCOUNTER — Ambulatory Visit: Payer: 59 | Admitting: Physical Therapy

## 2023-07-31 ENCOUNTER — Ambulatory Visit: Payer: 59

## 2023-07-31 DIAGNOSIS — R262 Difficulty in walking, not elsewhere classified: Secondary | ICD-10-CM | POA: Diagnosis not present

## 2023-07-31 DIAGNOSIS — R600 Localized edema: Secondary | ICD-10-CM | POA: Diagnosis not present

## 2023-07-31 DIAGNOSIS — G8929 Other chronic pain: Secondary | ICD-10-CM

## 2023-07-31 DIAGNOSIS — M25562 Pain in left knee: Secondary | ICD-10-CM | POA: Diagnosis not present

## 2023-07-31 NOTE — Telephone Encounter (Signed)
Copied from CRM [id number]. Topic: Clinical - Medication Question >> Jul 31, 2023  1:33 PM Phill Myron wrote: [facility]  Is asking if pt Neiss is still on the rx : amlodipine 10mg  once daily  Please advise.

## 2023-07-31 NOTE — Therapy (Signed)
PHYSICAL THERAPY DISCHARGE NOTE   Patient Name: Anna Mcguire MRN: [id number] DOB:[birth year], 68 y.o., female Today's Date: 07/31/2023   PHYSICAL THERAPY DISCHARGE SUMMARY  Visits from Start of Care: 7   Current functional level related to goals / functional outcomes: See objective findings/assessment   Remaining deficits: See objective findings/assessment    Education / Equipment: See today's treatment/assessment      Patient agrees to discharge. Patient goals were partially met. Patient is being discharged due to being pleased with the current functional level.     END OF SESSION:  PT End of Session - 07/31/23 1352     Visit Number 7    Number of Visits 17    Date for PT Re-Evaluation --    Authorization Type [employer] MCR/MCD secondary    PT Start Time 1347    PT Stop Time 1420    PT Time Calculation (min) 33 min    Activity Tolerance Patient tolerated treatment well    Behavior During Therapy WFL for tasks assessed/performed               Past Medical History:  Diagnosis Date   Acid reflux    Anxiety    Arthritis    COPD (chronic obstructive pulmonary disease) (HCC)    Depression    Dyspnea    Hepatitis    Hypertension    Past Surgical History:  Procedure Laterality Date   COLONOSCOPY WITH PROPOFOL N/A 06/07/2014   Procedure: COLONOSCOPY WITH PROPOFOL;  Surgeon: Willis Modena, MD;  Location: [facility];  Service: Endoscopy;  Laterality: N/A;   ESOPHAGOGASTRODUODENOSCOPY (EGD) WITH PROPOFOL N/A 06/07/2014   Procedure: ESOPHAGOGASTRODUODENOSCOPY (EGD) WITH PROPOFOL;  Surgeon: Willis Modena, MD;  Location: [facility];  Service: Endoscopy;  Laterality: N/A;   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   Procedure: TOTAL KNEE ARTHROPLASTY;  Surgeon: Joen Laura, MD;  Location: [facility];  Service: Orthopedics;  Laterality: Left;   Patient Active Problem List   Diagnosis Date Noted   Primary osteoarthritis of left knee 06/24/2023    Normocytic anemia 04/08/2023   Loss of weight 04/08/2023   Vomiting without nausea 04/08/2023   Altered bowel habits 04/08/2023   Primary hyperparathyroidism (HCC) 03/26/2023   Arthritis of carpometacarpal (CMC) joint of left thumb 04/23/2022   Pain in left shoulder 04/23/2022   Hypercalcemia 12/31/2021   Stage 3b chronic kidney disease (HCC) 12/31/2021   Allergic rhinitis 10/24/2020   Rotator cuff arthropathy of left shoulder 08/07/2020   Cervicalgia 08/07/2020   Chronic cough 03/21/2020   Alcohol use disorder, moderate, dependence (HCC) 01/30/2020   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   Tobacco dependence 01/30/2020   Diarrhea of infectious origin 01/30/2020   Hyponatremia 01/13/2020   Alcohol abuse 01/13/2020   Tobacco abuse 01/13/2020   Chronic diarrhea 01/13/2020   Hypophosphatemia 01/13/2020   Hypertension    Chronic obstructive pulmonary disease (HCC)    Alcohol use    Hypokalemia 01/12/2020   Tobacco use 10/06/2019   Unilateral primary osteoarthritis, left knee 09/08/2019   Pain in right shoulder 08/11/2019    PCP: Marcine Matar, MD  REFERRING PROVIDER: Joen Laura, MD  REFERRING DIAG: L TKA 06/24/23  THERAPY DIAG:  Chronic pain of left knee  Difficulty in walking, not elsewhere classified  Rationale for Evaluation and Treatment: Rehabilitation  ONSET DATE: 06/24/23 DOS    SUBJECTIVE:   SUBJECTIVE STATEMENT: Patient reports that she feels much better than initial evaluation, with less pain and  improved ability to perform daily activities. She states that she is ready to be discharged from PT.    PERTINENT HISTORY: Relevant PMHx includes anxiety, arthritis, COPD, depression, dyspnea on exertion, hypertension, hyperparathyroidism, stage 3b CKD, history of tobacco and alcohol use PAIN:  Are you having pain?  Yes: NPRS scale: 4/10  at worst Pain location: L knee  Pain description: aching  Aggravating factors:  movement  Relieving factors: pain medicine   PRECAUTIONS: Knee and Fall, ACTIVE BED BUGS  RED FLAGS: None   WEIGHT BEARING RESTRICTIONS: No  FALLS:  Has patient fallen in last 6 months? Yes. Number of falls 2 since date of surgery, reports several prior to surgery  LIVING ENVIRONMENT: Lives with: lives with their family and lives alone Stairs: No Has following equipment at home: Dan Humphreys - 2 wheeled and Environmental consultant - 4 wheeled  OCCUPATION: N/A  PLOF: Independent  PATIENT GOALS: To decrease pain, improve independence  NEXT MD VISIT: 07/28/2023  OBJECTIVE:  Note: Objective measures were completed at Evaluation unless otherwise noted.  DIAGNOSTIC FINDINGS:   06/24/23  FINDINGS: Interval total left knee arthroplasty. No perihardware lucency is seen to indicate hardware failure or loosening. Postoperative changes of intra-articular air and subcutaneous air. Tiny joint effusion. No acute fracture or dislocation.   IMPRESSION: Interval total left knee arthroplasty without evidence of hardware failure.  PATIENT SURVEYS:  FOTO 23 current, 50 predicted  COGNITION: Overall cognitive status: Within functional limits for tasks assessed     SENSATION: Not tested  POSTURE: flexed trunk  and weight shift right  PALPATION: Min-to-moderate TTP surrounding surgical site; moderate TTP around L hip   LOWER EXTREMITY ROM:  Active ROM Right eval Left eval 12/31 1/8 1/14 1/24  Hip flexion        Hip extension        Hip abduction        Hip adduction        Hip internal rotation        Hip external rotation        Knee flexion  90 95 112 degrees   112 deg  Knee extension  -8 Lacking 6 PROM  Lacking 8 PROM Lacking 5 AROM (in seated)   Ankle dorsiflexion        Ankle plantarflexion        Ankle inversion        Ankle eversion         (Blank rows = not tested)  LOWER EXTREMITY MMT:  MMT Right eval Left eval Left 07/31/23  Hip flexion     Hip extension     Hip abduction      Hip adduction     Hip internal rotation     Hip external rotation     Knee flexion  2- 4-  Knee extension  2- 4-  Ankle dorsiflexion     Ankle plantarflexion     Ankle inversion     Ankle eversion      (Blank rows = not tested)   FUNCTIONAL TESTS:  2 minute walk test: see below   GAIT: Distance walked: 0 ft Assistive device utilized: Wheelchair (manual) Level of assistance: Mod A for transfers  Comments: unable to transfer with Mod I, limited ambulation tolerance with AD d/t L knee and hip pain   As of 07/31/23: patient is able to ambulate Mod I with rolling walking. She does demonstrate excessive toe out BIL, knee valgus, and decreased DF during swing phase bilaterally. No LOB  noted during 2 MWT today.                                                                                                                                 TREATMENT DATE:   [facility] Adult PT Treatment:                                                DATE: 07/31/23 Therapeutic Exercise: Bike for ROM x 5 min   Therapeutic Activity:  Reassessment of objective measures and subjective assessment regarding progress towards established goals and plan for independence with prescribed home program following discharged from PT 2 MWT with RW  Patient Education regarding current progress towards goals/overall prognosis, importance of HEP, indications/considerations for use of thermal modalities, positioning for sleep     PATIENT EDUCATION:  Education details: reviewed initial home exercise program; discussion of POC, prognosis and goals for skilled PT   Person educated: Patient and Sister Education method: Explanation, Demonstration, and Handouts Education comprehension: verbalized understanding, returned demonstration, and needs further education  HOME EXERCISE PROGRAM: Access Code: [email or web address] URL: [email or web address] Date: 07/28/2023 Prepared by: Alphonzo Severance  Exercises - Seated Hamstring  Stretch  - 2 x daily - 7 x weekly - 1 sets - 3 reps - 45 hold - Long Sitting 4 Way Patellar Glide  - 4 x daily - 7 x weekly - 3 sets - 10 reps - Supine Quad Set  - 5 x daily - 7 x weekly - 2 sets - 10 reps - 10 hold - Small Range Straight Leg Raise  - 1 x daily - 7 x weekly - 3 sets - 10 reps - Supine Bridge  - 1 x daily - 7 x weekly - 2 sets - 10 reps - 2-3 seconds hold - Seated Knee Extension Stretch with Chair  - 1 x daily - 7 x weekly - 2 reps - 20 min hold - Ice  - 5 x daily - 7 x weekly - 1 sets - 1 reps - 20 min hold  ASSESSMENT:  CLINICAL IMPRESSION: Daneshia has 7 total PT sessions following Left TKA procedure. She has made good progress with her ROM, strength and tolerance of daily activities. Patient is interested in discharge from PT at this time, d/t exceeding PLOF. There is a possibility that she would benefit from additional skilled PT to further address her knee extension AROM, gait deviations, and weight bearing tolerance. However, she will be discharged from skilled PT today with updated HEP to continue making gradual progress. She understands possibility of returning to skilled PT shoulder her activity tolerance worsen. We also spent time today discussing positioning for sleeping, indication and considerations for use of thermal modalities, and importance of knee extension exercises.     OBJECTIVE IMPAIRMENTS: Abnormal gait, decreased activity tolerance,  decreased balance, decreased endurance, difficulty walking, decreased ROM, decreased strength, decreased safety awareness, and pain.   ACTIVITY LIMITATIONS: carrying, lifting, bending, standing, squatting, sleeping, stairs, transfers, bed mobility, bathing, toileting, dressing, hygiene/grooming, and locomotion level  PARTICIPATION LIMITATIONS: meal prep, cleaning, laundry, interpersonal relationship, shopping, and community activity  PERSONAL FACTORS: Behavior pattern, Past/current experiences, and 3+ comorbidities: Relevant  PMHx includes anxiety, arthritis, COPD, depression, dyspnea on exertion, hypertension, hyperparathyroidism, stage 3b CKD, history of tobacco and alcohol use  are also affecting patient's functional outcome.   REHAB POTENTIAL: Fair    CLINICAL DECISION MAKING: Evolving/moderate complexity  EVALUATION COMPLEXITY: Moderate   GOALS: Goals reviewed with patient? Yes  SHORT TERM GOALS: Target date: 07/27/2023   Patient will be independent with initial home program for knee ROM and strengthening as appropriate to post op status .  Baseline: reviewed at eval  Goal status: MET  2.  Patient will demonstrate improved standing tolerance using LRAD and upright posture.  Baseline: unable to stand d/t pain Goal status: MET; with mild forward flexed posture   3.  Patient will demonstrate at least Mod I with stand-pivot transfers and safe mechanics.  Baseline: requiring min-mod A with pivot transfer from w/c to treatament table Goal status: MET   LONG TERM GOALS: Target date: 08/23/2022  Patient will report improved overall functional ability with FOTO score of 50 or greater  Baseline: 23 07/31/23: 44 Goal status: NOT MET  2.  Patient will demonstrate at least 120 deg knee flexion AROM and 0 degrees of knee extension AROM.  Baseline: see objective measures  Goal status: NOT MET  3.  Patient will demonstrate ability to safely ambulate at least 150 ft during 2 minute walk test and use of LRAD.  Baseline: unable to tolerate ambulation  07/31/23: 230 ft Goal status: MET  4.  Patient will demonstrate at least 4/5 MMT with left LE resisted testing Baseline: see objective measures  Goal status: Partially MET  5.  Patient will report ability to perform all ADLs/IADLs with <4/10 pain severity, using activity modifications and rest breaks as appropriate.  Baseline: limited by pain  Goal status: MET    PLAN:  PT FREQUENCY: 2x/week  PT DURATION: 8 weeks  PLANNED INTERVENTIONS: 97164- PT  Re-evaluation, 97110-Therapeutic exercises, 97530- Therapeutic activity, [id number]- Neuromuscular re-education, 97535- Self Care, 16109- Manual therapy, [id number]- Gait training, [id number]- Aquatic Therapy, [phone]- Vasopneumatic device, Patient/Family education, Balance training, Stair training, Taping, Cryotherapy, and Moist heat  PLAN FOR NEXT SESSION: discharged with HEP     Mauri Reading, PT, DPT  07/31/2023 2:53 PM

## 2023-08-04 ENCOUNTER — Other Ambulatory Visit: Payer: Self-pay | Admitting: Internal Medicine

## 2023-08-04 ENCOUNTER — Ambulatory Visit: Payer: 59 | Admitting: Physical Therapy

## 2023-08-04 ENCOUNTER — Telehealth: Payer: Self-pay | Admitting: Internal Medicine

## 2023-08-04 DIAGNOSIS — I10 Essential (primary) hypertension: Secondary | ICD-10-CM

## 2023-08-04 NOTE — Telephone Encounter (Unsigned)
Copied from CRM [id number]. Topic: Clinical - Medication Refill >> Aug 04, 2023  1:28 PM Ivette P wrote: Most Recent Primary Care Visit:  Provider: Jonah Blue B  Department: [facility]  Visit Type: OFFICE VISIT  Date: 05/14/2023  Medication: amLODipine (NORVASC) 10 MG tablet  Has the patient contacted their pharmacy? Yes (Agent: If no, request that the patient contact the pharmacy for the refill. If patient does not wish to contact the pharmacy document the reason why and proceed with request.) (Agent: If yes, when and what did the pharmacy advise?)  Is this the correct pharmacy for this prescription? Yes If no, delete pharmacy and type the correct one.  This is the patient's preferred pharmacy:  [facility] - [place], [place] - [address] [place] [address] Phone: [phone] Fax: [phone]    Has the prescription been filled recently? No  Is the patient out of the medication? Yes  Has the patient been seen for an appointment in the last year OR does the patient have an upcoming appointment? Yes  Can we respond through MyChart? No  Agent: Please be advised that Rx refills may take up to 3 business days. We ask that you follow-up with your pharmacy.

## 2023-08-05 ENCOUNTER — Telehealth: Payer: Self-pay

## 2023-08-05 MED ORDER — AMLODIPINE BESYLATE 10 MG PO TABS: ORAL_TABLET | ORAL | 1 refills | Status: DC

## 2023-08-05 NOTE — Telephone Encounter (Signed)
Pt was called and informed of medication being refilled.

## 2023-08-05 NOTE — Telephone Encounter (Signed)
All patient current medications are currently at [facility].     Copied from CRM [id number]. Topic: Clinical - Prescription Issue >> Aug 04, 2023  1:29 PM Ivette P wrote: Reason for CRM: Adaly called in to state pt went in to get refills and someone changed the pharmacy for the pt, not pt's wishes. Pt would like all her refills to go to [facility]

## 2023-08-05 NOTE — Telephone Encounter (Signed)
RF sent on Amlodipine.

## 2023-08-06 ENCOUNTER — Ambulatory Visit: Payer: 59

## 2023-08-06 DIAGNOSIS — M1712 Unilateral primary osteoarthritis, left knee: Secondary | ICD-10-CM | POA: Diagnosis not present

## 2023-08-06 DIAGNOSIS — M25512 Pain in left shoulder: Secondary | ICD-10-CM | POA: Diagnosis not present

## 2023-08-06 NOTE — Telephone Encounter (Signed)
error

## 2023-08-11 ENCOUNTER — Ambulatory Visit: Payer: 59 | Admitting: Nurse Practitioner

## 2023-08-12 ENCOUNTER — Telehealth: Payer: Self-pay | Admitting: Internal Medicine

## 2023-08-12 ENCOUNTER — Ambulatory Visit: Payer: Self-pay | Admitting: Internal Medicine

## 2023-08-12 MED ORDER — FLUCONAZOLE 150 MG PO TABS: 150.0000 mg | ORAL_TABLET | Freq: Every day | ORAL | 0 refills | Status: DC

## 2023-08-12 NOTE — Telephone Encounter (Signed)
 See nurse triage note.

## 2023-08-12 NOTE — Telephone Encounter (Signed)
   Chief Complaint: vaginal itching Symptoms: itching on outside of vagina area Frequency: x 1 week to 1.5 week Pertinent Negatives: Patient denies vaginal  Disposition: [] ED /[] Urgent Care (no appt availability in office) / [] Appointment(In office/virtual)/ []  [facility]/ [] Home Care/ [] Refused Recommended Disposition /[] [facility]/ [x]  Follow-up with PCP Additional Notes: pt just finished ABX which pt states states lead to yeast infection.  Pt would like to request medication. Reason for Disposition  [1] Symptoms of a yeast infection (i.e., itchy, white discharge, not bad smelling) AND [2] feels like prior vaginal yeast infections  Answer Assessment - Initial Assessment Questions 1. SYMPTOM: What's the main symptom you're concerned about? (e.g., pain, itching, dryness)     Itching on outside of vagina 2. LOCATION: Where is the  outside located? (e.g., inside/outside, left/right)     Outside of vagina 3. ONSET: When did the  itching  start?     1 week - 1.5 week 4. PAIN: Is there any pain? If Yes, ask: How bad is it? (Scale: 1-10; mild, moderate, severe)   -  MILD (1-3): Doesn't interfere with normal activities.    -  MODERATE (4-7): Interferes with normal activities (e.g., work or school) or awakens from sleep.     -  SEVERE (8-10): Excruciating pain, unable to do any normal activities.     Moderate 5. ITCHING: Is there any itching? If Yes, ask: How bad is it? (Scale: 1-10; mild, moderate, severe)     moderate 6. CAUSE: What do you think is causing the discharge? Have you had the same problem before? What happened then?     Was on ABX  7. OTHER SYMPTOMS: Do you have any other symptoms? (e.g., fever, itching, vaginal bleeding, pain with urination, injury to genital area, vaginal foreign body)     no 8. PREGNANCY: Is there any chance you are pregnant? When was your last menstrual period?     N/a  Protocols used: Vaginal  Symptoms-A-AH

## 2023-08-12 NOTE — Telephone Encounter (Signed)
 Copied from CRM [id number]. Topic: Clinical - Medication Refill >> Aug 12, 2023 12:03 PM Carmell SAUNDERS wrote: Most Recent Primary Care Visit:  Provider: VICCI SOBER B  Department: [facility]  Visit Type: OFFICE VISIT  Date: 05/14/2023  Medication: Patient requesting tablets for vaginal itching/yeast infection  Has the patient contacted their pharmacy? No. Says she hasn't had this medication in months.  Is this the correct pharmacy for this prescription? Yes If no, delete pharmacy and type the correct one.  This is the patient's preferred pharmacy:  [facility] - [place], [place] - [address] [place] [address] Phone: [phone] Fax: [phone]  Has the prescription been filled recently? No  Is the patient out of the medication? Yes  Has the patient been seen for an appointment in the last year OR does the patient have an upcoming appointment? Yes  Can we respond through MyChart? No  Agent: Please be advised that Rx refills may take up to 3 business days. We ask that you follow-up with your pharmacy.

## 2023-08-12 NOTE — Telephone Encounter (Signed)
 Called but no answer. LVM to call back.

## 2023-08-12 NOTE — Telephone Encounter (Signed)
 Let pt know that prescription for Diflucan  for vaginal yeast has been sent to her [facility].

## 2023-08-13 NOTE — Telephone Encounter (Signed)
 Called but no answer. LVM to call back.

## 2023-08-14 NOTE — Telephone Encounter (Signed)
 Called & spoke to the patient. Verified name & DOB. Informed that a prescirption for Diflucan  for vaginal yeast has been sent to her [facility]. Patient expressed verbal understanding.

## 2023-08-15 DIAGNOSIS — M25512 Pain in left shoulder: Secondary | ICD-10-CM | POA: Diagnosis not present

## 2023-08-20 ENCOUNTER — Ambulatory Visit: Payer: 59 | Attending: Internal Medicine | Admitting: Internal Medicine

## 2023-08-20 ENCOUNTER — Encounter: Payer: Self-pay | Admitting: Internal Medicine

## 2023-08-20 ENCOUNTER — Telehealth: Payer: Self-pay | Admitting: Internal Medicine

## 2023-08-20 VITALS — BP 135/63 | HR 68 | Temp 97.7°F | Ht 66.0 in | Wt 114.0 lb

## 2023-08-20 DIAGNOSIS — J449 Chronic obstructive pulmonary disease, unspecified: Secondary | ICD-10-CM | POA: Diagnosis not present

## 2023-08-20 DIAGNOSIS — I129 Hypertensive chronic kidney disease with stage 1 through stage 4 chronic kidney disease, or unspecified chronic kidney disease: Secondary | ICD-10-CM | POA: Diagnosis not present

## 2023-08-20 DIAGNOSIS — F17210 Nicotine dependence, cigarettes, uncomplicated: Secondary | ICD-10-CM | POA: Diagnosis not present

## 2023-08-20 DIAGNOSIS — Z122 Encounter for screening for malignant neoplasm of respiratory organs: Secondary | ICD-10-CM

## 2023-08-20 DIAGNOSIS — F17200 Nicotine dependence, unspecified, uncomplicated: Secondary | ICD-10-CM

## 2023-08-20 DIAGNOSIS — M79672 Pain in left foot: Secondary | ICD-10-CM | POA: Diagnosis not present

## 2023-08-20 DIAGNOSIS — N1831 Chronic kidney disease, stage 3a: Secondary | ICD-10-CM

## 2023-08-20 DIAGNOSIS — D649 Anemia, unspecified: Secondary | ICD-10-CM

## 2023-08-20 DIAGNOSIS — Z780 Asymptomatic menopausal state: Secondary | ICD-10-CM

## 2023-08-20 DIAGNOSIS — I10 Essential (primary) hypertension: Secondary | ICD-10-CM | POA: Diagnosis not present

## 2023-08-20 DIAGNOSIS — F321 Major depressive disorder, single episode, moderate: Secondary | ICD-10-CM

## 2023-08-20 DIAGNOSIS — Z1231 Encounter for screening mammogram for malignant neoplasm of breast: Secondary | ICD-10-CM

## 2023-08-20 DIAGNOSIS — E2839 Other primary ovarian failure: Secondary | ICD-10-CM

## 2023-08-20 DIAGNOSIS — F1010 Alcohol abuse, uncomplicated: Secondary | ICD-10-CM

## 2023-08-20 DIAGNOSIS — F322 Major depressive disorder, single episode, severe without psychotic features: Secondary | ICD-10-CM | POA: Insufficient documentation

## 2023-08-20 MED ORDER — DULERA 100-5 MCG/ACT IN AERO: 2.0000 | INHALATION_SPRAY | Freq: Two times a day (BID) | RESPIRATORY_TRACT | 6 refills | Status: DC

## 2023-08-20 MED ORDER — ATORVASTATIN CALCIUM 80 MG PO TABS: 80.0000 mg | ORAL_TABLET | Freq: Every day | ORAL | 0 refills | Status: DC

## 2023-08-20 MED ORDER — IRBESARTAN 75 MG PO TABS: 75.0000 mg | ORAL_TABLET | Freq: Every day | ORAL | 0 refills | Status: DC

## 2023-08-20 MED ORDER — ALBUTEROL SULFATE HFA 108 (90 BASE) MCG/ACT IN AERS: 2.0000 | INHALATION_SPRAY | Freq: Four times a day (QID) | RESPIRATORY_TRACT | 5 refills | Status: DC | PRN

## 2023-08-20 MED ORDER — TIOTROPIUM BROMIDE MONOHYDRATE 18 MCG IN CAPS: ORAL_CAPSULE | RESPIRATORY_TRACT | 99 refills | Status: AC

## 2023-08-20 MED ORDER — SERTRALINE HCL 50 MG PO TABS
50.0000 mg | ORAL_TABLET | Freq: Every day | ORAL | 1 refills | Status: DC
Start: 2023-08-20 — End: 2024-01-07

## 2023-08-20 NOTE — Telephone Encounter (Signed)
Patient seen today in office and is requesting : Albuterol Hyro X10 release 100 mg. Please advise.

## 2023-08-20 NOTE — Telephone Encounter (Signed)
Copied from CRM [id number]. Topic: Clinical - Medication Refill >> Aug 20, 2023  2:27 PM Eunice Blase wrote: Most Recent Primary Care Visit:  Provider: Jonah Blue B  [facility]: [facility]  Visit Type: OFFICE VISIT  Date: 08/20/2023  Medication: Albuterol Hyro X10 release 100 mg  Has the patient contacted their pharmacy? Yes (Agent: If no, request that the patient contact the pharmacy for the refill. If patient does not wish to contact the pharmacy document the reason why and proceed with request.) (Agent: If yes, when and what did the pharmacy advise?)Pharmacy need new prescription  Is this the correct pharmacy for this prescription? Yes If no, delete pharmacy and type the correct one.  This is the patient's preferred pharmacy:  [facility] - [place], [place] - [address] [place] [address] Phone: [phone] Fax: [phone]     Has the prescription been filled recently? Yes  Is the patient out of the medication? Yes  Has the patient been seen for an appointment in the last year OR does the patient have an upcoming appointment? Yes  Can we respond through MyChart? Yes  Agent: Please be advised that Rx refills may take up to 3 business days. We ask that you follow-up with your pharmacy.

## 2023-08-20 NOTE — Telephone Encounter (Signed)
RF sent on Albuterol inhaler

## 2023-08-20 NOTE — Addendum Note (Signed)
Addended by: Jonah Blue B on: 08/20/2023 06:40 PM   Modules accepted: Orders

## 2023-08-20 NOTE — Progress Notes (Signed)
Patient ID: Chanell Nadeau, female    DOB: Jan 12, 1956  MRN: [id number]  CC: Hypertension (HTN f/u. Marletta Lor 2 weeks ago - injured L foot, painful/Pain on R shoulder X4-6 mo/Yes to mammogram)   Subjective: Rever Pichette is a 68 y.o. female who presents for chronic ds management. Her concerns today include:  patient with history of HTN, HL, tob dep, COPD, OA left knee, EtOH abuse, chronic diarrhea, SIADH, ACD with macrocytosis, CKD 3, Hypercalcemia likely due to primary hyperparathyroid   Discussed the use of AI scribe software for clinical note transcription with the patient, who gave verbal consent to proceed.  History of Present Illness   The patient, with a history of COPD, alcohol use, and recent left knee replacement, presents with left foot pain after a fall two weeks ago. She tripped on her walker, which was too close to a wall. The pain is located in the middle of the foot and is noticeable when walking. She did not report this to her orthopedic surgeon during a recent follow-up visit for the knee replacement. -The patient has been doing her own physical therapy at home, performing exercises two to four times a day. She reports improved mobility after the knee replacement and no longer requires outpatient physical therapy. -Patient noted to have worsening anemia post knee replacement surgery with hemoglobin of 7.5.  Prior to surgery hemoglobin range was 9-10.  HTN: Reports compliance with taking Avapro 75 mg daily amlodipine 10 mg daily and hydralazine 10 mg 3 times a day.  Not on NSAIDs.  Last GFR was 58. Was referred to endocrinology for hypercalcemia but did not keep the appointment.  Recent calcium level was 10.  COPD/Tob dep: The patient also reports a recent cold last wk, which resulted in a cough and some congestion. She is currently using Spiriva and Dulera inhalers, for which she needs refills. Continues to smoke, currently about five cigarettes a day, but reports that  smoking exacerbates the numbness in her hands. Has appt with neurology next mth for eval of this  ETOH Abuse:  The patient has a history of alcohol use but reports minimal consumption recently, with the last drink being a 12-ounce beer a week ago.   The patient is also experiencing depression, for which she is taking Zoloft. Feels she may need a higher dose of the medication.  HM: She did not follow through with getting the mammogram and CT of the chest for lung cancer screening.  Dates she had too much going on before and after her recent knee replacement surgery  Patient Active Problem List   Diagnosis Date Noted   Moderately severe major depression (HCC) 08/20/2023   Primary osteoarthritis of left knee 06/24/2023   Normocytic anemia 04/08/2023   Loss of weight 04/08/2023   Vomiting without nausea 04/08/2023   Altered bowel habits 04/08/2023   Primary hyperparathyroidism (HCC) 03/26/2023   Arthritis of carpometacarpal (CMC) joint of left thumb 04/23/2022   Pain in left shoulder 04/23/2022   Hypercalcemia 12/31/2021   Allergic rhinitis 10/24/2020   Rotator cuff arthropathy of left shoulder 08/07/2020   Cervicalgia 08/07/2020   Chronic cough 03/21/2020   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   Tobacco dependence 01/30/2020   Diarrhea of infectious origin 01/30/2020   Hyponatremia 01/13/2020   Alcohol abuse 01/13/2020   Tobacco abuse 01/13/2020   Chronic diarrhea 01/13/2020   Hypophosphatemia 01/13/2020   Hypertension    Chronic obstructive pulmonary disease (HCC)  Alcohol use    Hypokalemia 01/12/2020   Tobacco use 10/06/2019   Unilateral primary osteoarthritis, left knee 09/08/2019   Pain in right shoulder 08/11/2019     Current Outpatient Medications on File Prior to Visit  Medication Sig Dispense Refill   albuterol (PROVENTIL) (2.5 MG/3ML) 0.083% nebulizer solution inhale THE contents of 1 vial PER nebulizer EVERY 6 HOURS AS NEEDED FOR  wheezing OR SHORTNESS OF BREATH 150 mL 5   amLODipine (NORVASC) 10 MG tablet TAKE 1 Tablet BY MOUTH ONCE DAILY FOR BLOOD PRESSURE 90 tablet 1   hydrALAZINE (APRESOLINE) 10 MG tablet Take 1 tablet (10 mg total) by mouth 3 (three) times daily. 90 tablet 4   pantoprazole (PROTONIX) 40 MG tablet Take 1 tablet (40 mg total) by mouth 2 (two) times daily. 60 tablet 2   Vitamin D, Cholecalciferol, 10 MCG (400 UNIT) CAPS Take 400 Int'l Units by mouth daily. 100 capsule 1   diclofenac Sodium (VOLTAREN) 1 % GEL Apply 2 g topically 4 (four) times daily. (Patient not taking: Reported on 08/20/2023) 100 g 1   ferrous sulfate 325 (65 FE) MG tablet Take 1 tablet (325 mg total) by mouth daily with breakfast. Patient to pay out of pocket if not covered by her insurance. (Patient not taking: Reported on 08/20/2023) 100 tablet 1   polyethylene glycol powder (MIRALAX) 17 GM/SCOOP powder Take 17 g by mouth daily. Mix with liquid as directed. (Patient not taking: Reported on 08/20/2023) 238 g 0   [DISCONTINUED] fluticasone (FLONASE) 50 MCG/ACT nasal spray Place 1 spray into both nostrils daily.     [DISCONTINUED] lovastatin (MEVACOR) 20 MG tablet Take 20 mg by mouth daily at 6 PM.     No current facility-administered medications on file prior to visit.    Allergies  Allergen Reactions   Chantix [Varenicline Tartrate]     Diarrhea and dizziness   Penicillins Diarrhea    Social History   Socioeconomic History   Marital status: Single    Spouse name: Not on file   Number of children: Not on file   Years of education: Not on file   Highest education level: Not on file  Occupational History   Not on file  Tobacco Use   Smoking status: Every Day    Current packs/day: 0.25    Types: Cigarettes   Smokeless tobacco: Never   Tobacco comments:    3 times a week - 10/24/2020    Buproprion helps  Vaping Use   Vaping status: Never Used  Substance and Sexual Activity   Alcohol use: Not Currently   Drug use: Yes     Types: Marijuana    Comment: last used 04/27/23   Sexual activity: Not Currently  Other Topics Concern   Not on file  Social History Narrative   Retired   Chief Executive Officer Drivers of Corporate investment banker Strain: Low Risk  (05/14/2023)   Overall Financial Resource Strain (CARDIA)    Difficulty of Paying Living Expenses: Not hard at all  Food Insecurity: No Food Insecurity (06/24/2023)   Hunger Vital Sign    Worried About Running Out of Food in the Last Year: Never true    Ran Out of Food in the Last Year: Never true  Transportation Needs: No Transportation Needs (06/24/2023)   PRAPARE - Administrator, Civil Service (Medical): No    Lack of Transportation (Non-Medical): No  Physical Activity: Inactive (05/14/2023)   Exercise Vital Sign    Days of  Exercise per Week: 0 days    Minutes of Exercise per Session: 0 min  Stress: No Stress Concern Present (05/14/2023)   [employer] of Occupational Health - Occupational Stress Questionnaire    Feeling of Stress : Not at all  Social Connections: Moderately Integrated (05/14/2023)   Social Connection and Isolation Panel [NHANES]    Frequency of Communication with Friends and Family: More than three times a week    Frequency of Social Gatherings with Friends and Family: More than three times a week    Attends Religious Services: More than 4 times per year    Active Member of Clubs or Organizations: Yes    Attends Banker Meetings: More than 4 times per year    Marital Status: Never married  Intimate Partner Violence: Not At Risk (06/24/2023)   Humiliation, Afraid, Rape, and Kick questionnaire    Fear of Current or Ex-Partner: No    Emotionally Abused: No    Physically Abused: No    Sexually Abused: No    Family History  Problem Relation Age of Onset   Kidney failure Mother    Aneurysm Father     Past Surgical History:  Procedure Laterality Date   COLONOSCOPY WITH PROPOFOL N/A 06/07/2014   Procedure:  COLONOSCOPY WITH PROPOFOL;  Surgeon: Willis Modena, MD;  Location: [facility];  Service: Endoscopy;  Laterality: N/A;   ESOPHAGOGASTRODUODENOSCOPY (EGD) WITH PROPOFOL N/A 06/07/2014   Procedure: ESOPHAGOGASTRODUODENOSCOPY (EGD) WITH PROPOFOL;  Surgeon: Willis Modena, MD;  Location: [facility];  Service: Endoscopy;  Laterality: N/A;   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   Procedure: TOTAL KNEE ARTHROPLASTY;  Surgeon: Joen Laura, MD;  Location: [facility];  Service: Orthopedics;  Laterality: Left;    ROS: Review of Systems Negative except as stated above  PHYSICAL EXAM: BP 135/63   Pulse 68   Temp 97.7 F (36.5 C) (Oral)   Ht 5\' 6"  (1.676 m)   Wt 114 lb (51.7 kg)   SpO2 100%   BMI 18.40 kg/m   Physical Exam  General appearance - alert, well appearing, and in no distress Mental status - normal mood, behavior, speech, dress, motor activity, and thought processes Neck - supple, no significant adenopathy Chest - clear to auscultation, no wheezes, rales or rhonchi, symmetric air entry Heart - normal rate, regular rhythm, normal S1, S2, no murmurs, rubs, clicks or gallops Musculoskeletal -ambulates with a walker. Left foot: No edema or erythema noted.  Mild point tenderness on the midfoot and ankle Extremities - peripheral pulses normal, no pedal edema, no clubbing or cyanosis     08/20/2023    9:35 AM 05/14/2023   12:02 PM 03/13/2023    9:28 AM  Depression screen PHQ 2/9  Decreased Interest 2 2 2   Down, Depressed, Hopeless 2 2 2   PHQ - 2 Score 4 4 4   Altered sleeping 2 2 0  Tired, decreased energy 2 2 2   Change in appetite 2 2 2   Feeling bad or failure about yourself  2 2 2   Trouble concentrating 2 2 2   Moving slowly or fidgety/restless 2 0 2  Suicidal thoughts 0 0 0  PHQ-9 Score 16 14 14   Difficult doing work/chores Very difficult Not difficult at all Somewhat difficult       Latest Ref Rng & Units 06/25/2023    5:12 AM 06/23/2023   12:02 PM 03/25/2023    10:37 AM  CMP  Glucose 70 - 99 mg/dL 244  86  BUN 8 - 23 mg/dL 18  15    Creatinine 1.61 - 1.00 mg/dL 0.96  0.45    Sodium 409 - 145 mmol/L 134  138    Potassium 3.5 - 5.1 mmol/L 4.2  3.8    Chloride 98 - 111 mmol/L 104  108    CO2 22 - 32 mmol/L 21  23    Calcium 8.9 - 10.3 mg/dL 81.1  91.4  78.2   Total Protein 6.5 - 8.1 g/dL  7.1    Total Bilirubin <1.2 mg/dL  0.2    Alkaline Phos 38 - 126 U/L  93    AST 15 - 41 U/L  20    ALT 0 - 44 U/L  16     Lipid Panel     Component Value Date/Time   CHOL 155 03/20/2021 1454   TRIG 258 (H) 03/20/2021 1454   HDL 82 03/20/2021 1454   CHOLHDL 1.9 03/20/2021 1454   CHOLHDL 1.5 01/15/2020 0258   VLDL 23 01/15/2020 0258   LDLCALC 35 03/20/2021 1454    CBC    Component Value Date/Time   WBC 13.1 (H) 06/25/2023 0512   RBC 2.24 (L) 06/25/2023 0512   HGB 7.5 (L) 06/25/2023 0512   HGB 10.4 (L) 03/13/2023 1055   HCT 22.3 (L) 06/25/2023 0512   HCT 31.6 (L) 03/13/2023 1055   PLT 203 06/25/2023 0512   PLT 367 03/13/2023 1055   MCV 99.6 06/25/2023 0512   MCV 97 03/13/2023 1055   MCH 33.5 06/25/2023 0512   MCHC 33.6 06/25/2023 0512   RDW 13.9 06/25/2023 0512   RDW 13.3 03/13/2023 1055   LYMPHSABS 1.3 06/23/2023 1202   MONOABS 0.4 06/23/2023 1202   EOSABS 0.1 06/23/2023 1202   BASOSABS 0.0 06/23/2023 1202    ASSESSMENT AND PLAN: 1. Essential hypertension (Primary) Repeat blood pressure today is not at goal but improved compared to first reading.  She will continue current dose of Avapro, amlodipine and hydralazine. - CBC - Comprehensive metabolic panel - irbesartan (AVAPRO) 75 MG tablet; Take 1 tablet (75 mg total) by mouth daily.  Dispense: 90 tablet; Refill: 0  2. Acute foot pain, left - DG Foot Complete Left; Future - DG Ankle Complete Left; Future  3. Moderately severe major depression (HCC) Patient agreeable for [place] to increase the Zoloft to 50 mg once a day.  4. Alcohol use disorder, mild, abuse Commended her on cutting  back.  Went over with her how much is too much in 1 setting for female.  Should drink no more than one 12 oz beer in a 24 hr period  5. Stage 3a chronic kidney disease (HCC) Continue to observe.  6. Chronic obstructive pulmonary disease, unspecified COPD type (HCC) - tiotropium (SPIRIVA HANDIHALER) 18 MCG inhalation capsule; PLACE 1 CAPSULE INTO INHALER AND INHALE DAILY  Dispense: 90 capsule; Refill: PRN - mometasone-formoterol (DULERA) 100-5 MCG/ACT AERO; Inhale 2 puffs into the lungs 2 (two) times daily.  Dispense: 13 g; Refill: 6  7. Chronic anemia Hemoglobin has been between 9-10 prior to knee replacement surgery.  It was 7.5 postsurgery.  We will recheck CBC today.  8. Tobacco dependence Encourage smoking cessation.  9. Encounter for screening mammogram for malignant neoplasm of breast - MM Digital Screening; Future  10. Postmenopausal estrogen deficiency - DG Bone Density; Future  11. Screening for lung cancer - CT CHEST LUNG CA SCREEN LOW DOSE W/O CM; Future  Addendum: Calcium level is 12.  Will add vitamin  D level.  Will try to get her in with endocrinology again.  Patient was given the opportunity to ask questions.  Patient verbalized understanding of the plan and was able to repeat key elements of the plan.   This documentation was completed using Paediatric nurse.  Any transcriptional errors are unintentional.  Orders Placed This Encounter  Procedures   DG Bone Density   MM Digital Screening   CT CHEST LUNG CA SCREEN LOW DOSE W/O CM   DG Foot Complete Left   DG Ankle Complete Left   CBC   Comprehensive metabolic panel     Requested Prescriptions   Signed Prescriptions Disp Refills   tiotropium (SPIRIVA HANDIHALER) 18 MCG inhalation capsule 90 capsule PRN    Sig: PLACE 1 CAPSULE INTO INHALER AND INHALE DAILY   mometasone-formoterol (DULERA) 100-5 MCG/ACT AERO 13 g 6    Sig: Inhale 2 puffs into the lungs 2 (two) times daily.   irbesartan  (AVAPRO) 75 MG tablet 90 tablet 0    Sig: Take 1 tablet (75 mg total) by mouth daily.   atorvastatin (LIPITOR) 80 MG tablet 90 tablet 0    Sig: Take 1 tablet (80 mg total) by mouth daily.   sertraline (ZOLOFT) 50 MG tablet 90 tablet 1    Sig: Take 1 tablet (50 mg total) by mouth daily.    Return in about 4 months (around 12/18/2023) for Medicare Wellness Visit in 2   wks after 09/03/22 with CMA.  Jonah Blue, MD, FACP

## 2023-08-21 ENCOUNTER — Other Ambulatory Visit: Payer: Self-pay | Admitting: Internal Medicine

## 2023-08-21 DIAGNOSIS — E875 Hyperkalemia: Secondary | ICD-10-CM

## 2023-08-21 LAB — COMPREHENSIVE METABOLIC PANEL
ALT: 15 [IU]/L (ref 0–32)
AST: 20 [IU]/L (ref 0–40)
Albumin: 4.7 g/dL (ref 3.9–4.9)
Alkaline Phosphatase: 121 [IU]/L (ref 44–121)
BUN/Creatinine Ratio: 14 (ref 12–28)
BUN: 14 mg/dL (ref 8–27)
Bilirubin Total: 0.3 mg/dL (ref 0.0–1.2)
CO2: 19 mmol/L — ABNORMAL LOW (ref 20–29)
Calcium: 12 mg/dL — ABNORMAL HIGH (ref 8.7–10.3)
Chloride: 103 mmol/L (ref 96–106)
Creatinine, Ser: 1.03 mg/dL — ABNORMAL HIGH (ref 0.57–1.00)
Globulin, Total: 2.3 g/dL (ref 1.5–4.5)
Glucose: 98 mg/dL (ref 70–99)
Potassium: 5.6 mmol/L — ABNORMAL HIGH (ref 3.5–5.2)
Sodium: 137 mmol/L (ref 134–144)
Total Protein: 7 g/dL (ref 6.0–8.5)
eGFR: 59 mL/min/{1.73_m2} — ABNORMAL LOW (ref >59)

## 2023-08-21 LAB — CBC
Hematocrit: 32.4 % — ABNORMAL LOW (ref 34.0–46.6)
Hemoglobin: 10.3 g/dL — ABNORMAL LOW (ref 11.1–15.9)
MCH: 31.6 pg (ref 26.6–33.0)
MCHC: 31.8 g/dL (ref 31.5–35.7)
MCV: 99 fL — ABNORMAL HIGH (ref 79–97)
Platelets: 416 10*3/uL (ref 150–450)
RBC: 3.26 x10E6/uL — ABNORMAL LOW (ref 3.77–5.28)
RDW: 13.2 % (ref 11.7–15.4)
WBC: 6.3 10*3/uL (ref 3.4–10.8)

## 2023-08-21 NOTE — Progress Notes (Signed)
Let patient know that potassium level is elevated.  Try to cut back on potassium rich foods like bananas, oranges and orange juice.  Please return to the lab early next week to have potassium level rechecked.  High potassium can cause the heart to go into abnormal rhythms.  Calcium level is elevated again.  Will have lab check vitamin D level.  I have resubmitted the referral to try to get her in with the endocrinologist for further evaluation of this. -she is still anemic but improved compared to when she was discharged from the hospital. Kidney function not 100% but stable.

## 2023-08-21 NOTE — Telephone Encounter (Signed)
Call to patient to advised that medication has been sent to her pharmacy. Unable to reach or leave message. Please advise patient when she call back.

## 2023-08-21 NOTE — Addendum Note (Signed)
Addended by: Jonah Blue B on: 08/21/2023 01:38 PM   Modules accepted: Orders

## 2023-08-24 LAB — SPECIMEN STATUS REPORT

## 2023-08-24 LAB — VITAMIN D 25 HYDROXY (VIT D DEFICIENCY, FRACTURES): Vit D, 25-Hydroxy: 36.1 ng/mL (ref 30.0–100.0)

## 2023-08-25 ENCOUNTER — Ambulatory Visit: Payer: 59 | Attending: Internal Medicine

## 2023-08-25 DIAGNOSIS — E875 Hyperkalemia: Secondary | ICD-10-CM

## 2023-08-26 ENCOUNTER — Telehealth: Payer: Self-pay | Admitting: Internal Medicine

## 2023-08-26 DIAGNOSIS — E875 Hyperkalemia: Secondary | ICD-10-CM

## 2023-08-26 DIAGNOSIS — I10 Essential (primary) hypertension: Secondary | ICD-10-CM

## 2023-08-26 LAB — BASIC METABOLIC PANEL
BUN/Creatinine Ratio: 12 (ref 12–28)
BUN: 15 mg/dL (ref 8–27)
CO2: 19 mmol/L — ABNORMAL LOW (ref 20–29)
Calcium: 12 mg/dL — ABNORMAL HIGH (ref 8.7–10.3)
Chloride: 105 mmol/L (ref 96–106)
Creatinine, Ser: 1.21 mg/dL — ABNORMAL HIGH (ref 0.57–1.00)
Glucose: 84 mg/dL (ref 70–99)
Potassium: 5.9 mmol/L — ABNORMAL HIGH (ref 3.5–5.2)
Sodium: 136 mmol/L (ref 134–144)
eGFR: 49 mL/min/{1.73_m2} — ABNORMAL LOW (ref >59)

## 2023-08-26 LAB — PTH, INTACT AND CALCIUM: PTH: 83 pg/mL — ABNORMAL HIGH (ref 15–65)

## 2023-08-26 MED ORDER — LOKELMA 5 G PO PACK: PACK | ORAL | 0 refills | Status: DC

## 2023-08-26 MED ORDER — HYDRALAZINE HCL 10 MG PO TABS: 20.0000 mg | ORAL_TABLET | Freq: Two times a day (BID) | ORAL | 6 refills | Status: DC

## 2023-08-26 NOTE — Telephone Encounter (Signed)
Called & spoke to Anna Mcguire and informed to discontinue the Avapro. Anna Mcguire confirmed that medication will be discontinued.

## 2023-08-26 NOTE — Telephone Encounter (Signed)
Phone call please the patient today to go over lab results.  Patient was identified using 2 patient identifiers.  Patient informed that her potassium level is still elevated.  Patient states she has not been eating oranges, none as or drinking orange juice. Kidney function has also worsened. Recommendations: -Stop Avapro. -Increase hydralazine instead.  Hydralazine prescribed as 10 mg 3 times a day but patient tells me she has only been taking 10 mg twice a day.  Advised to increase to 20 mg twice a day meaning she will take 2 of the 10 mg tablets twice a day. -Prescription sent for one-time dose of Lokelma.  Advised to take today.  Patient states her pharmacy will deliver. -Return to the lab early next week for repeat chemistry check.  Will also do SPEP at that time for further workup of hypercalcemia.  I had patient repeat back each instruction to me as it was given.

## 2023-08-28 ENCOUNTER — Telehealth: Payer: Self-pay

## 2023-08-28 ENCOUNTER — Other Ambulatory Visit: Payer: Self-pay

## 2023-08-28 MED ORDER — LOKELMA 5 G PO PACK: PACK | ORAL | 0 refills | Status: DC

## 2023-08-28 NOTE — Telephone Encounter (Signed)
Copied from CRM [id number]. Topic: Clinical - Prescription Issue >> Aug 28, 2023 10:28 AM Geroge Baseman wrote: Reason for CRM: Pharmacy calling about sodium zirconium cyclosilicate (LOKELMA) 5 g packet, prescription was put in as one packet but pharmacy dispenses them in boxes as packs of 30. Wanting to get an updated prescription over to the pharmacy that shows this amount. Adaly, [phone]

## 2023-08-28 NOTE — Telephone Encounter (Addendum)
Resent to pharmacy spoke with Adaly, Refill dispense changed to one each

## 2023-09-01 ENCOUNTER — Other Ambulatory Visit (HOSPITAL_COMMUNITY): Payer: Self-pay

## 2023-09-01 ENCOUNTER — Ambulatory Visit: Payer: 59

## 2023-09-02 ENCOUNTER — Inpatient Hospital Stay: Admission: RE | Admit: 2023-09-02 | Payer: 59 | Source: Ambulatory Visit

## 2023-09-03 ENCOUNTER — Encounter: Payer: Self-pay | Admitting: Neurology

## 2023-09-03 ENCOUNTER — Ambulatory Visit: Payer: 59 | Admitting: Neurology

## 2023-09-09 ENCOUNTER — Ambulatory Visit: Payer: 59

## 2023-09-17 DIAGNOSIS — M1712 Unilateral primary osteoarthritis, left knee: Secondary | ICD-10-CM | POA: Diagnosis not present

## 2023-09-21 ENCOUNTER — Other Ambulatory Visit: Payer: Self-pay | Admitting: Internal Medicine

## 2023-09-28 ENCOUNTER — Ambulatory Visit
Admission: RE | Admit: 2023-09-28 | Discharge: 2023-09-28 | Disposition: A | Discharge status: Home / Self Care | Source: Ambulatory Visit | Attending: Internal Medicine | Admitting: Internal Medicine

## 2023-09-28 ENCOUNTER — Ambulatory Visit: Payer: Self-pay

## 2023-09-28 DIAGNOSIS — M79672 Pain in left foot: Secondary | ICD-10-CM

## 2023-09-28 DIAGNOSIS — M25572 Pain in left ankle and joints of left foot: Secondary | ICD-10-CM | POA: Diagnosis not present

## 2023-09-28 NOTE — Telephone Encounter (Signed)
 Spoke with patient . Patient reports that she is having  additional pain in her left side, right foot and right shoulder. Reports that she has had no more falls. All new s/s are from her first fall a couple of weeks ago. Patient is requesting left side, right foot and right shoulder x-ray's be added to current x-ray request.

## 2023-09-28 NOTE — Telephone Encounter (Signed)
  Chief Complaint: Requesting additional x-rays Symptoms: pain to back, shoulders and foot Frequency: couple of weeks Pertinent Negatives: Patient denies fever Disposition: [] ED /[] Urgent Care (no appt availability in office) / [] Appointment(In office/virtual)/ []  [place] [facility]/ [] Home Care/ [] Refused Recommended Disposition /[] [place] [facility]/ []  Follow-up with PCP Additional Notes: patient calling with requests for additional x-rays of left back, right foot and right shoulder. Patient states she feel a couple of weeks ago and has had generalized body pain since. Patient states there are orders in the systems for x-rays of left foot and left shoulder. Patient is wanting additional x-rays to be placed so "I can get them all done today." Patient endorses pain but states it has been there. Patient is requesting a phone call back about additional x-rays. Patient verbalized understanding and all questions answered.    Copied from CRM [id number]. Topic: Clinical - Red Word Triage >> Sep 28, 2023  9:56 AM Desma Mcgregor wrote: Red Word that prompted transfer to Nurse Triage: Patient fell a couple weeks ago and her left side of back, right foot and shoulder are in pain and swollen. Patient says she can't hardly walk. Patient initially called to request a test/lab be ordered for imaging for the back, right foot, and shoulder. Reason for Disposition  [1] Caller has URGENT question AND [2] triager unable to answer question  Answer Assessment - Initial Assessment Questions 1. MECHANISM: "How did the fall happen?"     Patient tripped on floor with her foot getting stuck in a chair.  2. DOMESTIC VIOLENCE AND ELDER ABUSE SCREENING: "Did you fall because someone pushed you or tried to hurt you?" If Yes, ask: "Are you safe now?"     No 3. ONSET: "When did the fall happen?" (e.g., minutes, hours, or days ago)     Fall happened a couple of weeks ago 4. LOCATION: "What part of the body hit the ground?"  (e.g., back, buttocks, head, hips, knees, hands, head, stomach)     Left side of back, right foot and shoulder. Patient also endorses pain to left shoulder and left foot.  5. INJURY: "Did you hurt (injure) yourself when you fell?" If Yes, ask: "What did you injure? Tell me more about this?" (e.g., body area; type of injury; pain severity)"     Yes 6. PAIN: "Is there any pain?" If Yes, ask: "How bad is the pain?" (e.g., Scale 1-10; or mild,  moderate, severe)   - NONE (0): No pain   - MILD (1-3): Doesn't interfere with normal activities    - MODERATE (4-7): Interferes with normal activities or awakens from sleep    - SEVERE (8-10): Excruciating pain, unable to do any normal activities      7 out of 10 7. SIZE: For cuts, bruises, or swelling, ask: "How large is it?" (e.g., inches or centimeters)      No 9. OTHER SYMPTOMS: "Do you have any other symptoms?" (e.g., dizziness, fever, weakness; new onset or worsening).      no 10. CAUSE: "What do you think caused the fall (or falling)?" (e.g., tripped, dizzy spell)       tripped  Protocols used: Falls and [facility]

## 2023-09-28 NOTE — Telephone Encounter (Signed)
 Just seeing this message and was not able to add the additional films she requested. Will get back to her with results of x-rays done today once report is sent from radiologist.

## 2023-09-29 NOTE — Telephone Encounter (Signed)
 Noted.

## 2023-10-07 ENCOUNTER — Other Ambulatory Visit: Payer: Self-pay | Admitting: Internal Medicine

## 2023-10-07 DIAGNOSIS — I10 Essential (primary) hypertension: Secondary | ICD-10-CM

## 2023-10-08 ENCOUNTER — Other Ambulatory Visit: Payer: Self-pay | Admitting: Family Medicine

## 2023-10-08 DIAGNOSIS — M79672 Pain in left foot: Secondary | ICD-10-CM

## 2023-10-21 ENCOUNTER — Ambulatory Visit (HOSPITAL_COMMUNITY)
Admission: EM | Admit: 2023-10-21 | Discharge: 2023-10-21 | Disposition: A | Discharge status: Home / Self Care | Attending: Family Medicine | Admitting: Family Medicine

## 2023-10-21 ENCOUNTER — Ambulatory Visit (INDEPENDENT_AMBULATORY_CARE_PROVIDER_SITE_OTHER)

## 2023-10-21 ENCOUNTER — Encounter (HOSPITAL_COMMUNITY): Payer: Self-pay

## 2023-10-21 ENCOUNTER — Ambulatory Visit: Payer: Self-pay

## 2023-10-21 DIAGNOSIS — M2011 Hallux valgus (acquired), right foot: Secondary | ICD-10-CM | POA: Diagnosis not present

## 2023-10-21 DIAGNOSIS — M19071 Primary osteoarthritis, right ankle and foot: Secondary | ICD-10-CM | POA: Diagnosis not present

## 2023-10-21 DIAGNOSIS — M2041 Other hammer toe(s) (acquired), right foot: Secondary | ICD-10-CM | POA: Diagnosis not present

## 2023-10-21 DIAGNOSIS — M25511 Pain in right shoulder: Secondary | ICD-10-CM

## 2023-10-21 DIAGNOSIS — M79671 Pain in right foot: Secondary | ICD-10-CM

## 2023-10-21 DIAGNOSIS — M1711 Unilateral primary osteoarthritis, right knee: Secondary | ICD-10-CM | POA: Diagnosis not present

## 2023-10-21 DIAGNOSIS — M25561 Pain in right knee: Secondary | ICD-10-CM | POA: Diagnosis not present

## 2023-10-21 MED ORDER — TIZANIDINE HCL 4 MG PO TABS: 4.0000 mg | ORAL_TABLET | Freq: Three times a day (TID) | ORAL | 0 refills | Status: DC | PRN

## 2023-10-21 NOTE — Telephone Encounter (Signed)
  Chief Complaint: slip and fall Symptoms: severe: bilateral hip pain, right shoulder pain Frequency: occurred Friday night Pertinent Negatives: Patient denies head injury, dizziness, numbness/tingling Disposition: [] ED /[x] Urgent Care (no appt availability in office) / [] Appointment(In office/virtual)/ []  [place] [facility]/ [] Home Care/ [] Refused Recommended Disposition /[] [place] [facility]/ []  Follow-up with PCP Additional Notes: Patient states she had a slip and fall Friday when getting out of bed due to her bed sheets. She states her sister was able to help her up from the fall. She is complaining of bilateral hip and right shoulder pain. No available appts with PCP or at [facility] clinic. Offered patient to be seen at [facility] available locations today and she states she will go to urgent care instead. Patient states she thinks she may need X rays and pain medicine.  Copied from CRM [id number]. Topic: Clinical - Red Word Triage >> Oct 21, 2023 11:09 AM Patsy Lager T wrote: [facility] that prompted transfer to Nurse Triage: patient said she fell on her right side hurting her right hip and left hip. Reason for Disposition  [1] MODERATE weakness (i.e., interferes with work, school, normal activities) AND [2] new-onset or worsening  Answer Assessment - Initial Assessment Questions 1. MECHANISM: "How did the fall happen?"     Patient states she slipped out of the bed due to her sheets. Slip and fall, landed on right side.  2. DOMESTIC VIOLENCE AND ELDER ABUSE SCREENING: "Did you fall because someone pushed you or tried to hurt you?" If Yes, ask: "Are you safe now?"     Denies, states she is safe.  3. ONSET: "When did the fall happen?" (e.g., minutes, hours, or days ago)     Friday night.  4. LOCATION: "What part of the body hit the ground?" (e.g., back, buttocks, head, hips, knees, hands, head, stomach)     Right hip, right shoulder, left hip.  5. INJURY: "Did you hurt (injure) yourself  when you fell?" If Yes, ask: "What did you injure? Tell me more about this?" (e.g., body area; type of injury; pain severity)"     She states when she fell she landed on her right side. Right hip, right shoulder, left hip.  6. PAIN: "Is there any pain?" If Yes, ask: "How bad is the pain?" (e.g., Scale 1-10; or mild,  moderate, severe)   - NONE (0): No pain   - MILD (1-3): Doesn't interfere with normal activities    - MODERATE (4-7): Interferes with normal activities or awakens from sleep    - SEVERE (8-10): Excruciating pain, unable to do any normal activities      8/10. She states she has taken aspirin today for pain.  7. SIZE: For cuts, bruises, or swelling, ask: "How large is it?" (e.g., inches or centimeters)      Bruising and swelling on left and right hip, and right shoulder. She states its a couple inches.  8. PREGNANCY: "Is there any chance you are pregnant?" "When was your last menstrual period?"     N/A.  9. OTHER SYMPTOMS: "Do you have any other symptoms?" (e.g., dizziness, fever, weakness; new onset or worsening).      Denies.  10. CAUSE: "What do you think caused the fall (or falling)?" (e.g., tripped, dizzy spell)       Trip/slip and fall.  Protocols used: Falls and [facility]

## 2023-10-21 NOTE — Telephone Encounter (Signed)
 Noted.

## 2023-10-21 NOTE — ED Triage Notes (Signed)
 Patient states she was drinking Friday night and when she tried to get in bed she fell. Patient with c/o right shoulder pain, right knee and right foot pain.

## 2023-10-24 NOTE — ED Provider Notes (Signed)
 [facility] CARE CENTER   [id number] 10/21/23 Arrival Time: 1347  ASSESSMENT & PLAN:  1. Acute pain of right shoulder   2. Acute pain of right knee   3. Right foot pain     I have personally viewed and independently interpreted the imaging studies ordered this visit. R knee: no fx noted. R foot: no fx noted. R shoulder: no fx or dislocation noted.  Activities as tolerated. Will remain sore for several days. Trial of: Discharge Medication List as of 10/21/2023  5:14 PM     START taking these medications   Details  tiZANidine  (ZANAFLEX ) 4 MG tablet Take 1 tablet (4 mg total) by mouth every 8 (eight) hours as needed for muscle spasms., Starting Wed 10/21/2023, Normal       OTC analgesics as needed. Orders Placed This Encounter  Procedures   DG Shoulder Right   DG Foot Complete Right   DG Knee Complete 4 Views Right    Recommend:  Follow-up Information     Schedule an appointment as soon as possible for a visit  with Lawrance Presume, MD.   Specialty: Internal Medicine Why: For follow up. Contact information: [address] [place] [address] [phone]         [facility] [place].   Specialty: Urgent Care Why: As needed. Contact information: [address] [place]  [id number] [phone]               Reviewed expectations re: course of current medical issues. Questions answered. Outlined signs and symptoms indicating need for more acute intervention. Patient verbalized understanding. After Visit Summary given.  SUBJECTIVE: History from: patient. Anna Mcguire is a 68 y.o. female who reports Patient states she was drinking a few nights ago; fell trying to get into bed. Now with right shoulder pain, right knee and right foot pain. "Just still sore". Denies head injury. No extremity sensation changes or weakness. Ambulatory here.   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   Procedure: TOTAL KNEE ARTHROPLASTY;  Surgeon: Murleen Arms, MD;  Location: [facility];  Service: Orthopedics;  Laterality: Left;      OBJECTIVE:  Vitals:   10/21/23 1544  BP: (!) 175/64  Pulse: 82  Resp: 18  Temp: 98 F (36.7 C)  TempSrc: Oral  SpO2: 96%    General appearance: alert; no distress HEENT: [place]; AT Neck: supple with FROM Resp: unlabored respirations Extremities: R upper and lower: generalized TTP over R shoulder, knee, and foot; no gross changes; minimal swelling around knee; all these joints with FROM, both UE with intact sensation and normal cap refill Skin: warm and dry; no visible rashes Neurologic: gait normal; normal sensation and strength of all extremities Psychological: alert and cooperative; normal mood and affect  Imaging: DG Knee Complete 4 Views Right Result Date: 10/21/2023 CLINICAL DATA:  Pain after fall.  Right knee pain. EXAM: RIGHT KNEE - COMPLETE 4+ VIEW COMPARISON:  Most recent radiograph 04/15/2019 FINDINGS: No evidence of fracture, dislocation, or joint effusion. Medial tibiofemoral joint space narrowing and peripheral spurring. No  erosions. Soft tissues are unremarkable. IMPRESSION: 1. No fracture or subluxation of the right knee. 2. Mild osteoarthritis. Electronically Signed   By: Chadwick Colonel M.D.   On: 10/21/2023 18:58   DG Foot Complete Right Result Date: 10/21/2023 CLINICAL DATA:  Pain after fall.  Right foot pain. EXAM: RIGHT FOOT COMPLETE - 3+ VIEW COMPARISON:  Most recent foot radiograph 04/14/2020  FINDINGS: There is no evidence of fracture or dislocation. Hammertoe deformity of the third and fourth toes. Mild hallux valgus with moderate degenerative change of the first metatarsal phalangeal joint. No erosions. Soft tissues are unremarkable. IMPRESSION: 1. No fracture or dislocation of the right foot. 2. Mild hallux valgus with moderate degenerative change of the first metatarsophalangeal joint. 3. Hammertoe deformity of the third and fourth toes. Electronically Signed   By: Chadwick Colonel M.D.   On: 10/21/2023 18:57   DG Shoulder Right Result Date: 10/21/2023 CLINICAL DATA:  Pain after fall.  Right shoulder pain. EXAM: RIGHT SHOULDER - 2+ VIEW COMPARISON:  Most recent right shoulder radiograph 04/14/2020 FINDINGS: There is no evidence of fracture or dislocation. Mild glenohumeral joint space narrowing. No erosive or bony destructive change. Soft tissues are unremarkable. IMPRESSION: 1. No fracture or dislocation of the right shoulder. 2. Mild glenohumeral joint space narrowing. Electronically Signed   By: Chadwick Colonel M.D.   On: 10/21/2023 18:56      Allergies  Allergen Reactions   Chantix [Varenicline Tartrate]     Diarrhea and dizziness   Penicillins Diarrhea    Past Medical History:  Diagnosis Date   Acid reflux    Anxiety    Arthritis    COPD (chronic obstructive pulmonary disease) (HCC)    Depression    Dyspnea    Hepatitis    Hypertension    Social History   Socioeconomic History   Marital status: Single    Spouse name: Not on file   Number of children: Not on file   Years of education: Not on file   Highest education level: Not on file  Occupational History   Not on file  Tobacco Use   Smoking status: Every Day    Current packs/day: 0.25    Types: Cigarettes   Smokeless tobacco: Never   Tobacco comments:    3 times a week - 10/24/2020    Buproprion helps  Vaping Use   Vaping status: Never Used  Substance and Sexual Activity   Alcohol use: Not  Currently   Drug use: Yes    Types: Marijuana    Comment: last used 04/27/23   Sexual activity: Not Currently  Other Topics Concern   Not on file  Social History Narrative   Retired   Chief Executive Officer Drivers of Corporate investment banker Strain: Low Risk  (05/14/2023)   Overall Financial Resource Strain (CARDIA)    Difficulty of Paying Living Expenses: Not hard at all  Food Insecurity: No Food Insecurity (06/24/2023)   Hunger Vital Sign    Worried About Running Out of Food in the Last Year: Never true    Ran Out of Food in the Last Year: Never true  Transportation Needs: No Transportation Needs (06/24/2023)   PRAPARE - Administrator, Civil Service (Medical): No    Lack of Transportation (Non-Medical): No  Physical Activity: Inactive (05/14/2023)   Exercise Vital Sign    Days of Exercise per Week: 0 days    Minutes of Exercise per Session: 0 min  Stress: No Stress Concern Present (  05/14/2023)   [place] [employer] - Occupational Stress Questionnaire    Feeling of Stress : Not at all  Social Connections: Moderately Integrated (05/14/2023)   Social Connection and Isolation Panel [NHANES]    Frequency of Communication with Friends and Family: More than three times a week    Frequency of Social Gatherings with Friends and Family: More than three times a week    Attends Religious Services: More than 4 times per year    Active Member of Clubs or Organizations: Yes    Attends Engineer, structural: More than 4 times per year    Marital Status: Never married   Family History  Problem Relation Age of Onset   Kidney failure Mother    Aneurysm Father    Past Surgical History:  Procedure Laterality Date   COLONOSCOPY WITH PROPOFOL  N/A 06/07/2014   Procedure: COLONOSCOPY WITH PROPOFOL ;  Surgeon: Evangeline Hilts, MD;  Location: [facility];  Service: Endoscopy;  Laterality: N/A;   ESOPHAGOGASTRODUODENOSCOPY (EGD) WITH PROPOFOL  N/A 06/07/2014   Procedure:  ESOPHAGOGASTRODUODENOSCOPY (EGD) WITH PROPOFOL ;  Surgeon: Evangeline Hilts, MD;  Location: [facility];  Service: Endoscopy;  Laterality: N/A;   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

## 2023-11-02 ENCOUNTER — Ambulatory Visit: Admitting: Podiatry

## 2023-11-27 ENCOUNTER — Ambulatory Visit: Payer: Self-pay

## 2023-11-27 ENCOUNTER — Telehealth (HOSPITAL_COMMUNITY): Payer: Self-pay | Admitting: *Deleted

## 2023-11-27 ENCOUNTER — Ambulatory Visit (INDEPENDENT_AMBULATORY_CARE_PROVIDER_SITE_OTHER)

## 2023-11-27 ENCOUNTER — Encounter (HOSPITAL_COMMUNITY): Payer: Self-pay | Admitting: *Deleted

## 2023-11-27 ENCOUNTER — Other Ambulatory Visit: Payer: Self-pay

## 2023-11-27 ENCOUNTER — Ambulatory Visit: Admitting: Internal Medicine

## 2023-11-27 ENCOUNTER — Ambulatory Visit (HOSPITAL_COMMUNITY)
Admission: EM | Admit: 2023-11-27 | Discharge: 2023-11-27 | Disposition: A | Discharge status: Home / Self Care | Attending: Emergency Medicine | Admitting: Emergency Medicine

## 2023-11-27 DIAGNOSIS — M25511 Pain in right shoulder: Secondary | ICD-10-CM | POA: Diagnosis not present

## 2023-11-27 DIAGNOSIS — M25462 Effusion, left knee: Secondary | ICD-10-CM | POA: Diagnosis not present

## 2023-11-27 DIAGNOSIS — J441 Chronic obstructive pulmonary disease with (acute) exacerbation: Secondary | ICD-10-CM

## 2023-11-27 DIAGNOSIS — W19XXXA Unspecified fall, initial encounter: Secondary | ICD-10-CM

## 2023-11-27 DIAGNOSIS — M79642 Pain in left hand: Secondary | ICD-10-CM

## 2023-11-27 DIAGNOSIS — M25562 Pain in left knee: Secondary | ICD-10-CM

## 2023-11-27 DIAGNOSIS — J449 Chronic obstructive pulmonary disease, unspecified: Secondary | ICD-10-CM | POA: Diagnosis not present

## 2023-11-27 DIAGNOSIS — Z96652 Presence of left artificial knee joint: Secondary | ICD-10-CM | POA: Diagnosis not present

## 2023-11-27 DIAGNOSIS — R059 Cough, unspecified: Secondary | ICD-10-CM | POA: Diagnosis not present

## 2023-11-27 DIAGNOSIS — M1812 Unilateral primary osteoarthritis of first carpometacarpal joint, left hand: Secondary | ICD-10-CM | POA: Diagnosis not present

## 2023-11-27 DIAGNOSIS — R0602 Shortness of breath: Secondary | ICD-10-CM | POA: Diagnosis not present

## 2023-11-27 MED ORDER — AMOXICILLIN-POT CLAVULANATE 875-125 MG PO TABS: 1.0000 | ORAL_TABLET | Freq: Two times a day (BID) | ORAL | 0 refills | Status: DC

## 2023-11-27 MED ORDER — ALBUTEROL SULFATE (2.5 MG/3ML) 0.083% IN NEBU: 2.5000 mg | INHALATION_SOLUTION | Freq: Four times a day (QID) | RESPIRATORY_TRACT | 0 refills | Status: DC | PRN

## 2023-11-27 MED ORDER — DICLOFENAC SODIUM 1 % EX GEL: 2.0000 g | Freq: Four times a day (QID) | CUTANEOUS | 0 refills | Status: DC

## 2023-11-27 MED ORDER — PREDNISONE 20 MG PO TABS: 40.0000 mg | ORAL_TABLET | Freq: Every day | ORAL | 0 refills | Status: AC

## 2023-11-27 NOTE — ED Provider Notes (Signed)
 [facility]    CSN: [id number] Arrival date & time: 11/27/23  1147      History   Chief Complaint Chief Complaint  Patient presents with   Fall   Cough    HPI Anna Mcguire is a 68 y.o. female.   Patient presents to clinic over concerns of pain after a fall approximately a week ago.  She was getting up in the middle the night to go to the bathroom when she tripped over something.  She is having pain in her left hand, initially had bruising and swelling.  Bruising remains.  Having pain in her right shoulder, endorses limitation to range of motion the area is quite tender.  Left knee pain, landed on this when she fell and has previous left total knee replacement.  Left cheek pain.  Denies ETOH use contributing to her fall.   Has also felt short of breath like she has been wheezing with increased sputum production.  Does have a history of COPD.  She does smoke a few cigarettes daily still.  Normally uses a cane to ambulate.  Has been ambulating since her fall.  Has been taking Tylenol  for pain management.  Has been using rubbing alcohol and ice to the areas.  The history is provided by the patient and medical records.  Fall  Cough   Past Medical History:  Diagnosis Date   Acid reflux    Anxiety    Arthritis    COPD (chronic obstructive pulmonary disease) (HCC)    Depression    Dyspnea    Hepatitis    Hypertension     Patient Active Problem List   Diagnosis Date Noted   Moderately severe major depression (HCC) 08/20/2023   Primary osteoarthritis of left knee 06/24/2023   Normocytic anemia 04/08/2023   Loss of weight 04/08/2023   Vomiting without nausea 04/08/2023   Altered bowel habits 04/08/2023   Primary hyperparathyroidism (HCC) 03/26/2023   Arthritis of carpometacarpal (CMC) joint of left thumb 04/23/2022   Pain in left shoulder 04/23/2022   Hypercalcemia 12/31/2021   Allergic rhinitis 10/24/2020   Rotator cuff arthropathy of left shoulder  08/07/2020   Cervicalgia 08/07/2020   Chronic cough 03/21/2020   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   Tobacco dependence 01/30/2020   Diarrhea of infectious origin 01/30/2020   Hyponatremia 01/13/2020   Alcohol abuse 01/13/2020   Tobacco abuse 01/13/2020   Chronic diarrhea 01/13/2020   Hypophosphatemia 01/13/2020   Hypertension    Chronic obstructive pulmonary disease (HCC)    Alcohol use    Hypokalemia 01/12/2020   Tobacco use 10/06/2019   Unilateral primary osteoarthritis, left knee 09/08/2019   Pain in right shoulder 08/11/2019    Past Surgical History:  Procedure Laterality Date   COLONOSCOPY WITH PROPOFOL  N/A 06/07/2014   Procedure: COLONOSCOPY WITH PROPOFOL ;  Surgeon: Evangeline Hilts, MD;  Location: [facility];  Service: Endoscopy;  Laterality: N/A;   ESOPHAGOGASTRODUODENOSCOPY (EGD) WITH PROPOFOL  N/A 06/07/2014   Procedure: ESOPHAGOGASTRODUODENOSCOPY (EGD) WITH PROPOFOL ;  Surgeon: Evangeline Hilts, MD;  Location: [facility];  Service: Endoscopy;  Laterality: N/A;   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   Procedure: TOTAL KNEE ARTHROPLASTY;  Surgeon: Murleen Arms, MD;  Location: [facility];  Service: Orthopedics;  Laterality: Left;    OB History   No obstetric history on file.      Home Medications    Prior to Admission medications   Medication Sig Start Date End Date Taking? Authorizing Provider  albuterol  (PROVENTIL ) (2.5 MG/3ML) 0.083% nebulizer solution inhale THE contents of 1 vial PER nebulizer EVERY 6 HOURS AS NEEDED FOR wheezing OR SHORTNESS OF BREATH 12/31/21  Yes Lawrance Presume, MD  albuterol  (PROVENTIL ) (2.5 MG/3ML) 0.083% nebulizer solution Take 3 mLs (2.5 mg total) by nebulization every 6 (six) hours as needed for wheezing or shortness of breath. 11/27/23  Yes Harlow Lighter, Bert Ptacek  N, FNP  albuterol  (VENTOLIN  HFA) 108 (90 Base) MCG/ACT inhaler Inhale 2 puffs into the lungs every 6 (six) hours as needed for wheezing or  shortness of breath. 08/20/23  Yes Lawrance Presume, MD  amLODipine  (NORVASC ) 10 MG tablet TAKE 1 Tablet BY MOUTH ONCE DAILY FOR BLOOD PRESSURE 10/07/23  Yes Lawrance Presume, MD  amoxicillin-clavulanate (AUGMENTIN) 875-125 MG tablet Take 1 tablet by mouth every 12 (twelve) hours. 11/27/23  Yes Maribel Luis  N, FNP  atorvastatin  (LIPITOR ) 80 MG tablet Take 1 tablet (80 mg total) by mouth daily. 08/20/23  Yes Lawrance Presume, MD  diclofenac  Sodium (VOLTAREN  ARTHRITIS PAIN) 1 % GEL Apply 2 g topically 4 (four) times daily. 11/27/23  Yes Trevelle Mcgurn  N, FNP  hydrALAZINE  (APRESOLINE ) 10 MG tablet Take 2 tablets (20 mg total) by mouth in the morning and at bedtime. Stop Avapro  (irbesartan ) 08/26/23  Yes Lawrance Presume, MD  mometasone -formoterol  (DULERA ) 100-5 MCG/ACT AERO Inhale 2 puffs into the lungs 2 (two) times daily. 08/20/23  Yes Lawrance Presume, MD  pantoprazole  (PROTONIX ) 40 MG tablet TAKE 1 Tablet BY MOUTH TWICE DAILY 09/22/23  Yes Lawrance Presume, MD  predniSONE  (DELTASONE ) 20 MG tablet Take 2 tablets (40 mg total) by mouth daily for 5 days. 11/27/23 12/02/23 Yes Kersten Salmons  N, FNP  sertraline  (ZOLOFT ) 50 MG tablet Take 1 tablet (50 mg total) by mouth daily. 08/20/23  Yes Lawrance Presume, MD  tiotropium (SPIRIVA  HANDIHALER) 18 MCG inhalation capsule PLACE 1 CAPSULE INTO INHALER AND INHALE DAILY 08/20/23  Yes Lawrance Presume, MD  Vitamin D , Cholecalciferol , 10 MCG (400 UNIT) CAPS Take 400 Int'l Units by mouth daily. 03/26/23  Yes Lawrance Presume, MD  ferrous sulfate  325 (65 FE) MG tablet Take 1 tablet (325 mg total) by mouth daily with breakfast. Patient to pay out of pocket if not covered by her insurance. Patient not taking: Reported on 08/20/2023 05/14/23   Lawrance Presume, MD  polyethylene glycol powder (MIRALAX ) 17 GM/SCOOP powder Take 17 g by mouth daily. Mix with liquid as directed. Patient not taking: Reported on 08/20/2023 06/25/23   Murleen Arms, MD   sodium zirconium cyclosilicate  (LOKELMA ) 5 g packet Mix 1 packet with 3 tablespoonful of water and take PO. STOP AVAPRO  (irbesartan ) 08/28/23   Lawrance Presume, MD  tiZANidine  (ZANAFLEX ) 4 MG tablet Take 1 tablet (4 mg total) by mouth every 8 (eight) hours as needed for muscle spasms. 10/21/23   Afton Albright, MD  fluticasone (FLONASE) 50 MCG/ACT nasal spray Place 1 spray into both nostrils daily.  05/12/19  [provider]  lovastatin (MEVACOR) 20 MG tablet Take 20 mg by mouth daily at 6 PM.  05/12/19  [provider]    Family History Family History  Problem Relation Age of Onset   Kidney failure Mother    Aneurysm Father     Social History Social History   Tobacco Use   Smoking status: Every Day    Current packs/day: 0.25    Types: Cigarettes   Smokeless tobacco: Never   Tobacco comments:  3 times a week - 10/24/2020    Buproprion helps  Vaping Use   Vaping status: Never Used  Substance Use Topics   Alcohol use: Yes    Comment: occasionally   Drug use: Yes    Types: Marijuana     Allergies   Chantix [varenicline tartrate] and Penicillins   Review of Systems Review of Systems  Per HPI  Physical Exam Triage Vital Signs ED Triage Vitals  Encounter Vitals Group     BP 11/27/23 1239 (!) 169/74     Systolic BP Percentile --      Diastolic BP Percentile --      Pulse Rate 11/27/23 1239 76     Resp 11/27/23 1239 16     Temp 11/27/23 1239 98.1 F (36.7 C)     Temp Source 11/27/23 1239 Oral     SpO2 11/27/23 1239 97 %     Weight --      Height --      Head Circumference --      Peak Flow --      Pain Score 11/27/23 1241 8     Pain Loc --      Pain Education --      Exclude from Growth Chart --    No data found.  Updated Vital Signs BP (!) 169/74   Pulse 76   Temp 98.1 F (36.7 C) (Oral)   Resp 16   SpO2 97%   Visual Acuity Right Eye Distance:   Left Eye Distance:   Bilateral Distance:    Right Eye Near:   Left Eye Near:     Bilateral Near:     Physical Exam Vitals and nursing note reviewed.  Constitutional:      Appearance: Normal appearance.  HENT:     Head: Normocephalic and atraumatic.      Right Ear: External ear normal.     Left Ear: External ear normal.     Nose: Nose normal.     Mouth/Throat:     Mouth: Mucous membranes are moist.  Eyes:     Conjunctiva/sclera: Conjunctivae normal.  Cardiovascular:     Rate and Rhythm: Normal rate and regular rhythm.     Pulses: Normal pulses.     Heart sounds: No murmur heard. Pulmonary:     Effort: Pulmonary effort is normal.     Breath sounds: Decreased air movement present.  Musculoskeletal:        General: Swelling, tenderness and signs of injury present.       Arms:       Legs:  Skin:    General: Skin is warm and dry.  Neurological:     General: No focal deficit present.     Mental Status: She is alert.  Psychiatric:        Mood and Affect: Mood normal.        Behavior: Behavior is cooperative.      UC Treatments / Results  Labs (all labs ordered are listed, but only abnormal results are displayed) Labs Reviewed - No data to display  EKG   Radiology DG Shoulder Right Result Date: 11/27/2023 CLINICAL DATA:  Pain after fall onto outstretched hand. EXAM: RIGHT SHOULDER - 2+ VIEW COMPARISON:  Shoulder radiograph 10/21/2023 FINDINGS: There is no evidence of fracture or dislocation. Similar spurring at the acromioclavicular joint. Similar mild glenohumeral joint space narrowing. Soft tissues are unremarkable. IMPRESSION: 1. No fracture or dislocation of the right shoulder. 2. Stable chronic change. Electronically  Signed   By: Chadwick Colonel M.D.   On: 11/27/2023 14:06    Procedures Procedures (including critical care time)  Medications Ordered in UC Medications - No data to display  Initial Impression / Assessment and Plan / UC Course  I have reviewed the triage vital signs and the nursing notes.  Pertinent labs & imaging results  that were available during my care of the patient were reviewed by me and considered in my medical decision making (see chart for details).  Vitals in triage reviewed, patient is hemodynamically stable.  Lungs are overall diminished, heart with regular rate and rhythm.  Chest x-ray does not show obvious infiltrate.  Increased sputum production, shortness of breath and wheezing, will treat for COPD exacerbation with Augmentin, which patient has tolerated in the past.  Steroid burst as well.  Will provide with nebulizer and equipment.  Imaging by my interpretation does not show any acute bony abnormalities.  Advised topical Voltaren  gel and Tylenol  to sites of pain.  Plan of care, follow-up care return precautions given, no questions at this time.     Final Clinical Impressions(s) / UC Diagnoses   Final diagnoses:  COPD with exacerbation (HCC)  Fall, initial encounter  Left hand pain  Acute pain of right shoulder  Acute pain of left knee     Discharge Instructions      You can use the albuterol  every 6 hours for wheezing or shortness of breath.  Start the steroids today and then daily with breakfast.  Take the antibiotics as prescribed and take them with food to help prevent stomach upset or diarrhea.  For aches and pains after your fall you can apply topical Voltaren  gel.  Tylenol  500 mg every 8 hours may help as well.  Follow-up with your primary care provider for any persistent symptoms.  Return to clinic for any new or urgent symptoms.    ED Prescriptions     Medication Sig Dispense Auth. Provider   amoxicillin-clavulanate (AUGMENTIN) 875-125 MG tablet Take 1 tablet by mouth every 12 (twelve) hours. 14 tablet Harlow Lighter, Rhiannan Kievit  N, FNP   diclofenac  Sodium (VOLTAREN  ARTHRITIS PAIN) 1 % GEL Apply 2 g topically 4 (four) times daily. 50 g Harlow Lighter, Monique Gift  N, FNP   predniSONE  (DELTASONE ) 20 MG tablet Take 2 tablets (40 mg total) by mouth daily for 5 days. 10 tablet Harlow Lighter, Anndrea Mihelich   N, FNP   albuterol  (PROVENTIL ) (2.5 MG/3ML) 0.083% nebulizer solution Take 3 mLs (2.5 mg total) by nebulization every 6 (six) hours as needed for wheezing or shortness of breath. 75 mL Harlow Lighter, Ferrin Liebig  N, FNP      PDMP not reviewed this encounter.   Harlow Lighter, Niara Bunker  N, FNP 11/27/23 1409

## 2023-11-27 NOTE — ED Notes (Signed)
 Home neb provided to pt.

## 2023-11-27 NOTE — Telephone Encounter (Signed)
 Chief Complaint: pain all over Symptoms: left knee pain and left side of face and right should Frequency: x 1 week Pertinent Negatives: Patient denies sob Disposition: [] ED /[x] Urgent Care (no appt availability in office) / [] Appointment(In office/virtual)/ []  [facility]/ [] Home Care/ [] Refused Recommended Disposition /[] [facility] [place] [facility]/ []  Follow-up with PCP  Additional Notes: pt states that she fell last week and was seen in UC. States that she is still  having some pain in her left knee and side and left side of face and right shoulder.  Pt is requesting some Tramadol  for her pain. States that she had a appt today but was unable to come due to transportation. Pt  states she is needing to rescheduled that appt. Pt states that she is no longer able to sit and tolerate the pain.   Copied from CRM [id number]. Topic: Clinical - Red Word Triage >> Nov 27, 2023  9:41 AM [place] B wrote: [facility] that prompted transfer to Nurse Triage: pt had a fall last Thursday ,went to urgent care last week and still has severe pain on right knee, shoulder ,face and left side Reason for Disposition . [1] MODERATE pain (e.g., interferes with normal activities) AND [2] present > 3 days  Protocols used: Muscle Aches and Body Pain-A-AH

## 2023-11-27 NOTE — ED Triage Notes (Addendum)
 Pt reports tripping and falling 1 wk ago onto the floor. C/O right shoulder, left hand, left knee, left facial pain since incident. Reports normal ROM of all joints, but continues with pain. Has tried Tyl and rubbing alcohol and ice. Pt normally ambulates with cane. Pt also c/o cough and nasal congestion x 1 wk. Wishes to have her breathing checked "to make sure I'm OK since I have COPD".

## 2023-11-27 NOTE — Discharge Instructions (Signed)
 You can use the albuterol  every 6 hours for wheezing or shortness of breath.  Start the steroids today and then daily with breakfast.  Take the antibiotics as prescribed and take them with food to help prevent stomach upset or diarrhea.  For aches and pains after your fall you can apply topical Voltaren  gel.  Tylenol  500 mg every 8 hours may help as well.  Follow-up with your primary care provider for any persistent symptoms.  Return to clinic for any new or urgent symptoms.

## 2023-11-27 NOTE — Telephone Encounter (Signed)
 was seen earlier, stated the pharmacy said she can not take the medication that was prescribed. [phone]   Pharmacy called and made sure it is ok to take Augmentin since pt has a documented allergy to amoxil. Per Provider she and pt spoke about this and pt states she had diarrhea and is ok to take Augmentin since she has taken it without issue in the past.   Called lmom for pt and she can call back if she has questions.

## 2023-12-01 NOTE — Telephone Encounter (Signed)
 Left message on voicemail to return call.  Attempt to schedule appt for VV with [facility] at 4:40.

## 2023-12-15 ENCOUNTER — Ambulatory Visit: Payer: 59 | Attending: Internal Medicine

## 2023-12-15 VITALS — Ht 66.0 in | Wt 110.0 lb

## 2023-12-15 DIAGNOSIS — Z0000 Encounter for general adult medical examination without abnormal findings: Secondary | ICD-10-CM | POA: Diagnosis not present

## 2023-12-15 NOTE — Patient Instructions (Signed)
 Anna Mcguire , Thank you for taking time out of your busy schedule to complete your Annual Wellness Visit with me. I enjoyed our conversation and look forward to speaking with you again next year. I, as well as your care team,  appreciate your ongoing commitment to your health goals. Please review the following plan we discussed and let me know if I can assist you in the future. Your Game plan/ To Do List    Referrals: If you haven't heard from the office you've been referred to, please reach out to them at the phone provided.   Follow up Visits: Next Medicare AWV with our clinical staff: 12/20/2024 at 2:10 pm phone visit with Nurse   Have you seen your provider in the last 6 months (3 months if uncontrolled diabetes)? Yes Next Office Visit with your provider: 01/07/2024 at 10:10 am office visit with Dr. Lincoln Renshaw  Clinician Recommendations:  Aim for 30 minutes of exercise or brisk walking, 6-8 glasses of water, and 5 servings of fruits and vegetables each day.       This is a list of the screening recommended for you and due dates:  Health Maintenance  Topic Date Due   DEXA scan (bone density measurement)  Never done   Mammogram  03/21/2021   COVID-19 Vaccine (3 - 2024-25 season) 03/08/2023   Flu Shot  02/05/2024   Medicare Annual Wellness Visit  12/14/2024   DTaP/Tdap/Td vaccine (2 - Td or Tdap) 06/26/2031   Colon Cancer Screening  05/03/2033   Pneumonia Vaccine  Completed   Hepatitis C Screening  Completed   Zoster (Shingles) Vaccine  Completed   HPV Vaccine  Aged Out   Meningitis B Vaccine  Aged Out    Advanced directives: (Declined) Advance directive discussed with you today. Even though you declined this today, please call our office should you change your mind, and we can give you the proper paperwork for you to fill out. Advance Care Planning is important because it:  [x]  Makes sure you receive the medical care that is consistent with your values, goals, and preferences  [x]  It  provides guidance to your family and loved ones and reduces their decisional burden about whether or not they are making the right decisions based on your wishes.  Follow the link provided in your after visit summary or read over the paperwork we have mailed to you to help you started getting your Advance Directives in place. If you need assistance in completing these, please reach out to us  so that we can help you!  See attachments for Preventive Care and Fall Prevention Tips.

## 2023-12-15 NOTE — Progress Notes (Signed)
 Because this visit was a virtual/telehealth visit,  certain criteria was not obtained, such a blood pressure, CBG if applicable, and timed get up and go. Any medications not marked as "taking" were not mentioned during the medication reconciliation part of the visit. Any vitals not documented were not able to be obtained due to this being a telehealth visit or patient was unable to self-report a recent blood pressure reading due to a lack of equipment at home via telehealth. Vitals that have been documented are verbally provided by the patient.   Subjective:   Anna Mcguire is a 68 y.o. who presents for a Medicare Wellness preventive visit.  As a reminder, Annual Wellness Visits don't include a physical exam, and some assessments may be limited, especially if this visit is performed virtually. We may recommend an in-person follow-up visit with your provider if needed.  Visit Complete: Virtual I connected with  Barbar Levine on 12/15/23 by a audio enabled telemedicine application and verified that I am speaking with the correct person using two identifiers.  Patient Location: Home  Provider Location: Office/Clinic  I discussed the limitations of evaluation and management by telemedicine. The patient expressed understanding and agreed to proceed.  Vital Signs: Because this visit was a virtual/telehealth visit, some criteria may be missing or patient reported. Any vitals not documented were not able to be obtained and vitals that have been documented are patient reported.  VideoDeclined- This patient declined Librarian, academic. Therefore the visit was completed with audio only.  Persons Participating in Visit: Patient.  AWV Questionnaire: No: Patient Medicare AWV questionnaire was not completed prior to this visit.  Cardiac Risk Factors include: advanced age (>45men, >70 women);family history of premature cardiovascular  disease;hypertension;sedentary lifestyle;smoking/ tobacco exposure     Objective:     Today's Vitals   12/15/23 1412  Weight: 110 lb (49.9 kg)  Height: 5\' 6"  (1.676 m)  PainSc: 0-No pain   Body mass index is 17.75 kg/m.     12/15/2023    2:14 PM 06/29/2023    1:12 PM 06/24/2023    9:32 AM 06/24/2023    9:24 AM 06/23/2023   11:52 AM 10/24/2022    2:02 PM 09/04/2022   10:54 AM  Advanced Directives  Does Patient Have a Medical Advance Directive? No No No No No No No  Would patient like information on creating a medical advance directive? No - Patient declined No - Patient declined  No - Patient declined No - Patient declined No - Patient declined No - Patient declined    Current Medications (verified) Outpatient Encounter Medications as of 12/15/2023  Medication Sig   albuterol  (PROVENTIL ) (2.5 MG/3ML) 0.083% nebulizer solution inhale THE contents of 1 vial PER nebulizer EVERY 6 HOURS AS NEEDED FOR wheezing OR SHORTNESS OF BREATH   albuterol  (PROVENTIL ) (2.5 MG/3ML) 0.083% nebulizer solution Take 3 mLs (2.5 mg total) by nebulization every 6 (six) hours as needed for wheezing or shortness of breath.   albuterol  (VENTOLIN  HFA) 108 (90 Base) MCG/ACT inhaler Inhale 2 puffs into the lungs every 6 (six) hours as needed for wheezing or shortness of breath.   amLODipine  (NORVASC ) 10 MG tablet TAKE 1 Tablet BY MOUTH ONCE DAILY FOR BLOOD PRESSURE   amoxicillin -clavulanate (AUGMENTIN ) 875-125 MG tablet Take 1 tablet by mouth every 12 (twelve) hours.   atorvastatin  (LIPITOR ) 80 MG tablet Take 1 tablet (80 mg total) by mouth daily.   diclofenac  Sodium (VOLTAREN  ARTHRITIS PAIN) 1 % GEL  Apply 2 g topically 4 (four) times daily.   ferrous sulfate  325 (65 FE) MG tablet Take 1 tablet (325 mg total) by mouth daily with breakfast. Patient to pay out of pocket if not covered by her insurance. (Patient not taking: Reported on 08/20/2023)   hydrALAZINE  (APRESOLINE ) 10 MG tablet Take 2 tablets (20 mg total)  by mouth in the morning and at bedtime. Stop Avapro  (irbesartan )   mometasone -formoterol  (DULERA ) 100-5 MCG/ACT AERO Inhale 2 puffs into the lungs 2 (two) times daily.   pantoprazole  (PROTONIX ) 40 MG tablet TAKE 1 Tablet BY MOUTH TWICE DAILY   polyethylene glycol powder (MIRALAX ) 17 GM/SCOOP powder Take 17 g by mouth daily. Mix with liquid as directed. (Patient not taking: Reported on 08/20/2023)   sertraline  (ZOLOFT ) 50 MG tablet Take 1 tablet (50 mg total) by mouth daily.   sodium zirconium cyclosilicate  (LOKELMA ) 5 g packet Mix 1 packet with 3 tablespoonful of water and take PO. STOP AVAPRO  (irbesartan )   tiotropium (SPIRIVA  HANDIHALER) 18 MCG inhalation capsule PLACE 1 CAPSULE INTO INHALER AND INHALE DAILY   tiZANidine  (ZANAFLEX ) 4 MG tablet Take 1 tablet (4 mg total) by mouth every 8 (eight) hours as needed for muscle spasms.   Vitamin D , Cholecalciferol , 10 MCG (400 UNIT) CAPS Take 400 Int'l Units by mouth daily.   [DISCONTINUED] fluticasone (FLONASE) 50 MCG/ACT nasal spray Place 1 spray into both nostrils daily.   [DISCONTINUED] lovastatin (MEVACOR) 20 MG tablet Take 20 mg by mouth daily at 6 PM.   No facility-administered encounter medications on file as of 12/15/2023.    Allergies (verified) Chantix [varenicline tartrate] and Penicillins   History: Past Medical History:  Diagnosis Date   Acid reflux    Anxiety    Arthritis    COPD (chronic obstructive pulmonary disease) (HCC)    Depression    Dyspnea    Hepatitis    Hypertension    Past Surgical History:  Procedure Laterality Date   COLONOSCOPY WITH PROPOFOL  N/A 06/07/2014   Procedure: COLONOSCOPY WITH PROPOFOL ;  Surgeon: Evangeline Hilts, MD;  Location: [facility];  Service: Endoscopy;  Laterality: N/A;   ESOPHAGOGASTRODUODENOSCOPY (EGD) WITH PROPOFOL  N/A 06/07/2014   Procedure: ESOPHAGOGASTRODUODENOSCOPY (EGD) WITH PROPOFOL ;  Surgeon: Evangeline Hilts, MD;  Location: [facility];  Service: Endoscopy;  Laterality: N/A;    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   Procedure: TOTAL KNEE ARTHROPLASTY;  Surgeon: Murleen Arms, MD;  Location: [facility];  Service: Orthopedics;  Laterality: Left;   Family History  Problem Relation Age of Onset   Kidney failure Mother    Aneurysm Father    Social History   Socioeconomic History   Marital status: Single    Spouse name: Not on file   Number of children: Not on file   Years of education: Not on file   Highest education level: Not on file  Occupational History   Not on file  Tobacco Use   Smoking status: Every Day    Current packs/day: 0.25    Types: Cigarettes   Smokeless tobacco: Never   Tobacco comments:    3 times a week - 10/24/2020    Buproprion helps  Vaping Use   Vaping status: Never Used  Substance and Sexual Activity   Alcohol use: Yes    Comment: occasionally   Drug use: Yes    Types: Marijuana   Sexual activity: Not on file  Other Topics Concern   Not on file  Social History Narrative   Retired   Chief Executive Officer  Drivers of Corporate investment banker Strain: Low Risk  (12/15/2023)   Overall Financial Resource Strain (CARDIA)    Difficulty of Paying Living Expenses: Not hard at all  Food Insecurity: No Food Insecurity (12/15/2023)   Hunger Vital Sign    Worried About Running Out of Food in the Last Year: Never true    Ran Out of Food in the Last Year: Never true  Transportation Needs: No Transportation Needs (12/15/2023)   PRAPARE - Administrator, Civil Service (Medical): No    Lack of Transportation (Non-Medical): No  Physical Activity: Inactive (12/15/2023)   Exercise Vital Sign    Days of Exercise per Week: 0 days    Minutes of Exercise per Session: 0 min  Stress: No Stress Concern Present (12/15/2023)   [employer] of Occupational Health - Occupational Stress Questionnaire    Feeling of Stress : Not at all  Social Connections: Moderately Integrated (12/15/2023)   Social Connection and Isolation Panel [NHANES]     Frequency of Communication with Friends and Family: More than three times a week    Frequency of Social Gatherings with Friends and Family: More than three times a week    Attends Religious Services: More than 4 times per year    Active Member of [employer] or Organizations: Yes    Attends Engineer, structural: More than 4 times per year    Marital Status: Never married    Tobacco Counseling Ready to quit: Not Answered Counseling given: Not Answered Tobacco comments: 3 times a week - 10/24/2020 Buproprion helps    Clinical Intake:  Pre-visit preparation completed: Yes  Pain : No/denies pain Pain Score: 0-No pain     BMI - recorded: 17.75 Nutritional Status: BMI <19  Underweight Nutritional Risks: None Diabetes: No  Lab Results  Component Value Date   HGBA1C 5.2 07/29/2019     How often do you need to have someone help you when you read instructions, pamphlets, or other written materials from your doctor or pharmacy?: 1 - Never  Interpreter Needed?: No  Information entered by :: Aren Pryde N. Terie Lear, LPN.   Activities of Daily Living     12/15/2023    2:16 PM 06/24/2023    5:50 PM  In your present state of health, do you have any difficulty performing the following activities:  Hearing? 0 0  Vision? 0 0  Difficulty concentrating or making decisions? 1 0  Walking or climbing stairs? 1   Dressing or bathing? 1   Doing errands, shopping? 1 0  Preparing Food and eating ? Y   Using the Toilet? Y   In the past six months, have you accidently leaked urine? Y   Do you have problems with loss of bowel control? Y   Managing your Medications? Y   Managing your Finances? Y   Housekeeping or managing your Housekeeping? Y     Patient Care Team: Lawrance Presume, MD as PCP - General (Internal Medicine)  I have updated your Care Teams any recent Medical Services you may have received from other providers in the past year.     Assessment:    This is a routine  wellness examination for Shadoe.  Hearing/Vision screen Hearing Screening - Comments:: Denies hearing difficulties.  Vision Screening - Comments:: Wears rx glasses - not up to date with routine eye exams.    Goals Addressed             This Visit's Progress  Client understands the importance of follow-up with providers by attending scheduled visits         Depression Screen     12/15/2023    2:17 PM 08/20/2023    9:35 AM 05/14/2023   12:02 PM 03/13/2023    9:28 AM 12/05/2022   12:18 PM 09/04/2022   10:57 AM 08/05/2022    8:44 AM  PHQ 2/9 Scores  PHQ - 2 Score 4 4 4 4 3 2 4   PHQ- 9 Score 5 16 14 14 11 6 12     Fall Risk     12/15/2023    2:14 PM 05/14/2023   12:02 PM 03/25/2023    2:44 PM 03/13/2023    9:16 AM 12/05/2022   11:37 AM  Fall Risk   Falls in the past year? 1 1 1 1 1   Number falls in past yr: 1 0 1 1 1   Injury with Fall? 1 0 1 0 0  Risk for fall due to : History of fall(s);Impaired balance/gait;Orthopedic patient History of fall(s) History of fall(s) History of fall(s) History of fall(s)  Follow up Falls evaluation completed;Education provided Falls evaluation completed  Falls evaluation completed Falls evaluation completed    MEDICARE RISK AT HOME:  Medicare Risk at Home Any stairs in or around the home?: No If so, are there any without handrails?: No Home free of loose throw rugs in walkways, pet beds, electrical cords, etc?: Yes Adequate lighting in your home to reduce risk of falls?: Yes Life alert?: No Use of a cane, walker or w/c?: Yes Grab bars in the bathroom?: Yes Shower chair or bench in shower?: Yes Elevated toilet seat or a handicapped toilet?: Yes  TIMED UP AND GO:  Was the test performed?  No  Cognitive Function: Declined/Normal: No cognitive concerns noted by patient or family. Patient alert, oriented, able to answer questions appropriately and recall recent events. No signs of memory loss or confusion.    12/15/2023    2:40 PM 09/04/2022    11:03 AM  MMSE - Mini Mental State Exam  Not completed: Unable to complete   Orientation to time  5  Orientation to Place  5  Registration  3  Attention/ Calculation  5  Recall  2  Language- name 2 objects  2  Language- repeat  1  Language- follow 3 step command  3  Language- read & follow direction  1  Write a sentence  1  Copy design  1  Total score  29        12/15/2023    2:42 PM  6CIT Screen  What Year? 0 points  What month? 0 points  What time? 0 points  Count back from 20 0 points  Months in reverse 0 points  Repeat phrase 0 points  Total Score 0 points    Immunizations Immunization History  Administered Date(s) Administered   Fluad Trivalent(High Dose 65+) 03/13/2023   Influenza,inj,Quad PF,6+ Mos 04/13/2020, 03/20/2021, 04/03/2022   Moderna Sars-Covid-2 Vaccination 11/04/2019, 12/02/2019   PNEUMOCOCCAL CONJUGATE-20 03/20/2021   Tdap 06/25/2021   Zoster Recombinant(Shingrix) 08/22/2022, 10/13/2022    Screening Tests Health Maintenance  Topic Date Due   DEXA SCAN  Never done   MAMMOGRAM  03/21/2021   COVID-19 Vaccine (3 - 2024-25 season) 03/08/2023   INFLUENZA VACCINE  02/05/2024   Medicare Annual Wellness (AWV)  12/14/2024   DTaP/Tdap/Td (2 - Td or Tdap) 06/26/2031   Colonoscopy  05/03/2033   Pneumonia Vaccine 37+ Years old  Completed   Hepatitis C Screening  Completed   Zoster Vaccines- Shingrix  Completed   HPV VACCINES  Aged Out   Meningococcal B Vaccine  Aged Out    Health Maintenance  Health Maintenance Due  Topic Date Due   DEXA SCAN  Never done   MAMMOGRAM  03/21/2021   COVID-19 Vaccine (3 - 2024-25 season) 03/08/2023   Health Maintenance Items Addressed: Yes   Additional Screening:  Vision Screening: Recommended annual ophthalmology exams for early detection of glaucoma and other disorders of the eye. Would you like a referral to an eye doctor? No    Dental Screening: Recommended annual dental exams for proper oral  hygiene  Community Resource Referral / Chronic Care Management: CRR required this visit?  No   CCM required this visit?  No   Plan:    I have personally reviewed and noted the following in the patient's chart:   Medical and social history Use of alcohol, tobacco or illicit drugs  Current medications and supplements including opioid prescriptions. Patient is not currently taking opioid prescriptions. Functional ability and status Nutritional status Physical activity Advanced directives List of other physicians Hospitalizations, surgeries, and ER visits in previous 12 months Vitals Screenings to include cognitive, depression, and falls Referrals and appointments  In addition, I have reviewed and discussed with patient certain preventive protocols, quality metrics, and best practice recommendations. A written personalized care plan for preventive services as well as general preventive health recommendations were provided to patient.   Margette Sheldon, LPN   0/45/[id number]   After Visit Summary: (Declined) Due to this being a telephonic visit, with patients personalized plan was offered to patient but patient Declined AVS at this time   Notes: Please refer to Routing Comments.

## 2023-12-16 ENCOUNTER — Telehealth: Payer: Self-pay | Admitting: *Deleted

## 2023-12-16 NOTE — Progress Notes (Signed)
 Complex Care Management Note Care Guide Note  12/16/2023 Name: Anna Mcguire MRN: [id number] DOB: Jun 13, 1956   Complex Care Management Outreach Attempts: An unsuccessful telephone outreach was attempted today to offer the patient information about available complex care management services.  Follow Up Plan:  Additional outreach attempts will be made to offer the patient complex care management information and services.   Encounter Outcome:  No Answer  Barnie Bora  [facility]  [facility], [facility] Guide  Direct Dial: [phone]  Fax [phone]

## 2023-12-17 ENCOUNTER — Telehealth: Payer: Self-pay
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
## 2023-12-17 NOTE — Progress Notes (Signed)
 Complex Care Management Note Care Guide Note  12/17/2023 Name: Osa Fogarty MRN: [id number] DOB: [birth year]   Complex Care Management Outreach Attempts: A second unsuccessful outreach was attempted today to offer the patient with information about available complex care management services.  Follow Up Plan:  Additional outreach attempts will be made to offer the patient complex care management information and services.   Encounter Outcome:  No Answer  Barnie Bora  [facility]  [facility], [facility] Guide  Direct Dial: [phone]  Fax [phone]

## 2023-12-18 NOTE — Progress Notes (Signed)
 Complex Care Management Note Care Guide Note  12/18/2023 Name: Anna Mcguire MRN: [id number] DOB: [birth year]   Complex Care Management Outreach Attempts: A third unsuccessful outreach was attempted today to offer the patient with information about available complex care management services.  Follow Up Plan:  No further outreach attempts will be made at this time. We have been unable to contact the patient to offer or enroll patient in complex care management services.  Encounter Outcome:  No Answer  Barnie Bora  [facility]  [facility], [facility] Guide  Direct Dial: [phone]  Fax [phone]

## 2023-12-23 NOTE — Telephone Encounter (Signed)
 I called the patient to inform her of the appointment that has been scheduled with [facility]: 04/19/2024 @ 0930. And I had to leave a message requesting a call back.

## 2023-12-28 DIAGNOSIS — J449 Chronic obstructive pulmonary disease, unspecified: Secondary | ICD-10-CM | POA: Diagnosis not present

## 2023-12-28 DIAGNOSIS — J441 Chronic obstructive pulmonary disease with (acute) exacerbation: Secondary | ICD-10-CM | POA: Diagnosis not present

## 2023-12-30 ENCOUNTER — Encounter: Payer: Self-pay | Admitting: Neurology

## 2023-12-30 ENCOUNTER — Telehealth: Payer: Self-pay | Admitting: Neurology

## 2023-12-30 NOTE — Telephone Encounter (Signed)
 LVM and sent letter in mail informing pt of need to reschedule 04/19/24 appt - MD out

## 2024-01-07 ENCOUNTER — Ambulatory Visit: Attending: Internal Medicine | Admitting: Internal Medicine

## 2024-01-07 ENCOUNTER — Telehealth: Payer: Self-pay

## 2024-01-07 ENCOUNTER — Other Ambulatory Visit: Payer: Self-pay | Admitting: Internal Medicine

## 2024-01-07 VITALS — BP 153/66 | HR 72 | Ht 66.0 in | Wt 108.0 lb

## 2024-01-07 DIAGNOSIS — R1110 Vomiting, unspecified: Secondary | ICD-10-CM

## 2024-01-07 DIAGNOSIS — R634 Abnormal weight loss: Secondary | ICD-10-CM

## 2024-01-07 DIAGNOSIS — R202 Paresthesia of skin: Secondary | ICD-10-CM | POA: Diagnosis not present

## 2024-01-07 DIAGNOSIS — E213 Hyperparathyroidism, unspecified: Secondary | ICD-10-CM | POA: Diagnosis not present

## 2024-01-07 DIAGNOSIS — Z6379 Other stressful life events affecting family and household: Secondary | ICD-10-CM | POA: Diagnosis not present

## 2024-01-07 DIAGNOSIS — N1831 Chronic kidney disease, stage 3a: Secondary | ICD-10-CM | POA: Diagnosis not present

## 2024-01-07 DIAGNOSIS — E46 Unspecified protein-calorie malnutrition: Secondary | ICD-10-CM

## 2024-01-07 DIAGNOSIS — F1010 Alcohol abuse, uncomplicated: Secondary | ICD-10-CM

## 2024-01-07 DIAGNOSIS — D649 Anemia, unspecified: Secondary | ICD-10-CM | POA: Diagnosis not present

## 2024-01-07 DIAGNOSIS — R200 Anesthesia of skin: Secondary | ICD-10-CM | POA: Diagnosis not present

## 2024-01-07 DIAGNOSIS — I10 Essential (primary) hypertension: Secondary | ICD-10-CM | POA: Diagnosis not present

## 2024-01-07 MED ORDER — ALBUTEROL SULFATE (2.5 MG/3ML) 0.083% IN NEBU: 2.5000 mg | INHALATION_SOLUTION | Freq: Four times a day (QID) | RESPIRATORY_TRACT | 0 refills | Status: DC | PRN

## 2024-01-07 MED ORDER — ATORVASTATIN CALCIUM 80 MG PO TABS: 80.0000 mg | ORAL_TABLET | Freq: Every day | ORAL | 1 refills | Status: DC

## 2024-01-07 MED ORDER — AMLODIPINE BESYLATE 10 MG PO TABS
10.0000 mg | ORAL_TABLET | Freq: Every day | ORAL | 1 refills | Status: DC
Start: 2024-01-07 — End: 2024-04-05

## 2024-01-07 MED ORDER — TIZANIDINE HCL 4 MG PO TABS: 4.0000 mg | ORAL_TABLET | Freq: Three times a day (TID) | ORAL | 0 refills | Status: DC | PRN

## 2024-01-07 MED ORDER — SERTRALINE HCL 50 MG PO TABS: 50.0000 mg | ORAL_TABLET | Freq: Every day | ORAL | 1 refills | Status: DC

## 2024-01-07 MED ORDER — DICLOFENAC SODIUM 1 % EX GEL: 2.0000 g | Freq: Four times a day (QID) | CUTANEOUS | 1 refills | Status: DC

## 2024-01-07 NOTE — Telephone Encounter (Signed)
 I received a call from Yolanda/APS and she stated that after review, the referral does not meet the APS criteria and they recommend contacting CPS
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
## 2024-01-07 NOTE — Telephone Encounter (Signed)
 CT of abdomin, CT Lung screen and Mammogram scheduled with [facility]. Patient is aware and instructions given for  oral IV contrast along with time and location for all appointments.

## 2024-01-07 NOTE — Telephone Encounter (Signed)
 I met with the patient today when she was in the clinic. Her neurology appointment for 04/19/2024 has been cancelled and she was agreeable to having me re-schedule for her.    I called [facility] and rescheduled her for 05/17/2024 @ 0900 and shared this with the patient.

## 2024-01-07 NOTE — Telephone Encounter (Signed)
 Call placed to [facility]: [phone] and I had to leave a message requesting a call back.

## 2024-01-08 ENCOUNTER — Encounter: Payer: Self-pay | Admitting: Internal Medicine

## 2024-01-08 ENCOUNTER — Ambulatory Visit: Payer: Self-pay | Admitting: Internal Medicine

## 2024-01-08 DIAGNOSIS — K8689 Other specified diseases of pancreas: Secondary | ICD-10-CM

## 2024-01-08 DIAGNOSIS — N858 Other specified noninflammatory disorders of uterus: Secondary | ICD-10-CM

## 2024-01-08 LAB — CBC
Hematocrit: 33.3 % — ABNORMAL LOW (ref 34.0–46.6)
Hemoglobin: 10.8 g/dL — ABNORMAL LOW (ref 11.1–15.9)
MCH: 31.9 pg (ref 26.6–33.0)
MCHC: 32.4 g/dL (ref 31.5–35.7)
MCV: 98 fL — ABNORMAL HIGH (ref 79–97)
Platelets: 251 x10E3/uL (ref 150–450)
RBC: 3.39 x10E6/uL — ABNORMAL LOW (ref 3.77–5.28)
RDW: 13 % (ref 11.7–15.4)
WBC: 6.4 x10E3/uL (ref 3.4–10.8)

## 2024-01-08 LAB — COMPREHENSIVE METABOLIC PANEL WITH GFR
ALT: 19 IU/L (ref 0–32)
AST: 26 IU/L (ref 0–40)
Albumin: 4.9 g/dL (ref 3.9–4.9)
Alkaline Phosphatase: 117 IU/L (ref 44–121)
BUN/Creatinine Ratio: 15 (ref 12–28)
BUN: 21 mg/dL (ref 8–27)
Bilirubin Total: <0.2 mg/dL (ref 0.0–1.2)
CO2: 18 mmol/L — ABNORMAL LOW (ref 20–29)
Calcium: 11.3 mg/dL — ABNORMAL HIGH (ref 8.7–10.3)
Chloride: 106 mmol/L (ref 96–106)
Creatinine, Ser: 1.38 mg/dL — ABNORMAL HIGH (ref 0.57–1.00)
Globulin, Total: 2.1 g/dL (ref 1.5–4.5)
Glucose: 88 mg/dL (ref 70–99)
Potassium: 5.2 mmol/L (ref 3.5–5.2)
Sodium: 139 mmol/L (ref 134–144)
Total Protein: 7 g/dL (ref 6.0–8.5)
eGFR: 41 mL/min/1.73 — ABNORMAL LOW (ref >59)

## 2024-01-08 LAB — VITAMIN D 25 HYDROXY (VIT D DEFICIENCY, FRACTURES): Vit D, 25-Hydroxy: 53.4 ng/mL (ref 30.0–100.0)

## 2024-01-08 NOTE — Progress Notes (Signed)
 Patient has mild to moderate anemia that is stable. Kidney function has worsened again since February.  We have referred her to the kidney specialist.  Try to stay hydrated by drinking 4 to 8 glasses of water daily. Liver function test normal. Vitamin D  level is normal.

## 2024-01-13 LAB — PROTEIN ELECTROPHORESIS,RANDOM URN
Albumin ELP, Urine: 49.9
Alpha-1-Globulin, U: 3.7
Alpha-2-Globulin, U: 11.5
Beta Globulin, U: 21.4
Creatinine, Urine: 136 mg/dL
Gamma Globulin, U: 13.7
Protein, Ur: 23.4 mg/dL

## 2024-01-19 ENCOUNTER — Ambulatory Visit

## 2024-01-19 ENCOUNTER — Inpatient Hospital Stay: Admission: RE | Admit: 2024-01-19 | Source: Ambulatory Visit

## 2024-01-19 ENCOUNTER — Other Ambulatory Visit

## 2024-01-25 ENCOUNTER — Encounter: Payer: Self-pay | Admitting: Obstetrics and Gynecology

## 2024-01-26 ENCOUNTER — Ambulatory Visit
Admission: RE | Admit: 2024-01-26 | Discharge: 2024-01-26 | Disposition: A | Discharge status: Home / Self Care | Source: Ambulatory Visit | Attending: Internal Medicine | Admitting: Internal Medicine

## 2024-01-26 DIAGNOSIS — K8689 Other specified diseases of pancreas: Secondary | ICD-10-CM | POA: Diagnosis not present

## 2024-01-26 DIAGNOSIS — R634 Abnormal weight loss: Secondary | ICD-10-CM

## 2024-01-26 DIAGNOSIS — F17210 Nicotine dependence, cigarettes, uncomplicated: Secondary | ICD-10-CM | POA: Diagnosis not present

## 2024-01-26 DIAGNOSIS — K5730 Diverticulosis of large intestine without perforation or abscess without bleeding: Secondary | ICD-10-CM | POA: Diagnosis not present

## 2024-01-26 DIAGNOSIS — K8020 Calculus of gallbladder without cholecystitis without obstruction: Secondary | ICD-10-CM | POA: Diagnosis not present

## 2024-01-26 DIAGNOSIS — Z122 Encounter for screening for malignant neoplasm of respiratory organs: Secondary | ICD-10-CM

## 2024-01-26 MED ORDER — IOPAMIDOL (ISOVUE-300) INJECTION 61%
65.0000 mL | Freq: Once | INTRAVENOUS | Status: AC | PRN
  Administered 2024-01-26: 65 mL via INTRAVENOUS

## 2024-01-27 DIAGNOSIS — J449 Chronic obstructive pulmonary disease, unspecified: Secondary | ICD-10-CM | POA: Diagnosis not present

## 2024-01-27 DIAGNOSIS — J441 Chronic obstructive pulmonary disease with (acute) exacerbation: Secondary | ICD-10-CM | POA: Diagnosis not present

## 2024-01-27 NOTE — Telephone Encounter (Signed)
 PC placed to pt today to go over results of CT scan.  LVMM informing of who I am and reason for call.  Will try her again tomorrow or Friday.

## 2024-01-30 NOTE — Addendum Note (Signed)
 Addended by: VICCI SOBER B on: 01/30/2024 04:20 PM   Modules accepted: Orders

## 2024-01-30 NOTE — Telephone Encounter (Signed)
 Phone call placed to patient today to go over the results of CAT scans.  Patient informed that the CAT scan of the chest did not show any suspicious masses in the lungs.  Incidental finding of emphysematous changes and aortic atherosclerosis. CAT scan of the abdomen showed a possible mass in the head of the pancreas and in the uterus.  MRCP with and without contrast is recommended for the abdomen and MRI of the pelvis.  Patient is agreeable with moving forward with these imaging studies.  I told her that further management will be based on results.  All questions were answered.

## 2024-02-09 NOTE — Progress Notes (Signed)
 This encounter was created in error - please disregard.

## 2024-02-12 ENCOUNTER — Other Ambulatory Visit: Payer: Self-pay | Admitting: Internal Medicine

## 2024-02-12 DIAGNOSIS — J449 Chronic obstructive pulmonary disease, unspecified: Secondary | ICD-10-CM

## 2024-02-25 ENCOUNTER — Emergency Department (HOSPITAL_COMMUNITY)

## 2024-02-25 ENCOUNTER — Inpatient Hospital Stay (HOSPITAL_COMMUNITY)
Admission: EM | Admit: 2024-02-25 | Discharge: 2024-03-01 | DRG: 521 | Disposition: A | Discharge status: Skilled Nursing Facility | Attending: Internal Medicine | Admitting: Internal Medicine

## 2024-02-25 ENCOUNTER — Other Ambulatory Visit: Payer: Self-pay

## 2024-02-25 ENCOUNTER — Encounter (HOSPITAL_COMMUNITY): Payer: Self-pay

## 2024-02-25 DIAGNOSIS — F17210 Nicotine dependence, cigarettes, uncomplicated: Secondary | ICD-10-CM | POA: Diagnosis present

## 2024-02-25 DIAGNOSIS — Z79899 Other long term (current) drug therapy: Secondary | ICD-10-CM

## 2024-02-25 DIAGNOSIS — F32A Depression, unspecified: Secondary | ICD-10-CM | POA: Diagnosis present

## 2024-02-25 DIAGNOSIS — Z880 Allergy status to penicillin: Secondary | ICD-10-CM

## 2024-02-25 DIAGNOSIS — S72002A Fracture of unspecified part of neck of left femur, initial encounter for closed fracture: Secondary | ICD-10-CM | POA: Diagnosis not present

## 2024-02-25 DIAGNOSIS — Z841 Family history of disorders of kidney and ureter: Secondary | ICD-10-CM

## 2024-02-25 DIAGNOSIS — Z681 Body mass index (BMI) 19 or less, adult: Secondary | ICD-10-CM

## 2024-02-25 DIAGNOSIS — K219 Gastro-esophageal reflux disease without esophagitis: Secondary | ICD-10-CM | POA: Diagnosis present

## 2024-02-25 DIAGNOSIS — Y92008 Other place in unspecified non-institutional (private) residence as the place of occurrence of the external cause: Secondary | ICD-10-CM

## 2024-02-25 DIAGNOSIS — I10 Essential (primary) hypertension: Secondary | ICD-10-CM | POA: Diagnosis not present

## 2024-02-25 DIAGNOSIS — I129 Hypertensive chronic kidney disease with stage 1 through stage 4 chronic kidney disease, or unspecified chronic kidney disease: Secondary | ICD-10-CM | POA: Diagnosis present

## 2024-02-25 DIAGNOSIS — N1832 Chronic kidney disease, stage 3b: Secondary | ICD-10-CM | POA: Diagnosis present

## 2024-02-25 DIAGNOSIS — Z5941 Food insecurity: Secondary | ICD-10-CM

## 2024-02-25 DIAGNOSIS — D62 Acute posthemorrhagic anemia: Secondary | ICD-10-CM | POA: Diagnosis present

## 2024-02-25 DIAGNOSIS — W19XXXA Unspecified fall, initial encounter: Secondary | ICD-10-CM

## 2024-02-25 DIAGNOSIS — D649 Anemia, unspecified: Secondary | ICD-10-CM | POA: Diagnosis present

## 2024-02-25 DIAGNOSIS — F419 Anxiety disorder, unspecified: Secondary | ICD-10-CM | POA: Diagnosis present

## 2024-02-25 DIAGNOSIS — M47812 Spondylosis without myelopathy or radiculopathy, cervical region: Secondary | ICD-10-CM | POA: Diagnosis present

## 2024-02-25 DIAGNOSIS — Z7951 Long term (current) use of inhaled steroids: Secondary | ICD-10-CM

## 2024-02-25 DIAGNOSIS — F1011 Alcohol abuse, in remission: Secondary | ICD-10-CM | POA: Diagnosis present

## 2024-02-25 DIAGNOSIS — S72012A Unspecified intracapsular fracture of left femur, initial encounter for closed fracture: Principal | ICD-10-CM | POA: Diagnosis present

## 2024-02-25 DIAGNOSIS — Z888 Allergy status to other drugs, medicaments and biological substances status: Secondary | ICD-10-CM

## 2024-02-25 DIAGNOSIS — Y998 Other external cause status: Secondary | ICD-10-CM

## 2024-02-25 DIAGNOSIS — W010XXA Fall on same level from slipping, tripping and stumbling without subsequent striking against object, initial encounter: Secondary | ICD-10-CM | POA: Diagnosis present

## 2024-02-25 DIAGNOSIS — Y92038 Other place in apartment as the place of occurrence of the external cause: Secondary | ICD-10-CM

## 2024-02-25 DIAGNOSIS — E785 Hyperlipidemia, unspecified: Secondary | ICD-10-CM | POA: Diagnosis present

## 2024-02-25 DIAGNOSIS — M542 Cervicalgia: Secondary | ICD-10-CM

## 2024-02-25 DIAGNOSIS — S72092A Other fracture of head and neck of left femur, initial encounter for closed fracture: Principal | ICD-10-CM | POA: Diagnosis present

## 2024-02-25 DIAGNOSIS — Z96652 Presence of left artificial knee joint: Secondary | ICD-10-CM | POA: Diagnosis present

## 2024-02-25 DIAGNOSIS — Y9301 Activity, walking, marching and hiking: Secondary | ICD-10-CM | POA: Diagnosis present

## 2024-02-25 DIAGNOSIS — E43 Unspecified severe protein-calorie malnutrition: Secondary | ICD-10-CM | POA: Diagnosis present

## 2024-02-25 DIAGNOSIS — J449 Chronic obstructive pulmonary disease, unspecified: Secondary | ICD-10-CM | POA: Diagnosis not present

## 2024-02-25 DIAGNOSIS — M25552 Pain in left hip: Secondary | ICD-10-CM | POA: Diagnosis not present

## 2024-02-25 MED ORDER — FENTANYL CITRATE PF 50 MCG/ML IJ SOSY
50.0000 ug | PREFILLED_SYRINGE | Freq: Once | INTRAMUSCULAR | Status: AC
  Administered 2024-02-26: 50 ug via INTRAVENOUS
  Filled 2024-02-25: qty 1

## 2024-02-25 MED ORDER — OXYCODONE-ACETAMINOPHEN 5-325 MG PO TABS
1.0000 | ORAL_TABLET | Freq: Once | ORAL | Status: AC
  Administered 2024-02-25: 1 via ORAL
  Filled 2024-02-25: qty 1

## 2024-02-25 NOTE — ED Provider Notes (Signed)
 [place] EMERGENCY DEPARTMENT AT [facility] Provider Note   CSN: [id number] Arrival date & time: 02/25/24  2152     Patient presents with: No chief complaint on file.   Anna Mcguire is a 68 y.o. female with past medical history of COPD, tobacco abuse, EtOH, SIADH, HTN presents to emergency department with EMS for evaluation of left hip pain following fall today.  Reports that she tripped over her shoe and fell onto her left hip.  Has been unable to weight-bear since.  Denies head injury, thinners  {Add pertinent medical, surgical, social history, OB history to HPI:32947} HPI     Prior to Admission medications   Medication Sig Start Date End Date Taking? Authorizing Provider  albuterol  (PROVENTIL ) (2.5 MG/3ML) 0.083% nebulizer solution inhale THE contents of 1 vial PER nebulizer EVERY 6 HOURS AS NEEDED FOR wheezing OR SHORTNESS OF BREATH Patient not taking: Reported on 01/07/2024 12/31/21   Vicci Barnie NOVAK, MD  albuterol  (PROVENTIL ) (2.5 MG/3ML) 0.083% nebulizer solution Take 3 mLs (2.5 mg total) by nebulization every 6 (six) hours as needed for wheezing or shortness of breath. 01/07/24   Vicci Barnie NOVAK, MD  albuterol  (VENTOLIN  HFA) 108 (90 Base) MCG/ACT inhaler INHALE 2 puffs BY MOUTH EVERY 6 HOURS AS NEEDED FOR wheezing OR SHORTNESS OF BREATH 02/12/24   Vicci Barnie NOVAK, MD  amLODipine  (NORVASC ) 10 MG tablet Take 1 tablet (10 mg total) by mouth daily. for blood pressure 01/07/24   Vicci Barnie NOVAK, MD  atorvastatin  (LIPITOR ) 80 MG tablet Take 1 tablet (80 mg total) by mouth daily. 01/07/24   Vicci Barnie NOVAK, MD  diclofenac  Sodium (VOLTAREN  ARTHRITIS PAIN) 1 % GEL Apply 2 g topically 4 (four) times daily. 01/07/24   Vicci Barnie NOVAK, MD  ferrous sulfate  325 (65 FE) MG tablet Take 1 tablet (325 mg total) by mouth daily with breakfast. Patient to pay out of pocket if not covered by her insurance. Patient not taking: Reported on 01/07/2024 05/14/23   Vicci Barnie NOVAK, MD  hydrALAZINE  (APRESOLINE ) 10 MG tablet Take 2 tablets (20 mg total) by mouth in the morning and at bedtime. Stop Avapro  (irbesartan ) 08/26/23   Vicci Barnie NOVAK, MD  mometasone -formoterol  (DULERA ) 100-5 MCG/ACT AERO Inhale 2 puffs into the lungs 2 (two) times daily. 08/20/23   Vicci Barnie NOVAK, MD  pantoprazole  (PROTONIX ) 40 MG tablet TAKE 1 Tablet BY MOUTH TWICE DAILY 01/07/24   Vicci Barnie NOVAK, MD  sertraline  (ZOLOFT ) 50 MG tablet Take 1 tablet (50 mg total) by mouth daily. 01/07/24   Vicci Barnie NOVAK, MD  tiotropium (SPIRIVA  HANDIHALER) 18 MCG inhalation capsule PLACE 1 CAPSULE INTO INHALER AND INHALE DAILY 08/20/23   Vicci Barnie NOVAK, MD  tiZANidine  (ZANAFLEX ) 4 MG tablet TAKE ONE TABLET BY MOUTH EVERY 8 HOURS AS NEEDED FOR MUSCLE SPASMS 02/12/24   Vicci Barnie NOVAK, MD  Vitamin D , Cholecalciferol , 10 MCG (400 UNIT) CAPS Take 400 Int'l Units by mouth daily. 03/26/23   Vicci Barnie NOVAK, MD  fluticasone  [facility]) 50 MCG/ACT nasal spray Place 1 spray into both nostrils daily.  05/12/19  [provider]  lovastatin (MEVACOR) 20 MG tablet Take 20 mg by mouth daily at 6 PM.  05/12/19  [provider]    Allergies: Chantix [varenicline tartrate] and Penicillins    Review of Systems  Musculoskeletal:  Positive for arthralgias.    Updated Vital Signs There were no vitals taken for this visit.  Physical Exam Vitals and nursing note reviewed.  Constitutional:      General: She is not in acute distress.    Appearance: Normal appearance. She is not diaphoretic.  HENT:     Head: Normocephalic and atraumatic.     Comments: No hematoma nor TTP of cranium No crepitus to facial bones    Right Ear: External ear normal. No hemotympanum.     Left Ear: External ear normal. No hemotympanum.     Nose: Nose normal.     Right Nostril: No epistaxis or septal hematoma.     Left Nostril: No epistaxis or septal hematoma.     Mouth/Throat:     Mouth: Mucous membranes are moist. No  injury or lacerations.  Eyes:     General:        Right eye: No discharge.        Left eye: No discharge.     Extraocular Movements: Extraocular movements intact.     Conjunctiva/sclera: Conjunctivae normal.     Pupils: Pupils are equal, round, and reactive to light.     Comments: No subconjunctival hemorrhage, hyphema, tear drop pupil, or fluid leakage bilaterally  Neck:     Vascular: No carotid bruit.  Cardiovascular:     Rate and Rhythm: Normal rate.     Pulses: Normal pulses.          Radial pulses are 2+ on the right side and 2+ on the left side.       Dorsalis pedis pulses are 2+ on the right side and 2+ on the left side.  Pulmonary:     Effort: Pulmonary effort is normal. No respiratory distress.     Breath sounds: Normal breath sounds. No wheezing.  Chest:     Chest wall: No tenderness.  Abdominal:     General: Bowel sounds are normal. There is no distension.     Palpations: Abdomen is soft.     Tenderness: There is no abdominal tenderness. There is no guarding or rebound.  Musculoskeletal:     Cervical back: Full passive range of motion without pain, normal range of motion and neck supple. No deformity, rigidity or bony tenderness. Normal range of motion.     Thoracic back: No deformity or bony tenderness. Normal range of motion.     Lumbar back: No deformity or bony tenderness. Normal range of motion.     Right hip: No bony tenderness or crepitus.     Left hip: No bony tenderness or crepitus.     Right lower leg: No edema.     Left lower leg: No edema.     Comments: No obvious deformity to joints or long bones Pelvis stable with no shortening or rotation of LE bilaterally  Skin:    General: Skin is warm and dry.     Capillary Refill: Capillary refill takes less than 2 seconds.  Neurological:     General: No focal deficit present.     Mental Status: She is alert and oriented to person, place, and time. Mental status is at baseline.     GCS: GCS eye subscore is 4. GCS  verbal subscore is 5. GCS motor subscore is 6.     Cranial Nerves: Cranial nerves 2-12 are intact. No cranial nerve deficit.     Sensory: Sensation is intact. No sensory deficit.     Motor: Motor function is intact. No weakness or tremor.     Coordination: Coordination is intact. Coordination normal. Finger-Nose-Finger Test and Heel to [facility] Test normal.  Gait: Gait is intact. Gait normal.     Deep Tendon Reflexes: Reflexes are normal and symmetric. Reflexes normal.     Comments: Acting following commands appropriately     (all labs ordered are listed, but only abnormal results are displayed) Labs Reviewed - No data to display  EKG: None  Radiology: No results found.  {Document cardiac monitor, telemetry assessment procedure when appropriate:32947} Procedures   Medications Ordered in the ED - No data to display    {Click here for ABCD2, HEART and other calculators REFRESH Note before signing:1}                              Medical Decision Making Amount and/or Complexity of Data Reviewed Radiology: ordered.  Risk Prescription drug management. Decision regarding hospitalization.     Patient presents to the ED for concern of ***, this involves an extensive number of treatment options, and is a complaint that carries with it a high risk of complications and morbidity.  The differential diagnosis includes ***   Co morbidities that complicate the patient evaluation  ***   Additional history obtained:  Additional history obtained from *** {Blank multiple:19196::EMS,Family,Nursing,Outside Medical Records,Past Admission}   External records from outside source obtained and reviewed including ***   Lab Tests:  I Ordered, and personally interpreted labs.  The pertinent results include:  ***   Imaging Studies ordered:  I ordered imaging studies including ***  I independently visualized and interpreted imaging which showed *** I agree with the radiologist  interpretation   Cardiac Monitoring:  The patient was maintained on a cardiac monitor.  I personally viewed and interpreted the cardiac monitored which showed an underlying rhythm of: ***   Medicines ordered and prescription drug management:  I ordered medication including ***  for ***  Reevaluation of the patient after these medicines showed that the patient {resolved/improved/worsened:23923::improved} I have reviewed the patients home medicines and have made adjustments as needed   Test Considered:  ***   Critical Interventions:  ***   Consultations Obtained:  I requested consultation with ortho surgery Dr. Edna,  and discussed lab and imaging findings as well as pertinent plan - they recommend:  NPO at midnight Medicine admission I requested consultation with hospitalist,  and discussed lab and imaging findings as well as pertinent plan -    Problem List / ED Course:  Left femur fracture Closed DP 2+ and well perfused ext Does not appear to need emergent reduction in ED   Reevaluation:  After the interventions noted above, I reevaluated the patient and found that they have :{resolved/improved/worsened:23923::improved}   Social Determinants of Health:  ***   Dispostion:  After consideration of the diagnostic results and the patients response to treatment, I feel that the patent would benefit from ***.    {Document critical care time when appropriate  Document review of labs and clinical decision tools ie CHADS2VASC2, etc  Document your independent review of radiology images and any outside records  Document your discussion with family members, caretakers and with consultants  Document social determinants of health affecting pt's care  Document your decision making why or why not admission, treatments were needed:32947:::1}   Final diagnoses:  None    ED Discharge Orders     None

## 2024-02-25 NOTE — ED Triage Notes (Signed)
 Pt bib ems c/o mechanical fall. Pt reportedly tripped over her shoe. Complains of pain to left elbow, left arm and left hips. Denies hitting head, LOC. Pt denies blood thinners

## 2024-02-25 NOTE — ED Notes (Signed)
 Patient transported to CT

## 2024-02-25 NOTE — Progress Notes (Signed)
 Patient with left femoral neck fracture, plan for left total hip Friday afternoon. NPO after midnight. Hold anticoagulation. Full consult note to follow in the AM.

## 2024-02-26 ENCOUNTER — Inpatient Hospital Stay (HOSPITAL_COMMUNITY): Admitting: Anesthesiology

## 2024-02-26 ENCOUNTER — Emergency Department (HOSPITAL_COMMUNITY)

## 2024-02-26 ENCOUNTER — Encounter (HOSPITAL_COMMUNITY)
Admission: EM | Disposition: A | Discharge status: Skilled Nursing Facility | Payer: Self-pay | Source: Home / Self Care | Attending: Internal Medicine

## 2024-02-26 ENCOUNTER — Inpatient Hospital Stay (HOSPITAL_COMMUNITY)

## 2024-02-26 ENCOUNTER — Encounter (HOSPITAL_COMMUNITY): Payer: Self-pay | Admitting: Emergency Medicine

## 2024-02-26 ENCOUNTER — Other Ambulatory Visit: Payer: Self-pay

## 2024-02-26 DIAGNOSIS — Z96652 Presence of left artificial knee joint: Secondary | ICD-10-CM | POA: Diagnosis present

## 2024-02-26 DIAGNOSIS — N1832 Chronic kidney disease, stage 3b: Secondary | ICD-10-CM | POA: Diagnosis present

## 2024-02-26 DIAGNOSIS — Y998 Other external cause status: Secondary | ICD-10-CM | POA: Diagnosis not present

## 2024-02-26 DIAGNOSIS — Z888 Allergy status to other drugs, medicaments and biological substances status: Secondary | ICD-10-CM | POA: Diagnosis not present

## 2024-02-26 DIAGNOSIS — Y92038 Other place in apartment as the place of occurrence of the external cause: Secondary | ICD-10-CM | POA: Diagnosis not present

## 2024-02-26 DIAGNOSIS — I10 Essential (primary) hypertension: Secondary | ICD-10-CM

## 2024-02-26 DIAGNOSIS — Y9301 Activity, walking, marching and hiking: Secondary | ICD-10-CM | POA: Diagnosis present

## 2024-02-26 DIAGNOSIS — K219 Gastro-esophageal reflux disease without esophagitis: Secondary | ICD-10-CM | POA: Diagnosis present

## 2024-02-26 DIAGNOSIS — F17210 Nicotine dependence, cigarettes, uncomplicated: Secondary | ICD-10-CM | POA: Diagnosis present

## 2024-02-26 DIAGNOSIS — D62 Acute posthemorrhagic anemia: Secondary | ICD-10-CM | POA: Diagnosis present

## 2024-02-26 DIAGNOSIS — W010XXA Fall on same level from slipping, tripping and stumbling without subsequent striking against object, initial encounter: Secondary | ICD-10-CM | POA: Diagnosis present

## 2024-02-26 DIAGNOSIS — S72002A Fracture of unspecified part of neck of left femur, initial encounter for closed fracture: Secondary | ICD-10-CM | POA: Diagnosis present

## 2024-02-26 DIAGNOSIS — I129 Hypertensive chronic kidney disease with stage 1 through stage 4 chronic kidney disease, or unspecified chronic kidney disease: Secondary | ICD-10-CM | POA: Diagnosis present

## 2024-02-26 DIAGNOSIS — F1011 Alcohol abuse, in remission: Secondary | ICD-10-CM | POA: Diagnosis present

## 2024-02-26 DIAGNOSIS — Z841 Family history of disorders of kidney and ureter: Secondary | ICD-10-CM | POA: Diagnosis not present

## 2024-02-26 DIAGNOSIS — M47812 Spondylosis without myelopathy or radiculopathy, cervical region: Secondary | ICD-10-CM | POA: Diagnosis present

## 2024-02-26 DIAGNOSIS — F419 Anxiety disorder, unspecified: Secondary | ICD-10-CM | POA: Diagnosis present

## 2024-02-26 DIAGNOSIS — Z880 Allergy status to penicillin: Secondary | ICD-10-CM | POA: Diagnosis not present

## 2024-02-26 DIAGNOSIS — J449 Chronic obstructive pulmonary disease, unspecified: Secondary | ICD-10-CM

## 2024-02-26 DIAGNOSIS — M25552 Pain in left hip: Secondary | ICD-10-CM | POA: Diagnosis present

## 2024-02-26 DIAGNOSIS — Z79899 Other long term (current) drug therapy: Secondary | ICD-10-CM | POA: Diagnosis not present

## 2024-02-26 DIAGNOSIS — Z7951 Long term (current) use of inhaled steroids: Secondary | ICD-10-CM | POA: Diagnosis not present

## 2024-02-26 DIAGNOSIS — S72092A Other fracture of head and neck of left femur, initial encounter for closed fracture: Secondary | ICD-10-CM | POA: Diagnosis present

## 2024-02-26 DIAGNOSIS — E785 Hyperlipidemia, unspecified: Secondary | ICD-10-CM | POA: Diagnosis present

## 2024-02-26 DIAGNOSIS — E43 Unspecified severe protein-calorie malnutrition: Secondary | ICD-10-CM | POA: Diagnosis present

## 2024-02-26 DIAGNOSIS — F32A Depression, unspecified: Secondary | ICD-10-CM | POA: Diagnosis present

## 2024-02-26 DIAGNOSIS — S72012A Unspecified intracapsular fracture of left femur, initial encounter for closed fracture: Secondary | ICD-10-CM | POA: Diagnosis present

## 2024-02-26 DIAGNOSIS — Y92008 Other place in unspecified non-institutional (private) residence as the place of occurrence of the external cause: Secondary | ICD-10-CM | POA: Diagnosis not present

## 2024-02-26 DIAGNOSIS — Z681 Body mass index (BMI) 19 or less, adult: Secondary | ICD-10-CM | POA: Diagnosis not present

## 2024-02-26 DIAGNOSIS — Z5941 Food insecurity: Secondary | ICD-10-CM | POA: Diagnosis not present

## 2024-02-26 HISTORY — PX: TOTAL HIP ARTHROPLASTY: SHX124

## 2024-02-26 LAB — CBC
HCT: 29 % — ABNORMAL LOW (ref 36.0–46.0)
Hemoglobin: 9.7 g/dL — ABNORMAL LOW (ref 12.0–15.0)
MCH: 32.3 pg (ref 26.0–34.0)
MCHC: 33.4 g/dL (ref 30.0–36.0)
MCV: 96.7 fL (ref 80.0–100.0)
Platelets: 213 K/uL (ref 150–400)
RBC: 3 MIL/uL — ABNORMAL LOW (ref 3.87–5.11)
RDW: 12.6 % (ref 11.5–15.5)
WBC: 7.8 K/uL (ref 4.0–10.5)
nRBC: 0 % (ref 0.0–0.2)

## 2024-02-26 LAB — RETICULOCYTES
Immature Retic Fract: 5.8 % (ref 2.3–15.9)
RBC.: 2.99 MIL/uL — ABNORMAL LOW (ref 3.87–5.11)
Retic Count, Absolute: 24.8 K/uL (ref 19.0–186.0)
Retic Ct Pct: 0.8 % (ref 0.4–3.1)

## 2024-02-26 LAB — BASIC METABOLIC PANEL WITH GFR
Anion gap: 8 (ref 5–15)
BUN: 23 mg/dL (ref 8–23)
CO2: 21 mmol/L — ABNORMAL LOW (ref 22–32)
Calcium: 10.7 mg/dL — ABNORMAL HIGH (ref 8.9–10.3)
Chloride: 105 mmol/L (ref 98–111)
Creatinine, Ser: 0.97 mg/dL (ref 0.44–1.00)
GFR, Estimated: >60 mL/min (ref >60)
Glucose, Bld: 141 mg/dL — ABNORMAL HIGH (ref 70–99)
Potassium: 4.3 mmol/L (ref 3.5–5.1)
Sodium: 134 mmol/L — ABNORMAL LOW (ref 135–145)

## 2024-02-26 LAB — TYPE AND SCREEN
ABO/RH(D): O POS
Antibody Screen: NEGATIVE

## 2024-02-26 LAB — IRON AND TIBC
Iron: 19 ug/dL — ABNORMAL LOW (ref 28–170)
Saturation Ratios: 6 % — ABNORMAL LOW (ref 10.4–31.8)
TIBC: 328 ug/dL (ref 250–450)
UIBC: 309 ug/dL

## 2024-02-26 LAB — COMPREHENSIVE METABOLIC PANEL WITH GFR
ALT: 31 U/L (ref 0–44)
AST: 40 U/L (ref 15–41)
Albumin: 4.2 g/dL (ref 3.5–5.0)
Alkaline Phosphatase: 81 U/L (ref 38–126)
Anion gap: 11 (ref 5–15)
BUN: 26 mg/dL — ABNORMAL HIGH (ref 8–23)
CO2: 21 mmol/L — ABNORMAL LOW (ref 22–32)
Calcium: 11.1 mg/dL — ABNORMAL HIGH (ref 8.9–10.3)
Chloride: 103 mmol/L (ref 98–111)
Creatinine, Ser: 1.07 mg/dL — ABNORMAL HIGH (ref 0.44–1.00)
GFR, Estimated: 56 mL/min — ABNORMAL LOW (ref >60)
Glucose, Bld: 126 mg/dL — ABNORMAL HIGH (ref 70–99)
Potassium: 3.7 mmol/L (ref 3.5–5.1)
Sodium: 135 mmol/L (ref 135–145)
Total Bilirubin: 0.3 mg/dL (ref 0.0–1.2)
Total Protein: 6.6 g/dL (ref 6.5–8.1)

## 2024-02-26 LAB — CBC WITH DIFFERENTIAL/PLATELET
Abs Immature Granulocytes: 0.04 K/uL (ref 0.00–0.07)
Basophils Absolute: 0 K/uL (ref 0.0–0.1)
Basophils Relative: 1 %
Eosinophils Absolute: 0.1 K/uL (ref 0.0–0.5)
Eosinophils Relative: 1 %
HCT: 29.5 % — ABNORMAL LOW (ref 36.0–46.0)
Hemoglobin: 9.6 g/dL — ABNORMAL LOW (ref 12.0–15.0)
Immature Granulocytes: 1 %
Lymphocytes Relative: 9 %
Lymphs Abs: 0.8 K/uL (ref 0.7–4.0)
MCH: 31.9 pg (ref 26.0–34.0)
MCHC: 32.5 g/dL (ref 30.0–36.0)
MCV: 98 fL (ref 80.0–100.0)
Monocytes Absolute: 0.3 K/uL (ref 0.1–1.0)
Monocytes Relative: 4 %
Neutro Abs: 7.5 K/uL (ref 1.7–7.7)
Neutrophils Relative %: 84 %
Platelets: 224 K/uL (ref 150–400)
RBC: 3.01 MIL/uL — ABNORMAL LOW (ref 3.87–5.11)
RDW: 12.5 % (ref 11.5–15.5)
WBC: 8.8 K/uL (ref 4.0–10.5)
nRBC: 0 % (ref 0.0–0.2)

## 2024-02-26 LAB — VITAMIN B12
Vitamin B-12: 219 pg/mL (ref 180–914)
Vitamin B-12: 226 pg/mL (ref 180–914)

## 2024-02-26 LAB — FERRITIN: Ferritin: 129 ng/mL (ref 11–307)

## 2024-02-26 LAB — SURGICAL PCR SCREEN
MRSA, PCR: NEGATIVE
Staphylococcus aureus: NEGATIVE

## 2024-02-26 LAB — HIV ANTIBODY (ROUTINE TESTING W REFLEX): HIV Screen 4th Generation wRfx: NONREACTIVE

## 2024-02-26 LAB — FOLATE: Folate: 13.9 ng/mL (ref >5.9)

## 2024-02-26 LAB — VITAMIN D 25 HYDROXY (VIT D DEFICIENCY, FRACTURES): Vit D, 25-Hydroxy: 32.29 ng/mL (ref 30–100)

## 2024-02-26 SURGERY — ARTHROPLASTY, HIP, TOTAL,POSTERIOR APPROACH
Anesthesia: General | Site: Hip | Laterality: Left

## 2024-02-26 MED ORDER — CEFAZOLIN SODIUM-DEXTROSE 2-4 GM/100ML-% IV SOLN
2.0000 g | INTRAVENOUS | Status: AC
  Administered 2024-02-26: 2 g via INTRAVENOUS

## 2024-02-26 MED ORDER — POTASSIUM CHLORIDE IN NACL 20-0.45 MEQ/L-% IV SOLN
INTRAVENOUS | Status: AC
  Filled 2024-02-26 (×3): qty 1000

## 2024-02-26 MED ORDER — ATORVASTATIN CALCIUM 80 MG PO TABS
80.0000 mg | ORAL_TABLET | Freq: Every day | ORAL | Status: DC
Start: 2024-02-26 — End: 2024-03-01
  Administered 2024-02-26 – 2024-03-01 (×5): 80 mg via ORAL
  Filled 2024-02-26 (×3): qty 1
  Filled 2024-02-26: qty 2
  Filled 2024-02-26: qty 1

## 2024-02-26 MED ORDER — TRANEXAMIC ACID-NACL 1000-0.7 MG/100ML-% IV SOLN
INTRAVENOUS | Status: AC
  Filled 2024-02-26: qty 100

## 2024-02-26 MED ORDER — ALBUTEROL SULFATE (2.5 MG/3ML) 0.083% IN NEBU
2.5000 mg | INHALATION_SOLUTION | Freq: Four times a day (QID) | RESPIRATORY_TRACT | Status: DC | PRN
  Filled 2024-02-26: qty 3

## 2024-02-26 MED ORDER — SERTRALINE HCL 50 MG PO TABS
50.0000 mg | ORAL_TABLET | Freq: Every day | ORAL | Status: DC
  Administered 2024-02-26 – 2024-03-01 (×5): 50 mg via ORAL
  Filled 2024-02-26 (×5): qty 1

## 2024-02-26 MED ORDER — UMECLIDINIUM BROMIDE 62.5 MCG/ACT IN AEPB
1.0000 | INHALATION_SPRAY | Freq: Every day | RESPIRATORY_TRACT | Status: DC
  Administered 2024-02-26 – 2024-03-01 (×5): 1 via RESPIRATORY_TRACT
  Filled 2024-02-26: qty 7

## 2024-02-26 MED ORDER — GLYCOPYRROLATE PF 0.2 MG/ML IJ SOSY
PREFILLED_SYRINGE | INTRAMUSCULAR | Status: AC
  Filled 2024-02-26: qty 2

## 2024-02-26 MED ORDER — EPHEDRINE SULFATE-NACL 50-0.9 MG/10ML-% IV SOSY
PREFILLED_SYRINGE | INTRAVENOUS | Status: DC | PRN
  Administered 2024-02-26: 5 mg via INTRAVENOUS
  Administered 2024-02-26: 10 mg via INTRAVENOUS
  Administered 2024-02-26: 5 mg via INTRAVENOUS

## 2024-02-26 MED ORDER — FENTANYL CITRATE (PF) 250 MCG/5ML IJ SOLN
INTRAMUSCULAR | Status: AC
  Filled 2024-02-26: qty 5

## 2024-02-26 MED ORDER — OXYCODONE HCL 5 MG PO TABS: 5.0000 mg | ORAL_TABLET | Freq: Once | ORAL | Status: DC | PRN

## 2024-02-26 MED ORDER — TRANEXAMIC ACID-NACL 1000-0.7 MG/100ML-% IV SOLN
1000.0000 mg | INTRAVENOUS | Status: AC
  Administered 2024-02-26: 1000 mg via INTRAVENOUS

## 2024-02-26 MED ORDER — PHENYLEPHRINE 80 MCG/ML (10ML) SYRINGE FOR IV PUSH (FOR BLOOD PRESSURE SUPPORT)
PREFILLED_SYRINGE | INTRAVENOUS | Status: DC | PRN
  Administered 2024-02-26: 80 ug via INTRAVENOUS
  Administered 2024-02-26: 160 ug via INTRAVENOUS

## 2024-02-26 MED ORDER — HYDROMORPHONE HCL 1 MG/ML IJ SOLN
INTRAMUSCULAR | Status: DC | PRN
  Administered 2024-02-26: .5 mg via INTRAVENOUS

## 2024-02-26 MED ORDER — PHENYLEPHRINE 80 MCG/ML (10ML) SYRINGE FOR IV PUSH (FOR BLOOD PRESSURE SUPPORT)
PREFILLED_SYRINGE | INTRAVENOUS | Status: AC
  Filled 2024-02-26: qty 30

## 2024-02-26 MED ORDER — SURGIPHOR WOUND IRRIGATION SYSTEM - OPTIME
TOPICAL | Status: DC | PRN
  Administered 2024-02-26: 450 mL

## 2024-02-26 MED ORDER — ACETAMINOPHEN 500 MG PO TABS: 1000.0000 mg | ORAL_TABLET | Freq: Once | ORAL | Status: AC

## 2024-02-26 MED ORDER — OXYCODONE HCL 5 MG PO TABS
5.0000 mg | ORAL_TABLET | ORAL | Status: DC | PRN
  Administered 2024-02-26 – 2024-02-28 (×5): 5 mg via ORAL
  Filled 2024-02-26 (×7): qty 1

## 2024-02-26 MED ORDER — FENTANYL CITRATE (PF) 250 MCG/5ML IJ SOLN
INTRAMUSCULAR | Status: DC | PRN
  Administered 2024-02-26: 100 ug via INTRAVENOUS
  Administered 2024-02-26: 50 ug via INTRAVENOUS
  Administered 2024-02-26: 25 ug via INTRAVENOUS

## 2024-02-26 MED ORDER — MORPHINE SULFATE (PF) 2 MG/ML IV SOLN
0.5000 mg | INTRAVENOUS | Status: DC | PRN
  Administered 2024-02-26 – 2024-02-27 (×2): 2 mg via INTRAVENOUS
  Filled 2024-02-26 (×2): qty 1

## 2024-02-26 MED ORDER — PHENYLEPHRINE 80 MCG/ML (10ML) SYRINGE FOR IV PUSH (FOR BLOOD PRESSURE SUPPORT)
PREFILLED_SYRINGE | INTRAVENOUS | Status: AC
  Filled 2024-02-26: qty 10

## 2024-02-26 MED ORDER — HYDROMORPHONE HCL 1 MG/ML IJ SOLN
0.5000 mg | INTRAMUSCULAR | Status: DC | PRN
  Administered 2024-02-27: 1 mg via INTRAVENOUS
  Administered 2024-02-28: 0.5 mg via INTRAVENOUS
  Filled 2024-02-26 (×2): qty 1

## 2024-02-26 MED ORDER — ACETAMINOPHEN 500 MG PO TABS
ORAL_TABLET | ORAL | Status: AC
Start: 2024-02-26 — End: 2024-02-26
  Administered 2024-02-26: 1000 mg via ORAL
  Filled 2024-02-26: qty 2

## 2024-02-26 MED ORDER — METHOCARBAMOL 500 MG PO TABS
500.0000 mg | ORAL_TABLET | Freq: Four times a day (QID) | ORAL | Status: DC | PRN
  Administered 2024-02-26 – 2024-03-01 (×3): 500 mg via ORAL
  Filled 2024-02-26 (×3): qty 1

## 2024-02-26 MED ORDER — EPHEDRINE 5 MG/ML INJ
INTRAVENOUS | Status: AC
  Filled 2024-02-26: qty 5

## 2024-02-26 MED ORDER — PANTOPRAZOLE SODIUM 40 MG PO TBEC
40.0000 mg | DELAYED_RELEASE_TABLET | Freq: Two times a day (BID) | ORAL | Status: DC
  Administered 2024-02-26 – 2024-03-01 (×10): 40 mg via ORAL
  Filled 2024-02-26 (×10): qty 1

## 2024-02-26 MED ORDER — LACTATED RINGERS IV SOLN: INTRAVENOUS | Status: DC | PRN

## 2024-02-26 MED ORDER — BUPIVACAINE LIPOSOME 1.3 % IJ SUSP: INTRAMUSCULAR | Status: DC | PRN

## 2024-02-26 MED ORDER — LIDOCAINE 2% (20 MG/ML) 5 ML SYRINGE
INTRAMUSCULAR | Status: AC
  Filled 2024-02-26: qty 5

## 2024-02-26 MED ORDER — ONDANSETRON HCL 4 MG/2ML IJ SOLN
INTRAMUSCULAR | Status: DC | PRN
  Administered 2024-02-26: 4 mg via INTRAVENOUS

## 2024-02-26 MED ORDER — KETAMINE HCL 10 MG/ML IJ SOLN
INTRAMUSCULAR | Status: DC | PRN
Start: 2024-02-26 — End: 2024-02-26
  Administered 2024-02-26: 30 mg via INTRAVENOUS
  Administered 2024-02-26: 20 mg via INTRAVENOUS

## 2024-02-26 MED ORDER — LIDOCAINE 2% (20 MG/ML) 5 ML SYRINGE
INTRAMUSCULAR | Status: DC | PRN
  Administered 2024-02-26: 40 mg via INTRAVENOUS

## 2024-02-26 MED ORDER — 0.9 % SODIUM CHLORIDE (POUR BTL) OPTIME
TOPICAL | Status: DC | PRN
  Administered 2024-02-26: 1000 mL

## 2024-02-26 MED ORDER — HYDROMORPHONE HCL 1 MG/ML IJ SOLN
INTRAMUSCULAR | Status: AC
  Filled 2024-02-26: qty 0.5

## 2024-02-26 MED ORDER — ALBUMIN HUMAN 5 % IV SOLN: INTRAVENOUS | Status: DC | PRN

## 2024-02-26 MED ORDER — AMISULPRIDE (ANTIEMETIC) 5 MG/2ML IV SOLN: 10.0000 mg | Freq: Once | INTRAVENOUS | Status: DC | PRN

## 2024-02-26 MED ORDER — PROPOFOL 10 MG/ML IV BOLUS
INTRAVENOUS | Status: DC | PRN
Start: 2024-02-26 — End: 2024-02-26
  Administered 2024-02-26: 100 mg via INTRAVENOUS
  Administered 2024-02-26: 20 mg via INTRAVENOUS
  Administered 2024-02-26 (×2): 50 ug/kg/min via INTRAVENOUS

## 2024-02-26 MED ORDER — ONDANSETRON HCL 4 MG/2ML IJ SOLN
INTRAMUSCULAR | Status: AC
  Filled 2024-02-26: qty 2

## 2024-02-26 MED ORDER — ALBUTEROL SULFATE HFA 108 (90 BASE) MCG/ACT IN AERS
INHALATION_SPRAY | RESPIRATORY_TRACT | Status: DC | PRN
  Administered 2024-02-26: 2 via RESPIRATORY_TRACT

## 2024-02-26 MED ORDER — TIOTROPIUM BROMIDE MONOHYDRATE 18 MCG IN CAPS: 18.0000 ug | ORAL_CAPSULE | Freq: Every day | RESPIRATORY_TRACT | Status: DC

## 2024-02-26 MED ORDER — FLUTICASONE FUROATE-VILANTEROL 100-25 MCG/ACT IN AEPB
1.0000 | INHALATION_SPRAY | Freq: Every day | RESPIRATORY_TRACT | Status: DC
  Administered 2024-02-26 – 2024-03-01 (×5): 1 via RESPIRATORY_TRACT
  Filled 2024-02-26 (×2): qty 28

## 2024-02-26 MED ORDER — CHLORHEXIDINE GLUCONATE 0.12 % MT SOLN
OROMUCOSAL | Status: AC
  Administered 2024-02-26: 15 mL
  Filled 2024-02-26: qty 15

## 2024-02-26 MED ORDER — CHLORHEXIDINE GLUCONATE 4 % EX SOLN: 60.0000 mL | Freq: Once | CUTANEOUS | Status: DC

## 2024-02-26 MED ORDER — SUCCINYLCHOLINE CHLORIDE 200 MG/10ML IV SOSY
PREFILLED_SYRINGE | INTRAVENOUS | Status: DC | PRN
  Administered 2024-02-26: 100 mg via INTRAVENOUS

## 2024-02-26 MED ORDER — CEFAZOLIN SODIUM-DEXTROSE 2-4 GM/100ML-% IV SOLN
INTRAVENOUS | Status: AC
  Filled 2024-02-26: qty 100

## 2024-02-26 MED ORDER — SODIUM CHLORIDE 0.9 % IR SOLN
Status: DC | PRN
  Administered 2024-02-26: 1000 mL
  Administered 2024-02-26: 3000 mL

## 2024-02-26 MED ORDER — SODIUM CHLORIDE 0.9 % IV SOLN: 12.5000 mg | INTRAVENOUS | Status: DC | PRN

## 2024-02-26 MED ORDER — HYDROMORPHONE HCL 1 MG/ML IJ SOLN: 0.2500 mg | INTRAMUSCULAR | Status: DC | PRN

## 2024-02-26 MED ORDER — KETAMINE HCL 50 MG/5ML IJ SOSY
PREFILLED_SYRINGE | INTRAMUSCULAR | Status: AC
  Filled 2024-02-26: qty 5

## 2024-02-26 MED ORDER — PROPOFOL 1000 MG/100ML IV EMUL
INTRAVENOUS | Status: AC
  Filled 2024-02-26: qty 100

## 2024-02-26 MED ORDER — SENNOSIDES-DOCUSATE SODIUM 8.6-50 MG PO TABS
1.0000 | ORAL_TABLET | Freq: Every evening | ORAL | Status: DC | PRN
Start: 2024-02-26 — End: 2024-03-01

## 2024-02-26 MED ORDER — HYDRALAZINE HCL 10 MG PO TABS
20.0000 mg | ORAL_TABLET | Freq: Two times a day (BID) | ORAL | Status: DC
  Administered 2024-02-26 – 2024-02-29 (×9): 20 mg via ORAL
  Filled 2024-02-26 (×12): qty 2

## 2024-02-26 MED ORDER — BUPIVACAINE-EPINEPHRINE 0.25% -1:200000 IJ SOLN
INTRAMUSCULAR | Status: DC | PRN
  Administered 2024-02-26: 50 mL

## 2024-02-26 MED ORDER — BUPIVACAINE-EPINEPHRINE (PF) 0.25% -1:200000 IJ SOLN
INTRAMUSCULAR | Status: AC
  Filled 2024-02-26: qty 30

## 2024-02-26 MED ORDER — POVIDONE-IODINE 10 % EX SWAB
2.0000 | Freq: Once | CUTANEOUS | Status: AC
  Administered 2024-02-26: 2 via TOPICAL

## 2024-02-26 MED ORDER — CEFAZOLIN SODIUM-DEXTROSE 2-4 GM/100ML-% IV SOLN
2.0000 g | Freq: Three times a day (TID) | INTRAVENOUS | Status: AC
  Administered 2024-02-26 – 2024-02-27 (×2): 2 g via INTRAVENOUS
  Filled 2024-02-26 (×2): qty 100

## 2024-02-26 MED ORDER — MIDAZOLAM HCL 2 MG/2ML IJ SOLN
INTRAMUSCULAR | Status: AC
  Filled 2024-02-26: qty 2

## 2024-02-26 MED ORDER — ONDANSETRON HCL 4 MG/2ML IJ SOLN
4.0000 mg | Freq: Four times a day (QID) | INTRAMUSCULAR | Status: DC | PRN
  Administered 2024-02-28: 4 mg via INTRAVENOUS
  Filled 2024-02-26: qty 2

## 2024-02-26 MED ORDER — OXYCODONE HCL 5 MG/5ML PO SOLN: 5.0000 mg | Freq: Once | ORAL | Status: DC | PRN

## 2024-02-26 MED ORDER — PHENYLEPHRINE HCL-NACL 20-0.9 MG/250ML-% IV SOLN
INTRAVENOUS | Status: DC | PRN
  Administered 2024-02-26: 50 ug/min via INTRAVENOUS

## 2024-02-26 MED ORDER — METHOCARBAMOL 1000 MG/10ML IJ SOLN: 500.0000 mg | Freq: Four times a day (QID) | INTRAMUSCULAR | Status: DC | PRN

## 2024-02-26 MED ORDER — ACETAMINOPHEN 325 MG PO TABS: 650.0000 mg | ORAL_TABLET | Freq: Four times a day (QID) | ORAL | Status: DC | PRN

## 2024-02-26 MED ORDER — ALBUTEROL SULFATE HFA 108 (90 BASE) MCG/ACT IN AERS
INHALATION_SPRAY | RESPIRATORY_TRACT | Status: AC
  Filled 2024-02-26: qty 6.7

## 2024-02-26 MED ORDER — AMLODIPINE BESYLATE 10 MG PO TABS
10.0000 mg | ORAL_TABLET | Freq: Every day | ORAL | Status: DC
  Administered 2024-02-26 – 2024-02-29 (×4): 10 mg via ORAL
  Filled 2024-02-26 (×4): qty 1
  Filled 2024-02-26: qty 2

## 2024-02-26 MED ORDER — DEXAMETHASONE SODIUM PHOSPHATE 10 MG/ML IJ SOLN
INTRAMUSCULAR | Status: DC | PRN
  Administered 2024-02-26: 10 mg via INTRAVENOUS

## 2024-02-26 MED ORDER — BUPIVACAINE LIPOSOME 1.3 % IJ SUSP
INTRAMUSCULAR | Status: AC
  Filled 2024-02-26: qty 20

## 2024-02-26 MED ORDER — ONDANSETRON 4 MG PO TBDP
4.0000 mg | ORAL_TABLET | Freq: Once | ORAL | Status: AC
  Administered 2024-02-26: 4 mg via ORAL
  Filled 2024-02-26: qty 1

## 2024-02-26 SURGICAL SUPPLY — 55 items
BLADE SAGITTAL 25.0X1.27X90 (BLADE) ×1 IMPLANT
BRUSH FEMORAL CANAL (MISCELLANEOUS) IMPLANT
CEMENT BONE SIMPLEX SPEEDSET (Cement) IMPLANT
CEMENT RESTRICTOR BONE PREP ST (Cement) IMPLANT
CHLORAPREP W/TINT 26 (MISCELLANEOUS) ×2 IMPLANT
COVER SURGICAL LIGHT HANDLE (MISCELLANEOUS) ×1 IMPLANT
DERMABOND ADVANCED .7 DNX12 (GAUZE/BANDAGES/DRESSINGS) IMPLANT
DRAPE HALF SHEET 40X57 (DRAPES) ×1 IMPLANT
DRAPE HIP W/POCKET STRL (MISCELLANEOUS) ×1 IMPLANT
DRAPE INCISE IOBAN 85X60 (DRAPES) ×1 IMPLANT
DRAPE POUCH INSTRU U-SHP 10X18 (DRAPES) ×1 IMPLANT
DRAPE U-SHAPE 47X51 STRL (DRAPES) ×2 IMPLANT
DRSG AQUACEL AG ADV 3.5X10 (GAUZE/BANDAGES/DRESSINGS) ×1 IMPLANT
ELECTRODE BLDE 4.0 EZ CLN MEGD (MISCELLANEOUS) ×1 IMPLANT
GLOVE BIOGEL PI IND STRL 8 (GLOVE) ×1 IMPLANT
GLOVE SRG 8 PF TXTR STRL LF DI (GLOVE) ×1 IMPLANT
GLOVE SURG ORTHO 8.0 STRL STRW (GLOVE) ×2 IMPLANT
GOWN STRL REUS W/ TWL LRG LVL3 (GOWN DISPOSABLE) ×2 IMPLANT
GOWN STRL REUS W/ TWL XL LVL3 (GOWN DISPOSABLE) ×1 IMPLANT
HEAD CERAMIC FEMORAL 36MM (Head) IMPLANT
HOOD PEEL AWAY T7 (MISCELLANEOUS) ×3 IMPLANT
INSERT TRIDENT POLY 36 0DEG (Insert) IMPLANT
KIT BASIN OR (CUSTOM PROCEDURE TRAY) ×1 IMPLANT
KIT TURNOVER KIT B (KITS) ×1 IMPLANT
MANIFOLD NEPTUNE II (INSTRUMENTS) ×1 IMPLANT
MARKER SKIN DUAL TIP RULER LAB (MISCELLANEOUS) ×1 IMPLANT
NDL 18GX1X1/2 (RX/OR ONLY) (NEEDLE) ×1 IMPLANT
NEEDLE 18GX1X1/2 (RX/OR ONLY) (NEEDLE) ×1 IMPLANT
NS IRRIG 1000ML POUR BTL (IV SOLUTION) ×1 IMPLANT
PACK TOTAL JOINT (CUSTOM PROCEDURE TRAY) ×1 IMPLANT
PRESSURIZER FEMORAL UNIV (MISCELLANEOUS) IMPLANT
RETRIEVER SUT HEWSON (MISCELLANEOUS) ×1 IMPLANT
SCREW HEX LP 6.5X15 (Screw) IMPLANT
SCREW HEX LP 6.5X25 (Screw) IMPLANT
SEALER BIPOLAR AQUA 6.0 (INSTRUMENTS) IMPLANT
SET HNDPC FAN SPRY TIP SCT (DISPOSABLE) ×1 IMPLANT
SHELL CLUSTERHOLE ACETABULAR 5 (Shell) IMPLANT
SOLUTION IRRIG SURGIPHOR (IV SOLUTION) ×1 IMPLANT
STEM HIP ACCOLADE SZ4 35X137 (Stem) IMPLANT
SUCTION TUBE FRAZIER 10FR DISP (SUCTIONS) ×1 IMPLANT
SUT BONE WAX W31G (SUTURE) ×1 IMPLANT
SUT ETHIBOND 2 V 37 (SUTURE) ×1 IMPLANT
SUT MNCRL AB 3-0 PS2 18 (SUTURE) ×1 IMPLANT
SUT STRATAFIX 1PDS 45CM VIOLET (SUTURE) ×2 IMPLANT
SUT STRATAFIX SPIRAL PDS+ 0 30 (SUTURE) IMPLANT
SUT VIC AB 0 CT1 27XBRD ANBCTR (SUTURE) ×1 IMPLANT
SUT VIC AB 1 CT1 27XBRD ANBCTR (SUTURE) IMPLANT
SUT VIC AB 2-0 CT2 27 (SUTURE) ×2 IMPLANT
SYR 20ML LL LF (SYRINGE) ×1 IMPLANT
SYR 50ML LL SCALE MARK (SYRINGE) ×1 IMPLANT
TOWEL GREEN STERILE (TOWEL DISPOSABLE) ×1 IMPLANT
TOWER CARTRIDGE SMART MIX (DISPOSABLE) IMPLANT
TRAY FOLEY MTR SLVR 16FR STAT (SET/KITS/TRAYS/PACK) ×1 IMPLANT
TUBE SUCT ARGYLE STRL (TUBING) ×1 IMPLANT
WATER STERILE IRR 1000ML POUR (IV SOLUTION) ×1 IMPLANT

## 2024-02-26 NOTE — H&P (Signed)
 History and Physical    Anna Mcguire FMW:[id number] DOB: 15-Jan-1956 DOA: 02/25/2024  PCP: Anna Barnie NOVAK, MD   Patient coming from: Home   Chief Complaint: Left hip pain after a trip and fall   HPI: Anna Mcguire is a 68 y.o. female with medical history significant for hypertension, hyperlipidemia, CKD 3A, COPD, and alcohol abuse in remission who presents with severe left hip pain after a trip and fall.   Patient reports that she was in her usual state of health and having an uneventful day when she tripped over her shoe and fell onto her left side.  She has been experiencing pain in the left elbow, and severe pain in the left hip with inability to bear weight.  She denies hitting her head or losing consciousness.  She had been doing well prior to this, denies any history of heart disease, does not experience chest discomfort with activity, and has been able to ascend 2 flights of stairs.  She reports that she has cut back significantly on her alcohol intake over the years and now consumes 1 or 2 drinks roughly twice a week.  ED Course: Upon arrival to the ED, patient is found to be afebrile and saturating mid 90s on room air with normal HR and stable BP.  Labs are most notable for creatinine 1.07, calcium  11.1, normal WBC, and hemoglobin 9.6.  Left femoral neck fracture is noted on plain radiographs.  Chest x-ray is negative for acute findings.  EKG demonstrates a sinus rhythm.  Orthopedic surgery was consulted by the ED PA and the patient was treated with fentanyl  and Percocet.  Review of Systems:  All other systems reviewed and apart from HPI, are negative.  Past Medical History:  Diagnosis Date   Acid reflux    Anxiety    Arthritis    COPD (chronic obstructive pulmonary disease) (HCC)    Depression    Dyspnea    Hepatitis    Hypertension     Past Surgical History:  Procedure Laterality Date   COLONOSCOPY WITH PROPOFOL  N/A 06/07/2014   Procedure:  COLONOSCOPY WITH PROPOFOL ;  Surgeon: Elsie Cree, MD;  Location: [facility];  Service: Endoscopy;  Laterality: N/A;   ESOPHAGOGASTRODUODENOSCOPY (EGD) WITH PROPOFOL  N/A 06/07/2014   Procedure: ESOPHAGOGASTRODUODENOSCOPY (EGD) WITH PROPOFOL ;  Surgeon: Elsie Cree, MD;  Location: [facility];  Service: Endoscopy;  Laterality: N/A;   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   Procedure: TOTAL KNEE ARTHROPLASTY;  Surgeon: Edna Toribio LABOR, MD;  Location: [facility];  Service: Orthopedics;  Laterality: Left;    Social History:   reports that she has been smoking cigarettes. She has never used smokeless tobacco. She reports current alcohol use. She reports current drug use. Drug: Marijuana.  Allergies  Allergen Reactions   Chantix [Varenicline Tartrate]     Diarrhea and dizziness   Penicillins Diarrhea and Itching    Family History  Problem Relation Age of Onset   Kidney failure Mother    Aneurysm Father      Prior to Admission medications   Medication Sig Start Date End Date Taking? Authorizing Provider  albuterol  (PROVENTIL ) (2.5 MG/3ML) 0.083% nebulizer solution inhale THE contents of 1 vial PER nebulizer EVERY 6 HOURS AS NEEDED FOR wheezing OR SHORTNESS OF BREATH Patient not taking: Reported on 01/07/2024 12/31/21   Anna Barnie NOVAK, MD  albuterol  (PROVENTIL ) (2.5 MG/3ML) 0.083% nebulizer solution Take 3 mLs (2.5 mg total) by nebulization every 6 (six) hours as needed for wheezing or shortness  of breath. 01/07/24   Anna Barnie NOVAK, MD  albuterol  (VENTOLIN  HFA) [phone] Base) MCG/ACT inhaler INHALE 2 puffs BY MOUTH EVERY 6 HOURS AS NEEDED FOR wheezing OR SHORTNESS OF BREATH 02/12/24   Anna Barnie NOVAK, MD  amLODipine  (NORVASC ) 10 MG tablet Take 1 tablet (10 mg total) by mouth daily. for blood pressure 01/07/24   Anna Barnie NOVAK, MD  atorvastatin  (LIPITOR ) 80 MG tablet Take 1 tablet (80 mg total) by mouth daily. 01/07/24   Anna Barnie NOVAK, MD  diclofenac  Sodium (VOLTAREN  ARTHRITIS  PAIN) 1 % GEL Apply 2 g topically 4 (four) times daily. 01/07/24   Anna Barnie NOVAK, MD  ferrous sulfate  325 (65 FE) MG tablet Take 1 tablet (325 mg total) by mouth daily with breakfast. Patient to pay out of pocket if not covered by her insurance. Patient not taking: Reported on 01/07/2024 05/14/23   Anna Barnie NOVAK, MD  hydrALAZINE  (APRESOLINE ) 10 MG tablet Take 2 tablets (20 mg total) by mouth in the morning and at bedtime. Stop Avapro  (irbesartan ) 08/26/23   Anna Barnie NOVAK, MD  mometasone -formoterol  (DULERA ) 100-5 MCG/ACT AERO Inhale 2 puffs into the lungs 2 (two) times daily. 08/20/23   Anna Barnie NOVAK, MD  pantoprazole  (PROTONIX ) 40 MG tablet TAKE 1 Tablet BY MOUTH TWICE DAILY 01/07/24   Anna Barnie NOVAK, MD  sertraline  (ZOLOFT ) 50 MG tablet Take 1 tablet (50 mg total) by mouth daily. 01/07/24   Anna Barnie NOVAK, MD  tiotropium (SPIRIVA  HANDIHALER) 18 MCG inhalation capsule PLACE 1 CAPSULE INTO INHALER AND INHALE DAILY 08/20/23   Anna Barnie NOVAK, MD  tiZANidine  (ZANAFLEX ) 4 MG tablet TAKE ONE TABLET BY MOUTH EVERY 8 HOURS AS NEEDED FOR MUSCLE SPASMS 02/12/24   Anna Barnie NOVAK, MD  Vitamin D , Cholecalciferol , 10 MCG (400 UNIT) CAPS Take 400 Int'l Units by mouth daily. 03/26/23   Anna Barnie NOVAK, MD  fluticasone  [facility]) 50 MCG/ACT nasal spray Place 1 spray into both nostrils daily.  05/12/19  [provider]  lovastatin (MEVACOR) 20 MG tablet Take 20 mg by mouth daily at 6 PM.  05/12/19  [provider]    Physical Exam: Vitals:   02/25/24 2204 02/25/24 2206 02/25/24 2330 02/26/24 0015  BP:   (!) 147/78   Pulse:   77 77  Resp:  18  20  Temp:      SpO2:   93% 93%  Weight: 52.2 kg     Height: 5' 6 (1.676 m)       Constitutional: NAD, frail-appearing  Eyes: PERTLA, lids and conjunctivae normal ENMT: Mucous membranes are moist. Posterior pharynx clear of any exudate or lesions.   Neck: supple, no masses  Respiratory: no wheezing, no crackles. No accessory  muscle use.  Cardiovascular: S1 & S2 heard, regular rate and rhythm. No extremity edema.  Abdomen: No tenderness, soft. Bowel sounds active.   Musculoskeletal: no clubbing / cyanosis. Left hip tenderness; neurovascularly intact distally.   Skin: no significant rashes, lesions, ulcers. Warm, dry, well-perfused. Neurologic: CN 2-12 grossly intact. Moving all extremities. Alert and oriented.  Psychiatric: Calm. Cooperative.    Labs and Imaging on Admission: I have personally reviewed following labs and imaging studies  CBC: Recent Labs  Lab 02/26/24 0012  WBC 8.8  NEUTROABS 7.5  HGB 9.6*  HCT 29.5*  MCV 98.0  PLT 224   Basic Metabolic Panel: Recent Labs  Lab 02/26/24 0012  NA 135  K 3.7  CL 103  CO2 21*  GLUCOSE 126*  BUN 26*  CREATININE 1.07*  CALCIUM  11.1*   GFR: Estimated Creatinine Clearance: 40.3 mL/min (A) (by C-G formula based on SCr of 1.07 mg/dL (H)). Liver Function Tests: Recent Labs  Lab 02/26/24 0012  AST 40  ALT 31  ALKPHOS 81  BILITOT 0.3  PROT 6.6  ALBUMIN  4.2   No results for input(s): LIPASE, AMYLASE in the last 168 hours. No results for input(s): AMMONIA in the last 168 hours. Coagulation Profile: No results for input(s): INR, PROTIME in the last 168 hours. Cardiac Enzymes: No results for input(s): CKTOTAL, CKMB, CKMBINDEX, TROPONINI in the last 168 hours. BNP (last 3 results) No results for input(s): PROBNP in the last 8760 hours. HbA1C: No results for input(s): HGBA1C in the last 72 hours. CBG: No results for input(s): GLUCAP in the last 168 hours. Lipid Profile: No results for input(s): CHOL, HDL, LDLCALC, TRIG, CHOLHDL, LDLDIRECT in the last 72 hours. Thyroid  Function Tests: No results for input(s): TSH, T4TOTAL, FREET4, T3FREE, THYROIDAB in the last 72 hours. Anemia Panel: No results for input(s): VITAMINB12, FOLATE, FERRITIN, TIBC, IRON, RETICCTPCT in the last 72  hours. Urine analysis:    Component Value Date/Time   COLORURINE YELLOW 01/12/2020 1650   APPEARANCEUR CLEAR 01/12/2020 1650   LABSPEC 1.020 07/19/2021 1708   PHURINE 5.0 07/19/2021 1708   GLUCOSEU NEGATIVE 07/19/2021 1708   HGBUR TRACE (A) 07/19/2021 1708   BILIRUBINUR negative 08/05/2022 0835   KETONESUR negative 08/05/2022 0835   KETONESUR TRACE (A) 07/19/2021 1708   PROTEINUR NEGATIVE 07/19/2021 1708   UROBILINOGEN 0.2 08/05/2022 0835   UROBILINOGEN 0.2 07/19/2021 1708   NITRITE Negative 08/05/2022 0835   NITRITE NEGATIVE 07/19/2021 1708   LEUKOCYTESUR Small (1+) (A) 08/05/2022 0835   LEUKOCYTESUR NEGATIVE 07/19/2021 1708   Sepsis Labs: @LABRCNTIP (procalcitonin:4,lacticidven:4) )No results found for this or any previous visit (from the past 240 hours).   Radiological Exams on Admission: DG CHEST PORT 1 VIEW Result Date: 02/26/2024 CLINICAL DATA:  Preop hip fracture. EXAM: PORTABLE CHEST 1 VIEW COMPARISON:  11/27/2023 FINDINGS: Heart and mediastinal contours are within normal limits. No focal opacities or effusions. No acute bony abnormality. IMPRESSION: No active disease. Electronically Signed   By: Franky Crease M.D.   On: 02/26/2024 00:41   CT Head Wo Contrast Result Date: 02/25/2024 CLINICAL DATA:  Neck trauma EXAM: CT HEAD WITHOUT CONTRAST CT CERVICAL SPINE WITHOUT CONTRAST TECHNIQUE: Multidetector CT imaging of the head and cervical spine was performed following the standard protocol without intravenous contrast. Multiplanar CT image reconstructions of the cervical spine were also generated. RADIATION DOSE REDUCTION: This exam was performed according to the departmental dose-optimization program which includes automated exposure control, adjustment of the mA and/or kV according to patient size and/or use of iterative reconstruction technique. COMPARISON:  CT head and cervical spine 04/14/2020 FINDINGS: CT HEAD FINDINGS Brain: No intracranial hemorrhage or acute infarct. Mildly  hyperdense 9 mm mass along the right inferior frontal falx (series 3, image 21). This is minimally increased from 04/14/2020 when measured 8 mm. Minimal adjacent mass effect on the right frontal lobe. No hydrocephalus. No extra-axial fluid collection. Age related cerebral atrophy and chronic small vessel ischemic disease. Vascular: No hyperdense vessel. Intracranial arterial calcification. Skull: No fracture or focal lesion. Sinuses/Orbits: No acute finding. Other: None. CT CERVICAL SPINE FINDINGS Alignment: No evidence of traumatic malalignment. Grade 2 anterolisthesis of C4 is chronic and progressed from 2021. Skull base and vertebrae: No acute fracture. Soft tissues and spinal canal: No prevertebral fluid or swelling. No  visible canal hematoma. Disc levels: Multilevel spondylosis, disc space height loss, degenerative endplate changes greatest at C4-C5 where it is advanced. There is ankylosis of C5 and C6. Advanced facet arthropathy from C3-C6 bilaterally. No severe spinal canal narrowing. Upper chest: No acute abnormality. Other: Carotid calcification. IMPRESSION: 1. No acute intracranial abnormality. 2. No acute fracture in the cervical spine. 3. 9 mm mass along the right inferior frontal falx, minimally increased from 04/14/2020 when measured 8 mm. This is favored to represent a meningioma. Consider nonemergent MRI with and without IV contrast for confirmation. 4. Multilevel spondylosis and advanced facet arthropathy in the cervical spine. Electronically Signed   By: Norman Gatlin M.D.   On: 02/25/2024 23:23   CT Cervical Spine Wo Contrast Result Date: 02/25/2024 CLINICAL DATA:  Neck trauma EXAM: CT HEAD WITHOUT CONTRAST CT CERVICAL SPINE WITHOUT CONTRAST TECHNIQUE: Multidetector CT imaging of the head and cervical spine was performed following the standard protocol without intravenous contrast. Multiplanar CT image reconstructions of the cervical spine were also generated. RADIATION DOSE REDUCTION: This  exam was performed according to the departmental dose-optimization program which includes automated exposure control, adjustment of the mA and/or kV according to patient size and/or use of iterative reconstruction technique. COMPARISON:  CT head and cervical spine 04/14/2020 FINDINGS: CT HEAD FINDINGS Brain: No intracranial hemorrhage or acute infarct. Mildly hyperdense 9 mm mass along the right inferior frontal falx (series 3, image 21). This is minimally increased from 04/14/2020 when measured 8 mm. Minimal adjacent mass effect on the right frontal lobe. No hydrocephalus. No extra-axial fluid collection. Age related cerebral atrophy and chronic small vessel ischemic disease. Vascular: No hyperdense vessel. Intracranial arterial calcification. Skull: No fracture or focal lesion. Sinuses/Orbits: No acute finding. Other: None. CT CERVICAL SPINE FINDINGS Alignment: No evidence of traumatic malalignment. Grade 2 anterolisthesis of C4 is chronic and progressed from 2021. Skull base and vertebrae: No acute fracture. Soft tissues and spinal canal: No prevertebral fluid or swelling. No visible canal hematoma. Disc levels: Multilevel spondylosis, disc space height loss, degenerative endplate changes greatest at C4-C5 where it is advanced. There is ankylosis of C5 and C6. Advanced facet arthropathy from C3-C6 bilaterally. No severe spinal canal narrowing. Upper chest: No acute abnormality. Other: Carotid calcification. IMPRESSION: 1. No acute intracranial abnormality. 2. No acute fracture in the cervical spine. 3. 9 mm mass along the right inferior frontal falx, minimally increased from 04/14/2020 when measured 8 mm. This is favored to represent a meningioma. Consider nonemergent MRI with and without IV contrast for confirmation. 4. Multilevel spondylosis and advanced facet arthropathy in the cervical spine. Electronically Signed   By: Norman Gatlin M.D.   On: 02/25/2024 23:23   DG Femur Min 2 Views Left Result Date:  02/25/2024 CLINICAL DATA:  Tender to palpation.  Encounter for fracture. EXAM: PELVIS - 1-2 VIEW; LEFT FEMUR 2 VIEWS COMPARISON:  None Available. FINDINGS: Acute displaced fracture of the left subcapital femoral neck. The femoral head remains seated in the acetabulum. There is slight superior dislocation of the femoral shaft. Left TKA. No radiographic evidence of loosening. Trace knee joint effusion. IMPRESSION: Acute displaced subcapital femoral neck fracture of the left femur. Electronically Signed   By: Norman Gatlin M.D.   On: 02/25/2024 23:06   DG Pelvis 1-2 Views Result Date: 02/25/2024 CLINICAL DATA:  Tender to palpation.  Encounter for fracture. EXAM: PELVIS - 1-2 VIEW; LEFT FEMUR 2 VIEWS COMPARISON:  None Available. FINDINGS: Acute displaced fracture of the left subcapital femoral neck. The  femoral head remains seated in the acetabulum. There is slight superior dislocation of the femoral shaft. Left TKA. No radiographic evidence of loosening. Trace knee joint effusion. IMPRESSION: Acute displaced subcapital femoral neck fracture of the left femur. Electronically Signed   By: Norman Gatlin M.D.   On: 02/25/2024 23:06    EKG: Independently reviewed. Sinus rhythm.   Assessment/Plan   1. Left hip fracture  - Based on the available data, Mrs. Lumbert presents an estimated 0.67% risk of perioperative MI or cardiac arrest; she can achieve >METs and no further preoperative cardiac evaluation is indicated  - Continue NPO, pain-control, supportive care    2. Hypertension  - Continue Norvasc  and hydralazine     3. CKD 3A  - Appears close to baseline  - Renally-dose medications   4. Anemia  - Hgb 9.6 on admission, down from 10.8 in early July 2025  - No overt bleeding, will type and screen preoperatively and repeat CBC in am    5. COPD  - Not in exacerbation  - Continue Spiriva  and ICS-LABA, use short-acting bronchodilators as-needed     DVT prophylaxis: SCDs  Code Status: Full  Level  of Care: Level of care: Med-Surg Family Communication: None present  Disposition Plan:  Patient is from: home  Anticipated d/c is to: TBD Anticipated d/c date is: 03/01/24  Patient currently: Pending ortho consult and likely operative hip repair  Consults called: Orthopedic surgery  Admission status: Inpatient     Evalene GORMAN Sprinkles, MD [facility]  02/26/2024, 1:04 AM

## 2024-02-26 NOTE — Discharge Instructions (Addendum)
 INSTRUCTIONS AFTER JOINT REPLACEMENT   Remove items at home which could result in a fall. This includes throw rugs or furniture in walking pathways ICE to the affected joint every three hours while awake for 30 minutes at a time, for at least the first 3-5 days, and then as needed for pain and swelling.  Continue to use ice for pain and swelling. You may notice swelling that will progress down to the foot and ankle.  This is normal after surgery.  Elevate your leg when you are not up walking on it.   Continue to use the breathing machine you got in the hospital (incentive spirometer) which will help keep your temperature down.  It is common for your temperature to cycle up and down following surgery, especially at night when you are not up moving around and exerting yourself.  The breathing machine keeps your lungs expanded and your temperature down.  DIET:  As you were doing prior to hospitalization, we recommend a well-balanced diet.  DRESSING / WOUND CARE / SHOWERING:  Keep the surgical dressing until follow up.  The dressing is water proof, so you can shower without any extra covering.  IF THE DRESSING FALLS OFF or the wound gets wet inside, change the dressing with sterile gauze.  Please use good hand washing techniques before changing the dressing.  Do not use any lotions or creams on the incision until instructed by your surgeon.    ACTIVITY  Increase activity slowly as tolerated, but follow the weight bearing instructions below.   No driving for 6 weeks or until further direction given by your physician.  You cannot drive while taking narcotics.  No lifting or carrying greater than 10 lbs. until further directed by your surgeon. Avoid periods of inactivity such as sitting longer than an hour when not asleep. This helps prevent blood clots.  You may return to work once you are authorized by your doctor.   WEIGHT BEARING: Weight bearing as tolerated with assist device (walker, cane, etc) as  directed, use it as long as suggested by your surgeon or therapist, typically at least 4-6 weeks.  EXERCISES  Results after joint replacement surgery are often greatly improved when you follow the exercise, range of motion and muscle strengthening exercises prescribed by your doctor. Safety measures are also important to protect the joint from further injury. Any time any of these exercises cause you to have increased pain or swelling, decrease what you are doing until you are comfortable again and then slowly increase them. If you have problems or questions, call your caregiver or physical therapist for advice.   Rehabilitation is important following a joint replacement. After just a few days of immobilization, the muscles of the leg can become weakened and shrink (atrophy).  These exercises are designed to build up the tone and strength of the thigh and leg muscles and to improve motion. Often times heat used for twenty to thirty minutes before working out will loosen up your tissues and help with improving the range of motion but do not use heat for the first two weeks following surgery (sometimes heat can increase post-operative swelling).   These exercises can be done on a training (exercise) mat, on the floor, on a table or on a bed. Use whatever works the best and is most comfortable for you.    Use music or television while you are exercising so that the exercises are a pleasant break in your day. This will make your life  better with the exercises acting as a break in your routine that you can look forward to.   Perform all exercises about fifteen times, three times per day or as directed.  You should exercise both the operative leg and the other leg as well.  Exercises include:   Quad Sets - Tighten up the muscle on the front of the thigh (Quad) and hold for 5-10 seconds.   Straight Leg Raises - With your knee straight (if you were given a brace, keep it on), lift the leg to 60 degrees, hold  for 3 seconds, and slowly lower the leg.  Perform this exercise against resistance later as your leg gets stronger.  Leg Slides: Lying on your back, slowly slide your foot toward your buttocks, bending your knee up off the floor (only go as far as is comfortable). Then slowly slide your foot back down until your leg is flat on the floor again.  Angel Wings: Lying on your back spread your legs to the side as far apart as you can without causing discomfort.  Hamstring Strength:  Lying on your back, push your heel against the floor with your leg straight by tightening up the muscles of your buttocks.  Repeat, but this time bend your knee to a comfortable angle, and push your heel against the floor.  You may put a pillow under the heel to make it more comfortable if necessary.   A rehabilitation program following joint replacement surgery can speed recovery and prevent re-injury in the future due to weakened muscles. Contact your doctor or a physical therapist for more information on knee rehabilitation.   CONSTIPATION:  Constipation is defined medically as fewer than three stools per week and severe constipation as less than one stool per week.  Even if you have a regular bowel pattern at home, your normal regimen is likely to be disrupted due to multiple reasons following surgery.  Combination of anesthesia, postoperative narcotics, change in appetite and fluid intake all can affect your bowels.   YOU MUST use at least one of the following options; they are listed in order of increasing strength to get the job done.  They are all available over the counter, and you may need to use some, POSSIBLY even all of these options:    Drink plenty of fluids (prune juice may be helpful) and high fiber foods Colace 100 mg by mouth twice a day  Senokot for constipation as directed and as needed Dulcolax (bisacodyl), take with full glass of water  Miralax  (polyethylene glycol) once or twice a day as needed.  If you  have tried all these things and are unable to have a bowel movement in the first 3-4 days after surgery call either your surgeon or your primary doctor.    If you experience loose stools or diarrhea, hold the medications until you stool forms back up.  If your symptoms do not get better within 1 week or if they get worse, check with your doctor.  If you experience the worst abdominal pain ever or develop nausea or vomiting, please contact the office immediately for further recommendations for treatment.  ITCHING:  If you experience itching with your medications, try taking only a single pain pill, or even half a pain pill at a time.  You can also use Benadryl  over the counter for itching or also to help with sleep.   TED HOSE STOCKINGS:  Use stockings on both legs until for at least 2 weeks or  as directed by physician office. They may be removed at night for sleeping.  MEDICATIONS:  See your medication summary on the "After Visit Summary" that nursing will review with you.  You may have some home medications which will be placed on hold until you complete the course of blood thinner medication.  It is important for you to complete the blood thinner medication as prescribed.  Blood clot prevention (DVT Prophylaxis): After surgery you are at an increased risk for a blood clot.  You were prescribed a blood thinner, Aspirin  81mg , to be taken twice daily for a total of 4 weeks from surgery to help reduce your risk of getting a blood clot.  Signs of a pulmonary embolus (blood clot in the lungs) include sudden short of breath, feeling lightheaded or dizzy, chest pain with a deep breath, rapid pulse rapid breathing.  Signs of a blood clot in your arms or legs include new unexplained swelling and cramping, warm, red or darkened skin around the painful area.  Please call the office or 911 right away if these signs or symptoms develop.  PRECAUTIONS:   If you experience chest pain or shortness of breath - call  911 immediately for transfer to the hospital emergency department.   If you develop a fever greater that 101 F, purulent drainage from wound, increased redness or drainage from wound, foul odor from the wound/dressing, or calf pain - CONTACT YOUR SURGEON.                                                   FOLLOW-UP APPOINTMENTS:  If you do not already have a post-op appointment, please call the office for an appointment to be seen by your surgeon.  Guidelines for how soon to be seen are listed in your "After Visit Summary", but are typically between 2-3 weeks after surgery.  If you have a specialized bandage, you may be told to follow up 1 week after surgery.  POST-OPERATIVE OPIOID TAPER INSTRUCTIONS: It is important to wean off of your opioid medication as soon as possible. If you do not need pain medication after your surgery it is ok to stop day one. Opioids include: Codeine, Hydrocodone (Norco, Vicodin), Oxycodone (Percocet, oxycontin ) and hydromorphone  amongst others.  Long term and even short term use of opiods can cause: Increased pain response Dependence Constipation Depression Respiratory depression And more.  Withdrawal symptoms can include Flu like symptoms Nausea, vomiting And more Techniques to manage these symptoms Hydrate well Eat regular healthy meals Stay active Use relaxation techniques(deep breathing, meditating, yoga) Do Not substitute Alcohol to help with tapering If you have been on opioids for less than two weeks and do not have pain than it is ok to stop all together.  Plan to wean off of opioids This plan should start within one week post op of your joint replacement. Maintain the same interval or time between taking each dose and first decrease the dose.  Cut the total daily intake of opioids by one tablet each day Next start to increase the time between doses. The last dose that should be eliminated is the evening dose.   MAKE SURE YOU:  Understand these  instructions.  Get help right away if you are not doing well or get worse.    Thank you for letting us  be a part of your medical care  team.  It is a privilege we respect greatly.  We hope these instructions will help you stay on track for a fast and full recovery!          In a time of Crisis: [facility]  Mobile Crisis Management provides immediate crisis response, 24/7.  Call [phone]  [facility] for MH/DD/SA [facility] is available 24 hours a day, 7 days a week. Customer Service Specialists will assist you to find a crisis provider that is well-matched with your needs. Your local number is: [phone]  [facility] ([facility]) IOP, individual counseling, medication management [address] [place], [place] [address] [phone] Call for intake hours; Medicaid and Uninsured    Substance Use Outpatient Providers  [facility] ([facility] Group and individual counseling. [address]  [place], [place] [address] [phone] [place]: [phone]  [place]: [phone] Medicaid and uninsured.   [facility] Offers IOP groups multiple times per week. [address] [place], [place] [address] [phone] Takes Medicaid and other insurances.   [facility]  Chemical Dependency Intensive Outpatient Program (IOP) [address] [place], [place] [address] [phone] Takes Nurse, learning disability and [place].   [facility]  IOP and Partial Hospitalization Program  [address]  [place], [place] [address] [phone] Private Insurance, [place] only for partial hospitalization  [facility] [address], [place], [place] [address] [phone] Monday-Friday. Short and Long term options.  [facility] ([facility]) IOP,  individual counseling, medication management [address] [place], [place] [address] [phone] Medicaid and [facility]  [facility] [address]  [place], [place] [address] [phone] Private Insurance and Self Pay   [facility] [address]  [place], [place] [address] [phone] Private Insurance, [place], and Self Pay   [facility]  [address] [place], [place] [address] [facility]  [address]  [place], [place] [address] [phone]  [facility]  [address] [place], [place] [address] [phone]      Residential Treatment Programs  [facility] [address] [place], [place] [address] [phone] or [phone] Detox and Residential Rehab 21 days (Medicaid, private insurance, and self pay. If Medicare, will look into funding). No methadone. Call for pre-screen.   [facility]) [address]  [place], [place] [address] [phone] Detox 3-7 days (self Pay and Medicaid Limited availability). Transitional Program for females needs 60 days clean first.  Rehab Only for Males (Medicare, Medicaid, and Self Pay)-No methadone.  [facility] [address] [place], [place] [address] [phone] or [phone] Private Insurance only  [facility] PHONE: [phone] FAX: [phone] Residential program for women 21 and over for up to a year through a Christian 12-step recovery model. Self-pay.    [facility] [address] [place], [place] [address] Phone:  [phone] Must be detoxed 72 hours prior to admission; 28 day program.  Self-pay.  [facility] [address]  [place], [place] [phone] [employer], Medicare, [place] (not straight [place]). They offer assistance with transportation.   [facility] [address] [place],   [place], [place] [address] [phone] Christian Based Program. Men only. No insurance  [facility] is  a substance use disorder treatment program for women, including those who are pregnant, parenting, and/or whose lives have been touched by abuse and violence. ([phone]) [phone]  [facility] [address] [place], [place] [address] Women's: [phone] Men's: [phone] No Medicaid.   [facility] Locations across the [place] (mainly [place] ) willing to help with transportation.  [phone] [employer]. [facility]  [address] [place]  [place], [place] [address] [phone] Treatment Only, must make assessment appointment, and must be sober for assessment appointment. Self pay, [facility], must be [facility] resident. No methadone.   [facility]  [address] [place], [place] [address] [phone] No pending legal charges, Long-term work program. No methadone. Call for assessment.  [facility]  [address] [place], [place] [address] [phone] or [phone] Commercial Insurance Only  [facility] - [phone] Private Insurance (no [place]). Males/Females, call to make referrals, multiple facilities.   [facility]: [facility] [address] [place], [place] [address] [phone]  [facility]-no methadone: [facility] [address] [place], [place] [address] [phone] [facility] [place] [facility] [phone] [place], [place] For women, houses 8 residents for sober living. No Medicaid.         AA Meetings Website to locate meetings (virtually or in person): [email or web address].young.biz/ Phone: [phone]  Syringe Services Program: Due to COVID-19, syringe services programs are likely operating under  different hours with limited or no fixed site hours. Some programs may not be operating at all. Please contact the program directly using the phone numbers provided below to see if they are still operating under COVID-19. [facility] Solution to the Opioid Problem (GCSTOP) Fixed; mobile; peer-based;Norman Cummins) [phone] [email or web address] Fixed site exchange at [facility], [address] [place], [place] [address] on Wednesdays (2:00 - 5:00 pm) and Thursdays (4:00 - 8:00 pm). Pop-up mobile exchange locations: [employer], [address] [place], [place] [address] on Tuesdays (11:00 am - 1:00 pm) and Fridays (11:00 am - 1:00 pm) -[facility] - [address], [place], [place] [address] on Tuesdays (2:00 - 4:00 pm) and Fridays (2:00 - 4:00 pm) -[employer] copy - also serves Radio broadcast assistant and [employer];Fixed; mobile; peer-based; Velia Specking [phone] [email or web address] [address], [place], [place] [address] Delivery and outreach available in [place] and [place], please call for more information. Monday, Tuesday: 1:00 -7:00 pm, Thursday: 4:00 pm - 8:00 pm, Friday: 1:00 pm - 8:00 pm)  Medication-Assisted Treatment (MAT):  -[facility]- services [address] and surrounding areas including [place], [place], [place], [place], [address], [place], [place], [place], [place], and [place], [place]. Options include Methadone, buprenorphine or Suboxone. [address] [place], [place] [address] Phone: [phone] Mon - Fri: 5:30am - 2:00pm Sat: 5:30am -7:30am Sun: Closed Holidays: 6:00am - 8:00am  -[facility]- We use FDA-approved medications, like methadone/suboxone/sublocade, and vivitrol. These medications are then combined with customized care plans that include individual or group counseling, toxicology, and medical care directed by on-site  physicians. Accepts most insurance plans, Medicaid, and private pay.  [address] [place], [place] [address] Phone: [phone] Monday-Friday 5:00 AM - 10:00 AM Saturday 6:00 AM - 8:30 AM Sunday 6:00 AM - 7:00 AM  -[facility]- [facility] is a treatment & recovery focused program. In addition to  receiving methadone medication, our clients participate in individual and group counseling as well as random drug testing. If accepted into the [facility] Opioid Program, you will be provided several intake appointments and a physical exam [address] [place], [place] [address] Office: [phone]  Fax: [phone]  -[facility]- We put our community members at the center of everything we do, for remote treatment services as well as in-person, from alcohol withdrawal to opioid use and more.  [address], [place], [place] [address] [phone] Monday-Wednesday: 9:00am - 5:00pm Thursday: 9:00am - 6:00pm Friday: 9:00am - 5:00pm Saturday: 9:00am - 1:00pm Sunday: Closed  -[facility] [place]) [address], [place], [place] [address] [phone]  [place] [phone] [address] [place], [place] [address]  M-W    5:00am-12:00pm Thu     5:00am-10:00am Fri       5:00am-12:00pm Sat      5:00am-8:00am Sun     Closed  $12/daily for Methadone Treatment.      Nutrition Post Hospital Stay Proper nutrition can help your body recover from illness and injury.   Foods and beverages high in protein, vitamins, and minerals help rebuild muscle loss, promote healing, & reduce fall risk.   In addition to eating healthy foods, a nutrition shake is an easy, delicious way to get the nutrition you need during and after your hospital stay  It is recommended that you continue to drink 2 bottles per day of:       Ensure for at least 1 month (30 days) after your hospital stay   Tips for adding a nutrition shake into your  routine: As allowed, drink one with vitamins or medications instead of water or juice Enjoy one as a tasty mid-morning or afternoon snack Drink cold or make a milkshake out of it Drink one instead of milk with cereal or snacks Use as a coffee creamer   Available at the following grocery stores and pharmacies:           * [place] [facility] * [employer] * Costco  * Rite Aid          * Walmart * Sam's Club  * Walgreens      * Target  * BJ's   * CVS  * [employer]   * [facility] [phone]            For COUPONS visit: www.ensure.com/join or RoleLink.com.br   Suggested Substitutions Ensure Plus = Boost Plus = Carnation Breakfast Essentials = Boost Compact Ensure Active Clear = Boost Breeze Glucerna Shake = Boost Glucose Control = Carnation Breakfast Essentials SUGAR FREE  High-Calorie, High-Protein Nutrition Therapy (2021) A high-calorie, high-protein diet has been recommended to you. Your registered dietitian nutritionist (RDN) may have recommended this diet because you are having difficulty eating enough calories throughout the day, you have lost weight, and/or you need to add protein to your diet. Sometimes you may not feel like eating, even if you know the importance of good nutrition. The recommendations in this handout can help you with the following: Regaining your strength and energy Keeping your body healthy Healing and recovering from surgery or illness and fighting infection Tips: Schedule Your Meals and Snacks Several small meals and snacks are often better tolerated and digested than large meals. Strategies Plan to eat 3 meals and 3 snacks daily. Experiment with timing meals to find out when you have a larger appetite. Appetite may be greatest in the morning  after not eating all night so you may prefer to eat your larger meals and snacks in the morning and at lunch. Breakfast-type foods are often better tolerated so eat foods such as eggs,  pancakes, waffles and cereal for any meal or snack. Carry snacks with you so you are prepared to eat every 2 to 3 hours. Determine what works best for you if your body's cues for feeling hungry or full are not working. Eat a small meal or snack even if you don't feel hungry. Set a timer to remind you when it is time to eat. Take a walk before you eat (with health care provider's approval). Light or moderate physical activity can help you maintain muscle and increase your appetite. Make Eating Enjoyable Taking steps to make the experience enjoyable may help to increase your interest in eating and improve your appetite. Strategies: Eat with others whenever possible. Include your favorite foods to make meals more enjoyable. Try new foods. Save your beverage for the end of the meal so that you have more room for food before you get full. Add Calories to Your Meals and Snacks Try adding calorie-dense foods so that each bite provides more nutrition. Strategies Drink milk, chocolate milk, soy milk, or smoothies instead of low-calorie beverages such as diet drinks or water. Cook with milk or soy milk instead of water when making dishes such as hot cereal, cocoa, or pudding. Add jelly, jam, honey, butter or margarine to bread and crackers. Add jam or fruit to ice cream and as a topping over cake. Mix dried fruit, nuts, granola, honey, or dry cereal with yogurt or hot cereals. Enjoy snacks such as milkshakes, smoothies, pudding, ice cream, or custard. Blend a fruit smoothie of a banana, frozen berries, milk or soy milk, and 1 tablespoon nonfat powdered milk or protein powder. Add Protein to Your Meals and Snacks Choose at least one protein food at each meal and snack to increase your daily intake. Strategies Add  cup nonfat dry milk powder or protein powder to make a high-protein milk to drink or to use in recipes that call for milk. Vanilla or peppermint extract or unsweetened cocoa powder could  help to boost the flavor. Add hard-cooked eggs, leftover meat, grated cheese, canned beans or tofu to noodles, rice, salads, sandwiches, soups, casseroles, pasta, tuna and other mixed dishes. Add powdered milk or protein powder to hot cereals, meatloaf, casseroles, scrambled eggs, sauces, cream soups, and shakes. Add beans and lentils to salads, soups, casseroles, and vegetable dishes. Eat cottage cheese or yogurt, especially Greek yogurt, with fruit as a snack or dessert. Eat peanut or other nut butters on crackers, bread, toast, waffles, apples, bananas or celery sticks. Add it to milkshakes, smoothies, or desserts. Consider a ready-made protein shake. Your RDN will make recommendations. Add Fats to Your Meals and Snacks Try adding fats to your meals and snacks. Fat provides more calories in fewer bites than carbohydrate or protein and adds flavors to your foods. Strategies Snack on nuts and seeds or add them to foods like salads, pasta, cereals, yogurt, and ice cream.  Saut or stir-fry vegetables, meats, chicken, fish or tofu in olive or canola oil.  Add olive oil, other vegetable oils, butter or margarine to soups, vegetables, potatoes, cooked cereal, rice, pasta, bread, crackers, pancakes, or waffles. Snack on olives or add to pasta, pizza, or salad. Add avocado or guacamole to your salads, sandwiches, and other entrees. Include fatty fish such as salmon in your weekly meal  plan. For general food safety tips, especially for clients with immunocompromised conditions, ask your RDN for the Food Safety Nutrition Therapy handout. Small Meal and Snack Ideas These snacks and meals are recommended when you have to eat but aren't necessarily hungry.  They are good choices because they are high in protein and high in calories.  2 graham crackers 2 tablespoons peanut or other nut butter 1 cup milk 2 slices whole wheat toast topped with:  avocado, mashed Seasoning of your choice   cup Greek  yogurt  cup fruit  cup granola 2 deviled egg halves 5 whole wheat crackers  1 cup cream of tomato soup  grilled cheese sandwich 1 toasted waffle topped with: 2 tablespoons peanut or nut butter 1 tablespoon jam  Trail mix made with:  cup nuts  cup dried fruit  cup cold cereal, any variety  cup oatmeal or cream of wheat cereal 1 tablespoon peanut or nut butter  cup diced fruit   High-Calorie, High-Protein Sample 1-Day Menu View Nutrient Info Breakfast 1 egg, scrambled 1 ounce cheddar cheese 1 English muffin, whole wheat 1 tablespoon margarine 1 tablespoon jam  cup orange juice, fortified with calcium  and vitamin D   Morning Snack 1 tablespoon peanut butter 1 banana 1 cup 1% milk  Lunch Tuna salad sandwich made with: 2 slices bread, whole wheat 3 ounces tuna mixed with: 1 tablespoon mayonnaise  cup pudding  Afternoon Snack  cup hummus  cup carrots 1 pita  Evening Meal Enchilada casserole made with: 2 corn tortillas 3 ounces ground beef, cooked  cup black beans, cooked  cup corn, cooked 1 ounce grated cheddar cheese  cup enchilada sauce  avocado, sliced, topping for enchilada 1 tablespoon sour cream, topping for enchilada Salad:  cup lettuce, shredded  cup tomatoes, chopped, for salad 1 tablespoon olive oil and vinegar dressing, for salad  Evening Snack  cup Greek yogurt  cup blueberries  cup granola

## 2024-02-26 NOTE — Anesthesia Preprocedure Evaluation (Addendum)
 Anesthesia Evaluation  Patient identified by MRN, date of birth, ID band Patient awake    Reviewed: Allergy & Precautions, NPO status , Patient's Chart, lab work & pertinent test results  Airway Mallampati: II  TM Distance: >3 FB Neck ROM: Full    Dental no notable dental hx. (+) Poor Dentition, Dental Advisory Given   Pulmonary shortness of breath, COPD,  COPD inhaler, Current Smoker and Patient abstained from smoking.   Pulmonary exam normal breath sounds clear to auscultation       Cardiovascular hypertension, Pt. on medications Normal cardiovascular exam Rhythm:Regular Rate:Normal     Neuro/Psych  PSYCHIATRIC DISORDERS Anxiety Depression    negative neurological ROS     GI/Hepatic ,GERD  Controlled,,(+)     substance abuse  marijuana use  Endo/Other  hyperparathyroidism  Renal/GU Renal InsufficiencyRenal disease (cr 1.07)Lab Results      Component                Value               Date                      NA                       138                 06/23/2023                CL                       108                 06/23/2023                K                        3.8                 06/23/2023                CO2                      23                  06/23/2023                BUN                      15                  06/23/2023                CREATININE               0.79                06/23/2023                GFRNONAA                 >60                 06/23/2023                CALCIUM   10.5 (H)            06/23/2023                        negative genitourinary   Musculoskeletal  (+) Arthritis , Osteoarthritis,    Abdominal   Peds  Hematology  (+) Blood dyscrasia, anemia Hb 9.7, plt 213   Anesthesia Other Findings All to Chantix and PCNs  Reproductive/Obstetrics negative OB ROS                              Anesthesia Physical Anesthesia  Plan  ASA: 3  Anesthesia Plan: General   Post-op Pain Management: Tylenol  PO (pre-op)*   Induction: Intravenous  PONV Risk Score and Plan: 2 and Ondansetron , Midazolam  and Treatment may vary due to age or medical condition  Airway Management Planned: Oral ETT  Additional Equipment: None  Intra-op Plan:   Post-operative Plan: Extubation in OR  Informed Consent: I have reviewed the patients History and Physical, chart, labs and discussed the procedure including the risks, benefits and alternatives for the proposed anesthesia with the patient or authorized representative who has indicated his/her understanding and acceptance.     Dental advisory given  Plan Discussed with: CRNA and Anesthesiologist  Anesthesia Plan Comments:         Anesthesia Quick Evaluation

## 2024-02-26 NOTE — Hospital Course (Addendum)
 68 yof w/ HTN HLD,CKD 3A, COPD, and alcohol abuse in remission presented with severe left hip pain after a trip and fal when her shoe and fell onto her left side.  Prior to the fall denies any heart disease, does not experience chest discomfort with activity, and has been able to ascend 2 flights of stairs. In ZI:jqzampoz and saturating mid 90s on room air with normal HR and stable BP.  Labs are most notable for creatinine 1.07, calcium  11.1, normal WBC, and hemoglobin 9.6. X-ray showed left femoral neck fracture chest x-ray no acute finding EKG sinus rhythm.  Orthopedic consulted and admitted S/P left THA 8/22 postop doing well although drifting hemoglobin working with PT OT and planning for SNF. Patient is eager to go to rehab today  Subjective: Seen and examined Pleasant, no complaint Resting comfortably pain controlled no nausea vomiting fever chills no weakness  Discharge diagnosis  Left femoral neck fracture secondary to fall: S/P Lt THA 8/22.  Continue PT OT pain management DVT prophylaxis -currently on Lovenox , Tylenol  and opiates for pain management  Defer to orthopedics for DVT prophylaxis and pain management Vitamin D  normal   Hypertension  BP controlled continue Norvasc  and hydralazine      CKD 3A  Renal function at baseline, monitor  ABLA Periopertive Normocytic anemia:  Previously 10.8 in early July 2025, no signs of bleeding -suspect ABLA with drop in hemoglobin>  hb holding mid 7 gm, patient asymptomatic without weakness , advised she will need close follow-up to repeat hemoglobin if less than 7 or symptomatic she will need transfusion and she is agreeable, she feels  ready to go to rehab.  Continue B12 replacement continue iron supplementation  Recent Labs  Lab 02/27/24 0720 02/28/24 0639 02/29/24 0627 02/29/24 1602 03/01/24 0557  HGB 7.9* 7.8* 7.5* 7.9* 7.4*  HCT 23.8* 23.6* 23.1* 24.7* 22.1*    COPD:  Not in exacerbation, optimize meds continue Spiriva  and  ICS-LABA,nebs prn  Severe malnutrition with Body mass index is 18.54 kg/m.: Augment diet ID input appreciated   Mobility: PT Orders: Active PT Follow up Rec: Skilled Nursing-Short Term Rehab (<3 Hours/Day)02/27/2024 1300    DVT prophylaxis: enoxaparin  (LOVENOX ) injection 40 mg Start: 02/27/24 1200 SCDs Start: 02/26/24 0103 Code Status:   Code Status: Full Code Family Communication: plan of care discussed with patient at bedside. Patient status is: Remains hospitalized because of severity of illness Level of care: Med-Surg   Dispo: The patient is from: home with sister            Anticipated disposition: SNF today  Objective: Vitals last 24 hrs: Vitals:   02/29/24 1632 02/29/24 2018 03/01/24 0522 03/01/24 0806  BP: (!) 125/91 136/71 (!) 113/56   Pulse: 88 82 67   Resp: 16 16 14    Temp: 99.2 F (37.3 C) 98.9 F (37.2 C) 98.1 F (36.7 C)   TempSrc: Oral Oral Oral   SpO2: 100% 99% 100% 98%  Weight:      Height:        Physical Examination: General exam: AAOX3.Pleasant HEENT:Oral mucosa moist, Ear/Nose WNL grossly Respiratory system: Bilaterally clear BS,no use of accessory muscle Cardiovascular system: S1 & S2 +, No JVD. Gastrointestinal system: Abdomen soft,NT,ND, BS+ Nervous System: Alert, awake, moving all extremities,and following commands. Extremities: Left hip surgical site with Aquacel dressing c/di  Skin: No rashes,no icterus. MSK: Normal muscle bulk,tone, power

## 2024-02-26 NOTE — Progress Notes (Signed)
 Initial Nutrition Assessment  DOCUMENTATION CODES:   Severe malnutrition in context of chronic illness (COPD, hx of alcohol abuse)  INTERVENTION:  When pt returns from surgery and ordered diet, recommend: Liberalized regular diet to provide increased options for pt and promote adequate intake that meets increased calorie and protein needs for post op healing  Ensure Plus High Protein po BID, each supplement provides 350 kcal and 20 grams of protein.  Magic cup TID with meals (pt prefers berry), each supplement provides 290 kcal and 9 grams of protein  Added nutrition discharge instructions and High Calorie, High Protein Nutrition Therapy handout to AVS  NUTRITION DIAGNOSIS:   Severe Malnutrition related to chronic illness (COPD, hx of alcohol abuse) as evidenced by severe muscle depletion, severe fat depletion.  GOAL:   Patient will meet greater than or equal to 90% of their needs  MONITOR:   Supplement acceptance, PO intake  REASON FOR ASSESSMENT:   Consult Assessment of nutrition requirement/status, Hip fracture protocol  ASSESSMENT:   Pt with hx HTN, HLD, acid reflux, COPD, and past alcohol abuse. 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 Admitted after tripping and falling in which she landed on L hip, diagnosed L femoral neck fx.  Plan for L total hip arthroplasty today 8/22. Spoke with pt who was awake and alert. Pt stated she was in a lot of pain, will be heading to surgery today. Pt has been NPO since admission with impending surgery planned. Discussed importance of adequate nutrition intake to help with post op recovery and encouraged high protein intake.  PTA, pt reports eating 3x per day.  Breakfast: eggs, grits, sausage/bacon Lunch: chicken, rice, vegetables Dinner: some type of meat, a side, and vegetables  Pt reports she had been eating well up until her fall. She reports lactose intolerance and avoiding most dairy; however she stated she drinks Ensure  shakes at home and can tolerate those. Pt reports no GI discomforts or barriers to nutrition intake.    Wt has been stable for the last year, but pt reports she used to weigh 145# 2 years ago then lost significant wt in 1 year down to 100# but has since regained back to 115#. Physical exam shows severe muscle and severe fat depletions, suspect depletions occurred during significant wt loss but malnutrition has continued to be ongoing problem.  Pt reports using cane at baseline PTA.  Medications reviewed and include:  Zofran  Protonix  Zoloft  NaCl with Kcl 20 mEq  Labs reviewed:  Sodium 134 Vitamin D  53.4 (WNL) Vitamin B12 219 (WNL)  NUTRITION - FOCUSED PHYSICAL EXAM:  Flowsheet Row Most Recent Value  Orbital Region Moderate depletion  Upper Arm Region Severe depletion  Thoracic and Lumbar Region Severe depletion  Buccal Region Moderate depletion  Temple Region Severe depletion  Clavicle Bone Region Severe depletion  Clavicle and Acromion Bone Region Severe depletion  Scapular Bone Region Severe depletion  Dorsal Hand Severe depletion  Patellar Region Moderate depletion  [only assessed R leg]  Anterior Thigh Region Moderate depletion  [only assessed R leg]  Posterior Calf Region Moderate depletion  [only assessed R leg]  Edema (RD Assessment) Mild  [lower extremities]  Hair Reviewed  Eyes Reviewed  Mouth Reviewed  Skin Reviewed  Nails Reviewed    Diet Order:   Diet Order             Diet NPO time specified Except for: Sips with Meds, Ice Chips  Diet effective midnight  EDUCATION NEEDS:   Education needs have been addressed  Skin:  Skin Assessment: Reviewed RN Assessment  Last BM:  8/21  Height:   Ht Readings from Last 1 Encounters:  02/26/24 5' 6 (1.676 m)    Weight:   Wt Readings from Last 1 Encounters:  02/26/24 52.1 kg    Ideal Body Weight:  59.1 kg  BMI:  Body mass index is 18.54 kg/m.  Estimated Nutritional Needs:    Kcal:  1500-1700  Protein:  65-85g  Fluid:  1.5-1.7L    Josette Glance, MS, RDN, LDN Clinical Dietitian I Please reach out via secure chat

## 2024-02-26 NOTE — Anesthesia Procedure Notes (Signed)
 Procedure Name: Intubation Date/Time: 02/26/2024 1:04 PM  Performed by: Mollie Olivia SAUNDERS, CRNAPre-anesthesia Checklist: Patient identified, Emergency Drugs available, Suction available and Patient being monitored Patient Re-evaluated:Patient Re-evaluated prior to induction Oxygen Delivery Method: Circle system utilized Preoxygenation: Pre-oxygenation with 100% oxygen Induction Type: IV induction Ventilation: Mask ventilation without difficulty Laryngoscope Size: Glidescope and 3 Tube type: Oral Tube size: 7.0 mm Number of attempts: 1 Airway Equipment and Method: Rigid stylet and Video-laryngoscopy Placement Confirmation: ETT inserted through vocal cords under direct vision, positive ETCO2 and breath sounds checked- equal and bilateral Secured at: 22 cm Tube secured with: Tape Dental Injury: Teeth and Oropharynx as per pre-operative assessment  Difficulty Due To: Difficulty was anticipated, Difficult Airway- due to dentition, Difficult Airway- due to limited oral opening and Difficult Airway- due to reduced neck mobility Future Recommendations: Recommend- induction with short-acting agent, and alternative techniques readily available

## 2024-02-26 NOTE — Op Note (Signed)
 02/25/2024 - 02/26/2024  2:46 PM  PATIENT:  Anna Mcguire   MRN: [id number]  PRE-OPERATIVE DIAGNOSIS: Left displaced femoral neck fracture  POST-OPERATIVE DIAGNOSIS:  same  PROCEDURE: Left cemented total hip arthroplasty PREOPERATIVE INDICATIONS:    Rex Magee is an 68 y.o. female who has a diagnosis of left displaced femoral neck fracture and elected for surgical management after failing conservative treatment.  The risks benefits and alternatives were discussed with the patient including but not limited to the risks of nonoperative treatment, versus surgical intervention including infection, bleeding, nerve injury, periprosthetic fracture, the need for revision surgery, dislocation, leg length discrepancy, blood clots, cardiopulmonary complications, morbidity, mortality, among others, and they were willing to proceed.     OPERATIVE REPORT     SURGEON:  Toribio Higashi, MD    ASSISTANT: Bernarda Mclean, PA-C, (Present throughout the entire procedure,  necessary for completion of procedure in a timely manner, assisting with retraction, instrumentation, and closure)     ANESTHESIA: General  ESTIMATED BLOOD LOSS: 200cc    COMPLICATIONS:  None.     COMPONENTS:   Stryker 52 mm acetabular shell, 6.5 peg screws x 2, Accolade C size 4 stem with high offset, 36+0 mm ceramic head Implant Name Type Inv. Item Serial No. Manufacturer Lot No. LRB No. Used Action  CEMENT BONE SIMPLEX SPEEDSET - [id number] Cement CEMENT BONE SIMPLEX SPEEDSET  [employer] [id number] Left 1 Implanted  CEMENT BONE SIMPLEX SPEEDSET - [id number] Cement CEMENT BONE SIMPLEX SPEEDSET  [employer] [id number] Left 1 Implanted  CEMENT RESTRICTOR BONE PREP ST - [id number] Cement CEMENT RESTRICTOR BONE PREP ST  [employer] [id number] Left 1 Implanted  SHELL CLUSTERHOLE ACETABULAR 5 - [id number] Shell SHELL CLUSTERHOLE ACETABULAR 5  [employer] [id number] Left 1 Implanted  SCREW HEX  LP 6.5X25 - [id number] Screw SCREW HEX LP 6.5X25  [employer] [id number] Left 1 Implanted  INSERT TRIDENT POLY 36 0DEG - [id number] Insert INSERT TRIDENT POLY 36 0DEG  [employer] [id number] Left 1 Implanted  SCREW HEX LP 6.5X15 - [id number] Screw SCREW HEX LP 6.5X15  [employer] [id number] Left 1 Implanted  STEM HIP ACCOLADE SZ4 35X137 - [id number] Stem STEM HIP ACCOLADE SZ4 [id number]  [employer] [id number] Left 1 Implanted  HEAD CERAMIC FEMORAL - [id number] Head HEAD CERAMIC FEMORAL  [employer] [id number] Left 1 Implanted       PROCEDURE IN DETAIL:   The patient was met in the holding area and  identified.  The appropriate hip was identified and marked at the operative site.  The patient was then transported to the OR  and  placed under anesthesia.  At that point, the patient was  placed in the lateral decubitus position with the operative side up and  secured to the operating room table  and all bony prominences padded. A subaxillary role was also placed.    The operative lower extremity was prepped from the iliac crest to the distal leg.  Sterile draping was performed.  Preoperative antibiotics, 2 gm of ancef ,1 gm of Tranexamic Acid , and 8 mg of Decadron  administered. Time out was performed prior to incision.      A routine posterolateral approach was utilized via sharp dissection  carried down to the subcutaneous tissue.  Gross bleeders were Bovie coagulated.  The iliotibial band was identified and incised along the length of the skin incision through the glute max fascia.  Charnley retractor was placed with care to protect the sciatic nerve posteriorly.  With the  hip internally rotated, the piriformis tendon was identified and released from the femoral insertion and tagged with a #5 Ethibond.  A capsulotomy was then performed off the femoral insertion and also tagged with a #5 Ethibond.    The femoral neck was exposed, and I resected the femoral neck based on  preoperative templating relative to the lesser trochanter.    I then exposed the deep acetabulum, cleared out any tissue including the ligamentum teres.  After adequate visualization, I excised the labrum.  I then started reaming with a 48 mm reamer, first medializing to the floor of the cotyloid fossa, and then in the position of the cup aiming towards the greater sciatic notch, matching the version of the transverse acetabular ligament and tucked under the anterior wall. I reamed up to 52 mm reamer with good bony bed preparation and a 52 mm cup was chosen.  The real cup was then impacted into place.  Appropriate version and inclination was confirmed clinically matching their bony anatomy, and also with the use of the jig.  I placed 2 screws in the posterior superior quadrant to augment fixation.  A neutral liner was placed and impacted. It was confirmed to be appropriately seated and the acetabular retractors were removed.    I then prepared the proximal femur using the box cutter, Charnley awl, and then sequentially broached starting with 2 up to a size 4.  A trial broach, neck, and head was utilized, and I reduced the hip and it was found to have excellent stability.  There was no impingement with full extension and 90 degrees external rotation.  The hip was stable at the position of sleep and with 90 degrees flexion and 90 degrees of internal rotation.  Leg lengths were also clinically assessed in the lateral position and felt to be equal.   A final femoral prosthesis size 4 was selected.   We then prepared canal for cementation.  The cement restrictor was measured and inserted distally.  The canal was then irrigated with the pulse lavage and 3 L of normal saline.  2 bags of Simplex cement were prepared.  Using the cement gun the cement was inserted distally and the canal was filled.  We then pressurized the canal. The real implant was then inserted matching the patient's native anteversion of  approximately 25 degrees.  We then waited for 13 minutes for the cement to be fully set.  Excess cement was removed.  A lap was placed in the acetabulum prior to cementing was also removed and the acetabulum was assessed to make sure there was no cement or bone fragments.    I again trialed and selected a 36+ 0mm ball. The hip was then reduced and taken through a range of motion. There was no impingement with full extension and 90 degrees external rotation.  The hip was stable at the position of sleep and with 90 degrees flexion and 90 degrees of internal rotation. Leg lengths were  again assessed and felt to be restored.  We then opened, and I impacted the real head ball into place.  The posterior capsule was then closed with #2 Ethibond.  The piriformis was repaired through the base of the abductor tendon using a [place] suture passer.  I then irrigated the hip copiously with dilute Betadine  and with normal saline pulse lavage. Periarticular injection was then performed with Exparel .   We repaired the fascia #1 barbed suture, followed by 0 barbed suture for the subcutaneous fat.  Skin was closed with 2-0 Vicryl and 3-0 Monocryl.  Dermabond and Aquacel dressing were applied. The patient was then awakened and returned to PACU in stable and satisfactory condition.  Leg lengths in the supine position were assessed and felt to be clinically equal. There were no complications.  Post op recs: WB: WBAT LLE, No formal hip precautions Abx: ancef  Imaging: PACU pelvis Xray Dressing: Aquacell, keep intact until follow up DVT prophylaxis: Aspirin  81BID starting POD1 Follow up: 2 weeks after surgery for a wound check with Dr. Edna at [facility].  Address: [address], [place], [place] [address]  Office Phone: [phone]   Toribio Edna, MD Orthopedic Surgeon

## 2024-02-26 NOTE — Transfer of Care (Signed)
 Immediate Anesthesia Transfer of Care Note  Patient: Anna Mcguire  Procedure(s) Performed: TOTAL HIP ARTHROPLASTY, POSTERIOR APPROACH (Left: Hip)  Patient Location: PACU  Anesthesia Type:General  Level of Consciousness: awake, alert , and oriented  Airway & Oxygen Therapy: Patient Spontanous Breathing and Patient connected to face mask oxygen  Post-op Assessment: Report given to RN and Post -op Vital signs reviewed and stable  Post vital signs: Reviewed and stable  Last Vitals:  Vitals Value Taken Time  BP 137/65 02/26/24 15:30  Temp 36.5 C 02/26/24 15:26  Pulse 85 02/26/24 15:31  Resp 13 02/26/24 15:31  SpO2 93 % 02/26/24 15:31  Vitals shown include unfiled device data.  Last Pain:  Vitals:   02/26/24 1158  TempSrc:   PainSc: 8          Complications: No notable events documented.

## 2024-02-26 NOTE — TOC CAGE-AID Note (Signed)
 Transition of Care [facility]) - CAGE-AID Screening   Patient Details  Name: Anna Mcguire MRN: [id number] Date of Birth: 19-Oct-1955  Transition of [facility]) CM/SW Contact:    Griffon Herberg E Willistine Ferrall, LCSW Phone Number: 02/26/2024, 9:39 AM   Clinical Narrative: Patient reports occasional Marijuana use.  Patient reports drinking one 40 oz per day, states she is trying to quit drinking. She is interested in SA resources - Resources added to the AVS.   CAGE-AID Screening:    Have You Ever Felt You Ought to Cut Down on Your Drinking or Drug Use?: Yes Have People Annoyed You By [employer] Your Drinking Or Drug Use?: No Have You Felt Bad Or Guilty About Your Drinking Or Drug Use?: No Have You Ever Had a Drink or Used Drugs First Thing In The Morning to Steady Your Nerves or to Get Rid of a Hangover?: No CAGE-AID Score: 1  Substance Abuse Education Offered: Yes

## 2024-02-26 NOTE — Consult Note (Signed)
 ORTHOPAEDIC CONSULTATION  REQUESTING PHYSICIAN: Christobal Guadalajara, MD  Chief Complaint: left hip fx  HPI: Anna Mcguire is a 68 y.o. female who tripped over her granddaughter's shoes in the dark last night resulting in a fall.  She was unable to ambulate.  She has pain in the left hip.  She was brought in by EMS and found to have a left femoral neck fracture.  She denies pain in other joints or extremities.  Past Medical History:  Diagnosis Date   Acid reflux    Anxiety    Arthritis    COPD (chronic obstructive pulmonary disease) (HCC)    Depression    Dyspnea    Hepatitis    Hypertension    Past Surgical History:  Procedure Laterality Date   COLONOSCOPY WITH PROPOFOL  N/A 06/07/2014   Procedure: COLONOSCOPY WITH PROPOFOL ;  Surgeon: Elsie Cree, MD;  Location: [facility];  Service: Endoscopy;  Laterality: N/A;   ESOPHAGOGASTRODUODENOSCOPY (EGD) WITH PROPOFOL  N/A 06/07/2014   Procedure: ESOPHAGOGASTRODUODENOSCOPY (EGD) WITH PROPOFOL ;  Surgeon: Elsie Cree, MD;  Location: [facility];  Service: Endoscopy;  Laterality: N/A;   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   Procedure: TOTAL KNEE ARTHROPLASTY;  Surgeon: Edna Toribio LABOR, MD;  Location: [facility];  Service: Orthopedics;  Laterality: Left;   Social History   Socioeconomic History   Marital status: Single    Spouse name: Not on file   Number of children: Not on file   Years of education: Not on file   Highest education level: Not on file  Occupational History   Not on file  Tobacco Use   Smoking status: Every Day    Current packs/day: 0.25    Types: Cigarettes   Smokeless tobacco: Never   Tobacco comments:    3 times a week - 10/24/2020    Buproprion helps  Vaping Use   Vaping status: Never Used  Substance and Sexual Activity   Alcohol use: Yes    Comment: occasionally   Drug use: Yes    Types: Marijuana   Sexual activity: Not on file  Other Topics Concern   Not on file  Social History Narrative    Retired   Chief Executive Officer Drivers of Corporate investment banker Strain: Low Risk  (12/15/2023)   Overall Financial Resource Strain (CARDIA)    Difficulty of Paying Living Expenses: Not hard at all  Food Insecurity: Food Insecurity Present (02/26/2024)   Hunger Vital Sign    Worried About Running Out of Food in the Last Year: Sometimes true    Ran Out of Food in the Last Year: Sometimes true  Transportation Needs: No Transportation Needs (02/26/2024)   PRAPARE - Administrator, Civil Service (Medical): No    Lack of Transportation (Non-Medical): No  Physical Activity: Inactive (12/15/2023)   Exercise Vital Sign    Days of Exercise per Week: 0 days    Minutes of Exercise per Session: 0 min  Stress: No Stress Concern Present (12/15/2023)   [employer] of Occupational Health - Occupational Stress Questionnaire    Feeling of Stress : Not at all  Social Connections: Socially Isolated (02/26/2024)   Social Connection and Isolation Panel    Frequency of Communication with Friends and Family: More than three times a week    Frequency of Social Gatherings with Friends and Family: More than three times a week    Attends Religious Services: Never    Database administrator or Organizations: No    Attends  Banker Meetings: Never    Marital Status: Never married   Family History  Problem Relation Age of Onset   Kidney failure Mother    Aneurysm Father    Allergies  Allergen Reactions   Penicillins Hives, Diarrhea and Itching   Chantix [Varenicline Tartrate] Other (See Comments)    Diarrhea and dizziness     Positive ROS: All other systems have been reviewed and were otherwise negative with the exception of those mentioned in the HPI and as above.  Physical Exam: General: Alert, no acute distress Cardiovascular: No pedal edema Respiratory: No cyanosis, no use of accessory musculature Skin: No lesions in the area of chief complaint Neurologic: Sensation intact  distally Psychiatric: Patient is competent for consent with normal mood and affect  MUSCULOSKELETAL:  LLE No traumatic wounds, ecchymosis, or rash  Nontender  No knee or ankle effusion  Sens DPN, SPN, TN intact  Motor EHL, ext, flex 5/5  DP 2+, PT 2+, No significant edema   IMAGING: X-rays reviewed demonstrate displaced left femoral neck fracture  Assessment: Principal Problem:   Closed left hip fracture, initial encounter (HCC) Active Problems:   Hypertension   Chronic obstructive pulmonary disease (HCC)   Stage 3b chronic kidney disease (HCC)   Normocytic anemia   Left femoral neck fracture  Plan: Discussed with patient fortunately has sustained a displaced left femoral neck fracture would benefit from total hip arthroplasty.  Pending medical optimization will plan for surgery this afternoon.  Labs so far are reassuring.  The risks benefits and alternatives were discussed with the patient including but not limited to the risks of nonoperative treatment, versus surgical intervention including infection, bleeding, nerve injury, periprosthetic fracture, the need for revision surgery, dislocation, leg length discrepancy, blood clots, cardiopulmonary complications, morbidity, mortality, among others, and they were willing to proceed.        TORIBIO DELENA HIGASHI, MD  Contact information:   Tzzxijbd 7am-5pm epic message Dr. HIGASHI, or call office for patient follow up: [phone] After hours and holidays please check [email or web address] for group call information for [facility]

## 2024-02-26 NOTE — Progress Notes (Signed)
 PROGRESS NOTE Anna Mcguire  FMW:[id number] DOB: January 16, 1956 DOA: 02/25/2024 PCP: Vicci Barnie NOVAK, MD  Brief Narrative/Hospital Course: 32 yof w/ HTN HLD,CKD 3A, COPD, and alcohol abuse in remission presented with severe left hip pain after a trip and fal when her shoe and fell onto her left side.  Prior to the fall denies any heart disease, does not experience chest discomfort with activity, and has been able to ascend 2 flights of stairs. In ZI:jqzampoz and saturating mid 90s on room air with normal HR and stable BP.  Labs are most notable for creatinine 1.07, calcium  11.1, normal WBC, and hemoglobin 9.6. X-ray showed left femoral neck fracture chest x-ray no acute finding EKG sinus rhythm.  Orthopedic consulted and admitted  Subjective: Seen and examined today Pleasant lady comfortable no chest pain nausea vomiting fever chills Pain in the left hip Overnight afebrile BP slightly on higher side, on room air Labs reviewed with stable chronic anemia hemoglobin 9.7.   Assessment and plan:  Left femoral neck fracture secondary to fall: Orthopedic has been consulted, planning for THA this afternoon per surgery. Preop eval has been done on admission-it appears patient prior to fall  w/ fair/good MET, no known CAD CVA diabetes,chf and appears moderate risk Check vitamin D , B12.  Hypertension  BP fairly controlled continue Norvasc  and hydralazine      CKD 3A  Renal function at baseline, monitor  Normocytic anemia:  Previously 10.8 in early July 2025, no signs of bleeding will watch for perioperative blood loss anemia  COPD:  Not in exacerbation, optimize meds continue Spiriva  and ICS-LABA,nebs prn  Low BMI Body mass index is 18.56 kg/m.: Will benefit with RD eval due to concern for malnutrition  Mobility: Pending surgery PT Orders:  PT Follow up Rec:    DVT prophylaxis: SCDs Start: 02/26/24 0103 Code Status:   Code Status: Full Code Family Communication: plan of care  discussed with patient at bedside. Patient status is: Remains hospitalized because of severity of illness Level of care: Med-Surg   Dispo: The patient is from: home with sister            Anticipated disposition: Will need SNF placement Objective: Vitals last 24 hrs: Vitals:   02/25/24 2330 02/26/24 0015 02/26/24 0143 02/26/24 0737  BP: (!) 147/78  (!) 151/72 (!) 156/76  Pulse: 77 77 82 79  Resp:  20 17 14   Temp:   99.1 F (37.3 C) 98.7 F (37.1 C)  TempSrc:   Rectal Oral  SpO2: 93% 93% 93% 96%  Weight:      Height:        Physical Examination: General exam: alert awake, oriented, older than stated age HEENT:Oral mucosa moist, Ear/Nose WNL grossly Respiratory system: Bilaterally clear BS,no use of accessory muscle Cardiovascular system: S1 & S2 +, No JVD. Gastrointestinal system: Abdomen soft,NT,ND, BS+ Nervous System: Alert, awake, moving all extremities,and following commands. Extremities: Left hip tender to touch/move,  LE edema neg, distal extremities warm.  Skin: No rashes,no icterus. MSK: Normal muscle bulk,tone, power   Medications reviewed:  Scheduled Meds:  amLODipine   10 mg Oral Daily   atorvastatin   80 mg Oral Daily   fluticasone  furoate-vilanterol  1 puff Inhalation Daily   hydrALAZINE   20 mg Oral BID   pantoprazole   40 mg Oral BID   sertraline   50 mg Oral Daily   umeclidinium bromide   1 puff Inhalation Daily   Continuous Infusions:  0.45 % NaCl with KCl 20 mEq / L 75  mL/hr at 02/26/24 0306   Diet: Diet Order             Diet NPO time specified Except for: Sips with Meds, Ice Chips  Diet effective midnight                    Data Reviewed: I have personally reviewed following labs and imaging studies ( see epic result tab) CBC: Recent Labs  Lab 02/26/24 0012 02/26/24 0634  WBC 8.8 7.8  NEUTROABS 7.5  --   HGB 9.6* 9.7*  HCT 29.5* 29.0*  MCV 98.0 96.7  PLT 224 213   CMP: Recent Labs  Lab 02/26/24 0012 02/26/24 0634  NA 135 134*   K 3.7 4.3  CL 103 105  CO2 21* 21*  GLUCOSE 126* 141*  BUN 26* 23  CREATININE 1.07* 0.97  CALCIUM  11.1* 10.7*   GFR: Estimated Creatinine Clearance: 44.5 mL/min (by C-G formula based on SCr of 0.97 mg/dL). Recent Labs  Lab 02/26/24 0012  AST 40  ALT 31  ALKPHOS 81  BILITOT 0.3  PROT 6.6  ALBUMIN  4.2   No results for input(s): LIPASE, AMYLASE in the last 168 hours. No results for input(s): AMMONIA in the last 168 hours. Coagulation Profile: No results for input(s): INR, PROTIME in the last 168 hours. Unresulted Labs (From admission, onward)     Start     Ordered   02/26/24 0747  VITAMIN D  25 Hydroxy (Vit-D Deficiency, Fractures)  Add-on,   AD        02/26/24 0746   02/26/24 0747  Vitamin B12  Add-on,   AD        02/26/24 0746   02/26/24 0500  CBC  Daily,   R      02/26/24 0104   02/26/24 0500  Basic metabolic panel  Daily,   R      02/26/24 0104           Antimicrobials/Microbiology: Anti-infectives (From admission, onward)    None      No results found for: SDES, SPECREQUEST, CULT, REPTSTATUS  Procedures: Procedure(s) (LRB): ARTHROPLASTY, HIP, TOTAL,POSTERIOR APPROACH (Left)   Mennie LAMY, MD [facility] 02/26/2024, 10:19 AM

## 2024-02-27 DIAGNOSIS — S72002A Fracture of unspecified part of neck of left femur, initial encounter for closed fracture: Secondary | ICD-10-CM | POA: Diagnosis not present

## 2024-02-27 DIAGNOSIS — E43 Unspecified severe protein-calorie malnutrition: Secondary | ICD-10-CM | POA: Insufficient documentation

## 2024-02-27 LAB — CBC
HCT: 23.8 % — ABNORMAL LOW (ref 36.0–46.0)
Hemoglobin: 7.9 g/dL — ABNORMAL LOW (ref 12.0–15.0)
MCH: 32 pg (ref 26.0–34.0)
MCHC: 33.2 g/dL (ref 30.0–36.0)
MCV: 96.4 fL (ref 80.0–100.0)
Platelets: 180 K/uL (ref 150–400)
RBC: 2.47 MIL/uL — ABNORMAL LOW (ref 3.87–5.11)
RDW: 12.3 % (ref 11.5–15.5)
WBC: 7.9 K/uL (ref 4.0–10.5)
nRBC: 0 % (ref 0.0–0.2)

## 2024-02-27 LAB — BASIC METABOLIC PANEL WITH GFR
Anion gap: 5 (ref 5–15)
BUN: 17 mg/dL (ref 8–23)
CO2: 22 mmol/L (ref 22–32)
Calcium: 10.4 mg/dL — ABNORMAL HIGH (ref 8.9–10.3)
Chloride: 106 mmol/L (ref 98–111)
Creatinine, Ser: 1.12 mg/dL — ABNORMAL HIGH (ref 0.44–1.00)
GFR, Estimated: 53 mL/min — ABNORMAL LOW (ref >60)
Glucose, Bld: 117 mg/dL — ABNORMAL HIGH (ref 70–99)
Potassium: 3.7 mmol/L (ref 3.5–5.1)
Sodium: 133 mmol/L — ABNORMAL LOW (ref 135–145)

## 2024-02-27 MED ORDER — ENOXAPARIN SODIUM 40 MG/0.4ML IJ SOSY
40.0000 mg | PREFILLED_SYRINGE | INTRAMUSCULAR | Status: DC
  Administered 2024-02-27 – 2024-02-29 (×3): 40 mg via SUBCUTANEOUS
  Filled 2024-02-27 (×3): qty 0.4

## 2024-02-27 MED ORDER — VITAMIN B-12 1000 MCG PO TABS
500.0000 ug | ORAL_TABLET | Freq: Every day | ORAL | Status: DC
  Administered 2024-02-27 – 2024-03-01 (×4): 500 ug via ORAL
  Filled 2024-02-27 (×4): qty 1

## 2024-02-27 MED ORDER — NICOTINE 14 MG/24HR TD PT24
14.0000 mg | MEDICATED_PATCH | Freq: Every day | TRANSDERMAL | Status: DC
  Administered 2024-02-27 – 2024-03-01 (×4): 14 mg via TRANSDERMAL
  Filled 2024-02-27 (×4): qty 1

## 2024-02-27 MED ORDER — ACETAMINOPHEN 500 MG PO TABS
1000.0000 mg | ORAL_TABLET | Freq: Three times a day (TID) | ORAL | Status: DC
  Administered 2024-02-27 – 2024-03-01 (×9): 1000 mg via ORAL
  Filled 2024-02-27 (×9): qty 2

## 2024-02-27 MED ORDER — FERROUS SULFATE 325 (65 FE) MG PO TABS
325.0000 mg | ORAL_TABLET | Freq: Every day | ORAL | Status: DC
  Administered 2024-02-27 – 2024-03-01 (×4): 325 mg via ORAL
  Filled 2024-02-27 (×5): qty 1

## 2024-02-27 MED ORDER — NYSTATIN 100000 UNIT/ML MT SUSP: 5.0000 mL | Freq: Four times a day (QID) | OROMUCOSAL | Status: DC

## 2024-02-27 MED ORDER — CHOLECALCIFEROL 10 MCG (400 UNIT) PO TABS
400.0000 [IU] | ORAL_TABLET | Freq: Every day | ORAL | Status: DC
  Administered 2024-02-27 – 2024-03-01 (×4): 400 [IU] via ORAL
  Filled 2024-02-27 (×4): qty 1

## 2024-02-27 NOTE — Progress Notes (Signed)
 PROGRESS NOTE Anna Mcguire  FMW:[id number] DOB: 05/02/1956 DOA: 02/25/2024 PCP: Vicci Barnie NOVAK, MD  Brief Narrative/Hospital Course: 36 yof w/ HTN HLD,CKD 3A, COPD, and alcohol abuse in remission presented with severe left hip pain after a trip and fal when her shoe and fell onto her left side.  Prior to the fall denies any heart disease, does not experience chest discomfort with activity, and has been able to ascend 2 flights of stairs. In ZI:jqzampoz and saturating mid 90s on room air with normal HR and stable BP.  Labs are most notable for creatinine 1.07, calcium  11.1, normal WBC, and hemoglobin 9.6. X-ray showed left femoral neck fracture chest x-ray no acute finding EKG sinus rhythm.  Orthopedic consulted and admitted  Subjective: Seen and examined Resting well, sister at bedside Overnight afebrile BP stable Patient underwent left total hip arthroplasty 8/22 CBC this morning down to 7.9 hemoglobin No new complaints  Assessment and plan:  Left femoral neck fracture secondary to fall: S/P Lt THA 8/22.  Continue PT OT pain management DVT prophylaxis as per orthopedics-currently on SCD only Vitamin D  normal   Hypertension  BP controlled continue Norvasc  and hydralazine      CKD 3A  Renal function at baseline, monitor  ABLA Periopertive Normocytic anemia:  Previously 10.8 in early July 2025, no signs of bleeding -question suspect ABLA with drop in hemoglobin transfuse less than 7 g  B12 borderline will replace.  Add iron supplement Recent Labs  Lab 02/26/24 0012 02/26/24 0634 02/27/24 0720  HGB 9.6* 9.7* 7.9*  HCT 29.5* 29.0* 23.8*    COPD:  Not in exacerbation, optimize meds continue Spiriva  and ICS-LABA,nebs prn  Severe malnutrition with Body mass index is 18.54 kg/m.: Augment diet ID input appreciated   Patient requesting for yeast infection treatment and nicotine  patch  Mobility: Mobility: PT Orders: Active PT Follow up Rec:     DVT prophylaxis:  SCDs Start: 02/26/24 0103 Code Status:   Code Status: Full Code Family Communication: plan of care discussed with patient at bedside. Patient status is: Remains hospitalized because of severity of illness Level of care: Med-Surg   Dispo: The patient is from: home with sister            Anticipated disposition: Will need SNF placement Objective: Vitals last 24 hrs: Vitals:   02/26/24 1942 02/27/24 0130 02/27/24 0728 02/27/24 0837  BP: 138/79 (!) 142/67 (!) 151/67   Pulse: 79 84 86 85  Resp: 17 17 16 18   Temp: 98.5 F (36.9 C) 98.9 F (37.2 C) 99.9 F (37.7 C)   TempSrc: Oral Oral Oral   SpO2: 95% 97% 100% 99%  Weight:      Height:        Physical Examination: General exam: AAOX3 HEENT:Oral mucosa moist, Ear/Nose WNL grossly Respiratory system: Bilaterally clear BS,no use of accessory muscle Cardiovascular system: S1 & S2 +, No JVD. Gastrointestinal system: Abdomen soft,NT,ND, BS+ Nervous System: Alert, awake, moving all extremities,and following commands. Extremities: Left hip t surgical site with dressing in place Skin: No rashes,no icterus. MSK: Normal muscle bulk,tone, power   Medications reviewed:  Scheduled Meds:  amLODipine   10 mg Oral Daily   atorvastatin   80 mg Oral Daily   vitamin B-12  500 mcg Oral Daily   ferrous sulfate   325 mg Oral Q breakfast   fluticasone  furoate-vilanterol  1 puff Inhalation Daily   hydrALAZINE   20 mg Oral BID   nicotine   14 mg Transdermal Daily   nystatin   5 mL Oral QID   pantoprazole   40 mg Oral BID   sertraline   50 mg Oral Daily   umeclidinium bromide   1 puff Inhalation Daily   Continuous Infusions:  Diet: Diet Order             Diet regular Room service appropriate? Yes; Fluid consistency: Thin  Diet effective now                    Data Reviewed: I have personally reviewed following labs and imaging studies ( see epic result tab) CBC: Recent Labs  Lab 02/26/24 0012 02/26/24 0634 02/27/24 0720  WBC 8.8 7.8 7.9   NEUTROABS 7.5  --   --   HGB 9.6* 9.7* 7.9*  HCT 29.5* 29.0* 23.8*  MCV 98.0 96.7 96.4  PLT 224 213 180   CMP: Recent Labs  Lab 02/26/24 0012 02/26/24 0634 02/27/24 0720  NA 135 134* 133*  K 3.7 4.3 3.7  CL 103 105 106  CO2 21* 21* 22  GLUCOSE 126* 141* 117*  BUN 26* 23 17  CREATININE 1.07* 0.97 1.12*  CALCIUM  11.1* 10.7* 10.4*   GFR: Estimated Creatinine Clearance: 38.4 mL/min (A) (by C-G formula based on SCr of 1.12 mg/dL (H)). Recent Labs  Lab 02/26/24 0012  AST 40  ALT 31  ALKPHOS 81  BILITOT 0.3  PROT 6.6  ALBUMIN  4.2   No results for input(s): LIPASE, AMYLASE in the last 168 hours. No results for input(s): AMMONIA in the last 168 hours. Coagulation Profile: No results for input(s): INR, PROTIME in the last 168 hours. Unresulted Labs (From admission, onward)     Start     Ordered   02/26/24 0500  CBC  Daily,   R      02/26/24 0104   02/26/24 0500  Basic metabolic panel  Daily,   R      02/26/24 0104           Antimicrobials/Microbiology: Anti-infectives (From admission, onward)    Start     Dose/Rate Route Frequency Ordered Stop   02/26/24 2000  ceFAZolin  (ANCEF ) IVPB 2g/100 mL premix        2 g 200 mL/hr over 30 Minutes Intravenous Every 8 hours 02/26/24 1613 02/27/24 0608   02/26/24 1145  ceFAZolin  (ANCEF ) IVPB 2g/100 mL premix        2 g 200 mL/hr over 30 Minutes Intravenous On call to O.R. 02/26/24 1141 02/26/24 1340   02/26/24 1133  ceFAZolin  (ANCEF ) 2-4 GM/100ML-% IVPB       Note to Pharmacy: [name] HERO: cabinet override      02/26/24 1133 02/26/24 1338      No results found for: SDES, SPECREQUEST, CULT, REPTSTATUS  Procedures: Procedure(s) (LRB): TOTAL HIP ARTHROPLASTY, POSTERIOR APPROACH (Left)   Mennie LAMY, MD [facility] 02/27/2024, 11:14 AM

## 2024-02-27 NOTE — Progress Notes (Signed)
   ORTHOPAEDIC PROGRESS NOTE  s/p Procedure(s): TOTAL HIP ARTHROPLASTY, POSTERIOR APPROACH  SUBJECTIVE: Reports mild pain about operative site.   No chest pain. No SOB. No nausea/vomiting. No other complaints.  OBJECTIVE: PE: General: sitting up in hospital bed, NAD LLE: dressing CDI, leg lengths equal, warm well perfused foot, intact EHL/TA/GSC   Vitals:   02/27/24 0728 02/27/24 0837  BP: (!) 151/67   Pulse: 86 85  Resp: 16 18  Temp: 99.9 F (37.7 C)   SpO2: 100% 99%    Opiates Today (MME): Today's  total administered Morphine  Milligram Equivalents: 0 Opiates Yesterday (MME): Yesterday's total administered Morphine  Milligram Equivalents: 91 Opiates Used in the last two days:  Inpatient Morphine  Milligram Equivalents Per Day 8/21 - 8/23   Values displayed are in units of MME/Day    Order Start / End Date 8/21 Yesterday Today    oxyCODONE  (Oxy IR/ROXICODONE ) immediate release tablet 5 mg 8/22 - 8/22 -- 0 of Unknown --    oxyCODONE  (ROXICODONE ) 5 MG/5ML solution 5 mg 8/22 - 8/22 -- 0 of Unknown --       Group total: 0 of Unknown     oxyCODONE -acetaminophen  (PERCOCET/ROXICET) 5-325 MG per tablet 1 tablet 8/21 - 8/21 7.5 of 7.5 -- --    fentaNYL  (SUBLIMAZE ) injection 50 mcg 8/22 - 8/22 -- 15 of 15 --    oxyCODONE  (Oxy IR/ROXICODONE ) immediate release tablet 5 mg 8/22 - No end date -- 7.5 of 45 0 of 45    morphine  (PF) 2 MG/ML injection 0.5-2 mg 8/22 - No end date -- 6 of 12-48 0 of 12-48    HYDROmorphone  (DILAUDID ) injection 0.25-0.5 mg 8/22 - 8/22 -- 0 of 40-80 --    HYDROmorphone  (DILAUDID ) injection 0.5-1 mg 8/22 - No end date -- 0 of 20-40 0 of 60-120    fentaNYL  citrate (PF) (SUBLIMAZE ) injection 8/22 - 8/22 -- *52.5 of 52.5 --    HYDROmorphone  (DILAUDID ) injection 8/22 - 8/22 -- *10 of 10 --    Daily Totals  7.5 of 7.5 * 91 of Unknown (at least 194.5-290.5) 0 of 117-213  *One-Step medication  Calculation Errors     Order Type Date Details   oxyCODONE  (Oxy  IR/ROXICODONE ) immediate release tablet 5 mg Ordered Dose -- Insufficient frequency information   oxyCODONE  (ROXICODONE ) 5 MG/5ML solution 5 mg Ordered Dose -- Insufficient frequency information           Stable post-op images.   ASSESSMENT: Anna Mcguire is a 68 y.o. female doing well postoperatively.  PLAN: Weightbearing: WBAT LLE Insicional and dressing care: Reinforce dressings as needed Orthopedic device(s): None Showering: Hold for now VTE prophylaxis: Aspirin  81 mg BID Pain control: PRN pain medicaiton, minimize narcotics as able Follow - up plan: 2 weeks in office with Dr. Edna Dispo: TBD. PT/OT evals today.  Contact information: After hours and holidays please check [email or web address] for group call information for [facility]  Aleck Stalling, PA-C 02/27/24

## 2024-02-27 NOTE — Evaluation (Signed)
 Physical Therapy Evaluation Patient Details Name: Anna Mcguire MRN: [id number] DOB: 1955-10-06 Today's Date: 02/27/2024  History of Present Illness  Anna Mcguire is a 68 y.o. female admitted 02/25/24 after fall sustaining left displaced femoral neck fracture. Pt s/p cemented THA 8/22. PMHx: HTN, HLD, CKD 3A, COPD, and alcohol abuse in remission.  Clinical Impression  Pt admitted with above diagnosis. PTA, pt was modI for functional mobility using a SPC and modI for ADLs. Pt reports she has required increased assist from family intermittently since December 2024. She lives alone in an apartment with 6 STE. Pt has a PCA 3 days/week that assist with cooking, cleaning, and household management. Pt currently with functional limitations due to the deficits listed below (see PT Problem List). She required modA for bed mobility, mod-maxA for transfers using RW, and mod-maxA for short distance in-room ambulation using RW. Pt is limited by pain, generalized weakness, impaired balance, and decreased activity tolerance. Pt will benefit from acute skilled PT to increase her independence and safety with mobility to allow discharge. Recommend continued inpatient follow up therapy, <3 hours/day.    If plan is discharge home, recommend the following: A lot of help with bathing/dressing/bathroom;A lot of help with walking and/or transfers;Assistance with cooking/housework;Assist for transportation;Help with stairs or ramp for entrance   Can travel by private vehicle   No    Equipment Recommendations Wheelchair (measurements PT);Wheelchair cushion (measurements PT);Rolling walker (2 wheels)  Recommendations for Other Services       Functional Status Assessment Patient has had a recent decline in their functional status and demonstrates the ability to make significant improvements in function in a reasonable and predictable amount of time.     Precautions / Restrictions  Precautions Precautions: Fall Recall of Precautions/Restrictions: Impaired Restrictions Weight Bearing Restrictions Per Provider Order: Yes LLE Weight Bearing Per Provider Order: Weight bearing as tolerated Other Position/Activity Restrictions: No formal hip precautions      Mobility  Bed Mobility Overal bed mobility: Needs Assistance Bed Mobility: Rolling, Sidelying to Sit Rolling: Mod assist, Used rails Sidelying to sit: Mod assist, HOB elevated, Used rails       General bed mobility comments: Pt sat up on R side of bed with increased time. Pt began to bring BLE off EOB with assist to manage LLE, but reported too much discomfort. Instructed pt to roll onto side. Assist via bed pad to facilaite roll and pivot.    Transfers Overall transfer level: Needs assistance Equipment used: Rolling walker (2 wheels) Transfers: Sit to/from Stand, Bed to chair/wheelchair/BSC Sit to Stand: From elevated surface, Mod assist   Step pivot transfers: From elevated surface, Mod assist, Max assist       General transfer comment: Pt stood from raised bed height. Cued proper hand placement using RW. Powered up with modA. Pt opted to maintain LLE NWB. Increased time to achieve upright posture. Cued increased WBing through BUE support to offload LEs. Pt slowly accepted minimal weight on LLE. Transferred to recliner chair on right. Good eccentric control.    Ambulation/Gait Ambulation/Gait assistance: Mod assist, Max assist Gait Distance (Feet): 3 Feet Assistive device: Rolling walker (2 wheels) Gait Pattern/deviations: Step-to pattern, Decreased step length - right, Decreased step length - left, Knee flexed in stance - right, Knee flexed in stance - left, Decreased weight shift to left, Decreased stance time - left, Decreased dorsiflexion - left, Antalgic, Trunk flexed Gait velocity: decr     General Gait Details: Pt ambulated with short laborious steps.  She appeared to attempt to sit in order to  advance one LE at a time. Cued increased WBing through BUE support on RW. Mod-maxA for stability and support. Cues for sequencing. Assist to manuever RW.  Stairs            Wheelchair Mobility     Tilt Bed    Modified Rankin (Stroke Patients Only)       Balance Overall balance assessment: Needs assistance Sitting-balance support: No upper extremity supported, Feet supported Sitting balance-Leahy Scale: Fair     Standing balance support: Bilateral upper extremity supported, During functional activity, Reliant on assistive device for balance Standing balance-Leahy Scale: Poor Standing balance comment: Pt dependent on RW                             Pertinent Vitals/Pain Pain Assessment Pain Assessment: 0-10 Pain Score: 7  Pain Location: L Hip Pain Descriptors / Indicators: Operative site guarding, Guarding, Discomfort, Aching, Numbness, Tingling    Home Living Family/patient expects to be discharged to:: Private residence Living Arrangements: Alone Available Help at Discharge: Personal care attendant;Available PRN/intermittently;Family;Available 24 hours/day (PCA 3 days/week. Daughter and Sister. Pt reports one of them could stay with her temporarily and provide assistance.) Type of Home: Apartment Home Access: Stairs to enter Entrance Stairs-Rails: Right;Left;Can reach both Entrance Stairs-Number of Steps: 3+3   Home Layout: One level Home Equipment: Grab bars - tub/shower;Hand held shower head;Shower seat;BSC/3in1;Cane - single point      Prior Function Prior Level of Function : Independent/Modified Independent             Mobility Comments: She is able to get out of bed with ModI, taking increased time and reports difficulty. Pt appears to have a pretty sedentary lifestyle. ModI for short-distance ambulation with SPC. 3 falls in the past 14mo. Pt reports she has required some increased assist from family intermittently since Dec 2024. ADLs  Comments: ModI with ADLs. Sits on shower chair to bath. Manages her own medications. Relies on family for transportation.  PCA help with cooking and cleaning.     Extremity/Trunk Assessment   Upper Extremity Assessment Upper Extremity Assessment: Defer to OT evaluation    Lower Extremity Assessment Lower Extremity Assessment: Generalized weakness;LLE deficits/detail LLE: Unable to fully assess due to pain LLE Sensation: decreased light touch (pt reports decreased sensation on lateral thigh. Intact elsewhere.) LLE Coordination: decreased gross motor       Communication   Communication Communication: No apparent difficulties    Cognition Arousal: Alert Behavior During Therapy: WFL for tasks assessed/performed   PT - Cognitive impairments: No apparent impairments                       PT - Cognition Comments: Pt A,Ox4 Following commands: Intact       Cueing Cueing Techniques: Verbal cues, Gestural cues     General Comments      Exercises     Assessment/Plan    PT Assessment Patient needs continued PT services  PT Problem List Decreased strength;Decreased range of motion;Decreased activity tolerance;Decreased balance;Decreased mobility;Decreased knowledge of use of DME;Decreased safety awareness;Pain       PT Treatment Interventions DME instruction;Gait training;Stair training;Functional mobility training;Therapeutic activities;Therapeutic exercise;Balance training;Patient/family education    PT Goals (Current goals can be found in the Care Plan section)  Acute Rehab PT Goals Patient Stated Goal: Have less pain and get better PT Goal Formulation: With  patient/family Time For Goal Achievement: 03/12/24 Potential to Achieve Goals: Good    Frequency Min 2X/week     Co-evaluation               AM-PAC PT 6 Clicks Mobility  Outcome Measure Help needed turning from your back to your side while in a flat bed without using bedrails?: A Lot Help  needed moving from lying on your back to sitting on the side of a flat bed without using bedrails?: A Lot Help needed moving to and from a bed to a chair (including a wheelchair)?: A Lot Help needed standing up from a chair using your arms (e.g., wheelchair or bedside chair)?: A Lot Help needed to walk in hospital room?: Total Help needed climbing 3-5 steps with a railing? : Total 6 Click Score: 10    End of Session Equipment Utilized During Treatment: Gait belt Activity Tolerance: Patient tolerated treatment well;Patient limited by pain Patient left: in chair;with call bell/phone within reach;with family/visitor present Nurse Communication: Mobility status PT Visit Diagnosis: Difficulty in walking, not elsewhere classified (R26.2);Muscle weakness (generalized) (M62.81);Unsteadiness on feet (R26.81);Other abnormalities of gait and mobility (R26.89);Pain Pain - Right/Left: Left Pain - part of body: Hip    Time: 1337-1410 PT Time Calculation (min) (ACUTE ONLY): 33 min   Charges:   PT Evaluation $PT Eval Moderate Complexity: 1 Mod PT Treatments $Therapeutic Activity: 8-22 mins PT General Charges $$ ACUTE PT VISIT: 1 Visit         Randall SAUNDERS, PT, DPT Acute Rehabilitation Services Office: [phone] Secure Chat Preferred  Delon CHRISTELLA Callander 02/27/2024, 3:04 PM

## 2024-02-27 NOTE — Plan of Care (Signed)

## 2024-02-27 NOTE — Anesthesia Postprocedure Evaluation (Signed)
 Anesthesia Post Note  Patient: Anna Mcguire  Procedure(s) Performed: TOTAL HIP ARTHROPLASTY, POSTERIOR APPROACH (Left: Hip)     Patient location during evaluation: PACU Anesthesia Type: General Level of consciousness: awake and alert Pain management: pain level controlled Vital Signs Assessment: post-procedure vital signs reviewed and stable Respiratory status: spontaneous breathing, nonlabored ventilation and respiratory function stable Cardiovascular status: blood pressure returned to baseline and stable Postop Assessment: no apparent nausea or vomiting Anesthetic complications: no   No notable events documented.  Last Vitals:  Vitals:   02/27/24 0130 02/27/24 0728  BP: (!) 142/67 (!) 151/67  Pulse: 84 86  Resp: 17 16  Temp: 37.2 C 37.7 C  SpO2: 97% 100%    Last Pain:  Vitals:   02/27/24 0728  TempSrc: Oral  PainSc:                  Butler Levander Pinal

## 2024-02-28 ENCOUNTER — Other Ambulatory Visit

## 2024-02-28 ENCOUNTER — Inpatient Hospital Stay: Admission: RE | Admit: 2024-02-28 | Source: Ambulatory Visit

## 2024-02-28 DIAGNOSIS — S72002A Fracture of unspecified part of neck of left femur, initial encounter for closed fracture: Secondary | ICD-10-CM | POA: Diagnosis not present

## 2024-02-28 LAB — BASIC METABOLIC PANEL WITH GFR
Anion gap: 8 (ref 5–15)
BUN: 15 mg/dL (ref 8–23)
CO2: 20 mmol/L — ABNORMAL LOW (ref 22–32)
Calcium: 10.1 mg/dL (ref 8.9–10.3)
Chloride: 105 mmol/L (ref 98–111)
Creatinine, Ser: 0.92 mg/dL (ref 0.44–1.00)
GFR, Estimated: >60 mL/min (ref >60)
Glucose, Bld: 118 mg/dL — ABNORMAL HIGH (ref 70–99)
Potassium: 4 mmol/L (ref 3.5–5.1)
Sodium: 133 mmol/L — ABNORMAL LOW (ref 135–145)

## 2024-02-28 LAB — CBC
HCT: 23.6 % — ABNORMAL LOW (ref 36.0–46.0)
Hemoglobin: 7.8 g/dL — ABNORMAL LOW (ref 12.0–15.0)
MCH: 32.6 pg (ref 26.0–34.0)
MCHC: 33.1 g/dL (ref 30.0–36.0)
MCV: 98.7 fL (ref 80.0–100.0)
Platelets: 154 K/uL (ref 150–400)
RBC: 2.39 MIL/uL — ABNORMAL LOW (ref 3.87–5.11)
RDW: 12.5 % (ref 11.5–15.5)
WBC: 6.5 K/uL (ref 4.0–10.5)
nRBC: 0 % (ref 0.0–0.2)

## 2024-02-28 MED ORDER — ASPIRIN 81 MG PO TBEC: 81.0000 mg | DELAYED_RELEASE_TABLET | Freq: Two times a day (BID) | ORAL | 0 refills | Status: AC

## 2024-02-28 MED ORDER — ONDANSETRON HCL 4 MG PO TABS: 4.0000 mg | ORAL_TABLET | Freq: Three times a day (TID) | ORAL | 0 refills | Status: AC | PRN

## 2024-02-28 MED ORDER — OXYCODONE HCL 5 MG PO TABS: 5.0000 mg | ORAL_TABLET | ORAL | 0 refills | Status: AC | PRN

## 2024-02-28 NOTE — Progress Notes (Signed)
 PROGRESS NOTE Anna Mcguire  FMW:[id number] DOB: 1955-09-03 DOA: 02/25/2024 PCP: Anna Barnie NOVAK, MD  Brief Narrative/Hospital Course: 58 yof w/ HTN HLD,CKD 3A, COPD, and alcohol abuse in remission presented with severe left hip pain after a trip and fal when her shoe and fell onto her left side.  Prior to the fall denies any heart disease, does not experience chest discomfort with activity, and has been able to ascend 2 flights of stairs. In ZI:jqzampoz and saturating mid 90s on room air with normal HR and stable BP.  Labs are most notable for creatinine 1.07, calcium  11.1, normal WBC, and hemoglobin 9.6. X-ray showed left femoral neck fracture chest x-ray no acute finding EKG sinus rhythm.  Orthopedic consulted and admitted  Subjective: Seen and examined Resting comfortably pain is controlled fairly BP 140s this morning, hemoglobin 7.8 drifting down  Assessment and plan:  Left femoral neck fracture secondary to fall: S/P Lt THA 8/22.  Continue PT OT pain management DVT prophylaxis -currently on Lovenox , Tylenol  and opiates for pain management  Defer to orthopedics for DVT prophylaxis and pain management Vitamin D  normal   Hypertension  BP controlled continue Norvasc  and hydralazine      CKD 3A  Renal function at baseline, monitor  ABLA Periopertive Normocytic anemia:  Previously 10.8 in early July 2025, no signs of bleeding -question suspect ABLA with drop in hemoglobin transfuse less than 7 g  B12 borderline will replace.  Continueiron supplement-monitor hemoglobin closely Recent Labs  Lab 02/26/24 0012 02/26/24 0634 02/27/24 0720 02/28/24 0639  HGB 9.6* 9.7* 7.9* 7.8*  HCT 29.5* 29.0* 23.8* 23.6*    COPD:  Not in exacerbation, optimize meds continue Spiriva  and ICS-LABA,nebs prn  Severe malnutrition with Body mass index is 18.54 kg/m.: Augment diet ID input appreciated   Patient requesting for yeast infection treatment and nicotine   patch  Mobility: Mobility: PT Orders: Active PT Follow up Rec: Skilled Nursing-Short Term Rehab (<3 Hours/Day)02/27/2024 1300    DVT prophylaxis: enoxaparin  (LOVENOX ) injection 40 mg Start: 02/27/24 1200 SCDs Start: 02/26/24 0103 Code Status:   Code Status: Full Code Family Communication: plan of care discussed with patient at bedside. Patient status is: Remains hospitalized because of severity of illness Level of care: Med-Surg   Dispo: The patient is from: home with sister            Anticipated disposition: Waiting for SNF in 1 to 2 days   Objective: Vitals last 24 hrs: Vitals:   02/27/24 0837 02/27/24 1442 02/27/24 2035 02/28/24 0743  BP:  (!) 144/64 109/86 (!) 150/78  Pulse: 85 78 80 86  Resp: 18 16 17 17   Temp:  98.9 F (37.2 C) 98.5 F (36.9 C) 99.4 F (37.4 C)  TempSrc:  Oral Oral Oral  SpO2: 99% 99% 99% 100%  Weight:      Height:        Physical Examination: General exam: AAOX3 HEENT:Oral mucosa moist, Ear/Nose WNL grossly Respiratory system: Bilaterally clear BS,no use of accessory muscle Cardiovascular system: S1 & S2 +, No JVD. Gastrointestinal system: Abdomen soft,NT,ND, BS+ Nervous System: Alert, awake, moving all extremities,and following commands. Extremities: Left hip surgical site with Aquacel dressing in place clean dry intact  Skin: No rashes,no icterus. MSK: Normal muscle bulk,tone, power   Medications reviewed:  Scheduled Meds:  acetaminophen   1,000 mg Oral Q8H   amLODipine   10 mg Oral Daily   atorvastatin   80 mg Oral Daily   cholecalciferol   400 Units Oral Daily  vitamin B-12  500 mcg Oral Daily   enoxaparin  (LOVENOX ) injection  40 mg Subcutaneous Q24H   ferrous sulfate   325 mg Oral Q breakfast   fluticasone  furoate-vilanterol  1 puff Inhalation Daily   hydrALAZINE   20 mg Oral BID   nicotine   14 mg Transdermal Daily   pantoprazole   40 mg Oral BID   sertraline   50 mg Oral Daily   umeclidinium bromide   1 puff Inhalation Daily    Continuous Infusions:  Diet: Diet Order             Diet regular Room service appropriate? Yes; Fluid consistency: Thin  Diet effective now                    Data Reviewed: I have personally reviewed following labs and imaging studies ( see epic result tab) CBC: Recent Labs  Lab 02/26/24 0012 02/26/24 0634 02/27/24 0720 02/28/24 0639  WBC 8.8 7.8 7.9 6.5  NEUTROABS 7.5  --   --   --   HGB 9.6* 9.7* 7.9* 7.8*  HCT 29.5* 29.0* 23.8* 23.6*  MCV 98.0 96.7 96.4 98.7  PLT 224 213 180 154   CMP: Recent Labs  Lab 02/26/24 0012 02/26/24 0634 02/27/24 0720 02/28/24 0639  NA 135 134* 133* 133*  K 3.7 4.3 3.7 4.0  CL 103 105 106 105  CO2 21* 21* 22 20*  GLUCOSE 126* 141* 117* 118*  BUN 26* 23 17 15   CREATININE 1.07* 0.97 1.12* 0.92  CALCIUM  11.1* 10.7* 10.4* 10.1   GFR: Estimated Creatinine Clearance: 46.8 mL/min (by C-G formula based on SCr of 0.92 mg/dL). Recent Labs  Lab 02/26/24 0012  AST 40  ALT 31  ALKPHOS 81  BILITOT 0.3  PROT 6.6  ALBUMIN  4.2   No results for input(s): LIPASE, AMYLASE in the last 168 hours. No results for input(s): AMMONIA in the last 168 hours. Coagulation Profile: No results for input(s): INR, PROTIME in the last 168 hours. Unresulted Labs (From admission, onward)     Start     Ordered   02/26/24 0500  CBC  Daily,   R      02/26/24 0104   02/26/24 0500  Basic metabolic panel  Daily,   R      02/26/24 0104           Antimicrobials/Microbiology: Anti-infectives (From admission, onward)    Start     Dose/Rate Route Frequency Ordered Stop   02/26/24 2000  ceFAZolin  (ANCEF ) IVPB 2g/100 mL premix        2 g 200 mL/hr over 30 Minutes Intravenous Every 8 hours 02/26/24 1613 02/27/24 0608   02/26/24 1145  ceFAZolin  (ANCEF ) IVPB 2g/100 mL premix        2 g 200 mL/hr over 30 Minutes Intravenous On call to O.R. 02/26/24 1141 02/26/24 1340   02/26/24 1133  ceFAZolin  (ANCEF ) 2-4 GM/100ML-% IVPB       Note to Pharmacy:  [name] HERO: cabinet override      02/26/24 1133 02/26/24 1338      No results found for: SDES, SPECREQUEST, CULT, REPTSTATUS  Procedures: Procedure(s) (LRB): TOTAL HIP ARTHROPLASTY, POSTERIOR APPROACH (Left)   Mennie LAMY, MD [facility] 02/28/2024, 11:38 AM

## 2024-02-28 NOTE — Evaluation (Signed)
 Occupational Therapy Evaluation Patient Details Name: Anna Mcguire MRN: [id number] DOB: 08/09/1955 Today's Date: 02/28/2024   History of Present Illness   Anna Mcguire is a 68 y.o. female admitted 02/25/24 after fall sustaining left displaced femoral neck fracture. Pt s/p cemented THA 8/22. PMHx: HTN, HLD, CKD 3A, COPD, and alcohol abuse in remission.     Clinical Impressions Pt ind at baseline with use of SPC, ind with ADLs, lives alone but reports her daughter and sister may be able to assist at d/c. Pt currently needing up to max A for ADLs, mod A for bed mobility, and max A for tranfers with RW. Pt with limited ability to weightbear on RLE due to pain. Able to pivot transfer x2 to [facility] and to chair, performing standing pericare at [facility]. Pt presenting with impairments listed below, will follow acutely. Patient will benefit from continued inpatient follow up therapy, <3 hours/day to maximize safety/ind with ADL/functional mobility.      If plan is discharge home, recommend the following:   A lot of help with walking and/or transfers;A lot of help with bathing/dressing/bathroom;Assistance with cooking/housework;Direct supervision/assist for medications management;Direct supervision/assist for financial management;Assist for transportation;Help with stairs or ramp for entrance     Functional Status Assessment   Patient has had a recent decline in their functional status and demonstrates the ability to make significant improvements in function in a reasonable and predictable amount of time.     Equipment Recommendations   Other (comment) (defer)     Recommendations for Other Services   PT consult     Precautions/Restrictions   Precautions Precautions: Fall Recall of Precautions/Restrictions: Impaired Restrictions Weight Bearing Restrictions Per Provider Order: Yes LLE Weight Bearing Per Provider Order: Weight bearing as tolerated Other Position/Activity  Restrictions: No formal hip precautions     Mobility Bed Mobility Overal bed mobility: Needs Assistance Bed Mobility: Rolling, Sidelying to Sit Rolling: Mod assist, Used rails Sidelying to sit: Mod assist, HOB elevated, Used rails            Transfers Overall transfer level: Needs assistance Equipment used: Rolling walker (2 wheels) Transfers: Sit to/from Stand, Bed to chair/wheelchair/BSC Sit to Stand: Max assist     Step pivot transfers: Max assist            Balance Overall balance assessment: Needs assistance Sitting-balance support: No upper extremity supported, Feet supported Sitting balance-Leahy Scale: Fair     Standing balance support: Bilateral upper extremity supported, During functional activity, Reliant on assistive device for balance Standing balance-Leahy Scale: Poor Standing balance comment: Pt dependent on RW                           ADL either performed or assessed with clinical judgement   ADL Overall ADL's : Needs assistance/impaired Eating/Feeding: Set up;Sitting   Grooming: Set up;Sitting   Upper Body Bathing: Minimal assistance;Sitting   Lower Body Bathing: Moderate assistance;Sitting/lateral leans   Upper Body Dressing : Minimal assistance;Sitting   Lower Body Dressing: Maximal assistance;Sitting/lateral leans   Toilet Transfer: Maximal assistance;Stand-pivot;BSC/3in1;Rolling walker (2 wheels)   Toileting- Clothing Manipulation and Hygiene: Minimal assistance;Sit to/from stand       Functional mobility during ADLs: Maximal assistance;Rolling walker (2 wheels)       Vision   Vision Assessment?: No apparent visual deficits     Perception Perception: Not tested       Praxis Praxis: Not tested  Pertinent Vitals/Pain Pain Assessment Pain Assessment: Faces Pain Score: 5  Faces Pain Scale: Hurts even more Pain Location: L Hip Pain Descriptors / Indicators: Operative site guarding, Guarding, Discomfort,  Aching, Numbness, Tingling Pain Intervention(s): Repositioned, Limited activity within patient's tolerance, Monitored during session     Extremity/Trunk Assessment Upper Extremity Assessment Upper Extremity Assessment: Generalized weakness   Lower Extremity Assessment Lower Extremity Assessment: Defer to PT evaluation   Cervical / Trunk Assessment Cervical / Trunk Assessment: Normal   Communication Communication Communication: No apparent difficulties   Cognition Arousal: Alert Behavior During Therapy: WFL for tasks assessed/performed                                 Following commands: Intact       Cueing  General Comments   Cueing Techniques: Verbal cues;Gestural cues  VSS   Exercises     Shoulder Instructions      Home Living Family/patient expects to be discharged to:: Private residence Living Arrangements: Alone Available Help at Discharge: Family;Available PRN/intermittently (daughter or sister possibly when she leaves hospital) Type of Home: Apartment Home Access: Stairs to enter Entrance Stairs-Number of Steps: 3+3 Entrance Stairs-Rails: Right;Left;Can reach both Home Layout: One level     Bathroom Shower/Tub: Tub/shower unit         Home Equipment: Grab bars - tub/shower;Hand held shower head;Shower seat;BSC/3in1;Cane - single point          Prior Functioning/Environment Prior Level of Function : Independent/Modified Independent             Mobility Comments: She is able to get out of bed with ModI, taking increased time and reports difficulty. Pt appears to have a pretty sedentary lifestyle. ModI for short-distance ambulation with SPC. 3 falls in the past 84mo. Pt reports she has required some increased assist from family intermittently since Dec 2024. ADLs Comments: ModI with ADLs. Sits on shower chair to bath. Manages her own medications. Relies on family for transportation.  PCA help with cooking and cleaning. daughter and  transportation services    OT Problem List: Decreased strength;Decreased range of motion;Decreased activity tolerance;Impaired balance (sitting and/or standing);Decreased coordination   OT Treatment/Interventions: Self-care/ADL training;Therapeutic exercise;Energy conservation;DME and/or AE instruction;Therapeutic activities;Patient/family education;Balance training      OT Goals(Current goals can be found in the care plan section)   Acute Rehab OT Goals Patient Stated Goal: none stated OT Goal Formulation: With patient Time For Goal Achievement: 03/13/24 Potential to Achieve Goals: Good ADL Goals Pt Will Perform Grooming: standing;with min assist Pt Will Perform Upper Body Dressing: with min assist;sitting;standing Pt Will Perform Lower Body Dressing: with min assist;sitting/lateral leans;sit to/from stand Pt Will Transfer to Toilet: with min assist;ambulating;regular height toilet   OT Frequency:  Min 2X/week    Co-evaluation              AM-PAC OT 6 Clicks Daily Activity     Outcome Measure Help from another person eating meals?: A Little Help from another person taking care of personal grooming?: A Little Help from another person toileting, which includes using toliet, bedpan, or urinal?: A Lot Help from another person bathing (including washing, rinsing, drying)?: A Lot Help from another person to put on and taking off regular upper body clothing?: A Lot Help from another person to put on and taking off regular lower body clothing?: A Lot 6 Click Score: 14   End of Session Equipment Utilized  During Treatment: Gait belt;Rolling walker (2 wheels) Nurse Communication: Mobility status  Activity Tolerance: Patient tolerated treatment well Patient left: with call bell/phone within reach;in chair;with chair alarm set;with nursing/sitter in room  OT Visit Diagnosis: Unsteadiness on feet (R26.81);Other abnormalities of gait and mobility (R26.89);Muscle weakness  (generalized) (M62.81)                Time: 8867-8842 OT Time Calculation (min): 25 min Charges:  OT General Charges $OT Visit: 1 Visit OT Evaluation $OT Eval Moderate Complexity: 1 Mod OT Treatments $Self Care/Home Management : 8-22 mins  Emanuel Campos K, OTD, OTR/L SecureChat Preferred Acute Rehab [phone]   Anna Mcguire 02/28/2024, 12:41 PM

## 2024-02-28 NOTE — Progress Notes (Signed)
   ORTHOPAEDIC PROGRESS NOTE  s/p Procedure(s): TOTAL HIP ARTHROPLASTY, POSTERIOR APPROACH  SUBJECTIVE: Feel better today. Worked with therapy yesterday.   No chest pain. No SOB. No nausea/vomiting. No other complaints.  OBJECTIVE: PE: General: sitting up in hospital bed, NAD LLE: dressing CDI, leg lengths equal, warm well perfused foot, intact EHL/TA/GSC   Vitals:   02/27/24 2035 02/28/24 0743  BP: 109/86 (!) 150/78  Pulse: 80 86  Resp: 17 17  Temp: 98.5 F (36.9 C) 99.4 F (37.4 C)  SpO2: 99% 100%    Opiates Today (MME): Today's  total administered Morphine  Milligram Equivalents: 7.5 Opiates Yesterday (MME): Yesterday's total administered Morphine  Milligram Equivalents: 41 Opiates Used in the last two days:  Inpatient Morphine  Milligram Equivalents Per Day 8/21 - 8/24   Values displayed are in units of MME/Day    Order Start / End Date 8/21 8/22 Yesterday Today    oxyCODONE  (Oxy IR/ROXICODONE ) immediate release tablet 5 mg 8/22 - 8/22 -- 0 of Unknown -- --    oxyCODONE  (ROXICODONE ) 5 MG/5ML solution 5 mg 8/22 - 8/22 -- 0 of Unknown -- --       Group total: 0 of Unknown      oxyCODONE -acetaminophen  (PERCOCET/ROXICET) 5-325 MG per tablet 1 tablet 8/21 - 8/21 7.5 of 7.5 -- -- --    fentaNYL  (SUBLIMAZE ) injection 50 mcg 8/22 - 8/22 -- 15 of 15 -- --    oxyCODONE  (Oxy IR/ROXICODONE ) immediate release tablet 5 mg 8/22 - No end date -- 7.5 of 45 15 of 45 7.5 of 45    morphine  (PF) 2 MG/ML injection 0.5-2 mg 8/22 - No end date -- 6 of 12-48 6 of 12-48 0 of 12-48    HYDROmorphone  (DILAUDID ) injection 0.25-0.5 mg 8/22 - 8/22 -- 0 of 40-80 -- --    HYDROmorphone  (DILAUDID ) injection 0.5-1 mg 8/22 - No end date -- 0 of 20-40 20 of 60-120 0 of 60-120    fentaNYL  citrate (PF) (SUBLIMAZE ) injection 8/22 - 8/22 -- *52.5 of 52.5 -- --    HYDROmorphone  (DILAUDID ) injection 8/22 - 8/22 -- *10 of 10 -- --    Daily Totals  7.5 of 7.5 * 91 of Unknown (at least 194.5-290.5) 41 of 117-213 7.5  of 117-213  *One-Step medication  Calculation Errors     Order Type Date Details   oxyCODONE  (Oxy IR/ROXICODONE ) immediate release tablet 5 mg Ordered Dose -- Insufficient frequency information   oxyCODONE  (ROXICODONE ) 5 MG/5ML solution 5 mg Ordered Dose -- Insufficient frequency information           Stable post-op images.   ASSESSMENT: Anna Mcguire is a 68 y.o. female doing well postoperatively. POD#2  PLAN: Weightbearing: WBAT LLE Insicional and dressing care: Reinforce dressings as needed Orthopedic device(s): None Showering: Hold for now VTE prophylaxis: Lovenox  while inpatient. Aspirin  81 mg BID at discharge Pain control: PRN pain medicaiton, minimize narcotics as able Follow - up plan: 2 weeks in office with Dr. Edna Dispo: TBD. PT recommending SNF. Discharge medications printed and placed in patient's chart.  Contact information: After hours and holidays please check [email or web address] for group call information for [facility]  Aleck Stalling, PA-C 02/28/24

## 2024-02-28 NOTE — NC FL2 (Signed)
 [place]  MEDICAID FL2 LEVEL OF CARE FORM     IDENTIFICATION  Patient Name: Anna Mcguire Birthdate: Nov 28, 1955 Sex: female Admission Date (Current Location): 02/25/2024  [facility] and [place] Number:  Producer, television/film/video and Address:  [facility], [address] [place], [place] [address]      Provider Number: [id number]  Attending Physician Name and Address:  Christobal Guadalajara, MD  Relative Name and Phone Number:       Current Level of Care: Hospital Recommended Level of Care: Skilled Nursing Facility Prior Approval Number:    Date Approved/Denied: 02/28/24 PASRR Number: [id number]  Discharge Plan: SNF    Current Diagnoses: Patient Active Problem List   Diagnosis Date Noted   Protein-calorie malnutrition, severe 02/27/2024   Closed left hip fracture, initial encounter (HCC) 02/26/2024   Moderately severe major depression (HCC) 08/20/2023   Primary osteoarthritis of left knee 06/24/2023   Normocytic anemia 04/08/2023   Loss of weight 04/08/2023   Vomiting without nausea 04/08/2023   Altered bowel habits 04/08/2023   Primary hyperparathyroidism (HCC) 03/26/2023   Arthritis of carpometacarpal (CMC) joint of left thumb 04/23/2022   Pain in left shoulder 04/23/2022   Hypercalcemia 12/31/2021   Stage 3b chronic kidney disease (HCC) 12/31/2021   Allergic rhinitis 10/24/2020   Rotator cuff arthropathy of left shoulder 08/07/2020   Cervicalgia 08/07/2020   Chronic cough 03/21/2020   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   Tobacco dependence 01/30/2020   Diarrhea of infectious origin 01/30/2020   Hyponatremia 01/13/2020   Alcohol abuse 01/13/2020   Tobacco abuse 01/13/2020   Chronic diarrhea 01/13/2020   Hypophosphatemia 01/13/2020   Hypertension    Chronic obstructive pulmonary disease (HCC)    Alcohol use    Hypokalemia 01/12/2020   Tobacco use 10/06/2019   Unilateral primary osteoarthritis, left knee  09/08/2019   Pain in right shoulder 08/11/2019    Orientation RESPIRATION BLADDER Height & Weight     Self, Time, Situation  O2 Continent Weight: 114 lb 13.8 oz (52.1 kg) Height:  5' 6 (167.6 cm)  BEHAVIORAL SYMPTOMS/MOOD NEUROLOGICAL BOWEL NUTRITION STATUS      Continent Diet  AMBULATORY STATUS COMMUNICATION OF NEEDS Skin   Limited Assist Verbally Normal                       Personal Care Assistance Level of Assistance              Functional Limitations Info             SPECIAL CARE FACTORS FREQUENCY  PT (By licensed PT), OT (By licensed OT)     PT Frequency: 5X wk OT Frequency: 3X wk            Contractures Contractures Info: Not present    Additional Factors Info  Code Status Code Status Info: Full             Current Medications (02/28/2024):  This is the current hospital active medication list Current Facility-Administered Medications  Medication Dose Route Frequency Provider Last Rate Last Admin   acetaminophen  (TYLENOL ) tablet 1,000 mg  1,000 mg Oral Q8H McBane, Caroline N, PA-C   1,000 mg at 02/28/24 9445   acetaminophen  (TYLENOL ) tablet 650 mg  650 mg Oral Q6H PRN Cockerham, Alicia M, PA-C       albuterol  (PROVENTIL ) (2.5 MG/3ML) 0.083% nebulizer solution 2.5 mg  2.5 mg Nebulization Q6H PRN Cockerham, Alicia M, PA-C  amLODipine  (NORVASC ) tablet 10 mg  10 mg Oral Daily Cockerham, Alicia M, PA-C   10 mg at 02/28/24 9095   atorvastatin  (LIPITOR ) tablet 80 mg  80 mg Oral Daily Cockerham, Alicia M, PA-C   80 mg at 02/28/24 9096   cholecalciferol  (VITAMIN D3) 10 MCG (400 UNIT) tablet 400 Units  400 Units Oral Daily Christobal Guadalajara, MD   400 Units at 02/28/24 9096   cyanocobalamin  (VITAMIN B12) tablet 500 mcg  500 mcg Oral Daily Kc, Guadalajara, MD   500 mcg at 02/28/24 9096   enoxaparin  (LOVENOX ) injection 40 mg  40 mg Subcutaneous Q24H McBane, Caroline N, PA-C   40 mg at 02/28/24 1158   ferrous sulfate  tablet 325 mg  325 mg Oral Q breakfast Christobal Guadalajara, MD   325 mg at 02/28/24 9096   fluticasone  furoate-vilanterol (BREO ELLIPTA ) 100-25 MCG/ACT 1 puff  1 puff Inhalation Daily Cockerham, Alicia M, PA-C   1 puff at 02/28/24 9097   hydrALAZINE  (APRESOLINE ) tablet 20 mg  20 mg Oral BID Cockerham, Alicia M, PA-C   20 mg at 02/28/24 9095   HYDROmorphone  (DILAUDID ) injection 0.5-1 mg  0.5-1 mg Intravenous Q4H PRN Cockerham, Alicia M, PA-C   0.5 mg at 02/28/24 1157   methocarbamol  (ROBAXIN ) tablet 500 mg  500 mg Oral Q6H PRN Cockerham, Alicia M, PA-C   500 mg at 02/26/24 2109   Or   methocarbamol  (ROBAXIN ) injection 500 mg  500 mg Intravenous Q6H PRN Cockerham, Alicia M, PA-C       morphine  (PF) 2 MG/ML injection 0.5-2 mg  0.5-2 mg Intravenous Q3H PRN Cockerham, Alicia M, PA-C   2 mg at 02/27/24 1033   nicotine  (NICODERM CQ  - dosed in mg/24 hours) patch 14 mg  14 mg Transdermal Daily Kc, Guadalajara, MD   14 mg at 02/28/24 0905   ondansetron  (ZOFRAN ) injection 4 mg  4 mg Intravenous Q6H PRN Cockerham, Alicia M, PA-C   4 mg at 02/28/24 1236   oxyCODONE  (Oxy IR/ROXICODONE ) immediate release tablet 5 mg  5 mg Oral Q4H PRN Cockerham, Alicia M, PA-C   5 mg at 02/28/24 9095   pantoprazole  (PROTONIX ) EC tablet 40 mg  40 mg Oral BID Cockerham, Alicia M, PA-C   40 mg at 02/28/24 9095   senna-docusate (Senokot-S) tablet 1 tablet  1 tablet Oral QHS PRN Cockerham, Alicia M, PA-C       sertraline  (ZOLOFT ) tablet 50 mg  50 mg Oral Daily Cockerham, Alicia M, PA-C   50 mg at 02/28/24 9096   umeclidinium bromide  (INCRUSE ELLIPTA ) 62.5 MCG/ACT 1 puff  1 puff Inhalation Daily Renae Bernarda HERO, PA-C   1 puff at 02/28/24 9097     Discharge Medications: Please see discharge summary for a list of discharge medications.  Relevant Imaging Results:  Relevant Lab Results:   Additional Information [place], [place] SS#: [id number]  Darin JULIANNA Bend, LCSW

## 2024-02-28 NOTE — TOC Initial Note (Signed)
 Transition of Care [facility]) - Initial/Assessment Note    Patient Details  Name: Anna Mcguire MRN: [id number] Date of Birth: [birth year]  [facility] CM/SW Contact:    Darin JULIANNA Bend, LCSW Phone Number: 02/28/2024, 1:50 PM  Clinical Narrative:                 CSW followed-up disposition recommendations (SNF placement).  CSW completed initial TOC work-up/assessment as noted by the following below.   CSW spoke with the patient to review SNF referral process per clinical recommendations and assessed the pt's SNF preference.   The patient expressed no preference, bu to update her on the bed referrals.  CSW referral efforts to support the patient's disposition FL2:  PASRR:  SNF referrals:    TOC Disposition follow-up needs  Please provide the patient or natural support with bed-offer updates.  Please continue with SNF placement efforts. Please update the clinical team to SNF placement efforts:  No other needs identified by this Clinical research associate currently. Patient needs and current disposition to be followed by    Expected Discharge Plan: Skilled Nursing Facility Barriers to Discharge: Continued Medical Work up   Patient Goals and CMS Choice Patient states their goals for this hospitalization and ongoing recovery are:: To get better   Choice offered to / list presented to : Patient, Adult Children [place] ownership interest in [facility].provided to:: Patient    Expected Discharge Plan and Services In-house Referral: Clinical Social Work   Post Acute Care Choice: Skilled Nursing Facility Living arrangements for the past 2 months: Single Family Home                                      Prior Living Arrangements/Services Living arrangements for the past 2 months: Single Family Home Lives with:: Self Patient language and need for interpreter reviewed:: No        Need for Family Participation in Patient Care: Yes (Comment) Care giver  support system in place?: Yes (comment)   Criminal Activity/Legal Involvement Pertinent to Current Situation/Hospitalization: No - Comment as needed  Activities of Daily Living   ADL Screening (condition at time of admission) Independently performs ADLs?: Yes (appropriate for developmental age) Is the patient deaf or have difficulty hearing?: No Does the patient have difficulty seeing, even when wearing glasses/contacts?: Yes Does the patient have difficulty concentrating, remembering, or making decisions?: No  Permission Sought/Granted Permission sought to share information with : Case Manager, Family Supports    Share Information with NAME: Anna Mcguire     Permission granted to share info w Relationship: Daughter  Permission granted to share info w Contact Information: Anna Mcguire   [phone]  Emotional Assessment       Orientation: : Oriented to Self, Oriented to Place, Oriented to  Time, Oriented to Situation Alcohol / Substance Use: Not Applicable Psych Involvement: No (comment)  Admission diagnosis:  Neck pain [M54.2] Fall, initial encounter [W19.XXXA] Closed fracture of neck of left femur, initial encounter (HCC) [S72.002A] Closed left hip fracture, initial encounter (HCC) [S72.002A] Patient Active Problem List   Diagnosis Date Noted   Protein-calorie malnutrition, severe 02/27/2024   Closed left hip fracture, initial encounter (HCC) 02/26/2024   Moderately severe major depression (HCC) 08/20/2023   Primary osteoarthritis of left knee 06/24/2023   Normocytic anemia 04/08/2023   Loss of weight 04/08/2023   Vomiting without nausea 04/08/2023  Altered bowel habits 04/08/2023   Primary hyperparathyroidism (HCC) 03/26/2023   Arthritis of carpometacarpal (CMC) joint of left thumb 04/23/2022   Pain in left shoulder 04/23/2022   Hypercalcemia 12/31/2021   Stage 3b chronic kidney disease (HCC) 12/31/2021   Allergic rhinitis 10/24/2020   Rotator cuff arthropathy  of left shoulder 08/07/2020   Cervicalgia 08/07/2020   Chronic cough 03/21/2020   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   Tobacco dependence 01/30/2020   Diarrhea of infectious origin 01/30/2020   Hyponatremia 01/13/2020   Alcohol abuse 01/13/2020   Tobacco abuse 01/13/2020   Chronic diarrhea 01/13/2020   Hypophosphatemia 01/13/2020   Hypertension    Chronic obstructive pulmonary disease (HCC)    Alcohol use    Hypokalemia 01/12/2020   Tobacco use 10/06/2019   Unilateral primary osteoarthritis, left knee 09/08/2019   Pain in right shoulder 08/11/2019   PCP:  Vicci Barnie NOVAK, MD Pharmacy:   [facility] - [place], [place] - [address] [place] [address] Phone: [phone] Fax: [phone]  [facility] - [facility] [address] [place] [address] Phone: [phone] Fax: [phone]     Social Drivers of Health (SDOH) Social History: SDOH Screenings   Food Insecurity: Food Insecurity Present (02/26/2024)  Housing: High Risk (02/26/2024)  Transportation Needs: No Transportation Needs (02/26/2024)  Utilities: Not At Risk (02/26/2024)  Alcohol Screen: Low Risk  (12/15/2023)  Depression (PHQ2-9): Medium Risk (01/07/2024)  Financial Resource Strain: Low Risk  (12/15/2023)  Physical Activity: Inactive (12/15/2023)  Social Connections: Socially Isolated (02/26/2024)  Stress: No Stress Concern Present (12/15/2023)  Tobacco Use: High Risk (02/26/2024)  Health Literacy: Patient Declined (12/15/2023)   SDOH Interventions:     Readmission Risk Interventions     No data to display

## 2024-02-29 DIAGNOSIS — S72002A Fracture of unspecified part of neck of left femur, initial encounter for closed fracture: Secondary | ICD-10-CM | POA: Diagnosis not present

## 2024-02-29 LAB — BASIC METABOLIC PANEL WITH GFR
Anion gap: 5 (ref 5–15)
BUN: 14 mg/dL (ref 8–23)
CO2: 22 mmol/L (ref 22–32)
Calcium: 10.2 mg/dL (ref 8.9–10.3)
Chloride: 106 mmol/L (ref 98–111)
Creatinine, Ser: 1.05 mg/dL — ABNORMAL HIGH (ref 0.44–1.00)
GFR, Estimated: 57 mL/min — ABNORMAL LOW (ref >60)
Glucose, Bld: 119 mg/dL — ABNORMAL HIGH (ref 70–99)
Potassium: 4.1 mmol/L (ref 3.5–5.1)
Sodium: 133 mmol/L — ABNORMAL LOW (ref 135–145)

## 2024-02-29 LAB — CBC
HCT: 23.1 % — ABNORMAL LOW (ref 36.0–46.0)
Hemoglobin: 7.5 g/dL — ABNORMAL LOW (ref 12.0–15.0)
MCH: 32.2 pg (ref 26.0–34.0)
MCHC: 32.5 g/dL (ref 30.0–36.0)
MCV: 99.1 fL (ref 80.0–100.0)
Platelets: 179 K/uL (ref 150–400)
RBC: 2.33 MIL/uL — ABNORMAL LOW (ref 3.87–5.11)
RDW: 12.4 % (ref 11.5–15.5)
WBC: 6.9 K/uL (ref 4.0–10.5)
nRBC: 0 % (ref 0.0–0.2)

## 2024-02-29 LAB — HEMOGLOBIN AND HEMATOCRIT, BLOOD
HCT: 24.7 % — ABNORMAL LOW (ref 36.0–46.0)
Hemoglobin: 7.9 g/dL — ABNORMAL LOW (ref 12.0–15.0)

## 2024-02-29 MED ORDER — ALUM & MAG HYDROXIDE-SIMETH 200-200-20 MG/5ML PO SUSP: 30.0000 mL | Freq: Four times a day (QID) | ORAL | Status: DC | PRN

## 2024-02-29 NOTE — Care Management Important Message (Signed)
 Important Message  Patient Details  Name: Anna Mcguire MRN: [id number] Date of Birth: [birth year]   Important Message Given:  Yes - Medicare IM     Jon Cruel 02/29/2024, 3:08 PM

## 2024-02-29 NOTE — Progress Notes (Signed)
 PROGRESS NOTE Anna Mcguire  FMW:[id number] DOB: 01-Dec-1955 DOA: 02/25/2024 PCP: Vicci Barnie NOVAK, MD  Brief Narrative/Hospital Course: 27 yof w/ HTN HLD,CKD 3A, COPD, and alcohol abuse in remission presented with severe left hip pain after a trip and fal when her shoe and fell onto her left side.  Prior to the fall denies any heart disease, does not experience chest discomfort with activity, and has been able to ascend 2 flights of stairs. In ZI:jqzampoz and saturating mid 90s on room air with normal HR and stable BP.  Labs are most notable for creatinine 1.07, calcium  11.1, normal WBC, and hemoglobin 9.6. X-ray showed left femoral neck fracture chest x-ray no acute finding EKG sinus rhythm.  Orthopedic consulted and admitted S/P left THA 8/22 postop doing well although drifting hemoglobin working with PT OT and planning for SNF.  Subjective: Seen and examined Pleasant, no complaint Resting comfortably pain is controlled fairly BP 140s this morning, hemoglobin 7.8 drifting down  Assessment and plan:  Left femoral neck fracture secondary to fall: S/P Lt THA 8/22.  Continue PT OT pain management DVT prophylaxis -currently on Lovenox , Tylenol  and opiates for pain management  Defer to orthopedics for DVT prophylaxis and pain management Vitamin D  normal   Hypertension  BP controlled continue Norvasc  and hydralazine      CKD 3A  Renal function at baseline, monitor  ABLA Periopertive Normocytic anemia:  Previously 10.8 in early July 2025, no signs of bleeding -suspect ABLA with drop in hemoglobin, had 7.5 g this morning, on presentation around 9 B12 borderline will replace.  Continue iron supplement-may need transfusion if further drop recheck later today, patient is agreeable for transfusion if needed Recent Labs  Lab 02/26/24 0012 02/26/24 0634 02/27/24 0720 02/28/24 0639 02/29/24 0627  HGB 9.6* 9.7* 7.9* 7.8* 7.5*  HCT 29.5* 29.0* 23.8* 23.6* 23.1*    COPD:  Not in  exacerbation, optimize meds continue Spiriva  and ICS-LABA,nebs prn  Severe malnutrition with Body mass index is 18.54 kg/m.: Augment diet ID input appreciated   Mobility: PT Orders: Active PT Follow up Rec: Skilled Nursing-Short Term Rehab (<3 Hours/Day)02/27/2024 1300    DVT prophylaxis: enoxaparin  (LOVENOX ) injection 40 mg Start: 02/27/24 1200 SCDs Start: 02/26/24 0103 Code Status:   Code Status: Full Code Family Communication: plan of care discussed with patient at bedside. Patient status is: Remains hospitalized because of severity of illness Level of care: Med-Surg   Dispo: The patient is from: home with sister            Anticipated disposition: Waiting for SNF  Objective: Vitals last 24 hrs: Vitals:   02/28/24 1944 02/29/24 0429 02/29/24 0759 02/29/24 0912  BP: (!) 122/57 129/62  (!) 117/59  Pulse: 76 75  79  Resp: 18 18  18   Temp: 99 F (37.2 C) 98.2 F (36.8 C)  97.9 F (36.6 C)  TempSrc: Oral Oral    SpO2: 100% 100% 99% 99%  Weight:      Height:        Physical Examination: General exam: AAOX3.Pleasant HEENT:Oral mucosa moist, Ear/Nose WNL grossly Respiratory system: Bilaterally clear BS,no use of accessory muscle Cardiovascular system: S1 & S2 +, No JVD. Gastrointestinal system: Abdomen soft,NT,ND, BS+ Nervous System: Alert, awake, moving all extremities,and following commands. Extremities: Left hip surgical site with Aquacel dressing in place clean dry intact  Skin: No rashes,no icterus. MSK: Normal muscle bulk,tone, power   Medications reviewed:  Scheduled Meds:  acetaminophen   1,000 mg Oral Q8H  amLODipine   10 mg Oral Daily   atorvastatin   80 mg Oral Daily   cholecalciferol   400 Units Oral Daily   vitamin B-12  500 mcg Oral Daily   enoxaparin  (LOVENOX ) injection  40 mg Subcutaneous Q24H   ferrous sulfate   325 mg Oral Q breakfast   fluticasone  furoate-vilanterol  1 puff Inhalation Daily   hydrALAZINE   20 mg Oral BID   nicotine   14 mg Transdermal  Daily   pantoprazole   40 mg Oral BID   sertraline   50 mg Oral Daily   umeclidinium bromide   1 puff Inhalation Daily   Continuous Infusions:  Diet: Diet Order             Diet regular Room service appropriate? Yes; Fluid consistency: Thin  Diet effective now                    Data Reviewed: I have personally reviewed following labs and imaging studies ( see epic result tab) CBC: Recent Labs  Lab 02/26/24 0012 02/26/24 0634 02/27/24 0720 02/28/24 0639 02/29/24 0627  WBC 8.8 7.8 7.9 6.5 6.9  NEUTROABS 7.5  --   --   --   --   HGB 9.6* 9.7* 7.9* 7.8* 7.5*  HCT 29.5* 29.0* 23.8* 23.6* 23.1*  MCV 98.0 96.7 96.4 98.7 99.1  PLT 224 213 180 154 179   CMP: Recent Labs  Lab 02/26/24 0012 02/26/24 0634 02/27/24 0720 02/28/24 0639 02/29/24 0627  NA 135 134* 133* 133* 133*  K 3.7 4.3 3.7 4.0 4.1  CL 103 105 106 105 106  CO2 21* 21* 22 20* 22  GLUCOSE 126* 141* 117* 118* 119*  BUN 26* 23 17 15 14   CREATININE 1.07* 0.97 1.12* 0.92 1.05*  CALCIUM  11.1* 10.7* 10.4* 10.1 10.2   GFR: Estimated Creatinine Clearance: 41 mL/min (A) (by C-G formula based on SCr of 1.05 mg/dL (H)). Recent Labs  Lab 02/26/24 0012  AST 40  ALT 31  ALKPHOS 81  BILITOT 0.3  PROT 6.6  ALBUMIN  4.2   No results for input(s): LIPASE, AMYLASE in the last 168 hours. No results for input(s): AMMONIA in the last 168 hours. Coagulation Profile: No results for input(s): INR, PROTIME in the last 168 hours. Unresulted Labs (From admission, onward)     Start     Ordered   02/29/24 1600  Hemoglobin and hematocrit, blood  Once,   R       Question:  Specimen collection method  Answer:  Lab=Lab collect   02/29/24 1013   02/26/24 0500  CBC  Daily,   R      02/26/24 0104           Antimicrobials/Microbiology: Anti-infectives (From admission, onward)    Start     Dose/Rate Route Frequency Ordered Stop   02/26/24 2000  ceFAZolin  (ANCEF ) IVPB 2g/100 mL premix        2 g 200 mL/hr over 30  Minutes Intravenous Every 8 hours 02/26/24 1613 02/27/24 0608   02/26/24 1145  ceFAZolin  (ANCEF ) IVPB 2g/100 mL premix        2 g 200 mL/hr over 30 Minutes Intravenous On call to O.R. 02/26/24 1141 02/26/24 1340   02/26/24 1133  ceFAZolin  (ANCEF ) 2-4 GM/100ML-% IVPB       Note to Pharmacy: [name] HERO: cabinet override      02/26/24 1133 02/26/24 1338      No results found for: SDES, SPECREQUEST, CULT, REPTSTATUS  Procedures: Procedure(s) (LRB):  TOTAL HIP ARTHROPLASTY, POSTERIOR APPROACH (Left)   Mennie LAMY, MD [facility] 02/29/2024, 10:14 AM

## 2024-02-29 NOTE — Plan of Care (Signed)

## 2024-02-29 NOTE — Progress Notes (Signed)
     Subjective:  Patient reports pain as mild.  Did well with PT over the weekend. Plan for mobilization today. Dc home vs SNF. Dneies distal n/t.  Yesterday's total administered Morphine  Milligram Equivalents: 25   Objective:   VITALS:   Vitals:   02/28/24 0743 02/28/24 1451 02/28/24 1944 02/29/24 0429  BP: (!) 150/78 130/63 (!) 122/57 129/62  Pulse: 86 82 76 75  Resp: 17 15 18 18   Temp: 99.4 F (37.4 C) 99.8 F (37.7 C) 99 F (37.2 C) 98.2 F (36.8 C)  TempSrc: Oral Oral Oral Oral  SpO2: 100% 100% 100% 100%  Weight:      Height:        Sensation intact distally Intact pulses distally Dorsiflexion/Plantar flexion intact Incision: dressing C/D/I Compartment soft    Lab Results  Component Value Date   WBC 6.9 02/29/2024   HGB 7.5 (L) 02/29/2024   HCT 23.1 (L) 02/29/2024   MCV 99.1 02/29/2024   PLT 179 02/29/2024   BMET    Component Value Date/Time   NA 133 (L) 02/29/2024 0627   NA 139 01/07/2024 1204   K 4.1 02/29/2024 0627   CL 106 02/29/2024 0627   CO2 22 02/29/2024 0627   GLUCOSE 119 (H) 02/29/2024 0627   BUN 14 02/29/2024 0627   BUN 21 01/07/2024 1204   CREATININE 1.05 (H) 02/29/2024 0627   CALCIUM  10.2 02/29/2024 0627   EGFR 41 (L) 01/07/2024 1204   GFRNONAA 57 (L) 02/29/2024 0627      Xray: THA components in good position no adverse features  Assessment/Plan: 3 Days Post-Op   Principal Problem:   Closed left hip fracture, initial encounter (HCC) Active Problems:   Hypertension   Chronic obstructive pulmonary disease (HCC)   Stage 3b chronic kidney disease (HCC)   Normocytic anemia   Protein-calorie malnutrition, severe  S/p L THA for Left hip femoral neck fx 02/26/24  Post op recs: WB: WBAT LLE, No formal hip precautions Abx: ancef  Imaging: PACU pelvis Xray Dressing: Aquacell, keep intact until follow up DVT prophylaxis: Aspirin  81BID starting POD1 Follow up: 2 weeks after surgery for a wound check with Dr. Edna at [facility].  Address: [address], [place], [place] [address]  Office Phone: [phone]   TORIBIO DELENA EDNA 02/29/2024, 7:27 AM   TORIBIO Edna, MD  Contact information:   [phone] 7am-5pm epic message Dr. Edna, or call office for patient follow up: [phone] After hours and holidays please check [email or web address] for group call information for [facility]

## 2024-02-29 NOTE — TOC Progression Note (Addendum)
 Transition of Care [facility]) - Progression Note    Patient Details  Name: Anna Mcguire MRN: [id number] Date of Birth: 07-24-1955  Transition of Care [facility]) CM/SW Contact  Bridget Cordella Simmonds, LCSW Phone Number: 02/29/2024, 11:42 AM  Clinical Narrative:   Bed offers provided to pt on medicare choice document.  She will review.   1615: CSW spoke with pt and sister, pt will accept offer at [facility].  CSW confirmed with [facility].  SNF auth request submitted in [place] and approved: [id number], 3 days: 8/26-8/28.    Expected Discharge Plan: Skilled Nursing Facility Barriers to Discharge: Continued Medical Work up               Expected Discharge Plan and Services In-house Referral: Clinical Social Work   Post Acute Care Choice: Skilled Nursing Facility Living arrangements for the past 2 months: Single Family Home                                       Social Drivers of Health (SDOH) Interventions SDOH Screenings   Food Insecurity: Food Insecurity Present (02/26/2024)  Housing: High Risk (02/26/2024)  Transportation Needs: No Transportation Needs (02/26/2024)  Utilities: Not At Risk (02/26/2024)  Alcohol Screen: Low Risk  (12/15/2023)  Depression (PHQ2-9): Medium Risk (01/07/2024)  Financial Resource Strain: Low Risk  (12/15/2023)  Physical Activity: Inactive (12/15/2023)  Social Connections: Socially Isolated (02/26/2024)  Stress: No Stress Concern Present (12/15/2023)  Tobacco Use: High Risk (02/26/2024)  Health Literacy: Patient Declined (12/15/2023)    Readmission Risk Interventions     No data to display

## 2024-03-01 DIAGNOSIS — S72002A Fracture of unspecified part of neck of left femur, initial encounter for closed fracture: Secondary | ICD-10-CM | POA: Diagnosis not present

## 2024-03-01 LAB — TYPE AND SCREEN
ABO/RH(D): O POS
Antibody Screen: NEGATIVE

## 2024-03-01 LAB — CBC
HCT: 22.1 % — ABNORMAL LOW (ref 36.0–46.0)
Hemoglobin: 7.4 g/dL — ABNORMAL LOW (ref 12.0–15.0)
MCH: 32.7 pg (ref 26.0–34.0)
MCHC: 33.5 g/dL (ref 30.0–36.0)
MCV: 97.8 fL (ref 80.0–100.0)
Platelets: 204 K/uL (ref 150–400)
RBC: 2.26 MIL/uL — ABNORMAL LOW (ref 3.87–5.11)
RDW: 12.3 % (ref 11.5–15.5)
WBC: 5.6 K/uL (ref 4.0–10.5)
nRBC: 0 % (ref 0.0–0.2)

## 2024-03-01 MED ORDER — CYANOCOBALAMIN 500 MCG PO TABS: 500.0000 ug | ORAL_TABLET | Freq: Every day | ORAL | Status: AC

## 2024-03-01 NOTE — Discharge Summary (Signed)
 Physician Discharge Summary  Anna Mcguire FMW:[id number] DOB: [birth year] DOA: 02/25/2024  PCP: Vicci Barnie NOVAK, MD  Admit date: 02/25/2024 Discharge date: 03/01/2024 Recommendations for Outpatient Follow-up:  Follow up with PCP in 1 weeks-call for appointment Follow-up with orthopedics 2 weeks postop Please obtain BMP/CBC in 5 days  Discharge Dispo: SNF Discharge Condition: Stable Code Status:   Code Status: Full Code Diet recommendation:  Diet Order             Diet - low sodium heart healthy           Diet regular Room service appropriate? Yes; Fluid consistency: Thin  Diet effective now                    Brief/Interim Summary: 68 yof w/ HTN HLD,CKD 3A, COPD, and alcohol abuse in remission presented with severe left hip pain after a trip and fal when her shoe and fell onto her left side.  Prior to the fall denies any heart disease, does not experience chest discomfort with activity, and has been able to ascend 2 flights of stairs. In ZI:jqzampoz and saturating mid 90s on room air with normal HR and stable BP.  Labs are most notable for creatinine 1.07, calcium  11.1, normal WBC, and hemoglobin 9.6. X-ray showed left femoral neck fracture chest x-ray no acute finding EKG sinus rhythm.  Orthopedic consulted and admitted S/P left THA 8/22 postop doing well although drifting hemoglobin working with PT OT and planning for SNF. Patient is eager to go to rehab today  Subjective: Seen and examined Pleasant, no complaint Resting comfortably pain controlled no nausea vomiting fever chills no weakness  Discharge diagnosis  Left femoral neck fracture secondary to fall: S/P Lt THA 8/22.  Continue PT OT pain management DVT prophylaxis -currently on Lovenox , Tylenol  and opiates for pain management  Defer to orthopedics for DVT prophylaxis and pain management Vitamin D  normal   Hypertension  BP controlled continue Norvasc  and hydralazine      CKD 3A  Renal function at  baseline, monitor  ABLA Periopertive Normocytic anemia:  Previously 10.8 in early July 2025, no signs of bleeding -suspect ABLA with drop in hemoglobin>  hb holding mid 7 gm, patient asymptomatic without weakness , advised she will need close follow-up to repeat hemoglobin if less than 7 or symptomatic she will need transfusion and she is agreeable, she feels  ready to go to rehab.  Continue B12 replacement continue iron supplementation  Recent Labs  Lab 02/27/24 0720 02/28/24 0639 02/29/24 0627 02/29/24 1602 03/01/24 0557  HGB 7.9* 7.8* 7.5* 7.9* 7.4*  HCT 23.8* 23.6* 23.1* 24.7* 22.1*    COPD:  Not in exacerbation, optimize meds continue Spiriva  and ICS-LABA,nebs prn  Severe malnutrition with Body mass index is 18.54 kg/m.: Augment diet ID input appreciated   Mobility: PT Orders: Active PT Follow up Rec: Skilled Nursing-Short Term Rehab (<3 Hours/Day)02/27/2024 1300    DVT prophylaxis: enoxaparin  (LOVENOX ) injection 40 mg Start: 02/27/24 1200 SCDs Start: 02/26/24 0103 Code Status:   Code Status: Full Code Family Communication: plan of care discussed with patient at bedside. Patient status is: Remains hospitalized because of severity of illness Level of care: Med-Surg   Dispo: The patient is from: home with sister            Anticipated disposition: SNF today  Objective: Vitals last 24 hrs: Vitals:   02/29/24 1632 02/29/24 2018 03/01/24 0522 03/01/24 0806  BP: (!) 125/91 136/71 (!) 113/56  Pulse: 88 82 67   Resp: 16 16 14    Temp: 99.2 F (37.3 C) 98.9 F (37.2 C) 98.1 F (36.7 C)   TempSrc: Oral Oral Oral   SpO2: 100% 99% 100% 98%  Weight:      Height:        Physical Examination: General exam: AAOX3.Pleasant HEENT:Oral mucosa moist, Ear/Nose WNL grossly Respiratory system: Bilaterally clear BS,no use of accessory muscle Cardiovascular system: S1 & S2 +, No JVD. Gastrointestinal system: Abdomen soft,NT,ND, BS+ Nervous System: Alert, awake, moving all  extremities,and following commands. Extremities: Left hip surgical site with Aquacel dressing c/di  Skin: No rashes,no icterus. MSK: Normal muscle bulk,tone, power    Procedure(s) (LRB): TOTAL HIP ARTHROPLASTY, POSTERIOR APPROACH (Left)  Consultation: See note.  Discharge Instructions  Discharge Instructions     Diet - low sodium heart healthy   Complete by: As directed    Discharge instructions   Complete by: As directed    Check CBC in 5 to 7 days at rehab  Please call call MD or return to ER for similar or worsening recurring problem that brought you to hospital or if any fever,nausea/vomiting,abdominal pain, uncontrolled pain, chest pain,  shortness of breath or any other alarming symptoms.  Please follow-up your doctor as instructed in a week time and call the office for appointment.  Please avoid alcohol, smoking, or any other illicit substance and maintain healthy habits including taking your regular medications as prescribed.  You were cared for by a hospitalist during your hospital stay. If you have any questions about your discharge medications or the care you received while you were in the hospital after you are discharged, you can call the unit and ask to speak with the hospitalist on call if the hospitalist that took care of you is not available.  Once you are discharged, your primary care physician will handle any further medical issues. Please note that NO REFILLS for any discharge medications will be authorized once you are discharged, as it is imperative that you return to your primary care physician (or establish a relationship with a primary care physician if you do not have one) for your aftercare needs so that they can reassess your need for medications and monitor your lab values   Discharge wound care:   Complete by: As directed    Reinforce dressing as needed follow-up with orthopedics   Increase activity slowly   Complete by: As directed       Allergies  as of 03/01/2024       Reactions   Penicillins Hives, Diarrhea, Itching   Chantix [varenicline Tartrate] Other (See Comments)   Diarrhea and dizziness        Medication List     TAKE these medications    albuterol  (2.5 MG/3ML) 0.083% nebulizer solution Commonly known as: PROVENTIL  inhale THE contents of 1 vial PER nebulizer EVERY 6 HOURS AS NEEDED FOR wheezing OR SHORTNESS OF BREATH   albuterol  108 (90 Base) MCG/ACT inhaler Commonly known as: VENTOLIN  HFA INHALE 2 puffs BY MOUTH EVERY 6 HOURS AS NEEDED FOR wheezing OR SHORTNESS OF BREATH   amLODipine  10 MG tablet Commonly known as: NORVASC  Take 1 tablet (10 mg total) by mouth daily. for blood pressure   aspirin  EC 81 MG tablet Take 1 tablet (81 mg total) by mouth 2 (two) times daily for 28 days. Swallow whole.   atorvastatin  80 MG tablet Commonly known as: LIPITOR  Take 1 tablet (80 mg total) by mouth daily. What  changed: when to take this   cyanocobalamin  500 MCG tablet Commonly known as: VITAMIN B12 Take 1 tablet (500 mcg total) by mouth daily. Start taking on: March 02, 2024   Dulera  100-5 MCG/ACT Aero Generic drug: mometasone -formoterol  Inhale 2 puffs into the lungs 2 (two) times daily.   ferrous sulfate  325 (65 FE) MG tablet Take 1 tablet (325 mg total) by mouth daily with breakfast. Patient to pay out of pocket if not covered by her insurance. What changed:  when to take this additional instructions   hydrALAZINE  10 MG tablet Commonly known as: APRESOLINE  Take 2 tablets (20 mg total) by mouth in the morning and at bedtime. Stop Avapro  (irbesartan )   ondansetron  4 MG tablet Commonly known as: Zofran  Take 1 tablet (4 mg total) by mouth every 8 (eight) hours as needed for up to 14 days for nausea or vomiting.   oxyCODONE  5 MG immediate release tablet Commonly known as: Roxicodone  Take 1 tablet (5 mg total) by mouth every 4 (four) hours as needed for up to 7 days for severe pain (pain score 7-10) or  moderate pain (pain score 4-6).   pantoprazole  40 MG tablet Commonly known as: PROTONIX  TAKE 1 Tablet BY MOUTH TWICE DAILY   sertraline  50 MG tablet Commonly known as: ZOLOFT  Take 1 tablet (50 mg total) by mouth daily.   tiotropium 18 MCG inhalation capsule Commonly known as: Spiriva  HandiHaler PLACE 1 CAPSULE INTO INHALER AND INHALE DAILY   tiZANidine  4 MG tablet Commonly known as: ZANAFLEX  TAKE ONE TABLET BY MOUTH EVERY 8 HOURS AS NEEDED FOR MUSCLE SPASMS   Vitamin D  (Cholecalciferol ) 10 MCG (400 UNIT) Caps Take 400 Int'l Units by mouth daily.               Discharge Care Instructions  (From admission, onward)           Start     Ordered   03/01/24 0000  Discharge wound care:       Comments: Reinforce dressing as needed follow-up with orthopedics   03/01/24 1031            Follow-up Information     Edna Toribio LABOR, MD Follow up in 2 week(s).   Specialty: Orthopedic Surgery Contact information: [address] [place] [address] [phone]         Vicci Barnie NOVAK, MD Follow up in 1 week(s).   Specialty: Internal Medicine Contact information: [address] [place] [address] [phone]                Allergies  Allergen Reactions   Penicillins Hives, Diarrhea and Itching   Chantix [Varenicline Tartrate] Other (See Comments)    Diarrhea and dizziness    The results of significant diagnostics from this hospitalization (including imaging, microbiology, ancillary and laboratory) are listed below for reference.    Microbiology: Recent Results (from the past 240 hours)  Surgical pcr screen     Status: None   Collection Time: 02/26/24  2:59 AM   Specimen: Nasal Mucosa; Nasal Swab  Result Value Ref Range Status   MRSA, PCR NEGATIVE NEGATIVE Final   Staphylococcus aureus NEGATIVE NEGATIVE Final    Comment: (NOTE) The Xpert SA Assay (FDA approved for NASAL specimens in patients 67 years of age  and older), is one component of a comprehensive surveillance program. It is not intended to diagnose infection nor to guide or monitor treatment. Performed at [facility], [address] [place], [place] [address]     Procedures/Studies: DG HIP UNILAT W OR W/O PELVIS 2-3 VIEWS LEFT Result Date: 02/26/2024 CLINICAL DATA:  Postop. EXAM: DG HIP (WITH OR WITHOUT PELVIS) 2-3V LEFT COMPARISON:  Preoperative imaging FINDINGS: Left hip arthroplasty in expected alignment. No periprosthetic lucency or fracture. Recent postsurgical change includes air and edema in the soft tissues. IMPRESSION: Left hip arthroplasty without immediate postoperative complication. Electronically Signed   By: Andrea Gasman M.D.   On: 02/26/2024 17:55   DG CHEST PORT 1 VIEW Result Date: 02/26/2024 CLINICAL DATA:  Preop hip fracture. EXAM: PORTABLE CHEST 1 VIEW COMPARISON:  11/27/2023 FINDINGS: Heart and mediastinal contours are within normal limits. No focal opacities or effusions. No acute bony abnormality. IMPRESSION: No active disease. Electronically Signed   By: Franky Crease M.D.   On: 02/26/2024 00:41   CT Head Wo Contrast Result Date: 02/25/2024 CLINICAL DATA:  Neck trauma EXAM: CT HEAD WITHOUT CONTRAST CT CERVICAL SPINE WITHOUT CONTRAST TECHNIQUE: Multidetector CT imaging of the head and cervical spine was performed following the standard protocol without intravenous contrast. Multiplanar CT image reconstructions of the cervical spine were also generated. RADIATION DOSE REDUCTION: This exam was performed according to the departmental dose-optimization program which includes automated exposure control, adjustment of the mA and/or kV according to patient size and/or use of iterative reconstruction technique. COMPARISON:  CT head and cervical spine 04/14/2020 FINDINGS: CT HEAD FINDINGS Brain: No intracranial hemorrhage or acute infarct. Mildly hyperdense 9 mm mass along the right inferior frontal falx (series 3, image  21). This is minimally increased from 04/14/2020 when measured 8 mm. Minimal adjacent mass effect on the right frontal lobe. No hydrocephalus. No extra-axial fluid collection. Age related cerebral atrophy and chronic small vessel ischemic disease. Vascular: No hyperdense vessel. Intracranial arterial calcification. Skull: No fracture or focal lesion. Sinuses/Orbits: No acute finding. Other: None. CT CERVICAL SPINE FINDINGS Alignment: No evidence of traumatic malalignment. Grade 2 anterolisthesis of C4 is chronic and progressed from 2021. Skull base and vertebrae: No acute fracture. Soft tissues and spinal canal: No prevertebral fluid or swelling. No visible canal hematoma. Disc levels: Multilevel spondylosis, disc space height loss, degenerative endplate changes greatest at C4-C5 where it is advanced. There is ankylosis of C5 and C6. Advanced facet arthropathy from C3-C6 bilaterally. No severe spinal canal narrowing. Upper chest: No acute abnormality. Other: Carotid calcification. IMPRESSION: 1. No acute intracranial abnormality. 2. No acute fracture in the cervical spine. 3. 9 mm mass along the right inferior frontal falx, minimally increased from 04/14/2020 when measured 8 mm. This is favored to represent a meningioma. Consider nonemergent MRI with and without IV contrast for confirmation. 4. Multilevel spondylosis and advanced facet arthropathy in the cervical spine. Electronically Signed   By: Norman Gatlin M.D.   On: 02/25/2024 23:23   CT Cervical Spine Wo Contrast Result Date: 02/25/2024 CLINICAL DATA:  Neck trauma EXAM: CT HEAD WITHOUT CONTRAST CT CERVICAL SPINE WITHOUT CONTRAST TECHNIQUE: Multidetector CT imaging of the head and cervical spine was performed following the standard protocol without intravenous contrast. Multiplanar CT image reconstructions of the cervical spine were also generated. RADIATION DOSE REDUCTION: This exam was performed according to the departmental dose-optimization program  which includes automated exposure control, adjustment of the mA and/or kV according to patient size and/or use of iterative reconstruction technique. COMPARISON:  CT head and cervical spine 04/14/2020 FINDINGS: CT HEAD FINDINGS Brain: No intracranial hemorrhage or acute infarct. Mildly hyperdense 9 mm mass along the right  inferior frontal falx (series 3, image 21). This is minimally increased from 04/14/2020 when measured 8 mm. Minimal adjacent mass effect on the right frontal lobe. No hydrocephalus. No extra-axial fluid collection. Age related cerebral atrophy and chronic small vessel ischemic disease. Vascular: No hyperdense vessel. Intracranial arterial calcification. Skull: No fracture or focal lesion. Sinuses/Orbits: No acute finding. Other: None. CT CERVICAL SPINE FINDINGS Alignment: No evidence of traumatic malalignment. Grade 2 anterolisthesis of C4 is chronic and progressed from 2021. Skull base and vertebrae: No acute fracture. Soft tissues and spinal canal: No prevertebral fluid or swelling. No visible canal hematoma. Disc levels: Multilevel spondylosis, disc space height loss, degenerative endplate changes greatest at C4-C5 where it is advanced. There is ankylosis of C5 and C6. Advanced facet arthropathy from C3-C6 bilaterally. No severe spinal canal narrowing. Upper chest: No acute abnormality. Other: Carotid calcification. IMPRESSION: 1. No acute intracranial abnormality. 2. No acute fracture in the cervical spine. 3. 9 mm mass along the right inferior frontal falx, minimally increased from 04/14/2020 when measured 8 mm. This is favored to represent a meningioma. Consider nonemergent MRI with and without IV contrast for confirmation. 4. Multilevel spondylosis and advanced facet arthropathy in the cervical spine. Electronically Signed   By: Norman Gatlin M.D.   On: 02/25/2024 23:23   DG Femur Min 2 Views Left Result Date: 02/25/2024 CLINICAL DATA:  Tender to palpation.  Encounter for fracture.  EXAM: PELVIS - 1-2 VIEW; LEFT FEMUR 2 VIEWS COMPARISON:  None Available. FINDINGS: Acute displaced fracture of the left subcapital femoral neck. The femoral head remains seated in the acetabulum. There is slight superior dislocation of the femoral shaft. Left TKA. No radiographic evidence of loosening. Trace knee joint effusion. IMPRESSION: Acute displaced subcapital femoral neck fracture of the left femur. Electronically Signed   By: Norman Gatlin M.D.   On: 02/25/2024 23:06   DG Pelvis 1-2 Views Result Date: 02/25/2024 CLINICAL DATA:  Tender to palpation.  Encounter for fracture. EXAM: PELVIS - 1-2 VIEW; LEFT FEMUR 2 VIEWS COMPARISON:  None Available. FINDINGS: Acute displaced fracture of the left subcapital femoral neck. The femoral head remains seated in the acetabulum. There is slight superior dislocation of the femoral shaft. Left TKA. No radiographic evidence of loosening. Trace knee joint effusion. IMPRESSION: Acute displaced subcapital femoral neck fracture of the left femur. Electronically Signed   By: Norman Gatlin M.D.   On: 02/25/2024 23:06    Labs: BNP (last 3 results) No results for input(s): BNP in the last 8760 hours. Basic Metabolic Panel: Recent Labs  Lab 02/26/24 0012 02/26/24 0634 02/27/24 0720 02/28/24 0639 02/29/24 0627  NA 135 134* 133* 133* 133*  K 3.7 4.3 3.7 4.0 4.1  CL 103 105 106 105 106  CO2 21* 21* 22 20* 22  GLUCOSE 126* 141* 117* 118* 119*  BUN 26* 23 17 15 14   CREATININE 1.07* 0.97 1.12* 0.92 1.05*  CALCIUM  11.1* 10.7* 10.4* 10.1 10.2   Liver Function Tests: Recent Labs  Lab 02/26/24 0012  AST 40  ALT 31  ALKPHOS 81  BILITOT 0.3  PROT 6.6  ALBUMIN  4.2   No results for input(s): LIPASE, AMYLASE in the last 168 hours. No results for input(s): AMMONIA in the last 168 hours. CBC: Recent Labs  Lab 02/26/24 0012 02/26/24 0634 02/27/24 0720 02/28/24 0639 02/29/24 0627 02/29/24 1602 03/01/24 0557  WBC 8.8 7.8 7.9 6.5 6.9  --  5.6   NEUTROABS 7.5  --   --   --   --   --   --  HGB 9.6* 9.7* 7.9* 7.8* 7.5* 7.9* 7.4*  HCT 29.5* 29.0* 23.8* 23.6* 23.1* 24.7* 22.1*  MCV 98.0 96.7 96.4 98.7 99.1  --  97.8  PLT 224 213 180 154 179  --  204   CBG: No results for input(s): GLUCAP in the last 168 hours. Hgb A1c Urinalysis    Component Value Date/Time   COLORURINE YELLOW 01/12/2020 1650   APPEARANCEUR CLEAR 01/12/2020 1650   LABSPEC 1.020 07/19/2021 1708   PHURINE 5.0 07/19/2021 1708   GLUCOSEU NEGATIVE 07/19/2021 1708   HGBUR TRACE (A) 07/19/2021 1708   BILIRUBINUR negative 08/05/2022 0835   KETONESUR negative 08/05/2022 0835   KETONESUR TRACE (A) 07/19/2021 1708   PROTEINUR NEGATIVE 07/19/2021 1708   UROBILINOGEN 0.2 08/05/2022 0835   UROBILINOGEN 0.2 07/19/2021 1708   NITRITE Negative 08/05/2022 0835   NITRITE NEGATIVE 07/19/2021 1708   LEUKOCYTESUR Small (1+) (A) 08/05/2022 0835   LEUKOCYTESUR NEGATIVE 07/19/2021 1708   Sepsis Labs Recent Labs  Lab 02/27/24 0720 02/28/24 0639 02/29/24 0627 03/01/24 0557  WBC 7.9 6.5 6.9 5.6   Microbiology Recent Results (from the past 240 hours)  Surgical pcr screen     Status: None   Collection Time: 02/26/24  2:59 AM   Specimen: Nasal Mucosa; Nasal Swab  Result Value Ref Range Status   MRSA, PCR NEGATIVE NEGATIVE Final   Staphylococcus aureus NEGATIVE NEGATIVE Final    Comment: (NOTE) The Xpert SA Assay (FDA approved for NASAL specimens in patients 50 years of age and older), is one component of a comprehensive surveillance program. It is not intended to diagnose infection nor to guide or monitor treatment. Performed at [facility], [address] [place], [place] [address]     Time coordinating discharge: 35 minutes  SIGNED: Mennie LAMY, MD  [facility] 03/01/2024, 10:32 AM  If 7PM-7AM, please contact night-coverage [email or web address]

## 2024-03-01 NOTE — Progress Notes (Signed)
 Physical Therapy Treatment Patient Details Name: Anna Mcguire MRN: [id number] DOB: 1956/02/20 Today's Date: 03/01/2024   History of Present Illness Anna Mcguire is a 68 y.o. female admitted 02/25/24 after fall sustaining left displaced femoral neck fracture. Pt s/p cemented THA 8/22. PMHx: HTN, HLD, CKD 3A, COPD, and alcohol abuse in remission.    PT Comments  Pt resting in bed on arrival, pleasant and motivated for session with pt demonstrating great progress towards acute goals. Pt progressing gait this session for x2 bouts with longest bout in hall ~75' with RW for support and min A to maintain balance, chair follow provided for safety. Pt continues to demonstrate antalgic gait with decreased stride length and heavy reliance on UE support on RW placing pt at increased risk for falls. Pt provided with surgical hip HEP and verbalizing/demonstrating understanding of all. Pt continues to benefit from skilled PT services to progress toward functional mobility goals.      If plan is discharge home, recommend the following: A lot of help with bathing/dressing/bathroom;A lot of help with walking and/or transfers;Assistance with cooking/housework;Assist for transportation;Help with stairs or ramp for entrance   Can travel by private vehicle     No  Equipment Recommendations  Wheelchair (measurements PT);Wheelchair cushion (measurements PT);Rolling walker (2 wheels)    Recommendations for Other Services       Precautions / Restrictions Precautions Precautions: Fall Recall of Precautions/Restrictions: Impaired Restrictions Weight Bearing Restrictions Per Provider Order: Yes LLE Weight Bearing Per Provider Order: Weight bearing as tolerated Other Position/Activity Restrictions: No formal hip precautions     Mobility  Bed Mobility Overal bed mobility: Needs Assistance Bed Mobility: Supine to Sit     Supine to sit: Min assist     General bed mobility comments: pt  sitting up to R side of bed with min A to manage LLE.    Transfers Overall transfer level: Needs assistance Equipment used: Rolling walker (2 wheels) Transfers: Sit to/from Stand, Bed to chair/wheelchair/BSC Sit to Stand: Min assist           General transfer comment: pt standing from EOB at lowest height and elevated BSC with min A to stedy on rise, cues for hand placement on rise and when sitting    Ambulation/Gait Ambulation/Gait assistance: Min assist, +2 safety/equipment (chair follow) Gait Distance (Feet): 15 Feet (+ 75') Assistive device: Rolling walker (2 wheels) Gait Pattern/deviations: Step-to pattern, Decreased step length - right, Decreased step length - left, Knee flexed in stance - right, Knee flexed in stance - left, Decreased weight shift to left, Decreased stance time - left, Decreased dorsiflexion - left, Antalgic, Trunk flexed Gait velocity: decr     General Gait Details: pt ambulating to bathroom and then for hallway distance with min A to maintain balance and chair follow for safety. pt taking short antalgic steps, however progressing stride length and cadence with distance, cues for upright posture and closer RW proximity with good return   Stairs             Wheelchair Mobility     Tilt Bed    Modified Rankin (Stroke Patients Only)       Balance Overall balance assessment: Needs assistance Sitting-balance support: No upper extremity supported, Feet supported Sitting balance-Leahy Scale: Fair     Standing balance support: Bilateral upper extremity supported, During functional activity, Reliant on assistive device for balance Standing balance-Leahy Scale: Poor Standing balance comment: Pt dependent on RW  Communication Communication Communication: No apparent difficulties  Cognition Arousal: Alert Behavior During Therapy: WFL for tasks assessed/performed   PT - Cognitive impairments: No apparent  impairments                         Following commands: Intact      Cueing Cueing Techniques: Verbal cues, Gestural cues  Exercises Other Exercises Other Exercises: issued surgical hip HEP, pt verbalizing/demonstrating understanding of all exercises    General Comments General comments (skin integrity, edema, etc.): VSS on RA      Pertinent Vitals/Pain Pain Assessment Pain Assessment: Faces Faces Pain Scale: Hurts little more Pain Location: L Hip Pain Descriptors / Indicators: Operative site guarding, Guarding, Discomfort, Aching, Numbness, Tingling Pain Intervention(s): Monitored during session, Limited activity within patient's tolerance, Repositioned    Home Living                          Prior Function            PT Goals (current goals can now be found in the care plan section) Acute Rehab PT Goals Patient Stated Goal: Have less pain and get better PT Goal Formulation: With patient/family Time For Goal Achievement: 03/12/24 Progress towards PT goals: Progressing toward goals    Frequency    Min 2X/week      PT Plan      Co-evaluation              AM-PAC PT 6 Clicks Mobility   Outcome Measure  Help needed turning from your back to your side while in a flat bed without using bedrails?: A Little Help needed moving from lying on your back to sitting on the side of a flat bed without using bedrails?: A Little Help needed moving to and from a bed to a chair (including a wheelchair)?: A Little Help needed standing up from a chair using your arms (e.g., wheelchair or bedside chair)?: A Little Help needed to walk in hospital room?: A Lot Help needed climbing 3-5 steps with a railing? : Total 6 Click Score: 15    End of Session Equipment Utilized During Treatment: Gait belt Activity Tolerance: Patient tolerated treatment well;Patient limited by pain Patient left: in chair;with call bell/phone within reach;with chair alarm  set Nurse Communication: Mobility status PT Visit Diagnosis: Difficulty in walking, not elsewhere classified (R26.2);Muscle weakness (generalized) (M62.81);Unsteadiness on feet (R26.81);Other abnormalities of gait and mobility (R26.89);Pain Pain - Right/Left: Left Pain - part of body: Hip     Time: 8876-8856 PT Time Calculation (min) (ACUTE ONLY): 20 min  Charges:    $Gait Training: 8-22 mins PT General Charges $$ ACUTE PT VISIT: 1 Visit                     Wilma Wuthrich R. PTA Acute Rehabilitation Services Office: [phone]   Therisa CHRISTELLA Boor 03/01/2024, 12:37 PM

## 2024-03-01 NOTE — Progress Notes (Signed)
 PTAR arrived to transport pt to [place], family at bedside and helped gather all personal belongings.

## 2024-03-01 NOTE — Progress Notes (Signed)
 [facility] called to give report unable to reach RN, Diplomatic Services operational officer took call back number.   2 paper scripts for a total of 3 scripts placed in packet along with AVS.

## 2024-03-01 NOTE — Plan of Care (Signed)
  Problem: Education: Goal: Knowledge of General Education information will improve Description: Including pain rating scale, medication(s)/side effects and non-pharmacologic comfort measures Outcome: Progressing   Problem: Clinical Measurements: Goal: Ability to maintain clinical measurements within normal limits will improve Outcome: Progressing Goal: Respiratory complications will improve Outcome: Progressing Goal: Cardiovascular complication will be avoided Outcome: Progressing   Problem: Pain Managment: Goal: General experience of comfort will improve and/or be controlled Outcome: Progressing   Problem: Activity: Goal: Risk for activity intolerance will decrease Outcome: Not Progressing

## 2024-03-01 NOTE — TOC Transition Note (Signed)
 Transition of Care [facility]) - Discharge Note   Patient Details  Name: Anna Mcguire MRN: [id number] Date of Birth: April 25, 1956  Transition of Care [facility]) CM/SW Contact:  Bridget Cordella Simmonds, LCSW Phone Number: 03/01/2024, 11:52 AM   Clinical Narrative:   Pt discharging to [facility], room 210.  RN call report to [phone].  PTAR called [phone].     Final next level of care: Skilled Nursing Facility Barriers to Discharge: Barriers Resolved   Patient Goals and CMS Choice Patient states their goals for this hospitalization and ongoing recovery are:: To get better   Choice offered to / list presented to : Patient, Adult Children [place] ownership interest in [facility].provided to:: Patient    Discharge Placement              Patient chooses bed at: [facility] Patient to be transferred to facility by: ptar Name of family member notified: Reubin, sister Patient and family notified of of transfer: 03/01/24  Discharge Plan and Services Additional resources added to the After Visit Summary for   In-house Referral: Clinical Social Work   Post Acute Care Choice: Skilled Nursing Facility                               Social Drivers of Health (SDOH) Interventions SDOH Screenings   Food Insecurity: Food Insecurity Present (02/26/2024)  Housing: High Risk (02/26/2024)  Transportation Needs: No Transportation Needs (02/26/2024)  Utilities: Not At Risk (02/26/2024)  Alcohol Screen: Low Risk  (12/15/2023)  Depression (PHQ2-9): Medium Risk (01/07/2024)  Financial Resource Strain: Low Risk  (12/15/2023)  Physical Activity: Inactive (12/15/2023)  Social Connections: Socially Isolated (02/26/2024)  Stress: No Stress Concern Present (12/15/2023)  Tobacco Use: High Risk (02/26/2024)  Health Literacy: Patient Declined (12/15/2023)     Readmission Risk Interventions     No data to display

## 2024-03-01 NOTE — Progress Notes (Signed)
 DC order noted per MD. DC RN at bedside. Plan for [facility] once bed ready. Transfer packet started with AVS set with patient chart. Primary RN informed.

## 2024-03-01 NOTE — TOC Progression Note (Addendum)
 Transition of Care [facility]) - Progression Note    Patient Details  Name: Anna Mcguire MRN: [id number] Date of Birth: July 15, 1955  [facility]) CM/SW Contact  Bridget Cordella Simmonds, LCSW Phone Number: 03/01/2024, 10:25 AM  Clinical Narrative:   CSW confirmed with Tanya/[facility]: they can receive pt today but room is not available yet, she will call when the room is ready.    TC Quandra/[facility]: she cannot pull PASSR up in [place] [facility].  CSW looked it up, PASSR is in [place] [facility] but name is entered incorrectly as [name]  DOB and SSN are correct.  Quandra called [place] [facility] and they are correcting it.   1140: message/Quandra: room is ready  Expected Discharge Plan: Skilled Nursing Facility Barriers to Discharge: Continued Medical Work up               Expected Discharge Plan and Services In-house Referral: Clinical Social Work   Post Acute Care Choice: Skilled Nursing Facility Living arrangements for the past 2 months: Single Family Home                                       Social Drivers of Health (SDOH) Interventions SDOH Screenings   Food Insecurity: Food Insecurity Present (02/26/2024)  Housing: High Risk (02/26/2024)  Transportation Needs: No Transportation Needs (02/26/2024)  Utilities: Not At Risk (02/26/2024)  Alcohol Screen: Low Risk  (12/15/2023)  Depression (PHQ2-9): Medium Risk (01/07/2024)  Financial Resource Strain: Low Risk  (12/15/2023)  Physical Activity: Inactive (12/15/2023)  Social Connections: Socially Isolated (02/26/2024)  Stress: No Stress Concern Present (12/15/2023)  Tobacco Use: High Risk (02/26/2024)  Health Literacy: Patient Declined (12/15/2023)    Readmission Risk Interventions     No data to display

## 2024-03-02 ENCOUNTER — Encounter (HOSPITAL_COMMUNITY): Payer: Self-pay | Admitting: Orthopedic Surgery

## 2024-03-14 ENCOUNTER — Other Ambulatory Visit: Payer: Self-pay | Admitting: Internal Medicine

## 2024-03-14 DIAGNOSIS — I10 Essential (primary) hypertension: Secondary | ICD-10-CM

## 2024-03-14 DIAGNOSIS — J449 Chronic obstructive pulmonary disease, unspecified: Secondary | ICD-10-CM

## 2024-03-15 ENCOUNTER — Telehealth: Payer: Self-pay | Admitting: Internal Medicine

## 2024-03-15 NOTE — Telephone Encounter (Signed)
 Let me know if you need me to sch appt     Copied from CRM #[id number]. Topic: General - Other >> Mar 15, 2024 10:21 AM Zebedee SAUNDERS wrote: Reason for CRM: Pt thinks she may have a UTI would like something called in the pharmacy [facility] [place], [place] - [address] [place] [address] Phone: [phone] Fax: [phone]. Please call pt at [phone]

## 2024-03-16 NOTE — Telephone Encounter (Signed)
 Give appointment with any available provider today or tomorrow.

## 2024-03-17 ENCOUNTER — Ambulatory Visit: Admitting: Internal Medicine

## 2024-03-17 NOTE — Telephone Encounter (Signed)
 Noted! Thank you

## 2024-03-17 NOTE — Telephone Encounter (Signed)
 FYI  Patient scheduled for tomorrow with Dr. Vicci at 10:50 am.

## 2024-03-17 NOTE — Telephone Encounter (Signed)
 Copied from CRM #[id number]. Topic: Appointments - Scheduling Inquiry for Clinic >> Mar 17, 2024 11:22 AM Berwyn MATSU wrote:  Patient/patient representative is calling for information regarding an appointment.  Per patient she can not come in today she has another appoitnment at 1pm. She is asking if she can reschedule appointment.   May you please advise.

## 2024-03-18 ENCOUNTER — Ambulatory Visit: Attending: Internal Medicine | Admitting: Internal Medicine

## 2024-03-18 ENCOUNTER — Telehealth: Payer: Self-pay

## 2024-03-18 ENCOUNTER — Other Ambulatory Visit (HOSPITAL_COMMUNITY)
Admission: RE | Admit: 2024-03-18 | Discharge: 2024-03-18 | Disposition: A | Discharge status: Home / Self Care | Source: Ambulatory Visit | Attending: Internal Medicine | Admitting: Internal Medicine

## 2024-03-18 ENCOUNTER — Other Ambulatory Visit: Payer: Self-pay

## 2024-03-18 VITALS — BP 129/54 | HR 84 | Temp 98.4°F | Ht 66.0 in | Wt 116.0 lb

## 2024-03-18 DIAGNOSIS — R1012 Left upper quadrant pain: Secondary | ICD-10-CM

## 2024-03-18 DIAGNOSIS — N760 Acute vaginitis: Secondary | ICD-10-CM

## 2024-03-18 DIAGNOSIS — D649 Anemia, unspecified: Secondary | ICD-10-CM | POA: Diagnosis not present

## 2024-03-18 DIAGNOSIS — F17210 Nicotine dependence, cigarettes, uncomplicated: Secondary | ICD-10-CM

## 2024-03-18 DIAGNOSIS — N858 Other specified noninflammatory disorders of uterus: Secondary | ICD-10-CM

## 2024-03-18 DIAGNOSIS — K869 Disease of pancreas, unspecified: Secondary | ICD-10-CM

## 2024-03-18 DIAGNOSIS — K5903 Drug induced constipation: Secondary | ICD-10-CM

## 2024-03-18 DIAGNOSIS — R1032 Left lower quadrant pain: Secondary | ICD-10-CM

## 2024-03-18 DIAGNOSIS — Z1231 Encounter for screening mammogram for malignant neoplasm of breast: Secondary | ICD-10-CM

## 2024-03-18 DIAGNOSIS — E213 Hyperparathyroidism, unspecified: Secondary | ICD-10-CM

## 2024-03-18 DIAGNOSIS — Z780 Asymptomatic menopausal state: Secondary | ICD-10-CM

## 2024-03-18 DIAGNOSIS — Z87311 Personal history of (healed) other pathological fracture: Secondary | ICD-10-CM

## 2024-03-18 MED ORDER — CIPROFLOXACIN HCL 500 MG PO TABS: 500.0000 mg | ORAL_TABLET | Freq: Two times a day (BID) | ORAL | 0 refills | Status: AC

## 2024-03-18 MED ORDER — POLYETHYLENE GLYCOL 3350 17 GM/SCOOP PO POWD: 17.0000 g | Freq: Every day | ORAL | 2 refills | Status: AC | PRN

## 2024-03-18 MED ORDER — FLUZONE HIGH-DOSE 0.5 ML IM SUSY
0.5000 mL | PREFILLED_SYRINGE | Freq: Once | INTRAMUSCULAR | 0 refills | Status: AC
  Filled 2024-03-18: qty 0.5, 1d supply, fill #0

## 2024-03-18 MED ORDER — FLUCONAZOLE 150 MG PO TABS: 150.0000 mg | ORAL_TABLET | Freq: Every day | ORAL | 0 refills | Status: AC

## 2024-03-18 MED ORDER — METRONIDAZOLE 500 MG PO TABS
500.0000 mg | ORAL_TABLET | Freq: Two times a day (BID) | ORAL | 0 refills | Status: AC
Start: 2024-03-18 — End: ?

## 2024-03-18 NOTE — Telephone Encounter (Signed)
 Duplicate message

## 2024-03-18 NOTE — Telephone Encounter (Signed)
 [facility] has been called and informed that Provider states to not take the Lipitor  medication while taking the Cipro . LVM for patient to return call. Pharmacy tech states that he will also put a note in the file to inform patient upon delivery.       Copied from CRM #[id number]. Topic: Clinical - Prescription Issue >> Mar 18, 2024 12:33 PM Nathanel BROCKS wrote: Reason for CRM: [name], [phone]  The rx that was written today has a reaction with another medication that she takes, prescibed today Cipro  500 mg and the interaction medication is Lipitor . Please call pharmacy and advise if they need to proceed with filling this rx.

## 2024-03-18 NOTE — Progress Notes (Unsigned)
 Patient ID: Anna Mcguire, female    DOB: 1955-12-18  MRN: [id number]  CC: Urinary Tract Infection (Pressure/ pain while urinating & sitting, vaginal itching, vaginal discharge X1 week/Requesting muscle relaxers)   Subjective: Anna Mcguire is a 68 y.o. female who presents for possible UTI management. Her concerns today include:  patient with history of HTN, HL, tob dep, COPD, OA left knee, EtOH abuse, chronic diarrhea, SIADH, ACD with macrocytosis, CKD 3, Hypercalcemia likely due to primary hyperparathyroid   Discussed the use of AI scribe software for clinical note transcription with the patient, who gave verbal consent to proceed.  History of Present Illness Anna Mcguire is a 68 year old female who presents with symptoms suggestive of a urinary tract infection and abdominal pain.  She has been experiencing left lower abdominal pain for at least a week, describing it as intermittent and lasting at least fifteen minutes when it occurs. The pain is not constant but can be quite severe at times. She also notes constipation, having been constipated for a week before starting to have bowel movements twice a day since Saturday, though they remain hard and somewhat constipated.  She reports frequent urination and a slight discoloration of urine. No burning or pain during urination. She has been experiencing vaginal itching for about a week, with occasional white discharge. She also reports having low-grade fevers daily for a few days, though she has not measured her temperature.  She was recently hospitalized for a left hip fracture, which required hip replacement surgery. She is currently undergoing physical therapy twice a week. She takes vitamin D  2000 IU daily and iron supplements 325 mg daily for anemia, which worsened during her hospital stay. She also takes oxycodone  every four hours for pain.  She recalls tripping and falling at home, possibly due to feeling dizzy and  having low energy that day. She was alone at the time of the fall and had to call for help.    Patient Active Problem List   Diagnosis Date Noted   Protein-calorie malnutrition, severe 02/27/2024   Closed left hip fracture, initial encounter (HCC) 02/26/2024   Moderately severe major depression (HCC) 08/20/2023   Primary osteoarthritis of left knee 06/24/2023   Normocytic anemia 04/08/2023   Loss of weight 04/08/2023   Vomiting without nausea 04/08/2023   Altered bowel habits 04/08/2023   Primary hyperparathyroidism (HCC) 03/26/2023   Arthritis of carpometacarpal (CMC) joint of left thumb 04/23/2022   Pain in left shoulder 04/23/2022   Hypercalcemia 12/31/2021   Stage 3b chronic kidney disease (HCC) 12/31/2021   Allergic rhinitis 10/24/2020   Rotator cuff arthropathy of left shoulder 08/07/2020   Cervicalgia 08/07/2020   Chronic cough 03/21/2020   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   Tobacco dependence 01/30/2020   Diarrhea of infectious origin 01/30/2020   Hyponatremia 01/13/2020   Alcohol abuse 01/13/2020   Tobacco abuse 01/13/2020   Chronic diarrhea 01/13/2020   Hypophosphatemia 01/13/2020   Hypertension    Chronic obstructive pulmonary disease (HCC)    Alcohol use    Hypokalemia 01/12/2020   Tobacco use 10/06/2019   Unilateral primary osteoarthritis, left knee 09/08/2019   Pain in right shoulder 08/11/2019     Current Outpatient Medications on File Prior to Visit  Medication Sig Dispense Refill   albuterol  (PROVENTIL ) (2.5 MG/3ML) 0.083% nebulizer solution inhale THE contents of 1 vial PER nebulizer EVERY 6 HOURS AS NEEDED FOR wheezing OR SHORTNESS OF BREATH 150 mL 5  albuterol  (VENTOLIN  HFA) 108 (90 Base) MCG/ACT inhaler INHALE 2 puffs BY MOUTH EVERY 6 HOURS AS NEEDED FOR wheezing OR SHORTNESS OF BREATH 8.5 g 5   amLODipine  (NORVASC ) 10 MG tablet Take 1 tablet (10 mg total) by mouth daily. for blood pressure 90 tablet 1   aspirin  EC 81  MG tablet Take 1 tablet (81 mg total) by mouth 2 (two) times daily for 28 days. Swallow whole. 56 tablet 0   atorvastatin  (LIPITOR ) 80 MG tablet Take 1 tablet (80 mg total) by mouth daily. 90 tablet 1   cyanocobalamin  (VITAMIN B12) 500 MCG tablet Take 1 tablet (500 mcg total) by mouth daily.     ferrous sulfate  325 (65 FE) MG tablet Take 1 tablet (325 mg total) by mouth daily with breakfast. Patient to pay out of pocket if not covered by her insurance. (Patient taking differently: Take 325 mg by mouth 3 (three) times a week. Take one tablet by mouth on Mon-Tues-Weds.) 100 tablet 1   hydrALAZINE  (APRESOLINE ) 10 MG tablet TAKE TWO TABLETS BY MOUTH IN THE MORNING AND at bedtime (STOP avapro  (irbesartan )) 360 tablet 0   mometasone -formoterol  (DULERA ) 100-5 MCG/ACT AERO INHALE 2 PUFFS INTO THE LUNGS TWICE DAILY 13 g 2   pantoprazole  (PROTONIX ) 40 MG tablet TAKE 1 Tablet BY MOUTH TWICE DAILY 60 tablet 2   sertraline  (ZOLOFT ) 50 MG tablet Take 1 tablet (50 mg total) by mouth daily. 90 tablet 1   tiotropium (SPIRIVA  HANDIHALER) 18 MCG inhalation capsule PLACE 1 CAPSULE INTO INHALER AND INHALE DAILY 90 capsule PRN   tiZANidine  (ZANAFLEX ) 4 MG tablet TAKE ONE TABLET BY MOUTH EVERY 8 HOURS AS NEEDED FOR MUSCLE SPASMS 30 tablet 0   Vitamin D , Cholecalciferol , 10 MCG (400 UNIT) CAPS Take 400 Int'l Units by mouth daily. 100 capsule 1   [DISCONTINUED] fluticasone  (FLONASE) 50 MCG/ACT nasal spray Place 1 spray into both nostrils daily.     [DISCONTINUED] lovastatin (MEVACOR) 20 MG tablet Take 20 mg by mouth daily at 6 PM.     No current facility-administered medications on file prior to visit.    Allergies  Allergen Reactions   Penicillins Hives, Diarrhea and Itching   Chantix [Varenicline Tartrate] Other (See Comments)    Diarrhea and dizziness    Social History   Socioeconomic History   Marital status: Single    Spouse name: Not on file   Number of children: Not on file   Years of education: Not on  file   Highest education level: Not on file  Occupational History   Not on file  Tobacco Use   Smoking status: Every Day    Current packs/day: 0.25    Types: Cigarettes   Smokeless tobacco: Never   Tobacco comments:    3 times a week - 10/24/2020    Buproprion helps  Vaping Use   Vaping status: Never Used  Substance and Sexual Activity   Alcohol use: Yes    Comment: occasionally   Drug use: Yes    Types: Marijuana   Sexual activity: Not on file  Other Topics Concern   Not on file  Social History Narrative   Retired   Social Drivers of Corporate investment banker Strain: Low Risk  (12/15/2023)   Overall Financial Resource Strain (CARDIA)    Difficulty of Paying Living Expenses: Not hard at all  Food Insecurity: Food Insecurity Present (02/26/2024)   Hunger Vital Sign    Worried About Running Out of Food in the Last  Year: Sometimes true    Ran Out of Food in the Last Year: Sometimes true  Transportation Needs: No Transportation Needs (02/26/2024)   PRAPARE - Administrator, Civil Service (Medical): No    Lack of Transportation (Non-Medical): No  Physical Activity: Inactive (12/15/2023)   Exercise Vital Sign    Days of Exercise per Week: 0 days    Minutes of Exercise per Session: 0 min  Stress: No Stress Concern Present (12/15/2023)   [employer] of Occupational Health - Occupational Stress Questionnaire    Feeling of Stress : Not at all  Social Connections: Socially Isolated (02/26/2024)   Social Connection and Isolation Panel    Frequency of Communication with Friends and Family: More than three times a week    Frequency of Social Gatherings with Friends and Family: More than three times a week    Attends Religious Services: Never    Database administrator or Organizations: No    Attends Banker Meetings: Never    Marital Status: Never married  Intimate Partner Violence: Not At Risk (02/26/2024)   Humiliation, Afraid, Rape, and Kick  questionnaire    Fear of Current or Ex-Partner: No    Emotionally Abused: No    Physically Abused: No    Sexually Abused: No    Family History  Problem Relation Age of Onset   Kidney failure Mother    Aneurysm Father     Past Surgical History:  Procedure Laterality Date   COLONOSCOPY WITH PROPOFOL  N/A 06/07/2014   Procedure: COLONOSCOPY WITH PROPOFOL ;  Surgeon: Elsie Cree, MD;  Location: [facility];  Service: Endoscopy;  Laterality: N/A;   ESOPHAGOGASTRODUODENOSCOPY (EGD) WITH PROPOFOL  N/A 06/07/2014   Procedure: ESOPHAGOGASTRODUODENOSCOPY (EGD) WITH PROPOFOL ;  Surgeon: Elsie Cree, MD;  Location: [facility];  Service: Endoscopy;  Laterality: N/A;   TOTAL HIP ARTHROPLASTY Left 02/26/2024   Procedure: TOTAL HIP ARTHROPLASTY, POSTERIOR APPROACH;  Surgeon: Edna Toribio LABOR, MD;  Location: [facility];  Service: Orthopedics;  Laterality: Left;   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   Procedure: TOTAL KNEE ARTHROPLASTY;  Surgeon: Edna Toribio LABOR, MD;  Location: [facility];  Service: Orthopedics;  Laterality: Left;    ROS: Review of Systems Negative except as stated above  PHYSICAL EXAM: BP (!) 129/54 (BP Location: Left Arm, Patient Position: Sitting, Cuff Size: Normal)   Pulse 84   Temp 98.4 F (36.9 C) (Oral)   Ht 5' 6 (1.676 m)   Wt 116 lb (52.6 kg)   SpO2 100%   BMI 18.72 kg/m   Wt Readings from Last 3 Encounters:  03/18/24 116 lb (52.6 kg)  02/26/24 114 lb 13.8 oz (52.1 kg)  01/07/24 108 lb (49 kg)    Physical Exam  {female adult master:[id number]}      Latest Ref Rng & Units 02/29/2024    6:27 AM 02/28/2024    6:39 AM 02/27/2024    7:20 AM  CMP  Glucose 70 - 99 mg/dL 880  881  882   BUN 8 - 23 mg/dL 14  15  17    Creatinine 0.44 - 1.00 mg/dL 8.94  9.07  8.87   Sodium 135 - 145 mmol/L 133  133  133   Potassium 3.5 - 5.1 mmol/L 4.1  4.0  3.7   Chloride 98 - 111 mmol/L 106  105  106   CO2 22 - 32 mmol/L 22  20  22    Calcium  8.9 - 10.3 mg/dL 89.7  89.8  10.4    Lipid Panel     Component Value Date/Time   CHOL 155 03/20/2021 1454   TRIG 258 (H) 03/20/2021 1454   HDL 82 03/20/2021 1454   CHOLHDL 1.9 03/20/2021 1454   CHOLHDL 1.5 01/15/2020 0258   VLDL 23 01/15/2020 0258   LDLCALC 35 03/20/2021 1454    CBC    Component Value Date/Time   WBC 5.6 03/01/2024 0557   RBC 2.26 (L) 03/01/2024 0557   HGB 7.4 (L) 03/01/2024 0557   HGB 10.8 (L) 01/07/2024 1204   HCT 22.1 (L) 03/01/2024 0557   HCT 33.3 (L) 01/07/2024 1204   PLT 204 03/01/2024 0557   PLT 251 01/07/2024 1204   MCV 97.8 03/01/2024 0557   MCV 98 (H) 01/07/2024 1204   MCH 32.7 03/01/2024 0557   MCHC 33.5 03/01/2024 0557   RDW 12.3 03/01/2024 0557   RDW 13.0 01/07/2024 1204   LYMPHSABS 0.8 02/26/2024 0012   MONOABS 0.3 02/26/2024 0012   EOSABS 0.1 02/26/2024 0012   BASOSABS 0.0 02/26/2024 0012    ASSESSMENT AND PLAN:  Assessment and Plan Assessment & Plan Suspected urinary tract infection and possible diverticulitis of colon Intermittent left lower abdominal pain with constipation and urinary symptoms. Differential includes UTI and diverticulitis, considering prior diverticulosis. - Prescribe antibiotics for UTI and diverticulitis. - Advise emergency care if pain worsens.  Constipation Constipation with hard stools, likely due to oxycodone  use post-surgery. - Prescribe Miralax  daily as needed.  Vaginal candidiasis (yeast infection) Vaginal itching and white discharge suggestive of yeast infection. - Prescribe medication for yeast infection.  Left hip fracture, status post hip replacement Status post left hip replacement, undergoing physical therapy.  Anemia, under evaluation Worsening anemia during recent hospitalization, on iron supplementation. - Order lab tests to recheck blood count.  Osteoporosis, suspected Suspected osteoporosis due to recent hip fracture. Bone density study needed for diagnosis and treatment guidance. - Order bone density study. -  Refer to osteoporosis clinic.  Pancreatic mass, under evaluation Pancreatic mass identified on prior CT scan, MRI needed for further evaluation. - Reschedule MRI of abdomen.  Uterine mass, under evaluation Uterine mass identified on prior CT scan, MRI needed for further evaluation. - Reschedule MRI of abdomen.     1. Dysuria (Primary) *** - POCT URINALYSIS DIP (CLINITEK)    Patient was given the opportunity to ask questions.  Patient verbalized understanding of the plan and was able to repeat key elements of the plan.   This documentation was completed using Paediatric nurse.  Any transcriptional errors are unintentional.  Orders Placed This Encounter  Procedures   POCT URINALYSIS DIP (CLINITEK)     Requested Prescriptions    No prescriptions requested or ordered in this encounter    No follow-ups on file.  Barnie Louder, MD, FACP

## 2024-03-18 NOTE — Patient Instructions (Signed)
 VISIT SUMMARY:  Today, we addressed your symptoms of abdominal pain, urinary issues, and vaginal itching. We also reviewed your recent hip surgery, anemia, and other ongoing health concerns.  YOUR PLAN:  -SUSPECTED URINARY TRACT INFECTION AND POSSIBLE DIVERTICULITIS OF COLON: You have symptoms that suggest a urinary tract infection and possibly diverticulitis, which is an inflammation of small pouches in the colon. We have prescribed antibiotics to treat both conditions. If your pain worsens, please seek emergency care.  -CONSTIPATION: Your constipation is likely due to the oxycodone  you are taking for pain after your hip surgery. We have prescribed Miralax  to help relieve your constipation. Take it daily as needed.  -VAGINAL CANDIDIASIS (YEAST INFECTION): Your vaginal itching and white discharge suggest a yeast infection. We have prescribed medication to treat this infection.  -LEFT HIP FRACTURE, STATUS POST HIP REPLACEMENT: You are recovering from a recent hip replacement surgery and are currently undergoing physical therapy. Continue with your physical therapy sessions as scheduled.  -ANEMIA, UNDER EVALUATION: Your anemia worsened during your recent hospital stay. We have ordered lab tests to recheck your blood count to monitor your condition.  -OSTEOPOROSIS, SUSPECTED: Due to your recent hip fracture, we suspect you may have osteoporosis, a condition that weakens bones. We have ordered a bone density study and will refer you to an osteoporosis clinic for further evaluation and treatment.  -PANCREATIC MASS, UNDER EVALUATION: A pancreatic mass was identified on a prior CT scan. We need to reschedule an MRI of your abdomen for further evaluation.  -UTERINE MASS, UNDER EVALUATION: A uterine mass was identified on a prior CT scan. We need to reschedule an MRI of your abdomen for further evaluation.  INSTRUCTIONS:  Please take the prescribed antibiotics and Miralax  as directed. Continue with  your physical therapy sessions. We will contact you to reschedule the MRI of your abdomen. If your abdominal pain worsens, seek emergency care immediately. Follow up with the lab tests to recheck your blood count as ordered.

## 2024-03-19 ENCOUNTER — Ambulatory Visit: Payer: Self-pay | Admitting: Internal Medicine

## 2024-03-19 ENCOUNTER — Encounter: Payer: Self-pay | Admitting: Internal Medicine

## 2024-03-19 LAB — CBC WITH DIFFERENTIAL/PLATELET
Basophils Absolute: 0.1 x10E3/uL (ref 0.0–0.2)
Basos: 1 %
EOS (ABSOLUTE): 0.1 x10E3/uL (ref 0.0–0.4)
Eos: 1 %
Hematocrit: 26.3 % — ABNORMAL LOW (ref 34.0–46.6)
Hemoglobin: 8.3 g/dL — ABNORMAL LOW (ref 11.1–15.9)
Immature Grans (Abs): 0 x10E3/uL (ref 0.0–0.1)
Immature Granulocytes: 0 %
Lymphocytes Absolute: 1.3 x10E3/uL (ref 0.7–3.1)
Lymphs: 19 %
MCH: 30.9 pg (ref 26.6–33.0)
MCHC: 31.6 g/dL (ref 31.5–35.7)
MCV: 98 fL — ABNORMAL HIGH (ref 79–97)
Monocytes Absolute: 0.6 x10E3/uL (ref 0.1–0.9)
Monocytes: 9 %
Neutrophils Absolute: 4.7 x10E3/uL (ref 1.4–7.0)
Neutrophils: 70 %
Platelets: 430 x10E3/uL (ref 150–450)
RBC: 2.69 x10E6/uL — CL (ref 3.77–5.28)
RDW: 12.2 % (ref 11.7–15.4)
WBC: 6.8 x10E3/uL (ref 3.4–10.8)

## 2024-03-21 LAB — CERVICOVAGINAL ANCILLARY ONLY
Bacterial Vaginitis (gardnerella): NEGATIVE
Candida Glabrata: POSITIVE — AB
Candida Vaginitis: NEGATIVE
Chlamydia: NEGATIVE
Comment: NEGATIVE
Comment: NEGATIVE
Comment: NEGATIVE
Comment: NEGATIVE
Comment: NEGATIVE
Comment: NORMAL
Neisseria Gonorrhea: NEGATIVE
Trichomonas: NEGATIVE

## 2024-03-22 ENCOUNTER — Inpatient Hospital Stay: Admitting: Family Medicine

## 2024-03-23 MED ORDER — BORIC ACID 600 MG VA SUPP: 600.0000 mg | Freq: Every day | VAGINAL | 0 refills | Status: AC

## 2024-03-28 ENCOUNTER — Telehealth: Payer: Self-pay | Admitting: Internal Medicine

## 2024-03-28 NOTE — Telephone Encounter (Signed)
 Copied from CRM #[id number]. Topic: Referral - Request for Referral >> Mar 28, 2024  1:52 PM Larissa RAMAN wrote:  Did the patient discuss referral with their provider in the last year? Yes (If No - schedule appointment) (If Yes - send message)  Appointment offered? No  Type of order/referral and detailed reason for visit: Home health services  [facility]  If referral order, have you been seen by this specialty before? Yes (If Yes, this issue or another issue? When? Where?  Can we respond through MyChart? No   ----------------------------------------------------------------------- From previous Reason for Contact - Other: Reason for CRM: [facility].

## 2024-03-29 NOTE — Telephone Encounter (Signed)
 I called the patient back and had to leave a message requesting she return my call.    She is most likely referring to [facility] and needs to call [facility] : [phone] to request that [facility] provide her services if she has been approved for the services.

## 2024-03-31 NOTE — Telephone Encounter (Signed)
 I spoke to the patient and provided her with the phone number for [facility].  She had a referral placed to [facility] last year and stated she has received services.  I told her to please call me back if she has any questions or if [facility] needs any additional information and she said she would

## 2024-04-05 ENCOUNTER — Ambulatory Visit: Payer: Self-pay | Admitting: *Deleted

## 2024-04-05 ENCOUNTER — Other Ambulatory Visit: Payer: Self-pay | Admitting: Internal Medicine

## 2024-04-05 DIAGNOSIS — I10 Essential (primary) hypertension: Secondary | ICD-10-CM

## 2024-04-05 NOTE — Telephone Encounter (Signed)
 noted

## 2024-04-05 NOTE — Telephone Encounter (Signed)
 FYI Only or Action Required?: FYI only for provider.  Patient was last seen in primary care on 03/18/2024 by Vicci Barnie NOVAK, MD.  Called Nurse Triage reporting Hip Pain (Post op pain).  Symptoms began several weeks ago.  Interventions attempted: Rest, hydration, or home remedies.  Symptoms are: unchanged.  Triage Disposition: Call Specialist Today  Patient/caregiver understands and will follow disposition?: Yes   Reason for Disposition  [1] Caller has NON-URGENT question AND [2] triager unable to answer question    Post surgical appointment- 04/07/24- patient is out of pain medication- and having pain. Patient advised to contact Orthopedic surgeon to let them know her status and to help with her pain control. She will follow advice.  Answer Assessment - Initial Assessment Questions 1. SYMPTOM: What's the main symptom you're concerned about? (e.g., drainage, fever, incision opened up, pain, redness, swelling)     Pain and soreness 2. ONSET: When did pain  start?     02/26/24- it has been 6 weeks 3. DATE of SURGERY: When was the surgery?       02/26/24 4. INCISION SITE: Where is the incision located? (e.g., right or left side; buttocks area [posterior approach], front groin area [anterior approach], outer side of hip [lateral approach].      Left, side of hip 5. REDNESS: Is there any redness at the incision site? If Yes, ask: How wide across is the redness? (e.g., inches, centimeters)      no 6. PAIN: Is there any pain? If Yes, ask: Where is it located? (e.g., hip, thigh, calf; right or left side). How bad is it?  (Scale 1-10; or mild, moderate, severe)     Hip, 5/10 7. LEG SWELLING: Is there any leg swelling? If Yes, ask: Where is the swelling?  (e.g., right, left or both legs;  localized swelling just around incision, entire leg looks swollen, swollen area on thigh or calf). Is the swelling getting worse, staying the same, or getting better?     Still has  swelling- wound area 8. BLEEDING: Is there any bleeding? If Yes, ask: How much? and Where?     no 9. DRAINAGE: Is there any drainage from the incision site? If Yes, ask: What does it look like? (e.g., clear, cloudy, pink, red, yellow, pus). How much drainage is there? (e.g., drops, teaspoon)     no 10. FEVER: Do you have a fever? If Yes, ask: What is your temperature, how was it measured, and when did it start?       no 11. OTHER SYMPTOMS: Do you have any other symptoms? (e.g., dizziness, rash elsewhere on body, shaking chills, weakness)       no  Protocols used: Post-Op Total Hip Replacement Follow-up Call-A-[facility]   Copied from CRM #[id number]. Topic: Clinical - Red Word Triage >> Apr 05, 2024 11:28 AM Zy'onna H wrote: [facility] that prompted transfer to Nurse Triage: Patient is still having pain and soreness from recent hip procedure:  Chronic soreness and aching pain/persist in her hip   ** Also Patient is requesting Pain Rx  from recent Orthopedics Prodcedure (Hip Arthoplasty) Pain Rx Requesting:  *Oxycodone   * Muscle Relaxer   Patient was advised she wouldn't be able to take any Rx until 6 week postop - Now Surgeon: Edna Toribio LABOR, MD   Transf. To NT

## 2024-04-05 NOTE — Telephone Encounter (Signed)
 I called the patient to inquire if she contacted [facility] and was able to initiate [facility] and I had to leave a message requesting a call back.

## 2024-04-06 ENCOUNTER — Other Ambulatory Visit: Payer: Self-pay | Admitting: Internal Medicine

## 2024-04-07 NOTE — Telephone Encounter (Signed)
 I tried to reach the  patient again  to inquire if she contacted [facility] and was able to initiate [facility] and I had to leave a message requesting a call back.

## 2024-04-19 ENCOUNTER — Ambulatory Visit: Admitting: Neurology

## 2024-04-21 ENCOUNTER — Ambulatory Visit (HOSPITAL_COMMUNITY)

## 2024-04-21 ENCOUNTER — Ambulatory Visit (HOSPITAL_COMMUNITY)
Admission: EM | Admit: 2024-04-21 | Discharge: 2024-04-21 | Disposition: A | Discharge status: Home / Self Care | Attending: Internal Medicine | Admitting: Internal Medicine

## 2024-04-21 ENCOUNTER — Encounter (HOSPITAL_COMMUNITY): Payer: Self-pay

## 2024-04-21 ENCOUNTER — Ambulatory Visit

## 2024-04-21 DIAGNOSIS — M25512 Pain in left shoulder: Secondary | ICD-10-CM

## 2024-04-21 DIAGNOSIS — W19XXXA Unspecified fall, initial encounter: Secondary | ICD-10-CM

## 2024-04-21 DIAGNOSIS — M25511 Pain in right shoulder: Secondary | ICD-10-CM | POA: Diagnosis not present

## 2024-04-21 DIAGNOSIS — M25552 Pain in left hip: Secondary | ICD-10-CM

## 2024-04-21 NOTE — ED Triage Notes (Signed)
 Pt states she tripped and fell on Sunday landing on lt hip. States had lt hip replacement on 02/26/24. States unable to bare weight and has bruising. States incision site it intact with no drainage. Taking Tylenol  with short relief.

## 2024-04-21 NOTE — Discharge Instructions (Addendum)
 Your x-rays look great. No signs of broken bones.  Please take Tylenol  1000 mg every 6 hours as needed for hip pain.  You may apply ice to the left hip 20 minutes on 20 minutes off as needed and heat to the left hip 20 minutes on 20 minutes off as needed.  Please call your orthopedic surgeon for follow-up if you continue to have left hip pain in the next 2 to 3 days despite interventions listed above.  If you develop any new or worsening symptoms or if your symptoms do not start to improve, please return here or follow-up with your primary care provider. If your symptoms are severe, please go to the emergency room.

## 2024-04-21 NOTE — ED Provider Notes (Signed)
 [facility]    CSN: [id number] Arrival date & time: 04/21/24  1647      History   Chief Complaint Chief Complaint  Patient presents with   Fall    HPI Cyndel Renleigh Ouellet is a 68 y.o. female.   Towanda Laikynn Pollio is a 68 y.o. female presenting for chief complaint of left hip pain that started as a result of fall that happened on Sunday, April 17, 2024.  Patient was walking when she accidentally stumbled and tripped over her own 2 feet causing her to fall backwards.  She caught herself by stretching out her arms behind her and landed onto her left hip.  She did not hit her head during the fall and denies loss of consciousness, nausea/vomiting after fall, headache, and preceding dizziness/chest pain/heart palpitations.  She recently had left hip replacement on February 26, 2024.  She has had significant pain to the left upper leg/left hip with walking after falling 5 days ago.  She has not noticed any bruising to the left upper leg and denies numbness and tingling to the left leg.  Denies low back pain, saddle anesthesia, loss of bowel/bladder control, and low back pain.  She is not experiencing any neck pain or unilateral extremity weakness.  She is taking Tylenol  for pain with some relief.   Fall    Past Medical History:  Diagnosis Date   Acid reflux    Anxiety    Arthritis    COPD (chronic obstructive pulmonary disease) (HCC)    Depression    Dyspnea    Hepatitis    Hypertension     Patient Active Problem List   Diagnosis Date Noted   Protein-calorie malnutrition, severe 02/27/2024   Closed left hip fracture, initial encounter (HCC) 02/26/2024   Moderately severe major depression (HCC) 08/20/2023   Primary osteoarthritis of left knee 06/24/2023   Normocytic anemia 04/08/2023   Loss of weight 04/08/2023   Vomiting without nausea 04/08/2023   Altered bowel habits 04/08/2023   Primary hyperparathyroidism 03/26/2023   Arthritis of carpometacarpal  (CMC) joint of left thumb 04/23/2022   Pain in left shoulder 04/23/2022   Hypercalcemia 12/31/2021   Stage 3b chronic kidney disease (HCC) 12/31/2021   Allergic rhinitis 10/24/2020   Rotator cuff arthropathy of left shoulder 08/07/2020   Cervicalgia 08/07/2020   Chronic cough 03/21/2020   SIADH (syndrome of inappropriate ADH production) 01/30/2020   Tobacco dependence 01/30/2020   Diarrhea of infectious origin 01/30/2020   Hyponatremia 01/13/2020   Alcohol abuse 01/13/2020   Tobacco abuse 01/13/2020   Chronic diarrhea 01/13/2020   Hypophosphatemia 01/13/2020   Hypertension    Chronic obstructive pulmonary disease (HCC)    Alcohol use    Hypokalemia 01/12/2020   Tobacco use 10/06/2019   Unilateral primary osteoarthritis, left knee 09/08/2019   Pain in right shoulder 08/11/2019    Past Surgical History:  Procedure Laterality Date   COLONOSCOPY WITH PROPOFOL  N/A 06/07/2014   Procedure: COLONOSCOPY WITH PROPOFOL ;  Surgeon: Elsie Cree, MD;  Location: [facility];  Service: Endoscopy;  Laterality: N/A;   ESOPHAGOGASTRODUODENOSCOPY (EGD) WITH PROPOFOL  N/A 06/07/2014   Procedure: ESOPHAGOGASTRODUODENOSCOPY (EGD) WITH PROPOFOL ;  Surgeon: Elsie Cree, MD;  Location: [facility];  Service: Endoscopy;  Laterality: N/A;   TOTAL HIP ARTHROPLASTY Left 02/26/2024   Procedure: TOTAL HIP ARTHROPLASTY, POSTERIOR APPROACH;  Surgeon: Edna Toribio LABOR, MD;  Location: [facility];  Service: Orthopedics;  Laterality: Left;   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   Procedure: TOTAL KNEE  ARTHROPLASTY;  Surgeon: Edna Toribio LABOR, MD;  Location: [facility];  Service: Orthopedics;  Laterality: Left;    OB History   No obstetric history on file.      Home Medications    Prior to Admission medications   Medication Sig Start Date End Date Taking? Authorizing Provider  albuterol  (PROVENTIL ) (2.5 MG/3ML) 0.083% nebulizer solution inhale THE contents of 1 vial PER nebulizer EVERY 6 HOURS AS NEEDED  FOR wheezing OR SHORTNESS OF BREATH 12/31/21   Vicci Barnie NOVAK, MD  albuterol  (VENTOLIN  HFA) 108 [phone] Base) MCG/ACT inhaler INHALE 2 puffs BY MOUTH EVERY 6 HOURS AS NEEDED FOR wheezing OR SHORTNESS OF BREATH 02/12/24   Vicci Barnie NOVAK, MD  amLODipine  (NORVASC ) 10 MG tablet TAKE 1 Tablet BY MOUTH ONCE DAILY FOR BLOOD PRESSURE 04/05/24   Vicci Barnie NOVAK, MD  atorvastatin  (LIPITOR ) 80 MG tablet TAKE 1 Tablet BY MOUTH ONCE DAILY 04/05/24   Vicci Barnie NOVAK, MD  Boric Acid 600 MG SUPP Place 600 mg vaginally daily. 03/23/24   Vicci Barnie NOVAK, MD  cyanocobalamin  (VITAMIN B12) 500 MCG tablet Take 1 tablet (500 mcg total) by mouth daily. 03/02/24   Christobal Guadalajara, MD  ferrous sulfate  325 (65 FE) MG tablet Take 1 tablet (325 mg total) by mouth daily with breakfast. Patient to pay out of pocket if not covered by her insurance. Patient taking differently: Take 325 mg by mouth 3 (three) times a week. Take one tablet by mouth on Mon-Tues-Weds. 05/14/23   Vicci Barnie NOVAK, MD  fluconazole  (DIFLUCAN ) 150 MG tablet Take 1 tablet (150 mg total) by mouth daily. 03/18/24   Vicci Barnie NOVAK, MD  hydrALAZINE  (APRESOLINE ) 10 MG tablet TAKE TWO TABLETS BY MOUTH IN THE MORNING AND at bedtime (STOP avapro  (irbesartan )) 03/14/24   Vicci Barnie NOVAK, MD  metroNIDAZOLE  (FLAGYL ) 500 MG tablet Take 1 tablet (500 mg total) by mouth 2 (two) times daily. 03/18/24   Vicci Barnie NOVAK, MD  mometasone -formoterol  (DULERA ) 100-5 MCG/ACT AERO INHALE 2 PUFFS INTO THE LUNGS TWICE DAILY 03/14/24   Vicci Barnie NOVAK, MD  pantoprazole  (PROTONIX ) 40 MG tablet TAKE 1 Tablet BY MOUTH TWICE DAILY 04/06/24   Vicci Barnie NOVAK, MD  polyethylene glycol powder (MIRALAX ) 17 GM/SCOOP powder Take 17 g by mouth daily as needed for moderate constipation. Dissolve 1 capful (17g) in 4-8 ounces of liquid and take by mouth daily. 03/18/24   Vicci Barnie NOVAK, MD  sertraline  (ZOLOFT ) 50 MG tablet TAKE 1 Tablet BY MOUTH ONCE DAILY 04/06/24   Newlin, Enobong, MD   tiotropium (SPIRIVA  HANDIHALER) 18 MCG inhalation capsule PLACE 1 CAPSULE INTO INHALER AND INHALE DAILY 08/20/23   Vicci Barnie NOVAK, MD  traMADol  (ULTRAM ) 50 MG tablet Take 1 tablet (50 mg total) by mouth every 8 (eight) hours as needed. 04/22/24   Theotis Haze ORN, NP  Vitamin D , Cholecalciferol , 10 MCG (400 UNIT) CAPS Take 400 Int'l Units by mouth daily. 03/26/23   Vicci Barnie NOVAK, MD  fluticasone  [facility]) 50 MCG/ACT nasal spray Place 1 spray into both nostrils daily.  05/12/19  [provider]  lovastatin (MEVACOR) 20 MG tablet Take 20 mg by mouth daily at 6 PM.  05/12/19  [provider]    Family History Family History  Problem Relation Age of Onset   Kidney failure Mother    Aneurysm Father     Social History Social History   Tobacco Use   Smoking status: Every Day    Current packs/day: 0.25  Types: Cigarettes   Smokeless tobacco: Never   Tobacco comments:    3 times a week - 10/24/2020    Buproprion helps  Vaping Use   Vaping status: Never Used  Substance Use Topics   Alcohol use: Yes    Comment: occasionally   Drug use: Yes    Types: Marijuana     Allergies   Penicillins and Chantix [varenicline tartrate]   Review of Systems Review of Systems Per HPI  Physical Exam Triage Vital Signs ED Triage Vitals  Encounter Vitals Group     BP 04/21/24 1724 138/75     Girls Systolic BP Percentile --      Girls Diastolic BP Percentile --      Boys Systolic BP Percentile --      Boys Diastolic BP Percentile --      Pulse Rate 04/21/24 1724 90     Resp 04/21/24 1724 18     Temp 04/21/24 1724 99 F (37.2 C)     Temp Source 04/21/24 1724 Oral     SpO2 04/21/24 1724 100 %     Weight --      Height --      Head Circumference --      Peak Flow --      Pain Score 04/21/24 1723 5     Pain Loc --      Pain Education --      Exclude from Growth Chart --    No data found.  Updated Vital Signs BP 138/75 (BP Location: Left Arm)   Pulse 90    Temp 99 F (37.2 C) (Oral)   Resp 18   SpO2 100%   Visual Acuity Right Eye Distance:   Left Eye Distance:   Bilateral Distance:    Right Eye Near:   Left Eye Near:    Bilateral Near:     Physical Exam Vitals and nursing note reviewed.  Constitutional:      Appearance: She is not ill-appearing or toxic-appearing.  HENT:     Head: Normocephalic and atraumatic.     Right Ear: Hearing and external ear normal.     Left Ear: Hearing and external ear normal.     Nose: Nose normal.     Mouth/Throat:     Lips: Pink.  Eyes:     General: Lids are normal. Vision grossly intact. Gaze aligned appropriately.     Extraocular Movements: Extraocular movements intact.     Conjunctiva/sclera: Conjunctivae normal.  Cardiovascular:     Rate and Rhythm: Normal rate and regular rhythm.     Heart sounds: Normal heart sounds, S1 normal and S2 normal.  Pulmonary:     Effort: Pulmonary effort is normal. No respiratory distress.     Breath sounds: Normal breath sounds and air entry.  Musculoskeletal:     Cervical back: Neck supple.     Right hip: Normal.     Left hip: Tenderness and bony tenderness present. No deformity, lacerations or crepitus. Decreased range of motion. Normal strength.     Comments: 5/5 strength against resistance to bilateral lower extremities, sensation intact bilateral lower extremities.  Well-healing and nondehisced/well-approximated surgical incision to the left lateral hip without bruising.  Mild soft tissue swelling.    Skin:    General: Skin is warm and dry.     Capillary Refill: Capillary refill takes less than 2 seconds.     Findings: No rash.  Neurological:     General: No focal deficit present.  Mental Status: She is alert and oriented to person, place, and time. Mental status is at baseline.     Cranial Nerves: No dysarthria or facial asymmetry.  Psychiatric:        Mood and Affect: Mood normal.        Speech: Speech normal.        Behavior: Behavior normal.         Thought Content: Thought content normal.        Judgment: Judgment normal.      UC Treatments / Results  Labs (all labs ordered are listed, but only abnormal results are displayed) Labs Reviewed - No data to display  EKG   Radiology No results found.   Procedures Procedures (including critical care time)  Medications Ordered in UC Medications - No data to display  Initial Impression / Assessment and Plan / UC Course  I have reviewed the triage vital signs and the nursing notes.  Pertinent labs & imaging results that were available during my care of the patient were reviewed by me and considered in my medical decision making (see chart for details).   1. Fall, left hip pain, bilateral shoulder pain Mechanical fall resulting in bilateral shoulder and left hip pain.  Left hip x-rays and bilateral shoulder x-rays are unremarkable for acute bony abnormality. Patient amble to ambulate with cane to her baseline with pain, therefore in wheelchair for today's visit.  Neurovascularly intact to distal bilateral lower extremities without limb shortening/rotation. Continue tylenol  PRN at home. Heat/ice as needed.  Prefer to avoid sedating medications such as muscle relaxer for [place] due to fall risk.  Recommend follow-up with PCP/ortho as needed.   Counseled patient on potential for adverse effects with medications prescribed/recommended today, strict ER and return-to-clinic precautions discussed, patient verbalized understanding.    Final Clinical Impressions(s) / UC Diagnoses   Final diagnoses:  Left hip pain  Fall, initial encounter  Acute pain of both shoulders     Discharge Instructions      Your x-rays look great. No signs of broken bones.  Please take Tylenol  1000 mg every 6 hours as needed for hip pain.  You may apply ice to the left hip 20 minutes on 20 minutes off as needed and heat to the left hip 20 minutes on 20 minutes off as needed.  Please call  your orthopedic surgeon for follow-up if you continue to have left hip pain in the next 2 to 3 days despite interventions listed above.  If you develop any new or worsening symptoms or if your symptoms do not start to improve, please return here or follow-up with your primary care provider. If your symptoms are severe, please go to the emergency room.      ED Prescriptions   None    PDMP not reviewed this encounter.   Enedelia Dorna HERO, [place] 04/24/24 [phone]

## 2024-04-22 ENCOUNTER — Ambulatory Visit: Payer: Self-pay

## 2024-04-22 ENCOUNTER — Encounter: Payer: Self-pay | Admitting: Nurse Practitioner

## 2024-04-22 ENCOUNTER — Ambulatory Visit: Attending: Nurse Practitioner | Admitting: Nurse Practitioner

## 2024-04-22 VITALS — BP 165/92 | HR 71 | Wt 117.0 lb

## 2024-04-22 DIAGNOSIS — M25552 Pain in left hip: Secondary | ICD-10-CM

## 2024-04-22 MED ORDER — TRAMADOL HCL 50 MG PO TABS: 50.0000 mg | ORAL_TABLET | Freq: Three times a day (TID) | ORAL | 0 refills | Status: AC | PRN

## 2024-04-22 NOTE — Progress Notes (Signed)
 Assessment & Plan:  Lalisa was seen today for fall.  Diagnoses and all orders for this visit:  Left hip pain -     traMADol  (ULTRAM ) 50 MG tablet; Take 1 tablet (50 mg total) by mouth every 8 (eight) hours as needed. Temporary, courtesy fill only  As there are no fractures noted on Xray, if pain persists I recommend ortho referral.    Patient has been counseled on age-appropriate routine health concerns for screening and prevention. These are reviewed and up-to-date. Referrals have been placed accordingly. Immunizations are up-to-date or declined.    Subjective:   Chief Complaint  Patient presents with   Anna Mcguire 68 y.o. female presents to office today s/p fall. She is a patient of Dr Vicci  Ms. Geurin was evaluated in the ER on 04-21-2024 with complaints of Left hip pain after fallin 4 days prior. She reportedly had tripped over her own feet causing her to fall backwards. Per ED report: She caught herself by stretching out her arms behind her and landed onto her left hip.  She did not hit her head during the fall and denies loss of consciousness, nausea/vomiting after fall, headache, and preceding dizziness/chest pain/heart palpitations.  She recently had left hip replacement on February 26, 2024.  She has had significant pain to the left upper leg/left hip with walking after falling 5 days ago.  She has not noticed any bruising to the left upper leg and denies numbness and tingling to the left leg.  Denies low back pain, saddle anesthesia, loss of bowel/bladder control, and low back pain.  She is not experiencing any neck pain or unilateral extremity weakness.  She is taking Tylenol  for pain with some relief. Xrays were unremarkable for any acute abnormalities. She was discharged and instructed to take tylenol  and use heat/ice as needed.   She was also instructed to follow up with Ortho if symptoms persisted.  Today she states her hip pain has not improved with  Tylenol , heat/ice application and rest.      -    BP Readings from Last 3 Encounters:  04/22/24 (!) 165/92  04/21/24 138/75  03/18/24 (!) 129/54    Review of Systems  Constitutional:  Negative for fever, malaise/fatigue and weight loss.  HENT: Negative.  Negative for nosebleeds.   Eyes: Negative.  Negative for blurred vision, double vision and photophobia.  Respiratory: Negative.  Negative for cough and shortness of breath.   Cardiovascular: Negative.  Negative for chest pain, palpitations and leg swelling.  Gastrointestinal: Negative.  Negative for heartburn, nausea and vomiting.  Musculoskeletal:  Positive for joint pain. Negative for myalgias.  Neurological: Negative.  Negative for dizziness, focal weakness, seizures and headaches.  Psychiatric/Behavioral: Negative.  Negative for suicidal ideas.     Past Medical History:  Diagnosis Date   Acid reflux    Anxiety    Arthritis    COPD (chronic obstructive pulmonary disease) (HCC)    Depression    Dyspnea    Hepatitis    Hypertension     Past Surgical History:  Procedure Laterality Date   COLONOSCOPY WITH PROPOFOL  N/A 06/07/2014   Procedure: COLONOSCOPY WITH PROPOFOL ;  Surgeon: Elsie Cree, MD;  Location: [facility];  Service: Endoscopy;  Laterality: N/A;   ESOPHAGOGASTRODUODENOSCOPY (EGD) WITH PROPOFOL  N/A 06/07/2014   Procedure: ESOPHAGOGASTRODUODENOSCOPY (EGD) WITH PROPOFOL ;  Surgeon: Elsie Cree, MD;  Location: [facility];  Service: Endoscopy;  Laterality: N/A;   TOTAL HIP ARTHROPLASTY Left 02/26/2024  Procedure: TOTAL HIP ARTHROPLASTY, POSTERIOR APPROACH;  Surgeon: Edna Toribio LABOR, MD;  Location: [facility];  Service: Orthopedics;  Laterality: Left;   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   Procedure: TOTAL KNEE ARTHROPLASTY;  Surgeon: Edna Toribio LABOR, MD;  Location: [facility];  Service: Orthopedics;  Laterality: Left;    Family History  Problem Relation Age of Onset   Kidney failure Mother     Aneurysm Father     Social History Reviewed with no changes to be made today.   Outpatient Medications Prior to Visit  Medication Sig Dispense Refill   albuterol  (PROVENTIL ) (2.5 MG/3ML) 0.083% nebulizer solution inhale THE contents of 1 vial PER nebulizer EVERY 6 HOURS AS NEEDED FOR wheezing OR SHORTNESS OF BREATH 150 mL 5   albuterol  (VENTOLIN  HFA) 108 (90 Base) MCG/ACT inhaler INHALE 2 puffs BY MOUTH EVERY 6 HOURS AS NEEDED FOR wheezing OR SHORTNESS OF BREATH 8.5 g 5   amLODipine  (NORVASC ) 10 MG tablet TAKE 1 Tablet BY MOUTH ONCE DAILY FOR BLOOD PRESSURE 90 tablet 1   atorvastatin  (LIPITOR ) 80 MG tablet TAKE 1 Tablet BY MOUTH ONCE DAILY 90 tablet 1   Boric Acid 600 MG SUPP Place 600 mg vaginally daily. 14 suppository 0   cyanocobalamin  (VITAMIN B12) 500 MCG tablet Take 1 tablet (500 mcg total) by mouth daily.     ferrous sulfate  325 (65 FE) MG tablet Take 1 tablet (325 mg total) by mouth daily with breakfast. Patient to pay out of pocket if not covered by her insurance. (Patient taking differently: Take 325 mg by mouth 3 (three) times a week. Take one tablet by mouth on Mon-Tues-Weds.) 100 tablet 1   fluconazole  (DIFLUCAN ) 150 MG tablet Take 1 tablet (150 mg total) by mouth daily. 1 tablet 0   hydrALAZINE  (APRESOLINE ) 10 MG tablet TAKE TWO TABLETS BY MOUTH IN THE MORNING AND at bedtime (STOP avapro  (irbesartan )) 360 tablet 0   metroNIDAZOLE  (FLAGYL ) 500 MG tablet Take 1 tablet (500 mg total) by mouth 2 (two) times daily. 14 tablet 0   mometasone -formoterol  (DULERA ) 100-5 MCG/ACT AERO INHALE 2 PUFFS INTO THE LUNGS TWICE DAILY 13 g 2   pantoprazole  (PROTONIX ) 40 MG tablet TAKE 1 Tablet BY MOUTH TWICE DAILY 60 tablet 2   polyethylene glycol powder (MIRALAX ) 17 GM/SCOOP powder Take 17 g by mouth daily as needed for moderate constipation. Dissolve 1 capful (17g) in 4-8 ounces of liquid and take by mouth daily. 238 g 2   sertraline  (ZOLOFT ) 50 MG tablet TAKE 1 Tablet BY MOUTH ONCE DAILY 90 tablet 1    tiotropium (SPIRIVA  HANDIHALER) 18 MCG inhalation capsule PLACE 1 CAPSULE INTO INHALER AND INHALE DAILY 90 capsule PRN   Vitamin D , Cholecalciferol , 10 MCG (400 UNIT) CAPS Take 400 Int'l Units by mouth daily. 100 capsule 1   No facility-administered medications prior to visit.    Allergies  Allergen Reactions   Penicillins Hives, Diarrhea and Itching   Chantix [Varenicline Tartrate] Other (See Comments)    Diarrhea and dizziness       Objective:    BP (!) 165/92   Pulse 71   Wt 117 lb (53.1 kg)   SpO2 100%   BMI 18.88 kg/m  Wt Readings from Last 3 Encounters:  04/22/24 117 lb (53.1 kg)  03/18/24 116 lb (52.6 kg)  02/26/24 114 lb 13.8 oz (52.1 kg)    Physical Exam Vitals and nursing note reviewed.  Constitutional:      Appearance: She is well-developed.  HENT:  Head: Normocephalic and atraumatic.  Cardiovascular:     Rate and Rhythm: Normal rate and regular rhythm.     Heart sounds: Normal heart sounds. No murmur heard.    No friction rub. No gallop.  Pulmonary:     Effort: Pulmonary effort is normal. No tachypnea or respiratory distress.     Breath sounds: Normal breath sounds. No decreased breath sounds, wheezing, rhonchi or rales.  Chest:     Chest wall: No tenderness.  Musculoskeletal:        General: Normal range of motion.     Cervical back: Normal range of motion.     Left hip: Tenderness present.  Skin:    General: Skin is warm and dry.  Neurological:     Mental Status: She is alert and oriented to person, place, and time.     Coordination: Coordination normal.  Psychiatric:        Behavior: Behavior normal. Behavior is cooperative.        Thought Content: Thought content normal.        Judgment: Judgment normal.          Patient has been counseled extensively about nutrition and exercise as well as the importance of adherence with medications and regular follow-up. The patient was given clear instructions to go to ER or return to medical  center if symptoms don't improve, worsen or new problems develop. The patient verbalized understanding.   Follow-up: Return if symptoms worsen or fail to improve.   Haze LELON Servant, FNP-BC [facility] [place], [place] [phone]   05/07/2024, 11:48 PM

## 2024-04-22 NOTE — Telephone Encounter (Signed)
 FYI Only or Action Required?: FYI only for provider.  Patient was last seen in primary care on 03/18/2024 by Vicci Barnie NOVAK, MD.  Called Nurse Triage reporting Fall and Pain.  Symptoms began several days ago.  Interventions attempted: OTC medications: Tylenol ; ice (can't tolerate the cold and has stopped).  Symptoms are: left hip pain, swelling and bruising 1-2 inches, neck pain and bilateral shoulder pain, lower back pain gradually worsening.  Triage Disposition: See Physician Within 24 Hours  Patient/caregiver understands and will follow disposition?: Yes         Copied from CRM #[id number]. Topic: Clinical - Red Word Triage >> Apr 22, 2024 12:02 PM Nathanel BROCKS wrote: Red Word that prompted transfer to Nurse Triage: pt fell on Sunday and went to urgent care and she is still in pain and needs muscle relaxers. Reason for Disposition  [1] MODERATE pain (e.g., interferes with normal activities, limping) AND [2] high-risk adult (e.g., age > 60 years, osteoporosis, chronic steroid use)  Answer Assessment - Initial Assessment Questions 1. MECHANISM: How did the injury happen? (e.g., twisting injury, direct blow)      Trip and fall at home Sunday. She was trying to get to the bathroom and tripped on her shoes. Landed on right shoulder and buttocks.   2. ONSET: When did the injury happen? (e.g., minutes, hours ago)      Sunday 04/17/24.  3. LOCATION: Where is the injury located?      Lower back, both shoulders, left hip.  4. APPEARANCE of INJURY: What does the injury look like?  (e.g., deformity of leg)     Bruising and swelling on left hip.  5. SEVERITY: Can you put weight on that leg? Can you walk?      She states she can walk and bear weight. She states she has a hard time with balancing and painful to bear weight/walk. 6. SIZE: For cuts, bruises, or swelling, ask: How large is it? (e.g., inches or centimeters;  entire joint)      1-2 inches of swelling and  bruising.  7. PAIN: Is there pain? If Yes, ask: How bad is the pain?   What does it keep you from doing? (Scale 0-10; or none, mild, moderate, severe)     Yes. 5/10.  8. TETANUS: For any breaks in the skin, ask: When was your last tetanus booster?     N/A.  9. OTHER SYMPTOMS: Do you have any other symptoms?      Lower back pain, neck pain radiating to bilateral shoulder pain. Denies numbness, weakness, problems with bladder or bowel control, LOC, head injury, neck stiffness, nausea, vomiting.  Protocols used: Hip Injury-A-AH

## 2024-05-03 NOTE — Telephone Encounter (Signed)
 I called the patient to inquire if she ever contacted [facility] about [facility] and she said she has not called.  I explained to her that she needs to contact them if she is still interested in the services and she said she understood and also said that she has the phone number for [facility]

## 2024-05-07 NOTE — Progress Notes (Incomplete)
 Assessment & Plan:  Anna Mcguire was seen today for fall.  Diagnoses and all orders for this visit:  Left hip pain -     traMADol  (ULTRAM ) 50 MG tablet; Take 1 tablet (50 mg total) by mouth every 8 (eight) hours as needed.    Patient has been counseled on age-appropriate routine health concerns for screening and prevention. These are reviewed and up-to-date. Referrals have been placed accordingly. Immunizations are up-to-date or declined.    Subjective:   Chief Complaint  Patient presents with  . Fall    Anna Mcguire 68 y.o. female presents to office today for fall   BP Readings from Last 3 Encounters:  04/22/24 (!) 165/92  04/21/24 138/75  03/18/24 (!) 129/54    ROS  Past Medical History:  Diagnosis Date  . Acid reflux   . Anxiety   . Arthritis   . COPD (chronic obstructive pulmonary disease) (HCC)   . Depression   . Dyspnea   . Hepatitis   . Hypertension     Past Surgical History:  Procedure Laterality Date  . COLONOSCOPY WITH PROPOFOL  N/A 06/07/2014   Procedure: COLONOSCOPY WITH PROPOFOL ;  Surgeon: Elsie Cree, MD;  [facility];  Service: Endoscopy;  Laterality: N/A;  . ESOPHAGOGASTRODUODENOSCOPY (EGD) WITH PROPOFOL  N/A 06/07/2014   Procedure: ESOPHAGOGASTRODUODENOSCOPY (EGD) WITH PROPOFOL ;  Surgeon: Elsie Cree, MD;  [facility];  Service: Endoscopy;  Laterality: N/A;  . TOTAL HIP ARTHROPLASTY Left 02/26/2024   Procedure: TOTAL HIP ARTHROPLASTY, POSTERIOR APPROACH;  Surgeon: Edna Toribio LABOR, MD;  [facility];  Service: Orthopedics;  Laterality: Left;  . 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   Procedure: TOTAL KNEE ARTHROPLASTY;  Surgeon: Edna Toribio LABOR, MD;  [facility];  Service: Orthopedics;  Laterality: Left;    Family History  Problem Relation Age of Onset  . Kidney failure Mother   . Aneurysm Father     Social History Reviewed with no changes to be made today.   Outpatient Medications Prior to  Visit  Medication Sig Dispense Refill  . albuterol  (PROVENTIL ) (2.5 MG/3ML) 0.083% nebulizer solution inhale THE contents of 1 vial PER nebulizer EVERY 6 HOURS AS NEEDED FOR wheezing OR SHORTNESS OF BREATH 150 mL 5  . albuterol  (VENTOLIN  HFA) 108 (90 Base) MCG/ACT inhaler INHALE 2 puffs BY MOUTH EVERY 6 HOURS AS NEEDED FOR wheezing OR SHORTNESS OF BREATH 8.5 g 5  . amLODipine  (NORVASC ) 10 MG tablet TAKE 1 Tablet BY MOUTH ONCE DAILY FOR BLOOD PRESSURE 90 tablet 1  . atorvastatin  (LIPITOR ) 80 MG tablet TAKE 1 Tablet BY MOUTH ONCE DAILY 90 tablet 1  . Boric Acid 600 MG SUPP Place 600 mg vaginally daily. 14 suppository 0  . cyanocobalamin  (VITAMIN B12) 500 MCG tablet Take 1 tablet (500 mcg total) by mouth daily.    . ferrous sulfate  325 (65 FE) MG tablet Take 1 tablet (325 mg total) by mouth daily with breakfast. Patient to pay out of pocket if not covered by her insurance. (Patient taking differently: Take 325 mg by mouth 3 (three) times a week. Take one tablet by mouth on Mon-Tues-Weds.) 100 tablet 1  . fluconazole  (DIFLUCAN ) 150 MG tablet Take 1 tablet (150 mg total) by mouth daily. 1 tablet 0  . hydrALAZINE  (APRESOLINE ) 10 MG tablet TAKE TWO TABLETS BY MOUTH IN THE MORNING AND at bedtime (STOP avapro  (irbesartan )) 360 tablet 0  . metroNIDAZOLE  (FLAGYL ) 500 MG tablet Take 1 tablet (500 mg total) by mouth 2 (two) times daily.  14 tablet 0  . mometasone -formoterol  (DULERA ) 100-5 MCG/ACT AERO INHALE 2 PUFFS INTO THE LUNGS TWICE DAILY 13 g 2  . pantoprazole  (PROTONIX ) 40 MG tablet TAKE 1 Tablet BY MOUTH TWICE DAILY 60 tablet 2  . polyethylene glycol powder (MIRALAX ) 17 GM/SCOOP powder Take 17 g by mouth daily as needed for moderate constipation. Dissolve 1 capful (17g) in 4-8 ounces of liquid and take by mouth daily. 238 g 2  . sertraline  (ZOLOFT ) 50 MG tablet TAKE 1 Tablet BY MOUTH ONCE DAILY 90 tablet 1  . tiotropium (SPIRIVA  HANDIHALER) 18 MCG inhalation capsule PLACE 1 CAPSULE INTO INHALER AND INHALE  DAILY 90 capsule PRN  . Vitamin D , Cholecalciferol , 10 MCG (400 UNIT) CAPS Take 400 Int'l Units by mouth daily. 100 capsule 1   No facility-administered medications prior to visit.    Allergies  Allergen Reactions  . Penicillins Hives, Diarrhea and Itching  . Chantix [Varenicline Tartrate] Other (See Comments)    Diarrhea and dizziness       Objective:    BP (!) 165/92   Pulse 71   Wt 117 lb (53.1 kg)   SpO2 100%   BMI 18.88 kg/m  Wt Readings from Last 3 Encounters:  04/22/24 117 lb (53.1 kg)  03/18/24 116 lb (52.6 kg)  02/26/24 114 lb 13.8 oz (52.1 kg)    Physical Exam       Patient has been counseled extensively about nutrition and exercise as well as the importance of adherence with medications and regular follow-up. The patient was given clear instructions to go to ER or return to medical center if symptoms don't improve, worsen or new problems develop. The patient verbalized understanding.   Follow-up: Return if symptoms worsen or fail to improve.   Haze LELON Servant, FNP-BC [facility] [place], [place] [phone]   05/07/2024, 11:48 PM

## 2024-05-08 ENCOUNTER — Encounter: Payer: Self-pay | Admitting: Nurse Practitioner

## 2024-05-09 ENCOUNTER — Ambulatory Visit: Admitting: Internal Medicine

## 2024-05-11 NOTE — Telephone Encounter (Signed)
 I called patient and her daughter, Camelia, answered.  I explained that her mother missed her appointment with Dr Vicci on Monday, 11/3 and she said she was aware of that, her mother was not feeling well.  It was very difficult to hear [name] as there was a lot of static in the connection. I explained to her that Dr Vicci wants to make sure her mother gets some imaging done that has been ordered.  [name] said she could help with the scheduling but she was on her way to work and asked that I call her back tomorrow after 1200 and I said I would call her

## 2024-05-12 NOTE — Telephone Encounter (Signed)
 I tried to reach patient's daughter, [name]:  [phone]  and had to leave a message requesting a call back. I also tried the number on file for her : [phone] and the recording stated that the call cannot be completed at this time.   I want to give her the number for [facility] to schedule the imaging studies that have been ordered for her mother: [phone]

## 2024-05-17 ENCOUNTER — Ambulatory Visit: Admitting: Neurology

## 2024-05-17 ENCOUNTER — Encounter: Payer: Self-pay | Admitting: Neurology

## 2024-05-17 VITALS — BP 168/71 | HR 76 | Resp 17 | Ht 66.0 in

## 2024-05-17 DIAGNOSIS — M542 Cervicalgia: Secondary | ICD-10-CM | POA: Diagnosis not present

## 2024-05-17 DIAGNOSIS — R202 Paresthesia of skin: Secondary | ICD-10-CM | POA: Insufficient documentation

## 2024-05-17 DIAGNOSIS — G8929 Other chronic pain: Secondary | ICD-10-CM | POA: Insufficient documentation

## 2024-05-17 DIAGNOSIS — R269 Unspecified abnormalities of gait and mobility: Secondary | ICD-10-CM | POA: Insufficient documentation

## 2024-05-17 DIAGNOSIS — M5441 Lumbago with sciatica, right side: Secondary | ICD-10-CM

## 2024-05-17 NOTE — Progress Notes (Signed)
 Chief Complaint  Patient presents with   New Patient (Initial Visit)    Rm14, alone, New patient here to discuss bilateral hand numbness, coldness. Right foot numbness      ASSESSMENT AND PLAN  Anna Mcguire is a 68 y.o. 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   MEDICAL HISTORY:  Anna Mcguire is a 68 year old female, seen in request by her primary care doctor Vicci Sober for evaluation of bilateral upper extremity paresthesia, gait abnormality, initial evaluation May 17, 2024   History is obtained from the patient and review of electronic medical records. I personally reviewed pertinent available imaging films in PACS.   PMHx of  HTN COPD HLD Depression, anxiety Smoker, Drink, cut down since 2024, Left knee and hip replacement  She had a history of left knee, hip replacement, but still have significant left side joints pain, gait abnormality, has been reliant on a walker or cane over past 3 years  Since 2025, she began to noticed slow worsening bilateral hands numbness, feeling cold all the time, could not pick up things, bottom of the right foot also feel numb, slow worsening gait abnormality, frequent nocturnal urination, but no incontinence,  She also complains of chronic neck, low back pain,  She lives alone at home, rely on transportation or her daughter for doctors appointment  PHYSICAL EXAM:   Vitals:   05/17/24 0911 05/17/24 0916  BP: (!) 167/73 (!) 168/71  Pulse: 76   Resp: 17   SpO2: 98%   Height: 5' 6 (1.676 m)    Body mass index is  18.88 kg/m.  PHYSICAL EXAMNIATION:  Gen: NAD, conversant, well nourised, well groomed                     Cardiovascular: Regular rate rhythm, no peripheral edema, warm, nontender. Eyes: Conjunctivae clear without exudates or hemorrhage Neck: Supple, no carotid bruits. Pulmonary: Clear to auscultation bilaterally   NEUROLOGICAL EXAM:  MENTAL STATUS: Speech/cognition: Thin, awake, alert, oriented to history taking and casual conversation CRANIAL NERVES: CN II: Visual fields are full to confrontation. Pupils are round equal and briskly reactive to light. CN III, IV, VI: extraocular movement are normal. No ptosis. CN V: Facial sensation is intact to light touch CN VII: Face is symmetric with normal eye closure  CN VIII: Hearing is normal to causal conversation. CN IX, X: Phonation is normal. CN XI: Head turning and shoulder shrug are intact  MOTOR: There is no pronator drift of out-stretched arms. Muscle bulk and tone are normal. Muscle strength is normal.  REFLEXES: Reflexes are 2+ and symmetric at the biceps, triceps, 3/3 knees, and trace ankles. Plantar responses are extensor bilaterally  SENSORY: Intact to light touch, pinprick and vibratory sensation are intact in fingers and toes.  COORDINATION: There is no trunk or limb dysmetria noted.  GAIT/STANCE: Pushing up, reliant on her cane, antalgic, unsteady  REVIEW OF SYSTEMS:  Full 14 system review of systems performed and notable only for as above All other review of systems were negative.   ALLERGIES: Allergies  Allergen Reactions   Penicillins Hives, Diarrhea and Itching   Penicillin  V Diarrhea, Hives and Itching    Other Reaction(s): Unknown   Varenicline Diarrhea    Other Reaction(s): Diarrhea (code- 37684991, SNOMED CT); Dizziness (code- 595359996, SNOMED CT)   Chantix [Varenicline Tartrate] Other (See Comments)    Diarrhea and dizziness    HOME MEDICATIONS: Current Outpatient Medications  Medication Sig  Dispense Refill   albuterol  (PROVENTIL ) (2.5 MG/3ML) 0.083% nebulizer solution inhale THE contents of 1 vial PER nebulizer EVERY 6 HOURS AS NEEDED FOR wheezing OR SHORTNESS OF BREATH 150 mL 5   albuterol  (VENTOLIN  HFA) 108 (90 Base) MCG/ACT inhaler INHALE 2 puffs BY MOUTH EVERY 6 HOURS AS NEEDED FOR wheezing OR SHORTNESS OF BREATH 8.5 g 5   amLODipine  (NORVASC ) 10 MG tablet TAKE 1 Tablet BY MOUTH ONCE DAILY FOR BLOOD PRESSURE 90 tablet 1   atorvastatin  (LIPITOR ) 80 MG tablet TAKE 1 Tablet BY MOUTH ONCE DAILY 90 tablet 1   Boric Acid 600 MG SUPP Place 600 mg vaginally daily. 14 suppository 0   cyanocobalamin  (VITAMIN B12) 500 MCG tablet Take 1 tablet (500 mcg total) by mouth daily.     ferrous sulfate  325 (65 FE) MG tablet Take 1 tablet (325 mg total) by mouth daily with breakfast. Patient to pay out of pocket if not covered by her insurance. (Patient taking differently: Take 325 mg by mouth 3 (three) times a week. Take one tablet by mouth on Mon-Tues-Weds.) 100 tablet 1   fluconazole  (DIFLUCAN ) 150 MG tablet Take 1 tablet (150 mg total) by mouth daily. 1 tablet 0   hydrALAZINE  (APRESOLINE ) 10 MG tablet TAKE TWO TABLETS BY MOUTH IN THE MORNING AND at bedtime (STOP avapro  (irbesartan )) 360 tablet 0   metroNIDAZOLE  (FLAGYL ) 500 MG tablet Take 1 tablet (500 mg total) by mouth 2 (two) times daily. 14 tablet 0   mometasone -formoterol  (DULERA ) 100-5 MCG/ACT AERO INHALE 2 PUFFS INTO THE LUNGS TWICE DAILY 13 g 2   pantoprazole  (PROTONIX ) 40 MG tablet TAKE 1 Tablet BY MOUTH TWICE DAILY 60 tablet 2   polyethylene glycol powder (MIRALAX ) 17 GM/SCOOP powder Take 17 g by mouth daily as needed for moderate constipation. Dissolve 1 capful (17g) in 4-8 ounces of liquid and take by mouth daily. 238 g 2   sertraline  (ZOLOFT ) 50 MG tablet TAKE 1 Tablet BY MOUTH ONCE DAILY 90 tablet 1   tiotropium (SPIRIVA  HANDIHALER) 18 MCG inhalation capsule PLACE 1 CAPSULE INTO INHALER AND INHALE DAILY 90 capsule PRN   traMADol   (ULTRAM ) 50 MG tablet Take 1 tablet (50 mg total) by mouth every 8 (eight) hours as needed. 30 tablet 0   Vitamin D , Cholecalciferol , 10 MCG (400 UNIT) CAPS Take 400 Int'l Units by mouth daily. 100 capsule 1   No current facility-administered medications for this visit.    PAST MEDICAL HISTORY: Past Medical History:  Diagnosis Date   Acid reflux    Anxiety    Arthritis    COPD (chronic obstructive pulmonary disease) (HCC)    Depression    Dyspnea    Hepatitis    Hypertension     PAST SURGICAL HISTORY: Past Surgical History:  Procedure Laterality Date   COLONOSCOPY WITH PROPOFOL  N/A 06/07/2014   Procedure: COLONOSCOPY WITH PROPOFOL ;  Surgeon: Elsie Cree, MD;  [facility];  Service: Endoscopy;  Laterality: N/A;   ESOPHAGOGASTRODUODENOSCOPY (EGD) WITH PROPOFOL  N/A 06/07/2014   Procedure: ESOPHAGOGASTRODUODENOSCOPY (EGD) WITH PROPOFOL ;  Surgeon: Elsie Cree, MD;  [facility];  Service: Endoscopy;  Laterality: N/A;   TOTAL HIP ARTHROPLASTY Left 02/26/2024  Procedure: TOTAL HIP ARTHROPLASTY, POSTERIOR APPROACH;  Surgeon: Edna Toribio LABOR, MD;  [facility];  Service: Orthopedics;  Laterality: Left;   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   Procedure: TOTAL KNEE ARTHROPLASTY;  Surgeon: Edna Toribio LABOR, MD;  [facility];  Service: Orthopedics;  Laterality: Left;    FAMILY HISTORY: Family History  Problem Relation Age of Onset   Kidney failure Mother    Aneurysm Father     SOCIAL HISTORY: Social History   Socioeconomic History   Marital status: Single    Spouse name: Not on file   Number of children: Not on file   Years of education: Not on file   Highest education level: Not on file  Occupational History   Not on file  Tobacco Use   Smoking status: Every Day    Current packs/day: 0.25    Types: Cigarettes   Smokeless tobacco: Never   Tobacco comments:    3 times a week - 10/24/2020    Buproprion helps  Vaping Use   Vaping  status: Never Used  Substance and Sexual Activity   Alcohol use: Yes    Comment: occasionally   Drug use: Yes    Types: Marijuana   Sexual activity: Not on file  Other Topics Concern   Not on file  Social History Narrative   Retired   Chief Executive Officer Drivers of Corporate Investment Banker Strain: Low Risk  (12/15/2023)   Overall Financial Resource Strain (CARDIA)    Difficulty of Paying Living Expenses: Not hard at all  Food Insecurity: Food Insecurity Present (02/26/2024)   Hunger Vital Sign    Worried About Running Out of Food in the Last Year: Sometimes true    Ran Out of Food in the Last Year: Sometimes true  Transportation Needs: No Transportation Needs (02/26/2024)   PRAPARE - Administrator, Civil Service (Medical): No    Lack of Transportation (Non-Medical): No  Physical Activity: Inactive (12/15/2023)   Exercise Vital Sign    Days of Exercise per Week: 0 days    Minutes of Exercise per Session: 0 min  Stress: No Stress Concern Present (12/15/2023)   [employer] of Occupational Health - Occupational Stress Questionnaire    Feeling of Stress : Not at all  Social Connections: Socially Isolated (02/26/2024)   Social Connection and Isolation Panel    Frequency of Communication with Friends and Family: More than three times a week    Frequency of Social Gatherings with Friends and Family: More than three times a week    Attends Religious Services: Never    Database Administrator or Organizations: No    Attends Banker Meetings: Never    Marital Status: Never married  Intimate Partner Violence: Not At Risk (02/26/2024)   Humiliation, Afraid, Rape, and Kick questionnaire    Fear of Current or Ex-Partner: No    Emotionally Abused: No    Physically Abused: No    Sexually Abused: No      Modena Callander, M.D. Ph.D.  [facility] [address] [place], [place] [address] Ph: [phone] Fax: [phone]  CC:  Vicci Barnie NOVAK, MD [address] [place],  [place] [address]  Vicci Barnie NOVAK, MD

## 2024-05-19 ENCOUNTER — Telehealth: Payer: Self-pay | Admitting: Neurology

## 2024-05-19 NOTE — Telephone Encounter (Signed)
 no auth required sent to [facility] [phone]

## 2024-05-25 ENCOUNTER — Other Ambulatory Visit: Payer: Self-pay | Admitting: Internal Medicine

## 2024-05-25 DIAGNOSIS — I10 Essential (primary) hypertension: Secondary | ICD-10-CM

## 2024-06-10 ENCOUNTER — Telehealth: Admitting: "Endocrinology

## 2024-06-21 ENCOUNTER — Other Ambulatory Visit: Payer: Self-pay | Admitting: Internal Medicine

## 2024-06-21 DIAGNOSIS — I10 Essential (primary) hypertension: Secondary | ICD-10-CM

## 2024-06-21 DIAGNOSIS — J449 Chronic obstructive pulmonary disease, unspecified: Secondary | ICD-10-CM

## 2024-06-21 NOTE — Telephone Encounter (Signed)
 Copied from CRM #[id number]. Topic: Clinical - Medication Refill >> Jun 21, 2024  2:20 PM Rosaria E wrote: Medication:  pantoprazole  (PROTONIX ) 40 MG tablet albuterol  (VENTOLIN  HFA) 108 (90 Base) MCG/ACT inhaler amLODipine  (NORVASC ) 10 MG tablet  Has the patient contacted their pharmacy? Yes (Agent: If no, request that the patient contact the pharmacy for the refill. If patient does not wish to contact the pharmacy document the reason why and proceed with request.) (Agent: If yes, when and what did the pharmacy advise?)  This is the patient's preferred pharmacy:  [facility] [place], [place] - [address] [place] [address] Phone: [phone] Fax: [phone]  Is this the correct pharmacy for this prescription? Yes If no, delete pharmacy and type the correct one.   Has the prescription been filled recently? Yes  Is the patient out of the medication? Yes  Has the patient been seen for an appointment in the last year OR does the patient have an upcoming appointment? Yes  Can we respond through MyChart? Yes  Agent: Please be advised that Rx refills may take up to 3 business days. We ask that you follow-up with your pharmacy.

## 2024-06-23 NOTE — Telephone Encounter (Signed)
 I spoke to patient's daughter, Camelia, and  informed her that Dr Vicci placed orders for imaging studies-  MRIs, mammogram- months ago that still need to be done.  I explained to [name] that she needs to call [facility] to schedule those procedures.  I gave her the phone number to call and she said she will call   I also told her that Dr Vicci would like to see her mother because she missed her last scheduled appointment ( 05/09/2024).  I was able to schedule her with Dr Vicci 07/21/2024 @ 0930 and provided [name] with that appointment information.  She said if she is not able to accompany her to the appointment, she will arrange a ride.

## 2024-06-24 MED ORDER — AMLODIPINE BESYLATE 10 MG PO TABS: 10.0000 mg | ORAL_TABLET | Freq: Every day | ORAL | 0 refills | Status: DC

## 2024-06-24 MED ORDER — PANTOPRAZOLE SODIUM 40 MG PO TBEC: 40.0000 mg | DELAYED_RELEASE_TABLET | Freq: Two times a day (BID) | ORAL | 0 refills | Status: AC

## 2024-06-24 MED ORDER — ALBUTEROL SULFATE HFA 108 (90 BASE) MCG/ACT IN AERS: 2.0000 | INHALATION_SPRAY | Freq: Four times a day (QID) | RESPIRATORY_TRACT | 0 refills | Status: AC | PRN

## 2024-06-24 NOTE — Telephone Encounter (Signed)
 Requested Prescriptions  Pending Prescriptions Disp Refills   pantoprazole  (PROTONIX ) 40 MG tablet 180 tablet 0    Sig: Take 1 tablet (40 mg total) by mouth 2 (two) times daily.     Gastroenterology: Proton Pump Inhibitors Passed - 06/24/2024 11:55 AM      Passed - Valid encounter within last 12 months    Recent Outpatient Visits           2 months ago Left hip pain   [facility] [place]. [facility] Theotis Haze ORN, NP   3 months ago Left lower quadrant abdominal pain   [facility] [place] [facility] [place]. [facility] Vicci Barnie NOVAK, MD   5 months ago Essential hypertension   [facility] [place]. [facility] Vicci Barnie NOVAK, MD   10 months ago Essential hypertension   [facility] [place]. [facility] Vicci Barnie NOVAK, MD   1 year ago Primary osteoarthritis of left knee   [facility] [place]. [facility] Vicci Barnie NOVAK, MD       Future Appointments             In 2 months Komal, Motwani, MD [facility]             albuterol  (VENTOLIN  HFA) 108 [phone] Base) MCG/ACT inhaler 8.5 g 0    Sig: Inhale 2 puffs into the lungs every 6 (six) hours as needed for wheezing or shortness of breath.     Pulmonology:  Beta Agonists 2 Failed - 06/24/2024 11:55 AM      Failed - Last BP in normal range    BP Readings from Last 1 Encounters:  05/17/24 (!) 168/71         Passed - Last Heart Rate in normal range    Pulse Readings from Last 1 Encounters:  05/17/24 76         Passed - Valid encounter within last 12 months    Recent Outpatient Visits           2 months ago Left hip pain   [facility] [place] [place]. [facility] Theotis Haze ORN, NP   3 months ago Left lower quadrant abdominal pain   [facility]  [place] [facility] [place]. [facility] Vicci Barnie NOVAK, MD   5 months ago Essential hypertension   [facility] [place] [facility] [place]. [facility] Vicci Barnie NOVAK, MD   10 months ago Essential hypertension   [facility] [facility] [place] [facility] [place]. [facility] Vicci Barnie NOVAK, MD   1 year ago Primary osteoarthritis of left knee    [facility] Of . [facility] Vicci Barnie NOVAK, MD       Future Appointments             In 2 months Obadiah Birmingham, MD [facility] Endocrinology             amLODipine  (NORVASC ) 10 MG tablet 90 tablet 0    Sig: Take 1 tablet (10 mg total) by mouth daily. for blood pressure  Cardiovascular: Calcium  Channel Blockers 2 Failed - 06/24/2024 11:55 AM      Failed - Last BP in normal range    BP Readings from Last 1 Encounters:  05/17/24 (!) 168/71         Passed - Last Heart Rate in normal range    Pulse Readings from Last 1 Encounters:  05/17/24 76         Passed - Valid encounter within last 6 months    Recent Outpatient Visits           2 months ago Left hip pain   [facility] [place] [place]. [facility] Theotis Haze ORN, NP   3 months ago Left lower quadrant abdominal pain   [facility] [place] [facility] [place]. [facility] Vicci Barnie NOVAK, MD   5 months ago Essential hypertension   [place] [facility] [place] [place]. [facility] Vicci Barnie NOVAK, MD   10 months ago Essential hypertension   [facility] [place]. [facility] Vicci Barnie NOVAK, MD   1 year ago Primary osteoarthritis of left knee   [place] [facility] [facility] [place] [facility] Of [place]. [facility] Vicci Barnie NOVAK, MD       Future Appointments             In 2 months Obadiah Birmingham, MD [facility] Endocrinology

## 2024-07-04 ENCOUNTER — Other Ambulatory Visit: Payer: Self-pay | Admitting: Internal Medicine

## 2024-07-04 DIAGNOSIS — I10 Essential (primary) hypertension: Secondary | ICD-10-CM

## 2024-07-04 MED ORDER — AMLODIPINE BESYLATE 10 MG PO TABS: 10.0000 mg | ORAL_TABLET | Freq: Every day | ORAL | 0 refills | Status: AC

## 2024-07-04 NOTE — Telephone Encounter (Signed)
 Copied from CRM #[id number]. Topic: Clinical - Medication Refill >> Jul 04, 2024  3:29 PM Berwyn MATSU wrote: Medication: amLODipine  (NORVASC ) 10 MG tablet; patient states did not receive medication and pharmacy either.    Has the patient contacted their pharmacy? Yes (Agent: If no, request that the patient contact the pharmacy for the refill. If patient does not wish to contact the pharmacy document the reason why and proceed with request.) (Agent: If yes, when and what did the pharmacy advise?)  This is the patient's preferred pharmacy:  [facility] - [place], [place] - [address] [place] [address] Phone: [phone] Fax: [phone]  Is this the correct pharmacy for this prescription? Yes If no, delete pharmacy and type the correct one.   Has the prescription been filled recently? Yes  Is the patient out of the medication? Yes  Has the patient been seen for an appointment in the last year OR does the patient have an upcoming appointment? Yes  Can we respond through MyChart? Yes   Agent: Please be advised that Rx refills may take up to 3 business days. We ask that you follow-up with your pharmacy.

## 2024-07-21 ENCOUNTER — Ambulatory Visit: Payer: Self-pay | Admitting: Internal Medicine

## 2024-07-25 ENCOUNTER — Ambulatory Visit: Admitting: Podiatry

## 2024-09-21 ENCOUNTER — Ambulatory Visit: Admitting: "Endocrinology

## 2024-12-20 ENCOUNTER — Encounter
